# Patient Record
Sex: Female | Born: 1955 | Race: White | Hispanic: No | Smoking: Never smoker
Health system: Southern US, Community
[De-identification: ages and names within clinical notes are randomized; demographics above are authoritative.]

## PROBLEM LIST (undated history)

## (undated) DIAGNOSIS — N289 Disorder of kidney and ureter, unspecified: Secondary | ICD-10-CM

## (undated) DIAGNOSIS — E785 Hyperlipidemia, unspecified: Secondary | ICD-10-CM

## (undated) DIAGNOSIS — I10 Essential (primary) hypertension: Secondary | ICD-10-CM

## (undated) DIAGNOSIS — L0390 Cellulitis, unspecified: Secondary | ICD-10-CM

## (undated) DIAGNOSIS — F329 Major depressive disorder, single episode, unspecified: Secondary | ICD-10-CM

## (undated) DIAGNOSIS — J302 Other seasonal allergic rhinitis: Secondary | ICD-10-CM

## (undated) DIAGNOSIS — F32A Depression, unspecified: Secondary | ICD-10-CM

## (undated) DIAGNOSIS — E119 Type 2 diabetes mellitus without complications: Secondary | ICD-10-CM

## (undated) HISTORY — DX: Hyperlipidemia, unspecified: E78.5

## (undated) HISTORY — PX: BACK SURGERY: SHX140

## (undated) HISTORY — PX: LUMBAR FUSION: SHX111

## (undated) HISTORY — DX: Essential (primary) hypertension: I10

## (undated) HISTORY — DX: Depression, unspecified: F32.A

## (undated) HISTORY — PX: BREAST SURGERY: SHX581

## (undated) HISTORY — DX: Major depressive disorder, single episode, unspecified: F32.9

---

## 1997-08-22 ENCOUNTER — Ambulatory Visit (HOSPITAL_COMMUNITY): Admission: RE | Admit: 1997-08-22 | Discharge: 1997-08-22 | Payer: Self-pay | Admitting: Neurosurgery

## 1998-02-11 ENCOUNTER — Ambulatory Visit (HOSPITAL_COMMUNITY): Admission: RE | Admit: 1998-02-11 | Discharge: 1998-02-11 | Payer: Self-pay

## 1998-05-06 ENCOUNTER — Ambulatory Visit (HOSPITAL_COMMUNITY): Admission: RE | Admit: 1998-05-06 | Discharge: 1998-05-06 | Payer: Self-pay | Admitting: Gastroenterology

## 1998-06-03 ENCOUNTER — Encounter: Payer: Self-pay | Admitting: Family Medicine

## 1998-06-03 ENCOUNTER — Ambulatory Visit (HOSPITAL_COMMUNITY): Admission: RE | Admit: 1998-06-03 | Discharge: 1998-06-03 | Payer: Self-pay | Admitting: Family Medicine

## 1999-01-24 ENCOUNTER — Encounter: Payer: Self-pay | Admitting: Family Medicine

## 1999-01-24 ENCOUNTER — Ambulatory Visit (HOSPITAL_COMMUNITY): Admission: RE | Admit: 1999-01-24 | Discharge: 1999-01-24 | Payer: Self-pay | Admitting: Family Medicine

## 1999-03-11 ENCOUNTER — Encounter: Payer: Self-pay | Admitting: Allergy and Immunology

## 1999-03-11 ENCOUNTER — Encounter: Admission: RE | Admit: 1999-03-11 | Discharge: 1999-03-11 | Payer: Self-pay | Admitting: Allergy and Immunology

## 1999-03-23 ENCOUNTER — Encounter: Admission: RE | Admit: 1999-03-23 | Discharge: 1999-03-23 | Payer: Self-pay | Admitting: Allergy and Immunology

## 1999-03-23 ENCOUNTER — Encounter: Payer: Self-pay | Admitting: Allergy and Immunology

## 2002-06-27 ENCOUNTER — Encounter: Admission: RE | Admit: 2002-06-27 | Discharge: 2002-06-27 | Payer: Self-pay | Admitting: Obstetrics and Gynecology

## 2002-06-27 ENCOUNTER — Other Ambulatory Visit: Admission: RE | Admit: 2002-06-27 | Discharge: 2002-06-27 | Payer: Self-pay | Admitting: Obstetrics and Gynecology

## 2002-06-27 ENCOUNTER — Encounter: Payer: Self-pay | Admitting: Obstetrics and Gynecology

## 2002-09-16 ENCOUNTER — Emergency Department (HOSPITAL_COMMUNITY): Admission: EM | Admit: 2002-09-16 | Discharge: 2002-09-16 | Payer: Self-pay | Admitting: Emergency Medicine

## 2002-09-16 ENCOUNTER — Encounter: Payer: Self-pay | Admitting: Emergency Medicine

## 2003-01-17 ENCOUNTER — Emergency Department (HOSPITAL_COMMUNITY): Admission: EM | Admit: 2003-01-17 | Discharge: 2003-01-17 | Payer: Self-pay | Admitting: Emergency Medicine

## 2004-04-13 ENCOUNTER — Encounter: Admission: RE | Admit: 2004-04-13 | Discharge: 2004-04-13 | Payer: Self-pay | Admitting: *Deleted

## 2006-09-18 ENCOUNTER — Emergency Department (HOSPITAL_COMMUNITY): Admission: EM | Admit: 2006-09-18 | Discharge: 2006-09-18 | Payer: Self-pay | Admitting: Family Medicine

## 2006-10-25 LAB — CONVERTED CEMR LAB: Pap Smear: NORMAL

## 2006-10-31 ENCOUNTER — Encounter (INDEPENDENT_AMBULATORY_CARE_PROVIDER_SITE_OTHER): Payer: Self-pay | Admitting: General Surgery

## 2006-10-31 ENCOUNTER — Ambulatory Visit (HOSPITAL_BASED_OUTPATIENT_CLINIC_OR_DEPARTMENT_OTHER): Admission: RE | Admit: 2006-10-31 | Discharge: 2006-10-31 | Payer: Self-pay | Admitting: Internal Medicine

## 2007-08-16 ENCOUNTER — Emergency Department (HOSPITAL_COMMUNITY): Admission: EM | Admit: 2007-08-16 | Discharge: 2007-08-16 | Payer: Self-pay | Admitting: Family Medicine

## 2007-08-31 ENCOUNTER — Emergency Department (HOSPITAL_COMMUNITY): Admission: EM | Admit: 2007-08-31 | Discharge: 2007-08-31 | Payer: Self-pay | Admitting: Family Medicine

## 2008-02-06 ENCOUNTER — Ambulatory Visit: Payer: Self-pay | Admitting: Family Medicine

## 2008-02-06 DIAGNOSIS — E785 Hyperlipidemia, unspecified: Secondary | ICD-10-CM | POA: Insufficient documentation

## 2008-02-06 DIAGNOSIS — I10 Essential (primary) hypertension: Secondary | ICD-10-CM | POA: Insufficient documentation

## 2008-02-06 DIAGNOSIS — F321 Major depressive disorder, single episode, moderate: Secondary | ICD-10-CM | POA: Insufficient documentation

## 2008-02-08 ENCOUNTER — Encounter (INDEPENDENT_AMBULATORY_CARE_PROVIDER_SITE_OTHER): Payer: Self-pay | Admitting: *Deleted

## 2008-02-08 LAB — CONVERTED CEMR LAB
ALT: 39 units/L — ABNORMAL HIGH (ref 0–35)
AST: 24 units/L (ref 0–37)
Albumin: 4.4 g/dL (ref 3.5–5.2)
Alkaline Phosphatase: 90 units/L (ref 39–117)
BUN: 13 mg/dL (ref 6–23)
Bilirubin, Direct: 0.1 mg/dL (ref 0.0–0.3)
CO2: 25 meq/L (ref 19–32)
Calcium: 11 mg/dL — ABNORMAL HIGH (ref 8.4–10.5)
Chloride: 104 meq/L (ref 96–112)
Cholesterol: 441 mg/dL — ABNORMAL HIGH (ref 0–200)
Creatinine, Ser: 0.81 mg/dL (ref 0.40–1.20)
Glucose, Bld: 93 mg/dL (ref 70–99)
HCT: 41.2 % (ref 36.0–46.0)
HDL: 52 mg/dL (ref >39)
Hemoglobin: 13.8 g/dL (ref 12.0–15.0)
Indirect Bilirubin: 0.4 mg/dL (ref 0.0–0.9)
MCHC: 33.5 g/dL (ref 30.0–36.0)
MCV: 86 fL (ref 78.0–100.0)
Platelets: 234 10*3/uL (ref 150–400)
Potassium: 3.8 meq/L (ref 3.5–5.3)
RBC: 4.79 M/uL (ref 3.87–5.11)
RDW: 13.6 % (ref 11.5–15.5)
Sodium: 140 meq/L (ref 135–145)
TSH: 1.061 microintl units/mL (ref 0.350–4.50)
Total Bilirubin: 0.5 mg/dL (ref 0.3–1.2)
Total CHOL/HDL Ratio: 8.5
Total Protein: 7.2 g/dL (ref 6.0–8.3)
Triglycerides: 404 mg/dL — ABNORMAL HIGH (ref <150)
WBC: 4.7 10*3/uL (ref 4.0–10.5)

## 2008-03-28 ENCOUNTER — Telehealth (INDEPENDENT_AMBULATORY_CARE_PROVIDER_SITE_OTHER): Payer: Self-pay | Admitting: *Deleted

## 2009-02-26 ENCOUNTER — Telehealth: Payer: Self-pay | Admitting: Family Medicine

## 2009-03-17 ENCOUNTER — Ambulatory Visit: Payer: Self-pay | Admitting: Family Medicine

## 2009-04-10 ENCOUNTER — Ambulatory Visit: Payer: Self-pay | Admitting: Family Medicine

## 2010-02-19 ENCOUNTER — Telehealth (INDEPENDENT_AMBULATORY_CARE_PROVIDER_SITE_OTHER): Payer: Self-pay | Admitting: *Deleted

## 2010-02-24 ENCOUNTER — Ambulatory Visit: Payer: Self-pay | Admitting: Family Medicine

## 2010-03-26 ENCOUNTER — Ambulatory Visit: Admit: 2010-03-26 | Payer: Self-pay | Admitting: Family Medicine

## 2010-03-26 ENCOUNTER — Other Ambulatory Visit: Payer: Self-pay | Admitting: Family Medicine

## 2010-03-26 ENCOUNTER — Ambulatory Visit
Admission: RE | Admit: 2010-03-26 | Discharge: 2010-03-26 | Payer: Self-pay | Source: Home / Self Care | Attending: Family Medicine | Admitting: Family Medicine

## 2010-03-26 LAB — BASIC METABOLIC PANEL
BUN: 18 mg/dL (ref 6–23)
CO2: 29 mEq/L (ref 19–32)
Calcium: 11.6 mg/dL — ABNORMAL HIGH (ref 8.4–10.5)
Chloride: 104 mEq/L (ref 96–112)
Creatinine, Ser: 0.8 mg/dL (ref 0.4–1.2)
GFR: 82.8 mL/min (ref >60.00)
Glucose, Bld: 94 mg/dL (ref 70–99)
Potassium: 5 mEq/L (ref 3.5–5.1)
Sodium: 140 mEq/L (ref 135–145)

## 2010-03-26 LAB — HEPATIC FUNCTION PANEL
ALT: 27 U/L (ref 0–35)
AST: 20 U/L (ref 0–37)
Albumin: 4.1 g/dL (ref 3.5–5.2)
Alkaline Phosphatase: 96 U/L (ref 39–117)
Bilirubin, Direct: 0.1 mg/dL (ref 0.0–0.3)
Total Bilirubin: 0.5 mg/dL (ref 0.3–1.2)
Total Protein: 7.3 g/dL (ref 6.0–8.3)

## 2010-03-26 LAB — CBC WITH DIFFERENTIAL/PLATELET
Basophils Absolute: 0 10*3/uL (ref 0.0–0.1)
Basophils Relative: 0.5 % (ref 0.0–3.0)
Eosinophils Absolute: 0.1 10*3/uL (ref 0.0–0.7)
Eosinophils Relative: 2.2 % (ref 0.0–5.0)
HCT: 39.6 % (ref 36.0–46.0)
Hemoglobin: 13.5 g/dL (ref 12.0–15.0)
Lymphocytes Relative: 16.7 % (ref 12.0–46.0)
Lymphs Abs: 1 10*3/uL (ref 0.7–4.0)
MCHC: 34.1 g/dL (ref 30.0–36.0)
MCV: 87 fl (ref 78.0–100.0)
Monocytes Absolute: 0.5 10*3/uL (ref 0.1–1.0)
Monocytes Relative: 7.4 % (ref 3.0–12.0)
Neutro Abs: 4.5 10*3/uL (ref 1.4–7.7)
Neutrophils Relative %: 73.2 % (ref 43.0–77.0)
Platelets: 216 10*3/uL (ref 150.0–400.0)
RBC: 4.55 Mil/uL (ref 3.87–5.11)
RDW: 14.1 % (ref 11.5–14.6)
WBC: 6.2 10*3/uL (ref 4.5–10.5)

## 2010-03-26 LAB — LIPID PANEL
Cholesterol: 505 mg/dL — ABNORMAL HIGH (ref 0–200)
HDL: 74.2 mg/dL (ref >39.00)
Total CHOL/HDL Ratio: 7
Triglycerides: 482 mg/dL — ABNORMAL HIGH (ref 0.0–149.0)
VLDL: 96.4 mg/dL — ABNORMAL HIGH (ref 0.0–40.0)

## 2010-03-26 LAB — LDL CHOLESTEROL, DIRECT: Direct LDL: 176.6 mg/dL

## 2010-03-26 LAB — TSH: TSH: 1.89 u[IU]/mL (ref 0.35–5.50)

## 2010-03-31 ENCOUNTER — Encounter: Payer: Self-pay | Admitting: Family Medicine

## 2010-04-04 ENCOUNTER — Encounter: Payer: Self-pay | Admitting: *Deleted

## 2010-04-07 ENCOUNTER — Telehealth (INDEPENDENT_AMBULATORY_CARE_PROVIDER_SITE_OTHER): Payer: Self-pay | Admitting: *Deleted

## 2010-04-09 ENCOUNTER — Telehealth (INDEPENDENT_AMBULATORY_CARE_PROVIDER_SITE_OTHER): Payer: Self-pay | Admitting: *Deleted

## 2010-04-13 NOTE — Assessment & Plan Note (Signed)
Summary: new pt to est--blood pressure medication-per michelle--tl   Vital Signs:  Patient Profile:   55 Years Old Female Weight:      225.13 pounds Temp:     98.2 degrees F oral Pulse rate:   94 / minute BP sitting:   160 / 120  (left arm)  Pt. in pain?   no  Vitals Entered By: Rolla Flatten CMA (February 06, 2008 12:08 PM)                  PCP:  Etter Sjogren  Chief Complaint:  bp med.  History of Present Illness:  Hypertension Follow-Up      This is a 55 year old woman who presents for Hypertension follow-up.  The patient denies lightheadedness, urinary frequency, headaches, edema, impotence, rash, and fatigue.  Associated symptoms include chest pain.  The patient denies the following associated symptoms: chest pressure, exercise intolerance, dyspnea, palpitations, syncope, leg edema, and pedal edema.  Compliance with medications (by patient report) has been near 100%.  The patient reports that dietary compliance has been good.  The patient reports no exercise.  Adjunctive measures currently used by the patient include salt restriction.  Pt states when she does not take meds she has CP.   Pt also needs fasting labs and refills on all meds.     Current Allergies (reviewed today): No known allergies   Past Medical History:    Reviewed history and no changes required:       Hypertension       Depression       Hyperlipidemia  Past Surgical History:    Reviewed history and no changes required:       Lumbar fusion-- Dr Hal Neer X1       Lumpectomy-- L breast-- [facility]   Family History:    Family History Hypertension        Family History of Colon CA 1st degree relative at 55 yo--F  Social History:    Occupation: Oceanographer    Divorced    Never Smoked    Alcohol use-no    Drug use-no    Regular exercise-no   Risk Factors:  Tobacco use:  never Passive smoke exposure:  no Drug use:  no HIV high-risk behavior:  no Caffeine use:  1 drinks per  day Alcohol use:  no Exercise:  no Seatbelt use:  100 %  Family History Risk Factors:    Family History of MI in females < 37 years old:  no    Family History of MI in males < 67 years old:  no  Mammogram History:     Date of Last Mammogram:  10/25/2006    Results:  Normal ---bertrand   PAP Smear History:     Date of Last PAP Smear:  10/25/2006    Results:  Normal   Colonoscopy History:     Date of Last Colonoscopy:  08/26/1999    Results:  Magod---- polyps    Review of Systems      See HPI  ENT      Denies decreased hearing, difficulty swallowing, ear discharge, earache, hoarseness, nasal congestion, nosebleeds, postnasal drainage, ringing in ears, sinus pressure, and sore throat.  CV      occassional cp when off meds  Resp      Denies chest discomfort, chest pain with inspiration, cough, coughing up blood, excessive snoring, hypersomnolence, morning headaches, pleuritic, shortness of breath, sputum productive, and wheezing.  GI  Denies abdominal pain, bloody stools, change in bowel habits, constipation, dark tarry stools, diarrhea, excessive appetite, gas, hemorrhoids, indigestion, loss of appetite, nausea, vomiting, vomiting blood, and yellowish skin color.  GU      Denies abnormal vaginal bleeding, decreased libido, discharge, dysuria, genital sores, hematuria, incontinence, nocturia, urinary frequency, and urinary hesitancy.  MS      Denies joint pain, joint redness, joint swelling, loss of strength, low back pain, mid back pain, muscle aches, muscle , cramps, muscle weakness, stiffness, and thoracic pain.  Psych      Complains of anxiety.   Physical Exam  General:     Well-developed,well-nourished,in no acute distress; alert,appropriate and cooperative throughout examination Neck:     No deformities, masses, or tenderness noted.no cervical lymphadenopathy.   Lungs:     Normal respiratory effort, chest expands symmetrically. Lungs are clear to  auscultation, no crackles or wheezes. Heart:     Normal rate and regular rhythm. S1 and S2 normal without gallop, murmur, click, rub or other extra sounds. Extremities:     trace left pedal edema and trace right pedal edema.   Neurologic:     alert & oriented X3, cranial nerves II-XII intact, and strength normal in all extremities.   Skin:     Intact without suspicious lesions or rashes Cervical Nodes:     No lymphadenopathy noted Psych:     Oriented X3, memory intact for recent and remote, and normally interactive.      Impression & Recommendations:  Problem # 1:  HYPERTENSION (ICD-401.9)  Her updated medication list for this problem includes:    Atenolol 100 Mg Tabs (Atenolol) .Marland Kitchen... Take 1 tab once daily    Lisinopril-hydrochlorothiazide 10-12.5 Mg Tabs (Lisinopril-hydrochlorothiazide) .Marland Kitchen... 1 by mouth once daily  Orders: Venipuncture [id number]) TLB-Lipid Panel (80061-LIPID) TLB-BMP (Basic Metabolic Panel-BMET) (99991111) TLB-CBC Platelet - w/Differential (85025-CBCD) TLB-Hepatic/Liver Function Pnl (80076-HEPATIC) TLB-TSH (Thyroid Stimulating Hormone) (84443-TSH) EKG w/ Interpretation (93000)  BP today: 160/120   Problem # 2:  DEPRESSION (ICD-311)  Her updated medication list for this problem includes:    Zoloft 100 Mg Tabs (Sertraline hcl) .Marland Kitchen... Take 1 tabs once daily  Orders: Venipuncture [id number]) TLB-Lipid Panel (80061-LIPID) TLB-BMP (Basic Metabolic Panel-BMET) (99991111) TLB-CBC Platelet - w/Differential (85025-CBCD) TLB-Hepatic/Liver Function Pnl (80076-HEPATIC) TLB-TSH (Thyroid Stimulating Hormone) (84443-TSH) EKG w/ Interpretation (93000) Discussed treatment options, including trial of antidpressant medication. Will refer to behavioral health. Follow-up call in in 24-48 hours and recheck in 2 weeks, sooner as needed. Patient agrees to call if any worsening of symptoms or thoughts of doing harm arise. Verified that the patient has no suicidal ideation  at this time.   Problem # 3:  HYPERLIPIDEMIA (P102836.4) d/w pt diet and exercise Orders: Venipuncture [id number]) TLB-Lipid Panel (80061-LIPID) TLB-BMP (Basic Metabolic Panel-BMET) (99991111) TLB-CBC Platelet - w/Differential (85025-CBCD) TLB-Hepatic/Liver Function Pnl (80076-HEPATIC) TLB-TSH (Thyroid Stimulating Hormone) (84443-TSH) EKG w/ Interpretation (93000)   Complete Medication List: 1)  Zoloft 100 Mg Tabs (Sertraline hcl) .... Take 1 tabs once daily 2)  Atenolol 100 Mg Tabs (Atenolol) .... Take 1 tab once daily 3)  Lisinopril-hydrochlorothiazide 10-12.5 Mg Tabs (Lisinopril-hydrochlorothiazide) .Marland Kitchen.. 1 by mouth once daily  PAP Screening:    Last PAP smear:  10/25/2006  Mammogram Screening:    Last Mammogram:  10/25/2006  Osteoporosis Risk Assessment:  Risk Factors for Fracture or Low Bone Density:   Race (White or Asian):     yes   Smoking status:       never   Patient Instructions: 1)  rto 2-3 weeks   Prescriptions: ZOLOFT 100 MG TABS (SERTRALINE HCL) take 1 tabs once daily  #30 x 11   Entered and Authorized by:   Garnet Koyanagi DO   Signed by:   Garnet Koyanagi DO on 02/06/2008   Method used:   Electronically to        [id number] (retail)       [address] [place], [place]  [address]       Ph: [phone]       Fax: [phone]   RxID:   [id number] ATENOLOL 100 MG TABS (ATENOLOL) take 1 tab once daily  #30 x 2   Entered and Authorized by:   Garnet Koyanagi DO   Signed by:   Garnet Koyanagi DO on 02/06/2008   Method used:   Electronically to        [id number] (retail)       [address] [place], [place]  [address]       Ph: [phone]       Fax: [phone]   RxID:   [id number] LISINOPRIL-HYDROCHLOROTHIAZIDE 10-12.5 MG TABS (LISINOPRIL-HYDROCHLOROTHIAZIDE) 1 by mouth once daily  #30 x 2   Entered and Authorized by:   Garnet Koyanagi DO   Signed by:   Garnet Koyanagi DO on 02/06/2008   Method used:   Electronically to        [id number] (retail)       [address] [place], [place]  [address]       Ph: [phone]       Fax: [phone]   RxID:   [id number]  ]    EKG  Procedure date:  02/06/2008  Findings:      normal:     EKG  Procedure date:  02/06/2008  Findings:      normal:

## 2010-04-13 NOTE — Progress Notes (Signed)
Summary: Refill Request  Phone Note Refill Request Message from:  Patient on February 19, 2010 9:35 AM  Refills Requested: Medication #1:  ATENOLOL 100 MG TABS take 1 tab once daily   Dosage confirmed as above?Dosage Confirmed   Supply Requested: 1 month   Last Refilled: 01/13/2010 [facility] [address]  Next Appointment Scheduled: lab work on 12.14.11 Initial call taken by: Elna Breslow,  February 19, 2010 9:36 AM    Prescriptions: ATENOLOL 100 MG TABS (ATENOLOL) take 1 tab once daily  #30 Tablet x 0   Entered by:   Aron Baba CMA ([place])   Authorized by:   Garnet Koyanagi DO   Signed by:   Aron Baba CMA ([place]) on 02/19/2010   Method used:   Faxed to ...       [phone]* (retail)       [address] [place], [place]  [address]       Ph: [phone]       Fax: [phone]   RxID:   [id number]

## 2010-04-13 NOTE — Assessment & Plan Note (Signed)
Summary: BP CHECK AND MED REFILLS////SPH   Vital Signs:  Patient profile:   55 year old female Height:      71 inches Weight:      218 pounds BMI:     30.51 Temp:     99 degrees F oral Pulse rate:   90 / minute Pulse rhythm:   regular BP sitting:   130 / 84  (left arm) Cuff size:   regular  Vitals Entered By: [place] (April 10, 2009 4:35 PM) CC: Refill on meds, check BP    History of Present Illness:  Hypertension follow-up      This is a 55 year old woman who presents for Hypertension follow-up.  Pt stopped her meds because she ran out---1 week ago.   .  The patient denies lightheadedness, urinary frequency, headaches, edema, impotence, rash, and fatigue.  The patient denies the following associated symptoms: chest pain, chest pressure, exercise intolerance, dyspnea, palpitations, syncope, leg edema, and pedal edema.  The patient reports that dietary compliance has been excellent.  The patient reports exercising daily.  Adjunctive measures currently used by the patient include salt restriction.    Hyperlipidemia follow-up      The patient also presents for Hyperlipidemia follow-up.  The patient denies muscle aches, GI upset, abdominal pain, flushing, itching, constipation, diarrhea, and fatigue.  The patient denies the following symptoms: chest pain/pressure, exercise intolerance, dypsnea, palpitations, syncope, and pedal edema.  Compliance with medications (by patient report) has been intermittent.  Dietary compliance has been excellent.  The patient reports exercising daily.    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 Family History Hypertension  Family History of Colon CA 1st degree relative at 55 yo--F  Social History: Reviewed history from 02/06/2008 and no changes required. 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 Lungs:  Normal respiratory effort, chest expands symmetrically. Lungs are clear to auscultation, no crackles or wheezes. Heart:  Normal rate and regular rhythm. S1 and S2 normal without gallop, murmur, click, rub or other extra sounds. Extremities:  No clubbing, cyanosis, edema, or deformity noted with normal full range of motion of all joints.   Skin:  Intact without suspicious lesions or rashes Psych:  Cognition and judgment appear intact. Alert and cooperative with normal attention span and concentration. No apparent delusions, illusions, hallucinations   Impression & Recommendations:  Problem # 1:  HYPERLIPIDEMIA (ICD-272.4)  The following medications were removed from the medication list:    Fenofibrate 160 Mg Tabs (Fenofibrate) .Marland Kitchen... Take one tablet daily by mouth. Her updated medication list for this problem includes:    Trilipix 135 Mg Cpdr (Choline fenofibrate) .Marland Kitchen... Take 1 by mouth once daily  pt needs labs now for additional refills.  Labs Reviewed: SGOT: 24 (02/06/2008)   SGPT: 39 (02/06/2008)    HDL:52 (02/06/2008)  LDL:See Comment mg/dL (02/06/2008)  Chol:441 (02/06/2008)  Trig:404 (02/06/2008)  Problem # 2:     HYPERTENSION (ICD-401.9)  The following medications were removed from the medication list:    Lisinopril-hydrochlorothiazide 10-12.5 Mg Tabs (Lisinopril-hydrochlorothiazide) .Marland Kitchen... 1 by mouth once daily. due for office visit. Her updated medication list for this problem includes:    Atenolol 100 Mg Tabs (Atenolol) .Marland Kitchen... Take 1 tab once daily  BP today: 130/84 Prior BP: 160/120 (02/06/2008)  Labs Reviewed: K+: 3.8 (02/06/2008) Creat: : 0.81 (02/06/2008)   Chol: 441 (02/06/2008)   HDL: 52 (02/06/2008)   LDL: See Comment mg/dL (02/06/2008)   TG: 404 (02/06/2008)  Problem # 3:  DEPRESSION (ICD-311)  Her updated medication list for this problem includes:    Zoloft 100 Mg Tabs (Sertraline hcl) .Marland Kitchen... Take 1 tabs once daily  Complete Medication List: 1)  Zoloft 100 Mg Tabs (Sertraline hcl) .... Take 1 tabs once daily 2)  Atenolol 100 Mg Tabs (Atenolol) .... Take 1 tab once daily 3)  Trilipix 135 Mg Cpdr (Choline fenofibrate) .... Take 1 by mouth once daily pt needs labs now for additional refills.  Patient Instructions: 1)  275.42   272.4  401.9  bmp, hep, lipid, parathyroid-- fasting labs Prescriptions: ATENOLOL 100 MG TABS (ATENOLOL) take 1 tab once daily  #30 x 5   Entered and Authorized by:   Garnet Koyanagi DO   Signed by:   Garnet Koyanagi DO on 04/10/2009   Method used:   Electronically to        [id number] (retail)       [place], [place]  [address]       Ph: [phone]       Fax: [phone]   RxID:   [id number] ZOLOFT 100 MG TABS (SERTRALINE HCL) take 1 tabs once daily  #30 x 5   Entered and Authorized by:   Garnet Koyanagi DO   Signed by:   Garnet Koyanagi DO on 04/10/2009   Method used:   Electronically to        [id number] (retail)       [address] [place], [place]  [address]       Ph: [phone]       Fax: [phone]   RxID:   [id number]

## 2010-04-15 NOTE — Letter (Signed)
Summary: Primary Care Appointment Letter  [facility]   [place], [place] [address]   Phone: [phone]  Fax: [phone]    03/31/2010 MRN: [id number]     [place] [address] [place], [place]  [address]     Dear Ms. Bonanza,   Your Primary Care Physician Garnet Koyanagi DO has indicated that:    _______it is time to schedule an appointment.    _______you missed your appointment on______ and need to call and          reschedule.    _______you need to have lab work done.    __X_____you need to call the office to discuss lab or test results and update your phone numbers.    _______you need to call to reschedule your appointment that is                       scheduled on _________.     Please call our office as soon as possible. Our phone number is [phone]. Please press option 1. Our office is open 8a-5p, Monday through Friday.     Thank you,    Brandon

## 2010-04-15 NOTE — Progress Notes (Signed)
Summary: samples of creator and trilipex???  Phone Note Call from Patient Call back at [facility] Phone [phone]   Caller: Patient Summary of Call: per lab work notes, patient needs to take Crestor and Trilipex---do we have samples??    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

## 2010-04-15 NOTE — Assessment & Plan Note (Signed)
Summary: BP f/u//fd   Vital Signs:  Patient profile:   55 year old female Height:      71 inches Weight:      222.4 pounds BMI:     31.13 Temp:     98.7 degrees F oral Pulse rate:   60 / minute Pulse rhythm:   regular BP sitting:   144 / 92  (left arm) Cuff size:   large  Vitals Entered By: Aron Baba CMA Deborra Medina) (March 26, 2010 11:53 AM) CC: c/o elevated BP--increased stress at home//needs labs patient is fasting   History of Present Illness:  Hypertension follow-up      This is a 55 year old woman who presents for Hypertension follow-up.  Pt here c/o bp running high recently ---  pt has been under a lot of stress.   .  The patient denies lightheadedness, urinary frequency, headaches, edema, impotence, rash, and fatigue.  The patient denies the following associated symptoms: chest pain, chest pressure, exercise intolerance, dyspnea, palpitations, syncope, leg edema, and pedal edema.  Compliance with medications (by patient report) has been near 100%.  The patient reports that dietary compliance has been good.  Adjunctive measures currently used by the patient include salt restriction.    Current Medications (verified): 1)  Zoloft 100 Mg Tabs (Sertraline Hcl) .... Take 1 Tabs Once Daily 2)  Atenolol 100 Mg Tabs (Atenolol) .... Take 1 Tab Once Daily 3)  Lisinopril-Hydrochlorothiazide 10-12.5 Mg Tabs (Lisinopril-Hydrochlorothiazide) .Marland Kitchen.. 1 By Mouth Once Daily  Allergies (verified): No Known Drug Allergies  Past History:  Past medical, surgical, family and social histories (including risk factors) reviewed for relevance to current acute and chronic problems.  Past Medical History: Reviewed history from 02/06/2008 and no changes required. Hypertension Depression Hyperlipidemia  Past Surgical History: Reviewed history from 02/06/2008 and no changes required. Lumbar fusion-- Dr Hal Neer X1 Lumpectomy-- L breast-- [facility]  Family History: Reviewed history from  02/06/2008 and no changes required. Family History Hypertension Family History of Colon CA 1st degree relative at 55 yo--F Family History Depression Family History of Stroke F 1st degree relative 55 yo  Brain tumor---brother  Social History: Reviewed history from 02/06/2008 and no changes required. 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 Lungs are clear to auscultation, no crackles or wheezes. Heart:  normal rate and no murmur.   Extremities:  No clubbing, cyanosis, edema, or deformity noted with normal full range of motion of all joints.   Psych:  Oriented X3 and normally interactive.     Impression & Recommendations:  Problem # 1:  HYPERLIPIDEMIA (B2193296.4)  The following medications were removed from the medication list:    Trilipix 135 Mg Cpdr (Choline fenofibrate) .Marland Kitchen... Take 1 by mouth once daily pt needs labs now for additional refills.  Orders: Venipuncture [id number]) TLB-Lipid Panel (80061-LIPID) TLB-BMP (Basic Metabolic Panel-BMET) (99991111) TLB-CBC Platelet - w/Differential (85025-CBCD) TLB-Hepatic/Liver Function Pnl (80076-HEPATIC) TLB-TSH (Thyroid Stimulating Hormone) (84443-TSH)  Labs Reviewed: SGOT: 24 (02/06/2008)   SGPT: 39 (02/06/2008)   HDL:52 (02/06/2008)  LDL:See Comment mg/dL (02/06/2008)  Chol:441 (02/06/2008)  Trig:404 (02/06/2008)  Problem # 2:  HYPERTENSION (ICD-401.9) Assessment: Deteriorated  Her updated medication list for this problem includes:    Atenolol 100 Mg Tabs (Atenolol) .Marland Kitchen... Take 1 tab once daily  Lisinopril-hydrochlorothiazide 10-12.5 Mg Tabs (Lisinopril-hydrochlorothiazide) .Marland Kitchen... 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 Take 1 tabs once daily  Complete Medication List: 1)  Zoloft 100 Mg Tabs (Sertraline hcl) .... Take 1 tabs once daily 2)  Atenolol 100 Mg Tabs (Atenolol) .... Take 1 tab once daily 3)  Lisinopril-hydrochlorothiazide 10-12.5 Mg Tabs (Lisinopril-hydrochlorothiazide) .Marland Kitchen.. 1 by mouth once daily  Patient Instructions: 1)  Please schedule a follow-up appointment in 2 weeks.  Prescriptions: ZOLOFT 100 MG TABS (SERTRALINE HCL) take 1 tabs once daily  #30 Tablet x 11   Entered and Authorized by:   Garnet Koyanagi DO   Signed by:   Garnet Koyanagi DO on 03/26/2010   Method used:   Electronically to        [id number] (retail)       [address] [place], [place]  [address]       Ph: [phone]       Fax: [phone]   RxID:   [id number] ATENOLOL 100 MG TABS (ATENOLOL) take 1 tab once daily  #30 Tablet x 2   Entered and Authorized by:   Garnet Koyanagi DO   Signed by:   Garnet Koyanagi DO on 03/26/2010   Method used:   Electronically to        [facility] (retail)       [address] [place], [place]  [address]       Ph: [phone]       Fax: [phone]   RxID:   [id number] LISINOPRIL-HYDROCHLOROTHIAZIDE 10-12.5 MG TABS (LISINOPRIL-HYDROCHLOROTHIAZIDE) 1 by mouth once daily  #30 x 2   Entered and Authorized by:   Garnet Koyanagi DO   Signed by:   Garnet Koyanagi DO on 03/26/2010   Method used:   Electronically to        [facility] (retail)       [address] [place], [place]  [address]       Ph: [phone]       Fax: [phone]   RxID:   [id number]    Orders Added: 1)  Venipuncture [id number] 2)  TLB-Lipid Panel [80061-LIPID] 3)  TLB-BMP (Basic Metabolic Panel-BMET) 123456 4)  TLB-CBC Platelet - w/Differential [85025-CBCD] 5)  TLB-Hepatic/Liver Function Pnl [80076-HEPATIC] 6)  TLB-TSH (Thyroid Stimulating Hormone) [84443-TSH] 7)  Est. Patient Level III [id number]

## 2010-04-15 NOTE — Progress Notes (Signed)
Summary: LABS RESULT  Phone Note Call from Patient   Caller: Patient Summary of Call: PT IS CALLING ABOUT HER LAB RESULT--PH--[phone]  Follow-up for Phone Call        Cholesterol  VERY high!!!  Ideally your LDL (bad cholesterol) should be <_100___, your HDL (good cholesterol) should be >__40_ and your triglycerides should be< 150.  Diet and exercise will increase HDL and decrease the LDL and triglycerides. Read Dr. Langston Masker book--Eat Drink and Be Healthy.  We will recheck labs in _3_ months.   Start Crestor 10 m #30  1 by mouth  at bedtime ,  2 refills   ----also restart trilipix 135 mg  #30 1 by mouth once daily ,  2 refills-----272.4  lipid, hep, bmp    Additional Follow-up for Phone Call Additional follow up Details #1::        pt aware of the above, stated she had been having chest pain and wanted to know if it was related to her elevated chol, I advised her cholesterol can cause her to be at a higher risk for heart attack and stroke but I am not sure if the symptoms she is having are related to the cholesterol being high, I advised she will need to come in to be seen or go to the ER asap for eval, she declined both, wanted me to give her individual figures of her labs, I ontinue and advise I can send meds to pharmacy, she wanted them to go to [facility].Deborah KitchenMarland KitchenPt wanted to continue to disuss labs, I advised she can shedule a f/u with Dr.Lowne because she can further go into details on labs and meds, pt declined.....Deborah Figueroa call ended Additional Follow-up by: Aron Baba CMA Deborra Medina),  April 08, 2010 8:44 AM    New/Updated Medications: CRESTOR 10 MG TABS (ROSUVASTATIN CALCIUM) 1 by mouth once daily TRILIPIX 135 MG CPDR (CHOLINE FENOFIBRATE) 1 by mouth once daily Prescriptions: TRILIPIX 135 MG CPDR (CHOLINE FENOFIBRATE) 1 by mouth once daily  #30 x 2   Entered by:   Aron Baba CMA ([place])   Authorized by:   Garnet Koyanagi DO   Signed by:   Aron Baba CMA ([place]) on 04/08/2010   Method used:    Faxed to ...       [facility] [phone]* (retail)       [place], [place]  [address]       Ph: [phone]       Fax: [phone]   RxID:   [id number] CRESTOR 10 MG TABS (ROSUVASTATIN CALCIUM) 1 by mouth once daily  #30 x 2   Entered by:   Aron Baba CMA ([place])   Authorized by:   Garnet Koyanagi DO   Signed by:   Aron Baba CMA ([place]) on 04/08/2010   Method used:   Faxed to ...       [facility] [phone]* (retail)       [address] [place],   [address]       Ph: [phone]       Fax: [phone]   RxID:   [id number]

## 2010-07-02 ENCOUNTER — Telehealth: Payer: Self-pay | Admitting: Family Medicine

## 2010-07-02 NOTE — Telephone Encounter (Signed)
Samples left at  Check in.   VM left making patient aware      KP

## 2010-07-02 NOTE — Telephone Encounter (Signed)
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      She says it is [place] for son Deborah Figueroa to pick these up for her

## 2010-07-22 ENCOUNTER — Other Ambulatory Visit: Payer: Self-pay | Admitting: Family Medicine
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
## 2010-11-22 ENCOUNTER — Telehealth: Payer: Self-pay | Admitting: Family Medicine

## 2010-11-22 NOTE — Telephone Encounter (Signed)
ERROR

## 2010-12-09 LAB — DIFFERENTIAL
Basophils Absolute: 0
Basophils Relative: 0
Eosinophils Absolute: 0.2
Eosinophils Relative: 3
Lymphocytes Relative: 26
Lymphs Abs: 1.4
Monocytes Absolute: 0.4
Monocytes Relative: 8
Neutro Abs: 3.4
Neutrophils Relative %: 63

## 2010-12-09 LAB — POCT CARDIAC MARKERS
CKMB, poc: 1.3
CKMB, poc: <1 — ABNORMAL LOW
Myoglobin, poc: 85
Myoglobin, poc: 98.2
Operator id: 270651
Operator id: 270651
Troponin i, poc: <0.05
Troponin i, poc: <0.05

## 2010-12-09 LAB — POCT I-STAT, CHEM 8
BUN: 15
Calcium, Ion: 1.33 — ABNORMAL HIGH
Chloride: 104
Creatinine, Ser: 1.1
Glucose, Bld: 96
HCT: 40
Hemoglobin: 13.6
Potassium: 3.7
Sodium: 138
TCO2: 29

## 2010-12-09 LAB — CBC
HCT: 39.3
Hemoglobin: 14
MCHC: 35.5
MCV: 86.2
Platelets: 190
RBC: 4.57
RDW: 14.1
WBC: 5.4

## 2010-12-24 LAB — COMPREHENSIVE METABOLIC PANEL
ALT: 32
AST: 29
Albumin: 3.7
Alkaline Phosphatase: 79
BUN: 11
CO2: 31
Calcium: 9.9
Chloride: 103
Creatinine, Ser: 0.75
GFR calc Af Amer: >60
GFR calc non Af Amer: >60
Glucose, Bld: 101 — ABNORMAL HIGH
Potassium: 4.4
Sodium: 139
Total Bilirubin: 1.4 — ABNORMAL HIGH
Total Protein: 6.8

## 2010-12-24 LAB — DIFFERENTIAL
Basophils Absolute: 0
Basophils Relative: 0
Eosinophils Absolute: 0.1
Eosinophils Relative: 2
Lymphocytes Relative: 19
Lymphs Abs: 1
Monocytes Absolute: 0.3
Monocytes Relative: 7
Neutro Abs: 3.9
Neutrophils Relative %: 73

## 2010-12-24 LAB — CBC
HCT: 37.7
Hemoglobin: 13.1
MCHC: 34.8
MCV: 85
Platelets: 238
RBC: 4.43
RDW: 13.9
WBC: 5.3

## 2011-03-01 ENCOUNTER — Telehealth: Payer: Self-pay | Admitting: Family Medicine

## 2011-03-01 NOTE — Telephone Encounter (Signed)
Pt way overdue for labs and ov--272.4, 401.9  Lipid, hep, bmp , ua

## 2011-03-01 NOTE — Telephone Encounter (Signed)
Refill atenonol - lisinopril - [facility] - [phone]

## 2011-03-01 NOTE — Telephone Encounter (Signed)
Last seen and labs done 1/12 .     Please advise     KP

## 2011-03-01 NOTE — Telephone Encounter (Signed)
Patient wants samples of trilipid - crestor

## 2011-03-02 MED ORDER — LISINOPRIL-HYDROCHLOROTHIAZIDE 10-12.5 MG PO TABS: 1.0000 | ORAL_TABLET | Freq: Every day | ORAL | Status: DC

## 2011-03-02 MED ORDER — SERTRALINE HCL 100 MG PO TABS: 100.0000 mg | ORAL_TABLET | Freq: Every day | ORAL | Status: DC

## 2011-03-02 MED ORDER — ATENOLOL 100 MG PO TABS: 100.0000 mg | ORAL_TABLET | Freq: Every day | ORAL | Status: DC

## 2011-03-02 NOTE — Telephone Encounter (Signed)
Discussed with patient and Scheduled apt for 03/17/10 at 9 am and Rx faxed to the patient     KP

## 2011-03-10 ENCOUNTER — Encounter: Payer: Self-pay | Admitting: Family Medicine

## 2011-03-18 ENCOUNTER — Encounter (INDEPENDENT_AMBULATORY_CARE_PROVIDER_SITE_OTHER): Payer: Self-pay | Admitting: Family Medicine

## 2011-03-18 NOTE — Progress Notes (Signed)
This encounter was created in error - please disregard.

## 2011-03-31 ENCOUNTER — Ambulatory Visit: Payer: Self-pay | Admitting: Family Medicine

## 2011-03-31 DIAGNOSIS — Z0289 Encounter for other administrative examinations: Secondary | ICD-10-CM

## 2011-06-08 ENCOUNTER — Telehealth: Payer: Self-pay | Admitting: Family Medicine

## 2011-06-08 MED ORDER — ATENOLOL 100 MG PO TABS: 100.0000 mg | ORAL_TABLET | Freq: Every day | ORAL | Status: DC

## 2011-06-08 NOTE — Telephone Encounter (Signed)
Please call patient regarding medications, she states she is completely out of tenormin & will be out of all her other medications soon. I did schedule her an appointment to come in tomorrow at 10:30am to see dr.lowne, but she wants to come in today to do lab work & call back to see dr.lowne at another time. Patient ph# [phone]

## 2011-06-08 NOTE — Telephone Encounter (Signed)
Can't reach patient at number she gave me or any other number.

## 2011-06-08 NOTE — Telephone Encounter (Signed)
If patient can she needs to keep her apt for tomorrow-- Rx faxed    KP

## 2011-06-09 ENCOUNTER — Ambulatory Visit (INDEPENDENT_AMBULATORY_CARE_PROVIDER_SITE_OTHER): Payer: Self-pay | Admitting: Family Medicine

## 2011-06-09 ENCOUNTER — Encounter: Payer: Self-pay | Admitting: Family Medicine

## 2011-06-09 ENCOUNTER — Telehealth: Payer: Self-pay | Admitting: Family Medicine

## 2011-06-09 VITALS — BP 130/84 | HR 73 | Temp 98.7°F | Ht 69.5 in | Wt 208.6 lb

## 2011-06-09 DIAGNOSIS — F32A Depression, unspecified: Secondary | ICD-10-CM

## 2011-06-09 DIAGNOSIS — I10 Essential (primary) hypertension: Secondary | ICD-10-CM

## 2011-06-09 DIAGNOSIS — F329 Major depressive disorder, single episode, unspecified: Secondary | ICD-10-CM

## 2011-06-09 DIAGNOSIS — R319 Hematuria, unspecified: Secondary | ICD-10-CM

## 2011-06-09 DIAGNOSIS — E785 Hyperlipidemia, unspecified: Secondary | ICD-10-CM

## 2011-06-09 DIAGNOSIS — F3289 Other specified depressive episodes: Secondary | ICD-10-CM

## 2011-06-09 DIAGNOSIS — Z0000 Encounter for general adult medical examination without abnormal findings: Secondary | ICD-10-CM

## 2011-06-09 LAB — BASIC METABOLIC PANEL
BUN: 22 mg/dL (ref 6–23)
CO2: 26 mEq/L (ref 19–32)
Calcium: 11.4 mg/dL — ABNORMAL HIGH (ref 8.4–10.5)
Chloride: 105 mEq/L (ref 96–112)
Creatinine, Ser: 0.8 mg/dL (ref 0.4–1.2)
GFR: 83.69 mL/min (ref >60.00)
Glucose, Bld: 95 mg/dL (ref 70–99)
Potassium: 3.1 mEq/L — ABNORMAL LOW (ref 3.5–5.1)
Sodium: 139 mEq/L (ref 135–145)

## 2011-06-09 LAB — HEPATIC FUNCTION PANEL
ALT: 19 U/L (ref 0–35)
AST: 16 U/L (ref 0–37)
Albumin: 4.1 g/dL (ref 3.5–5.2)
Alkaline Phosphatase: 80 U/L (ref 39–117)
Bilirubin, Direct: 0 mg/dL (ref 0.0–0.3)
Total Bilirubin: 0.2 mg/dL — ABNORMAL LOW (ref 0.3–1.2)
Total Protein: 7.4 g/dL (ref 6.0–8.3)

## 2011-06-09 LAB — LIPID PANEL
Cholesterol: 379 mg/dL — ABNORMAL HIGH (ref 0–200)
HDL: 65.5 mg/dL (ref >39.00)
Total CHOL/HDL Ratio: 6
Triglycerides: 219 mg/dL — ABNORMAL HIGH (ref 0.0–149.0)
VLDL: 43.8 mg/dL — ABNORMAL HIGH (ref 0.0–40.0)

## 2011-06-09 LAB — CBC WITH DIFFERENTIAL/PLATELET
Basophils Absolute: 0 10*3/uL (ref 0.0–0.1)
Basophils Relative: 0.6 % (ref 0.0–3.0)
Eosinophils Absolute: 0 10*3/uL (ref 0.0–0.7)
Eosinophils Relative: 1 % (ref 0.0–5.0)
HCT: 34.7 % — ABNORMAL LOW (ref 36.0–46.0)
Hemoglobin: 11.9 g/dL — ABNORMAL LOW (ref 12.0–15.0)
Lymphocytes Relative: 18.9 % (ref 12.0–46.0)
Lymphs Abs: 0.9 10*3/uL (ref 0.7–4.0)
MCHC: 34.5 g/dL (ref 30.0–36.0)
MCV: 85.5 fl (ref 78.0–100.0)
Monocytes Absolute: 0.2 10*3/uL (ref 0.1–1.0)
Monocytes Relative: 4.6 % (ref 3.0–12.0)
Neutro Abs: 3.6 10*3/uL (ref 1.4–7.7)
Neutrophils Relative %: 74.9 % (ref 43.0–77.0)
Platelets: 195 10*3/uL (ref 150.0–400.0)
RBC: 4.05 Mil/uL (ref 3.87–5.11)
RDW: 14 % (ref 11.5–14.6)
WBC: 4.8 10*3/uL (ref 4.5–10.5)

## 2011-06-09 LAB — POCT URINALYSIS DIPSTICK
Bilirubin, UA: NEGATIVE
Glucose, UA: NEGATIVE
Ketones, UA: NEGATIVE
Nitrite, UA: NEGATIVE
Protein, UA: NEGATIVE
Spec Grav, UA: 1.025
Urobilinogen, UA: 0.2
pH, UA: 5

## 2011-06-09 LAB — LDL CHOLESTEROL, DIRECT: Direct LDL: 156.5 mg/dL

## 2011-06-09 LAB — TSH: TSH: 0.56 u[IU]/mL (ref 0.35–5.50)

## 2011-06-09 MED ORDER — SERTRALINE HCL 100 MG PO TABS: 100.0000 mg | ORAL_TABLET | Freq: Every day | ORAL | Status: DC

## 2011-06-09 MED ORDER — ATENOLOL 100 MG PO TABS: 100.0000 mg | ORAL_TABLET | Freq: Every day | ORAL | Status: DC

## 2011-06-09 MED ORDER — ROSUVASTATIN CALCIUM 10 MG PO TABS: 10.0000 mg | ORAL_TABLET | Freq: Every day | ORAL | Status: DC

## 2011-06-09 MED ORDER — CHOLINE FENOFIBRATE 135 MG PO CPDR: 135.0000 mg | DELAYED_RELEASE_CAPSULE | Freq: Every day | ORAL | Status: DC

## 2011-06-09 MED ORDER — LISINOPRIL-HYDROCHLOROTHIAZIDE 10-12.5 MG PO TABS: 1.0000 | ORAL_TABLET | Freq: Every day | ORAL | Status: DC

## 2011-06-09 NOTE — Assessment & Plan Note (Signed)
Stable , con't meds  

## 2011-06-09 NOTE — Telephone Encounter (Signed)
Ok to give samples----she can also go online to drug company and see if she qualifies to get if for free

## 2011-06-09 NOTE — Progress Notes (Signed)
Addended by: Modena Morrow D on: 06/09/2011 04:05 PM   Modules accepted: Orders

## 2011-06-09 NOTE — Assessment & Plan Note (Signed)
Check labs Restart meds--pt has been out a long time

## 2011-06-09 NOTE — Telephone Encounter (Signed)
Patient has several questions regarding her visit today 1-samples for Medications needed now or wait on labs to come back   2-said she was made recommendations & wants to know how to go about getting those since she has no insurance. Please call at [phone]

## 2011-06-09 NOTE — Telephone Encounter (Signed)
Patient requesting samples of her medications due to no insurance. Please advise    KP
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
## 2011-06-09 NOTE — Patient Instructions (Signed)
Preventive Care for Adults, Female A healthy lifestyle and preventive care can promote health and wellness. Preventive health guidelines for women include the following key practices.  A routine yearly physical is a good way to check with your caregiver about your health and preventive screening. It is a chance to share any concerns and updates on your health, and to receive a thorough exam.   Visit your dentist for a routine exam and preventive care every 6 months. Brush your teeth twice a day and floss once a day. Good oral hygiene prevents tooth decay and gum disease.   The frequency of eye exams is based on your age, health, family medical history, use of contact lenses, and other factors. Follow your caregiver's recommendations for frequency of eye exams.   Eat a healthy diet. Foods like vegetables, fruits, whole grains, low-fat dairy products, and lean protein foods contain the nutrients you need without too many calories. Decrease your intake of foods high in solid fats, added sugars, and salt. Eat the right amount of calories for you.Get information about a proper diet from your caregiver, if necessary.   Regular physical exercise is one of the most important things you can do for your health. Most adults should get at least 150 minutes of moderate-intensity exercise (any activity that increases your heart rate and causes you to sweat) each week. In addition, most adults need muscle-strengthening exercises on 2 or more days a week.   Maintain a healthy weight. The body mass index (BMI) is a screening tool to identify possible weight problems. It provides an estimate of body fat based on height and weight. Your caregiver can help determine your BMI, and can help you achieve or maintain a healthy weight.For adults 20 years and older:   A BMI below 18.5 is considered underweight.   A BMI of 18.5 to 24.9 is normal.   A BMI of 25 to 29.9 is considered overweight.   A BMI of 30 and above is  considered obese.   Maintain normal blood lipids and cholesterol levels by exercising and minimizing your intake of saturated fat. Eat a balanced diet with plenty of fruit and vegetables. Blood tests for lipids and cholesterol should begin at age 20 and be repeated every 5 years. If your lipid or cholesterol levels are high, you are over 50, or you are at high risk for heart disease, you may need your cholesterol levels checked more frequently.Ongoing high lipid and cholesterol levels should be treated with medicines if diet and exercise are not effective.   If you smoke, find out from your caregiver how to quit. If you do not use tobacco, do not start.   If you are pregnant, do not drink alcohol. If you are breastfeeding, be very cautious about drinking alcohol. If you are not pregnant and choose to drink alcohol, do not exceed 1 drink per day. One drink is considered to be 12 ounces (355 mL) of beer, 5 ounces (148 mL) of wine, or 1.5 ounces (44 mL) of liquor.   Avoid use of street drugs. Do not share needles with anyone. Ask for help if you need support or instructions about stopping the use of drugs.   High blood pressure causes heart disease and increases the risk of stroke. Your blood pressure should be checked at least every 1 to 2 years. Ongoing high blood pressure should be treated with medicines if weight loss and exercise are not effective.   If you are 55 to 56   years old, ask your caregiver if you should take aspirin to prevent strokes.   Diabetes screening involves taking a blood sample to check your fasting blood sugar level. This should be done once every 3 years, after age 45, if you are within normal weight and without risk factors for diabetes. Testing should be considered at a younger age or be carried out more frequently if you are overweight and have at least 1 risk factor for diabetes.   Breast cancer screening is essential preventive care for women. You should practice "breast  self-awareness." This means understanding the normal appearance and feel of your breasts and may include breast self-examination. Any changes detected, no matter how small, should be reported to a caregiver. Women in their 20s and 30s should have a clinical breast exam (CBE) by a caregiver as part of a regular health exam every 1 to 3 years. After age 40, women should have a CBE every year. Starting at age 40, women should consider having a mammography (breast X-ray test) every year. Women who have a family history of breast cancer should talk to their caregiver about genetic screening. Women at a high risk of breast cancer should talk to their caregivers about having magnetic resonance imaging (MRI) and a mammography every year.   The Pap test is a screening test for cervical cancer. A Pap test can show cell changes on the cervix that might become cervical cancer if left untreated. A Pap test is a procedure in which cells are obtained and examined from the lower end of the uterus (cervix).   Women should have a Pap test starting at age 21.   Between ages 21 and 29, Pap tests should be repeated every 2 years.   Beginning at age 30, you should have a Pap test every 3 years as long as the past 3 Pap tests have been normal.   Some women have medical problems that increase the chance of getting cervical cancer. Talk to your caregiver about these problems. It is especially important to talk to your caregiver if a new problem develops soon after your last Pap test. In these cases, your caregiver may recommend more frequent screening and Pap tests.   The above recommendations are the same for women who have or have not gotten the vaccine for human papillomavirus (HPV).   If you had a hysterectomy for a problem that was not cancer or a condition that could lead to cancer, then you no longer need Pap tests. Even if you no longer need a Pap test, a regular exam is a good idea to make sure no other problems are  starting.   If you are between ages 65 and 70, and you have had normal Pap tests going back 10 years, you no longer need Pap tests. Even if you no longer need a Pap test, a regular exam is a good idea to make sure no other problems are starting.   If you have had past treatment for cervical cancer or a condition that could lead to cancer, you need Pap tests and screening for cancer for at least 20 years after your treatment.   If Pap tests have been discontinued, risk factors (such as a new sexual partner) need to be reassessed to determine if screening should be resumed.   The HPV test is an additional test that may be used for cervical cancer screening. The HPV test looks for the virus that can cause the cell changes on the cervix.   The cells collected during the Pap test can be tested for HPV. The HPV test could be used to screen women aged 30 years and older, and should be used in women of any age who have unclear Pap test results. After the age of 30, women should have HPV testing at the same frequency as a Pap test.   Colorectal cancer can be detected and often prevented. Most routine colorectal cancer screening begins at the age of 50 and continues through age 75. However, your caregiver may recommend screening at an earlier age if you have risk factors for colon cancer. On a yearly basis, your caregiver may provide home test kits to check for hidden blood in the stool. Use of a small camera at the end of a tube, to directly examine the colon (sigmoidoscopy or colonoscopy), can detect the earliest forms of colorectal cancer. Talk to your caregiver about this at age 50, when routine screening begins. Direct examination of the colon should be repeated every 5 to 10 years through age 75, unless early forms of pre-cancerous polyps or small growths are found.   Hepatitis C blood testing is recommended for all people born from 1945 through 1965 and any individual with known risks for hepatitis C.    Practice safe sex. Use condoms and avoid high-risk sexual practices to reduce the spread of sexually transmitted infections (STIs). STIs include gonorrhea, chlamydia, syphilis, trichomonas, herpes, HPV, and human immunodeficiency virus (HIV). Herpes, HIV, and HPV are viral illnesses that have no cure. They can result in disability, cancer, and death. Sexually active women aged 25 and younger should be checked for chlamydia. Older women with new or multiple partners should also be tested for chlamydia. Testing for other STIs is recommended if you are sexually active and at increased risk.   Osteoporosis is a disease in which the bones lose minerals and strength with aging. This can result in serious bone fractures. The risk of osteoporosis can be identified using a bone density scan. Women ages 65 and over and women at risk for fractures or osteoporosis should discuss screening with their caregivers. Ask your caregiver whether you should take a calcium supplement or vitamin D to reduce the rate of osteoporosis.   Menopause can be associated with physical symptoms and risks. Hormone replacement therapy is available to decrease symptoms and risks. You should talk to your caregiver about whether hormone replacement therapy is right for you.   Use sunscreen with sun protection factor (SPF) of 30 or more. Apply sunscreen liberally and repeatedly throughout the day. You should seek shade when your shadow is shorter than you. Protect yourself by wearing long sleeves, pants, a wide-brimmed hat, and sunglasses year round, whenever you are outdoors.   Once a month, do a whole body skin exam, using a mirror to look at the skin on your back. Notify your caregiver of new moles, moles that have irregular borders, moles that are larger than a pencil eraser, or moles that have changed in shape or color.   Stay current with required immunizations.   Influenza. You need a dose every fall (or winter). The composition of  the flu vaccine changes each year, so being vaccinated once is not enough.   Pneumococcal polysaccharide. You need 1 to 2 doses if you smoke cigarettes or if you have certain chronic medical conditions. You need 1 dose at age 65 (or older) if you have never been vaccinated.   Tetanus, diphtheria, pertussis (Tdap, Td). Get 1 dose of   Tdap vaccine if you are younger than age 65, are over 65 and have contact with an infant, are a healthcare worker, are pregnant, or simply want to be protected from whooping cough. After that, you need a Td booster dose every 10 years. Consult your caregiver if you have not had at least 3 tetanus and diphtheria-containing shots sometime in your life or have a deep or dirty wound.   HPV. You need this vaccine if you are a woman age 26 or younger. The vaccine is given in 3 doses over 6 months.   Measles, mumps, rubella (MMR). You need at least 1 dose of MMR if you were born in 1957 or later. You may also need a second dose.   Meningococcal. If you are age 19 to 21 and a first-year college student living in a residence hall, or have one of several medical conditions, you need to get vaccinated against meningococcal disease. You may also need additional booster doses.   Zoster (shingles). If you are age 60 or older, you should get this vaccine.   Varicella (chickenpox). If you have never had chickenpox or you were vaccinated but received only 1 dose, talk to your caregiver to find out if you need this vaccine.   Hepatitis A. You need this vaccine if you have a specific risk factor for hepatitis A virus infection or you simply wish to be protected from this disease. The vaccine is usually given as 2 doses, 6 to 18 months apart.   Hepatitis B. You need this vaccine if you have a specific risk factor for hepatitis B virus infection or you simply wish to be protected from this disease. The vaccine is given in 3 doses, usually over 6 months.  Preventive Services /  Frequency Ages 19 to 39  Blood pressure check.** / Every 1 to 2 years.   Lipid and cholesterol check.** / Every 5 years beginning at age 20.   Clinical breast exam.** / Every 3 years for women in their 20s and 30s.   Pap test.** / Every 2 years from ages 21 through 29. Every 3 years starting at age 30 through age 65 or 70 with a history of 3 consecutive normal Pap tests.   HPV screening.** / Every 3 years from ages 30 through ages 65 to 70 with a history of 3 consecutive normal Pap tests.   Hepatitis C blood test.** / For any individual with known risks for hepatitis C.   Skin self-exam. / Monthly.   Influenza immunization.** / Every year.   Pneumococcal polysaccharide immunization.** / 1 to 2 doses if you smoke cigarettes or if you have certain chronic medical conditions.   Tetanus, diphtheria, pertussis (Tdap, Td) immunization. / A one-time dose of Tdap vaccine. After that, you need a Td booster dose every 10 years.   HPV immunization. / 3 doses over 6 months, if you are 26 and younger.   Measles, mumps, rubella (MMR) immunization. / You need at least 1 dose of MMR if you were born in 1957 or later. You may also need a second dose.   Meningococcal immunization. / 1 dose if you are age 19 to 21 and a first-year college student living in a residence hall, or have one of several medical conditions, you need to get vaccinated against meningococcal disease. You may also need additional booster doses.   Varicella immunization.** / Consult your caregiver.   Hepatitis A immunization.** / Consult your caregiver. 2 doses, 6 to 18 months   apart.   Hepatitis B immunization.** / Consult your caregiver. 3 doses usually over 6 months.  Ages 40 to 64  Blood pressure check.** / Every 1 to 2 years.   Lipid and cholesterol check.** / Every 5 years beginning at age 20.   Clinical breast exam.** / Every year after age 40.   Mammogram.** / Every year beginning at age 40 and continuing for as  long as you are in good health. Consult with your caregiver.   Pap test.** / Every 3 years starting at age 30 through age 65 or 70 with a history of 3 consecutive normal Pap tests.   HPV screening.** / Every 3 years from ages 30 through ages 65 to 70 with a history of 3 consecutive normal Pap tests.   Fecal occult blood test (FOBT) of stool. / Every year beginning at age 50 and continuing until age 75. You may not need to do this test if you get a colonoscopy every 10 years.   Flexible sigmoidoscopy or colonoscopy.** / Every 5 years for a flexible sigmoidoscopy or every 10 years for a colonoscopy beginning at age 50 and continuing until age 75.   Hepatitis C blood test.** / For all people born from 1945 through 1965 and any individual with known risks for hepatitis C.   Skin self-exam. / Monthly.   Influenza immunization.** / Every year.   Pneumococcal polysaccharide immunization.** / 1 to 2 doses if you smoke cigarettes or if you have certain chronic medical conditions.   Tetanus, diphtheria, pertussis (Tdap, Td) immunization.** / A one-time dose of Tdap vaccine. After that, you need a Td booster dose every 10 years.   Measles, mumps, rubella (MMR) immunization. / You need at least 1 dose of MMR if you were born in 1957 or later. You may also need a second dose.   Varicella immunization.** / Consult your caregiver.   Meningococcal immunization.** / Consult your caregiver.   Hepatitis A immunization.** / Consult your caregiver. 2 doses, 6 to 18 months apart.   Hepatitis B immunization.** / Consult your caregiver. 3 doses, usually over 6 months.  Ages 65 and over  Blood pressure check.** / Every 1 to 2 years.   Lipid and cholesterol check.** / Every 5 years beginning at age 20.   Clinical breast exam.** / Every year after age 40.   Mammogram.** / Every year beginning at age 40 and continuing for as long as you are in good health. Consult with your caregiver.   Pap test.** /  Every 3 years starting at age 30 through age 65 or 70 with a 3 consecutive normal Pap tests. Testing can be stopped between 65 and 70 with 3 consecutive normal Pap tests and no abnormal Pap or HPV tests in the past 10 years.   HPV screening.** / Every 3 years from ages 30 through ages 65 or 70 with a history of 3 consecutive normal Pap tests. Testing can be stopped between 65 and 70 with 3 consecutive normal Pap tests and no abnormal Pap or HPV tests in the past 10 years.   Fecal occult blood test (FOBT) of stool. / Every year beginning at age 50 and continuing until age 75. You may not need to do this test if you get a colonoscopy every 10 years.   Flexible sigmoidoscopy or colonoscopy.** / Every 5 years for a flexible sigmoidoscopy or every 10 years for a colonoscopy beginning at age 50 and continuing until age 75.   Hepatitis   C blood test.** / For all people born from 1945 through 1965 and any individual with known risks for hepatitis C.   Osteoporosis screening.** / A one-time screening for women ages 65 and over and women at risk for fractures or osteoporosis.   Skin self-exam. / Monthly.   Influenza immunization.** / Every year.   Pneumococcal polysaccharide immunization.** / 1 dose at age 65 (or older) if you have never been vaccinated.   Tetanus, diphtheria, pertussis (Tdap, Td) immunization. / A one-time dose of Tdap vaccine if you are over 65 and have contact with an infant, are a healthcare worker, or simply want to be protected from whooping cough. After that, you need a Td booster dose every 10 years.   Varicella immunization.** / Consult your caregiver.   Meningococcal immunization.** / Consult your caregiver.   Hepatitis A immunization.** / Consult your caregiver. 2 doses, 6 to 18 months apart.   Hepatitis B immunization.** / Check with your caregiver. 3 doses, usually over 6 months.  ** Family history and personal history of risk and conditions may change your caregiver's  recommendations. Document Released: 04/26/2001 Document Revised: 02/17/2011 Document Reviewed: 07/26/2010 [employer] Patient Information 2012 [employer]. 

## 2011-06-09 NOTE — Assessment & Plan Note (Signed)
Cont meds   

## 2011-06-10 LAB — HIV ANTIBODY (ROUTINE TESTING W REFLEX): HIV: NONREACTIVE

## 2011-06-13 LAB — URINE CULTURE: Colony Count: >=100000

## 2011-06-13 NOTE — Telephone Encounter (Signed)
msg left to call the office to review labs-    KP

## 2011-06-17 NOTE — Telephone Encounter (Signed)
If she has no insurance ---we can do lipitor --- 10 mg #30 1 po qhs--- its generic----she can also talk to [facility] about sliding scale as far as labs / Ov etc ----so she doesn't stress about charges for ov etc

## 2011-06-17 NOTE — Telephone Encounter (Signed)
+   uti---cipro 500 mg 1 po bid for 5 days  K is low---- kcl 20 meq daily---- recheck 2 weeks  Pt is slightly anemic--- mvi with iron Cholesterol--- LDL goal < 100, HDL >40, TG < 150. Diet and exercise will increase HDL and decrease LDL and TG. Fish, Fish Oil, Flaxseed oil will also help increase the HDL and decrease Triglycerides. Recheck labs in 3 months.---- Is she taking crestor? If yes increase to 20 mg #30 2 refills and con't fenofibrate--------272.4 Lipid, hep 285.9 Cbcd, ibc, ferritin bmp

## 2011-06-17 NOTE — Telephone Encounter (Signed)
msg left to call the office     KP 

## 2011-06-22 MED ORDER — CIPROFLOXACIN HCL 500 MG PO TABS: 500.0000 mg | ORAL_TABLET | Freq: Two times a day (BID) | ORAL | Status: AC

## 2011-06-22 MED ORDER — POTASSIUM CHLORIDE CRYS ER 20 MEQ PO TBCR: 20.0000 meq | EXTENDED_RELEASE_TABLET | Freq: Every day | ORAL | Status: DC

## 2011-06-22 MED ORDER — ATORVASTATIN CALCIUM 10 MG PO TABS: 10.0000 mg | ORAL_TABLET | Freq: Every day | ORAL | Status: DC

## 2011-06-22 NOTE — Telephone Encounter (Signed)
Addended by: Ewing Schlein on: 06/22/2011 10:12 AM   Modules accepted: Orders

## 2011-06-22 NOTE — Telephone Encounter (Signed)
Discussed with patient and she voiced understanding--- Rx faxed and samples left at check in.      KP

## 2011-08-25 ENCOUNTER — Telehealth: Payer: Self-pay | Admitting: Family Medicine

## 2011-08-25 DIAGNOSIS — I10 Essential (primary) hypertension: Secondary | ICD-10-CM

## 2011-08-25 MED ORDER — LISINOPRIL-HYDROCHLOROTHIAZIDE 10-12.5 MG PO TABS: 1.0000 | ORAL_TABLET | Freq: Every day | ORAL | Status: DC

## 2011-08-25 NOTE — Telephone Encounter (Signed)
1-Last labs dated 3.28.13, patient should have come back in two wks, not sure if she needs more labs now to get refill  2-Patient indicated she is out of Lisinopril with no refills and can not call pharmacy   3-would like samples of Trilpix  Please call this # 1st [phone], if not available patient stated she has another # [phone] that she can also use

## 2011-08-25 NOTE — Telephone Encounter (Signed)
Rx sent for lisinopril and the samples that I left for the patient for the Trilipix in April are still at check in and she can pick those up.      KP

## 2011-08-26 NOTE — Telephone Encounter (Signed)
Called patient back advised about rx at pharmacy and samples-she says she will pick up today

## 2011-10-14 DIAGNOSIS — F411 Generalized anxiety disorder: Secondary | ICD-10-CM | POA: Insufficient documentation

## 2011-10-14 DIAGNOSIS — E785 Hyperlipidemia, unspecified: Secondary | ICD-10-CM | POA: Insufficient documentation

## 2011-10-14 DIAGNOSIS — F3289 Other specified depressive episodes: Secondary | ICD-10-CM | POA: Insufficient documentation

## 2011-10-14 DIAGNOSIS — Z79899 Other long term (current) drug therapy: Secondary | ICD-10-CM | POA: Insufficient documentation

## 2011-10-14 DIAGNOSIS — F329 Major depressive disorder, single episode, unspecified: Secondary | ICD-10-CM | POA: Insufficient documentation

## 2011-10-14 DIAGNOSIS — R079 Chest pain, unspecified: Secondary | ICD-10-CM | POA: Insufficient documentation

## 2011-10-14 DIAGNOSIS — I10 Essential (primary) hypertension: Secondary | ICD-10-CM | POA: Insufficient documentation

## 2011-10-15 ENCOUNTER — Emergency Department (HOSPITAL_COMMUNITY)
Admission: EM | Admit: 2011-10-15 | Discharge: 2011-10-15 | Disposition: A | Discharge status: Home / Self Care | Payer: Self-pay | Attending: Emergency Medicine | Admitting: Emergency Medicine

## 2011-10-15 ENCOUNTER — Encounter (HOSPITAL_COMMUNITY): Payer: Self-pay | Admitting: Emergency Medicine

## 2011-10-15 DIAGNOSIS — R079 Chest pain, unspecified: Secondary | ICD-10-CM

## 2011-10-15 DIAGNOSIS — I10 Essential (primary) hypertension: Secondary | ICD-10-CM

## 2011-10-15 DIAGNOSIS — F419 Anxiety disorder, unspecified: Secondary | ICD-10-CM

## 2011-10-15 LAB — TROPONIN I: Troponin I: <0.3 ng/mL (ref <0.30)

## 2011-10-15 MED ORDER — LORAZEPAM 1 MG PO TABS: 1.0000 mg | ORAL_TABLET | Freq: Three times a day (TID) | ORAL | Status: DC | PRN

## 2011-10-15 NOTE — ED Notes (Signed)
Patient reports that she is also having chest "tightness" and pressure with numbness and tingling to her bilateral arms

## 2011-10-15 NOTE — Discharge Instructions (Signed)
I am concerned about the episodes of chest pain you're having. Your EKG and blood tests today do not show any sign of heart damage, however, you need to discuss this with your PCP. He need to discuss whether it would be advisable to have a stress test or some other tests to evaluate for possible heart disease.  Chest Pain (Nonspecific) It is often hard to give a specific diagnosis for the cause of chest pain. There is always a chance that your pain could be related to something serious, such as a heart attack or a blood clot in the lungs. You need to follow up with your caregiver for further evaluation. CAUSES   Heartburn.   Pneumonia or bronchitis.   Anxiety or stress.   Inflammation around your heart (pericarditis) or lung (pleuritis or pleurisy).   A blood clot in the lung.   A collapsed lung (pneumothorax). It can develop suddenly on its own (spontaneous pneumothorax) or from injury (trauma) to the chest.   Shingles infection (herpes zoster virus).  The chest wall is composed of bones, muscles, and cartilage. Any of these can be the source of the pain.  The bones can be bruised by injury.   The muscles or cartilage can be strained by coughing or overwork.   The cartilage can be affected by inflammation and become sore (costochondritis).  DIAGNOSIS  Lab tests or other studies, such as X-rays, electrocardiography, stress testing, or cardiac imaging, may be needed to find the cause of your pain.  TREATMENT   Treatment depends on what may be causing your chest pain. Treatment may include:   Acid blockers for heartburn.   Anti-inflammatory medicine.   Pain medicine for inflammatory conditions.   Antibiotics if an infection is present.   You may be advised to change lifestyle habits. This includes stopping smoking and avoiding alcohol, caffeine, and chocolate.   You may be advised to keep your head raised (elevated) when sleeping. This reduces the chance of acid going backward  from your stomach into your esophagus.   Most of the time, nonspecific chest pain will improve within 2 to 3 days with rest and mild pain medicine.  HOME CARE INSTRUCTIONS   If antibiotics were prescribed, take your antibiotics as directed. Finish them even if you start to feel better.   For the next few days, avoid physical activities that bring on chest pain. Continue physical activities as directed.   Do not smoke.   Avoid drinking alcohol.   Only take over-the-counter or prescription medicine for pain, discomfort, or fever as directed by your caregiver.   Follow your caregiver's suggestions for further testing if your chest pain does not go away.   Keep any follow-up appointments you made. If you do not go to an appointment, you could develop lasting (chronic) problems with pain. If there is any problem keeping an appointment, you must call to reschedule.  SEEK MEDICAL CARE IF:   You think you are having problems from the medicine you are taking. Read your medicine instructions carefully.   Your chest pain does not go away, even after treatment.   You develop a rash with blisters on your chest.  SEEK IMMEDIATE MEDICAL CARE IF:   You have increased chest pain or pain that spreads to your arm, neck, jaw, back, or abdomen.   You develop shortness of breath, an increasing cough, or you are coughing up blood.   You have severe back or abdominal pain, feel nauseous, or vomit.  You develop severe weakness, fainting, or chills.   You have a fever.  THIS IS AN EMERGENCY. Do not wait to see if the pain will go away. Get medical help at once. Call your local emergency services (911 in [place] Do not drive yourself to the hospital. MAKE SURE YOU:   Understand these instructions.   Will watch your condition.   Will get help right away if you are not doing well or get worse.  Document Released: 12/08/2004 Document Revised: 02/17/2011 Document Reviewed: 10/04/2007 [facility] Patient  Information 2012 [place].  Anxiety and Panic Attacks Your caregiver has informed you that you are having an anxiety or panic attack. There may be many forms of this. Most of the time these attacks come suddenly and without warning. They come at any time of day, including periods of sleep, and at any time of life. They may be strong and unexplained. Although panic attacks are very scary, they are physically harmless. Sometimes the cause of your anxiety is not known. Anxiety is a protective mechanism of the body in its fight or flight mechanism. Most of these perceived danger situations are actually nonphysical situations (such as anxiety over losing a job). CAUSES  The causes of an anxiety or panic attack are many. Panic attacks may occur in otherwise healthy people given a certain set of circumstances. There may be a genetic cause for panic attacks. Some medications may also have anxiety as a side effect. SYMPTOMS  Some of the most common feelings are:  Intense terror.   Dizziness, feeling faint.   Hot and cold flashes.   Fear of going crazy.   Feelings that nothing is real.   Sweating.   Shaking.   Chest pain or a fast heartbeat (palpitations).   Smothering, choking sensations.   Feelings of impending doom and that death is near.   Tingling of extremities, this may be from over-breathing.   Altered reality (derealization).   Being detached from yourself (depersonalization).  Several symptoms can be present to make up anxiety or panic attacks. DIAGNOSIS  The evaluation by your caregiver will depend on the type of symptoms you are experiencing. The diagnosis of anxiety or panic attack is made when no physical illness can be determined to be a cause of the symptoms. TREATMENT  Treatment to prevent anxiety and panic attacks may include:  Avoidance of circumstances that cause anxiety.   Reassurance and relaxation.   Regular exercise.   Relaxation therapies, such as  yoga.   Psychotherapy with a psychiatrist or therapist.   Avoidance of caffeine, alcohol and illegal drugs.   Prescribed medication.  SEEK IMMEDIATE MEDICAL CARE IF:   You experience panic attack symptoms that are different than your usual symptoms.   You have any worsening or concerning symptoms.  Document Released: 02/28/2005 Document Revised: 02/17/2011 Document Reviewed: 07/02/2009 [facility] Patient Information 2012 [place].  Lorazepam tablets What is this medicine? LORAZEPAM (lor A ze pam) is a benzodiazepine. It is used to treat anxiety. This medicine may be used for other purposes; ask your health care provider or pharmacist if you have questions. What should I tell my health care provider before I take this medicine? They need to know if you have any of these conditions: -alcohol or drug abuse problem -bipolar disorder, depression, psychosis or other mental health condition -glaucoma -kidney or liver disease -lung disease or breathing difficulties -myasthenia gravis -Parkinson's disease -seizures or a history of seizures -suicidal thoughts -an unusual or allergic reaction to  lorazepam, other benzodiazepines, foods, dyes, or preservatives -pregnant or trying to get pregnant -breast-feeding How should I use this medicine? Take this medicine by mouth with a glass of water. Follow the directions on the prescription label. If it upsets your stomach, take it with food or milk. Take your medicine at regular intervals. Do not take it more often than directed. Do not stop taking except on the advice of your doctor or health care professional. Talk to your pediatrician regarding the use of this medicine in children. Special care may be needed. Overdosage: If you think you have taken too much of this medicine contact a poison control center or emergency room at once. NOTE: This medicine is only for you. Do not share this medicine with others. What if I miss a dose? If you  miss a dose, take it as soon as you can. If it is almost time for your next dose, take only that dose. Do not take double or extra doses. What may interact with this medicine? -barbiturate medicines for inducing sleep or treating seizures, like phenobarbital -clozapine -medicines for depression, mental problems or psychiatric disturbances -medicines for sleep -phenytoin -probenecid -theophylline -valproic acid This list may not describe all possible interactions. Give your health care provider a list of all the medicines, herbs, non-prescription drugs, or dietary supplements you use. Also tell them if you smoke, drink alcohol, or use illegal drugs. Some items may interact with your medicine. What should I watch for while using this medicine? Visit your doctor or health care professional for regular checks on your progress. Your body may become dependent on this medicine, ask your doctor or health care professional if you still need to take it. However, if you have been taking this medicine regularly for some time, do not suddenly stop taking it. You must gradually reduce the dose or you may get severe side effects. Ask your doctor or health care professional for advice before increasing or decreasing the dose. Even after you stop taking this medicine it can still affect your body for several days. You may get drowsy or dizzy. Do not drive, use machinery, or do anything that needs mental alertness until you know how this medicine affects you. To reduce the risk of dizzy and fainting spells, do not stand or sit up quickly, especially if you are an older patient. Alcohol may increase dizziness and drowsiness. Avoid alcoholic drinks. Do not treat yourself for coughs, colds or allergies without asking your doctor or health care professional for advice. Some ingredients can increase possible side effects. What side effects may I notice from receiving this medicine? Side effects that you should report to  your doctor or health care professional as soon as possible: -changes in vision -confusion -depression -mood changes, excitability or aggressive behavior -movement difficulty, staggering or jerky movements -muscle cramps -restlessness -weakness or tiredness Side effects that usually do not require medical attention (report to your doctor or health care professional if they continue or are bothersome): -constipation or diarrhea -difficulty sleeping, nightmares -dizziness, drowsiness -headache -nausea, vomiting This list may not describe all possible side effects. Call your doctor for medical advice about side effects. You may report side effects to FDA at 1-800-FDA-1088. Where should I keep my medicine? Keep out of the reach of children. This medicine can be abused. Keep your medicine in a safe place to protect it from theft. Do not share this medicine with anyone. Selling or giving away this medicine is dangerous and against the law. Store  at room temperature between 20 and 25 degrees C (68 and 77 degrees F). Protect from light. Keep container tightly closed. Throw away any unused medicine after the expiration date. NOTE: This sheet is a summary. It may not cover all possible information. If you have questions about this medicine, talk to your doctor, pharmacist, or health care provider.  2012, [employer]. (08/31/2007 2:58:20 PM)

## 2011-10-15 NOTE — ED Notes (Signed)
Pt requesting zoloft med because "I ran out and need some assistance"  Pt also request "shower"  No complaints at this time.  Pt took BP today and was elevated and decided to come in

## 2011-10-15 NOTE — ED Provider Notes (Signed)
History     CSN: [id number]  Arrival date & time 10/14/11  2330   First MD Initiated Contact with Patient 10/15/11 0119      Chief Complaint  Patient presents with  . Blood Pressure Check  . Anxiety  . Chest Pain    (Consider location/radiation/quality/duration/timing/severity/associated sxs/prior treatment) Patient is a 56 y.o. female presenting with anxiety and chest pain. The history is provided by the patient.  Anxiety Associated symptoms include chest pain.  Chest Pain   Patient gives a very confused and disjointed history and is difficult to get her to focus on questions that are asked. However, as she states that she had a sense of her arms and back being tenths and she thought her blood pressure was up because of that. She states she normally goes to [facility] blood pressure checked when it happens but she came here instead. That sensation has since resolved and her blood pressure was not especially elevated at triage. As I try to find other things about her, she keeps focusing on the fact that she should have gone to [facility] rather than coming to the emergency department. However, as she does describe a heavy feeling in tight feeling across her chest which comes intermittently. This episode was at dinnertime tonight. They're sometimes some associated dyspnea, and nausea, and diaphoresis. Does not seem to be exertional, however it is difficult to get information about it because every time I ask her about it as she moves the discussion to either her blood pressure, her chronic back pain, her in her arms, or various other things.  Past Medical History  Diagnosis Date  . Hypertension   . Hyperlipidemia   . Depression     Past Surgical History  Procedure Date  . Lumbar fusion   . Breast surgery     lumpectomy left breast    Family History  Problem Relation Age of Onset  . Stroke Mother   . Hypertension Mother   . Hyperlipidemia Mother   . Cancer  Father 75    liver and colon  . Depression Father   . Cancer Maternal Aunt     breast  . Cancer Maternal Grandmother     breast  . Diabetes Brother     History  Substance Use Topics  . Smoking status: Never Smoker   . Smokeless tobacco: Not on file  . Alcohol Use: No    OB History    Grav Para Term Preterm Abortions TAB SAB Ect Mult Living                  Review of Systems  Cardiovascular: Positive for chest pain.  All other systems reviewed and are negative.    Allergies  Review of patient's allergies indicates no known allergies.  Home Medications   Current Outpatient Rx  Name Route Sig Dispense Refill  . ATENOLOL 100 MG PO TABS Oral Take 1 tablet (100 mg total) by mouth daily. 30 tablet 5  . ATORVASTATIN CALCIUM 10 MG PO TABS Oral Take 1 tablet (10 mg total) by mouth daily. 30 tablet 2  . CHOLINE FENOFIBRATE 135 MG PO CPDR Oral Take 1 capsule (135 mg total) by mouth daily. 30 capsule 5  . LISINOPRIL-HYDROCHLOROTHIAZIDE 10-12.5 MG PO TABS Oral Take 1 tablet by mouth daily. 30 tablet 5  . POTASSIUM CHLORIDE CRYS ER 20 MEQ PO TBCR Oral Take 1 tablet (20 mEq total) by mouth daily. 30 tablet 0  . SERTRALINE HCL  100 MG PO TABS Oral Take 1 tablet (100 mg total) by mouth daily. 30 tablet 11    BP 143/88  Pulse 68  Temp 98.5 F (36.9 C) (Oral)  Resp 20  SpO2 100%  Physical Exam  Nursing note and vitals reviewed. Anxious appearing 56 year old female who is in no acute distress. Vital signs are significant for borderline hypertension with blood pressure 143/88. Oxygen saturation is 100% which is normal. Head is normocephalic and atraumatic. PERRLA, EOMI. Neck is nontender and supple. Back is nontender. Lungs are clear without rales, wheezes, rhonchi. Heart has regular rate and rhythm without murmur. Abdomen is soft, flat, nontender without masses or hepatosplenomegaly. Extremities have full range of motion, no cyanosis or edema. Skin is warm and dry without rash.  Neurologic: Mental status is normal except for anxiety, cranial nerves are intact, there are no motor or sensory deficits.  ED Course  Procedures (including critical care time)  Results for orders placed during the hospital encounter of 10/15/11  TROPONIN I      Component Value Range   Troponin I <0.30  <0.30 ng/mL    ECG shows normal sinus rhythm with a rate of 64, no ectopy. Normal axis. Normal P wave. Normal QRS. Normal intervals. Normal ST and T waves. Impression: normal ECG. Compared with ECG of 08/16/2007, no significant changes are seen.   1. Anxiety   2. Hypertension   3. Chest pain       MDM  Patient appears very anxious and she does have a history of hypertension but her blood pressure is not unduly elevated at this time. About her history of intermittent chest tightness. Last episode was more than 6 hours ago, so troponin will be checked and if negative she will be allowed to go home. However, I am recommending that she discuss this with her PCP and consider stress testing or cardiac CT scanning. She does have significant risk factors of hypertension and hyperlipidemia. Head is recommended to her that she obtain a blood pressure monitor so she can check her blood pressure at home rather than having to walk to [facility] to have her blood pressure checked.        Delora Fuel, MD [id number]

## 2011-10-15 NOTE — ED Notes (Signed)
Patient states that she was at [facility] and felt that she needed to get her blood pressure checked,. The patient reports that she is having a lot of stress. The patient reports she has generalized pain

## 2011-10-26 ENCOUNTER — Encounter (HOSPITAL_COMMUNITY): Payer: Self-pay | Admitting: Emergency Medicine

## 2011-10-26 ENCOUNTER — Emergency Department (HOSPITAL_COMMUNITY)
Admission: EM | Admit: 2011-10-26 | Discharge: 2011-10-27 | Disposition: A | Discharge status: Home / Self Care | Payer: Self-pay | Attending: Emergency Medicine | Admitting: Emergency Medicine

## 2011-10-26 DIAGNOSIS — F329 Major depressive disorder, single episode, unspecified: Secondary | ICD-10-CM | POA: Insufficient documentation

## 2011-10-26 DIAGNOSIS — Z818 Family history of other mental and behavioral disorders: Secondary | ICD-10-CM | POA: Insufficient documentation

## 2011-10-26 DIAGNOSIS — I10 Essential (primary) hypertension: Secondary | ICD-10-CM | POA: Insufficient documentation

## 2011-10-26 DIAGNOSIS — Z803 Family history of malignant neoplasm of breast: Secondary | ICD-10-CM | POA: Insufficient documentation

## 2011-10-26 DIAGNOSIS — Z8249 Family history of ischemic heart disease and other diseases of the circulatory system: Secondary | ICD-10-CM | POA: Insufficient documentation

## 2011-10-26 DIAGNOSIS — J40 Bronchitis, not specified as acute or chronic: Secondary | ICD-10-CM | POA: Insufficient documentation

## 2011-10-26 DIAGNOSIS — Z833 Family history of diabetes mellitus: Secondary | ICD-10-CM | POA: Insufficient documentation

## 2011-10-26 DIAGNOSIS — Z800 Family history of malignant neoplasm of digestive organs: Secondary | ICD-10-CM | POA: Insufficient documentation

## 2011-10-26 DIAGNOSIS — Z823 Family history of stroke: Secondary | ICD-10-CM | POA: Insufficient documentation

## 2011-10-26 DIAGNOSIS — E785 Hyperlipidemia, unspecified: Secondary | ICD-10-CM | POA: Insufficient documentation

## 2011-10-26 DIAGNOSIS — F3289 Other specified depressive episodes: Secondary | ICD-10-CM | POA: Insufficient documentation

## 2011-10-26 NOTE — ED Notes (Addendum)
Pt c/o productive cough (yellow) and sore throat x 3-4 days, pt is homeless. Pt was living at [address] in [place], others in house had persistent cough

## 2011-10-27 ENCOUNTER — Emergency Department (HOSPITAL_COMMUNITY): Payer: Self-pay

## 2011-10-27 MED ORDER — HYDROCODONE-ACETAMINOPHEN 7.5-500 MG/15ML PO SOLN: 10.0000 mL | Freq: Four times a day (QID) | ORAL | Status: AC | PRN

## 2011-10-27 MED ORDER — HYDROCODONE-ACETAMINOPHEN 7.5-500 MG/15ML PO SOLN
10.0000 mL | Freq: Once | ORAL | Status: AC
  Administered 2011-10-27: 10 mL via ORAL
  Filled 2011-10-27: qty 15

## 2011-10-27 MED ORDER — AZITHROMYCIN 500 MG PO TABS: 500.0000 mg | ORAL_TABLET | Freq: Every day | ORAL | Status: AC

## 2011-10-27 MED ORDER — ALBUTEROL SULFATE HFA 108 (90 BASE) MCG/ACT IN AERS
2.0000 | INHALATION_SPRAY | Freq: Once | RESPIRATORY_TRACT | Status: AC
  Administered 2011-10-27: 2 via RESPIRATORY_TRACT
  Filled 2011-10-27: qty 6.7

## 2011-10-27 MED ORDER — ALBUTEROL SULFATE (5 MG/ML) 0.5% IN NEBU
5.0000 mg | INHALATION_SOLUTION | Freq: Once | RESPIRATORY_TRACT | Status: AC
  Administered 2011-10-27: 5 mg via RESPIRATORY_TRACT
  Filled 2011-10-27: qty 1

## 2011-10-27 NOTE — ED Provider Notes (Signed)
Medical screening examination/treatment/procedure(s) were performed by non-physician practitioner and as supervising physician I was immediately available for consultation/collaboration.  Leota Jacobsen, MD 10/27/11 [phone]

## 2011-10-27 NOTE — ED Provider Notes (Signed)
History     CSN: [id number]  Arrival date & time 10/26/11  2339   None     Chief Complaint  Patient presents with  . Cough    (Consider location/radiation/quality/duration/timing/severity/associated sxs/prior treatment) HPI  56 y.o. undomiciled female complaining of productive cough worsening over the course of 3-4 days. Patient denies fever, chest pain, shortness of breath.    Past Medical History  Diagnosis Date  . Hypertension   . Hyperlipidemia   . Depression     Past Surgical History  Procedure Date  . Lumbar fusion   . Breast surgery     lumpectomy left breast    Family History  Problem Relation Age of Onset  . Stroke Mother   . Hypertension Mother   . Hyperlipidemia Mother   . Cancer Father 57    liver and colon  . Depression Father   . Cancer Maternal Aunt     breast  . Cancer Maternal Grandmother     breast  . Diabetes Brother     History  Substance Use Topics  . Smoking status: Never Smoker   . Smokeless tobacco: Not on file  . Alcohol Use: No    OB History    Grav Para Term Preterm Abortions TAB SAB Ect Mult Living                  Review of Systems  Respiratory: Positive for cough. Negative for shortness of breath.   All other systems reviewed and are negative.    Allergies  Review of patient's allergies indicates no known allergies.  Home Medications   Current Outpatient Rx  Name Route Sig Dispense Refill  . ATENOLOL 100 MG PO TABS Oral Take 1 tablet (100 mg total) by mouth daily. 30 tablet 5  . ATORVASTATIN CALCIUM 10 MG PO TABS Oral Take 1 tablet (10 mg total) by mouth daily. 30 tablet 2  . CHOLINE FENOFIBRATE 135 MG PO CPDR Oral Take 1 capsule (135 mg total) by mouth daily. 30 capsule 5  . LISINOPRIL-HYDROCHLOROTHIAZIDE 10-12.5 MG PO TABS Oral Take 1 tablet by mouth daily. 30 tablet 5  . SERTRALINE HCL 100 MG PO TABS Oral Take 100 mg by mouth daily.    . AZITHROMYCIN 500 MG PO TABS Oral Take 1 tablet (500 mg total) by  mouth daily. 3 tablet 0  . HYDROCODONE-ACETAMINOPHEN 7.5-500 MG/15ML PO SOLN Oral Take 10 mLs by mouth every 6 (six) hours as needed for pain. 120 mL 0    BP 104/64  Pulse 67  Temp 98.2 F (36.8 C) (Oral)  Resp 16  SpO2 96%  Physical Exam  Nursing note and vitals reviewed. Constitutional: She is oriented to person, place, and time. She appears well-developed and well-nourished. No distress.  HENT:  Head: Normocephalic.  Eyes: Conjunctivae and EOM are normal. Pupils are equal, round, and reactive to light.  Cardiovascular: Normal rate, regular rhythm, normal heart sounds and intact distal pulses.   Pulmonary/Chest: Effort normal and breath sounds normal. No respiratory distress. She has no wheezes. She has no rales. She exhibits no tenderness.       Bronchial breath sounds  Abdominal: Soft.  Musculoskeletal: Normal range of motion.  Neurological: She is alert and oriented to person, place, and time.  Psychiatric: She has a normal mood and affect.    ED Course  Procedures (including critical care time)  Labs Reviewed - No data to display Dg Chest 2 View  10/27/2011  *RADIOLOGY REPORT*  Clinical  Data: Cough, asthma.  CHEST - 2 VIEW  Comparison: 08/16/2007  Findings: Coarse interstitial opacities in both lower lobes.  No focal infiltrate or overt edema.  No effusion.  Heart size normal. Regional bones unremarkable.  IMPRESSION:  No acute disease  Original Report Authenticated By: Dillard Cannon III, M.D.     1. Bronchitis       MDM  Patient with productive cough sore throat for 4 days. Lung sounds are clear with bronchial rhonchi that clear with cough. Chest x-ray shows no infiltrate. I will give patient azithromycin 500 mg 2 daily X3 cough suppressant with hydrocodone acetaminophen syrup and an inhaler at the patient's request. Pt verbalized understanding and agrees with care plan. Outpatient follow-up and return precautions given.           Monico Blitz,  PA-C 10/27/11 [phone]

## 2011-10-27 NOTE — ED Notes (Signed)
Patient transported to X-ray 

## 2011-10-27 NOTE — ED Notes (Signed)
PA Nicole at bedside. 

## 2011-10-27 NOTE — ED Notes (Signed)
Pt insists she needs a neb treatment. PA aware. Explained to pt she is being d/c'ed with Albuterol Hand held inhaler.

## 2011-10-27 NOTE — ED Notes (Signed)
MD at bedside. 

## 2011-12-12 ENCOUNTER — Other Ambulatory Visit: Payer: Self-pay | Admitting: Family Medicine

## 2011-12-12 ENCOUNTER — Ambulatory Visit: Payer: Self-pay | Admitting: Family Medicine

## 2011-12-12 DIAGNOSIS — Z0289 Encounter for other administrative examinations: Secondary | ICD-10-CM

## 2011-12-12 MED ORDER — ATENOLOL 100 MG PO TABS: 100.0000 mg | ORAL_TABLET | Freq: Every day | ORAL | Status: DC

## 2011-12-12 NOTE — Telephone Encounter (Signed)
Refill Atenolol (Tab) 100 MG Take 1 tablet (100 mg total) by mouth daily #60 last fill 9.16.13 Last ov 3.28.13 V70 NOTE - pt does have a follow up appt today at 915am

## 2012-02-17 ENCOUNTER — Other Ambulatory Visit: Payer: Self-pay | Admitting: Family Medicine

## 2012-02-17 DIAGNOSIS — I10 Essential (primary) hypertension: Secondary | ICD-10-CM

## 2012-02-17 NOTE — Telephone Encounter (Signed)
msg left to call the office     KP 

## 2012-02-17 NOTE — Telephone Encounter (Signed)
pt called stated she needs her medicine today as she is out needs before she goes to work this evening NOTE This patient has no showed on the last 3-appts she has had Advises patient all future requests need to be called into her pharmacy whether she has refills or not as they will send the request in with the appropriate information needed for drs. Approval  Advised pt dr.lowne was out of the office today and another provider would have to approve the medicines for refills and normal wait time is 24-48hrs from the time we receive it.  Pt stated she has been out for 2-days and is feeling dizziness  Pt stated she uses [facility] [address]  meds below are as last wrt   1-Atenolol (Tab) 100 MG Take 1 tablet (100 mg total) by mouth daily #30 9.30.13  2-Lisinopril-Hydrochlorothiazide (Tab) ,10-12.5 MG Take 1 tablet by mouth daily #30 wt/5-refills 6.13.13  LAST OV wt/labs 3.28.13 V70 NOTE lab notes instructed pt to return on 48-months for recheck

## 2012-02-20 MED ORDER — LISINOPRIL-HYDROCHLOROTHIAZIDE 10-12.5 MG PO TABS: 1.0000 | ORAL_TABLET | Freq: Every day | ORAL | Status: DC

## 2012-02-20 MED ORDER — ATENOLOL 100 MG PO TABS: 100.0000 mg | ORAL_TABLET | Freq: Every day | ORAL | Status: DC

## 2012-02-20 NOTE — Telephone Encounter (Signed)
letter mailed to schedule an OV.     KP

## 2012-03-16 ENCOUNTER — Other Ambulatory Visit: Payer: Self-pay | Admitting: *Deleted

## 2012-03-16 DIAGNOSIS — I10 Essential (primary) hypertension: Secondary | ICD-10-CM

## 2012-03-16 MED ORDER — ATENOLOL 100 MG PO TABS: 100.0000 mg | ORAL_TABLET | Freq: Every day | ORAL | Status: DC

## 2012-03-16 MED ORDER — LISINOPRIL-HYDROCHLOROTHIAZIDE 10-12.5 MG PO TABS: 1.0000 | ORAL_TABLET | Freq: Every day | ORAL | Status: DC

## 2012-03-16 NOTE — Telephone Encounter (Signed)
Rx sent 

## 2012-03-23 ENCOUNTER — Ambulatory Visit: Payer: Self-pay | Admitting: Family Medicine

## 2012-03-23 ENCOUNTER — Encounter: Payer: Self-pay | Admitting: *Deleted

## 2012-03-23 DIAGNOSIS — Z0289 Encounter for other administrative examinations: Secondary | ICD-10-CM

## 2012-04-02 ENCOUNTER — Other Ambulatory Visit: Payer: Self-pay | Admitting: Family Medicine

## 2012-04-03 ENCOUNTER — Telehealth: Payer: Self-pay | Admitting: Family Medicine

## 2012-04-03 MED ORDER — SERTRALINE HCL 100 MG PO TABS: 100.0000 mg | ORAL_TABLET | Freq: Every day | ORAL | Status: DC

## 2012-04-03 NOTE — Telephone Encounter (Signed)
Rx re-faxed and a msg has been left to have the patient call me back.   KP

## 2012-04-03 NOTE — Telephone Encounter (Signed)
PT stated she just called [facility] on [address] and they told her we have not sent in Refill for zoloft--advised pt sent yesterday, pt wants rx sent again if possible--cb# [phone] Advised pt that she needs to schedule OV per last refill and she stated "I'm in my car an can't"

## 2012-04-04 NOTE — Telephone Encounter (Signed)
Letter mailed to schedule an Office visit.      KP

## 2012-04-28 ENCOUNTER — Other Ambulatory Visit: Payer: Self-pay | Admitting: Family Medicine

## 2012-06-01 ENCOUNTER — Encounter: Payer: Self-pay | Admitting: *Deleted

## 2012-06-01 ENCOUNTER — Other Ambulatory Visit: Payer: Self-pay | Admitting: Family Medicine

## 2012-06-01 NOTE — Telephone Encounter (Signed)
Letter Mail

## 2012-07-06 ENCOUNTER — Telehealth: Payer: Self-pay | Admitting: Family Medicine

## 2012-07-06 MED ORDER — ATENOLOL 100 MG PO TABS: ORAL_TABLET | ORAL | Status: DC

## 2012-07-06 MED ORDER — SERTRALINE HCL 100 MG PO TABS: ORAL_TABLET | ORAL | Status: DC

## 2012-07-06 MED ORDER — LISINOPRIL-HYDROCHLOROTHIAZIDE 10-12.5 MG PO TABS: ORAL_TABLET | ORAL | Status: DC

## 2012-07-06 NOTE — Telephone Encounter (Signed)
Pt called to see could she get a refill on her atenolol, lisinopril and sertraline. For it to be called into [facility] on [address]  thanks

## 2012-07-23 ENCOUNTER — Telehealth: Payer: Self-pay | Admitting: Family Medicine

## 2012-07-23 MED ORDER — SERTRALINE HCL 100 MG PO TABS: ORAL_TABLET | ORAL | Status: DC

## 2012-07-23 MED ORDER — ATENOLOL 100 MG PO TABS: ORAL_TABLET | ORAL | Status: DC

## 2012-07-23 MED ORDER — LISINOPRIL-HYDROCHLOROTHIAZIDE 10-12.5 MG PO TABS: ORAL_TABLET | ORAL | Status: DC

## 2012-07-23 NOTE — Telephone Encounter (Signed)
Pt came in and wanted to get a refill on atenolol,lisinopril and zoloft and for it to be sent into [facility] on [address]. Also pt wanted to know can she get a month worth this time and that she need all rx by today if possible. Thanks

## 2012-07-26 ENCOUNTER — Ambulatory Visit: Payer: Self-pay | Admitting: Family Medicine

## 2012-07-31 ENCOUNTER — Telehealth: Payer: Self-pay | Admitting: Family Medicine

## 2012-07-31 ENCOUNTER — Ambulatory Visit (INDEPENDENT_AMBULATORY_CARE_PROVIDER_SITE_OTHER): Payer: Self-pay | Admitting: Family Medicine

## 2012-07-31 ENCOUNTER — Ambulatory Visit: Payer: Self-pay | Admitting: Family Medicine

## 2012-07-31 ENCOUNTER — Encounter: Payer: Self-pay | Admitting: Family Medicine

## 2012-07-31 VITALS — BP 134/90 | HR 69 | Temp 98.8°F | Wt 234.0 lb

## 2012-07-31 DIAGNOSIS — M21619 Bunion of unspecified foot: Secondary | ICD-10-CM

## 2012-07-31 DIAGNOSIS — B351 Tinea unguium: Secondary | ICD-10-CM | POA: Insufficient documentation

## 2012-07-31 DIAGNOSIS — M21611 Bunion of right foot: Secondary | ICD-10-CM

## 2012-07-31 DIAGNOSIS — E785 Hyperlipidemia, unspecified: Secondary | ICD-10-CM

## 2012-07-31 DIAGNOSIS — I10 Essential (primary) hypertension: Secondary | ICD-10-CM

## 2012-07-31 DIAGNOSIS — F329 Major depressive disorder, single episode, unspecified: Secondary | ICD-10-CM

## 2012-07-31 MED ORDER — SERTRALINE HCL 100 MG PO TABS: ORAL_TABLET | ORAL | Status: DC

## 2012-07-31 MED ORDER — ATENOLOL 100 MG PO TABS
ORAL_TABLET | ORAL | Status: DC
Start: 2012-07-31 — End: 2012-12-14

## 2012-07-31 MED ORDER — LISINOPRIL-HYDROCHLOROTHIAZIDE 20-25 MG PO TABS: 1.0000 | ORAL_TABLET | Freq: Every day | ORAL | Status: DC

## 2012-07-31 NOTE — Patient Instructions (Addendum)

## 2012-07-31 NOTE — Assessment & Plan Note (Signed)
Check labs 

## 2012-07-31 NOTE — Assessment & Plan Note (Signed)
Refill zoloft  Pt has been off of it for a week

## 2012-07-31 NOTE — Assessment & Plan Note (Signed)
Refer to podiatry

## 2012-07-31 NOTE — Progress Notes (Signed)
  Subjective:    Patient here for follow-up of elevated blood pressure.  She is not exercising and is adherent to a low-salt diet.  Blood pressure is not well controlled at home. Cardiac symptoms: none. Patient denies: chest pain, chest pressure/discomfort, claudication, dyspnea, exertional chest pressure/discomfort, fatigue, irregular heart beat, lower extremity edema, near-syncope, orthopnea, palpitations, paroxysmal nocturnal dyspnea, syncope and tachypnea. Cardiovascular risk factors: dyslipidemia, hypertension, obesity (BMI >= 30 kg/m2) and sedentary lifestyle. Use of agents associated with hypertension: none. History of target organ damage: none.  Pt also here to have labs done for hyperlipidemia. She also c/o nail fungus on toenails.  It has been a problem for over a year.   The following portions of the patient's history were reviewed and updated as appropriate: allergies, current medications, past family history, past medical history, past social history, past surgical history and problem list.  Review of Systems Pertinent items are noted in HPI.     Objective:    BP 134/90  Pulse 69  Temp(Src) 98.8 F (37.1 C) (Oral)  Wt 234 lb (106.142 kg)  BMI 34.07 kg/m2  SpO2 96% General appearance: alert, cooperative, appears stated age and no distress Neck: no adenopathy, no carotid bruit, supple, symmetrical, trachea midline and thyroid not enlarged, symmetric, no tenderness/mass/nodules Lungs: clear to auscultation bilaterally Heart: S1, S2 normal Extremities: extremities normal, atraumatic, no cyanosis or edema   toenails-- thick, and lifted up Assessment:    Hypertension, stage 1 . Evidence of target organ damage: none.    Plan:    Medication: increase to lisinopril 20/25. Dietary sodium restriction. Regular aerobic exercise. Check blood pressures 2-3 times weekly and record. Follow up: 3 months and as needed.

## 2012-08-01 ENCOUNTER — Other Ambulatory Visit: Payer: Self-pay

## 2012-08-08 ENCOUNTER — Encounter: Payer: Self-pay | Admitting: Family Medicine

## 2012-08-10 ENCOUNTER — Telehealth: Payer: Self-pay | Admitting: General Practice

## 2012-08-10 DIAGNOSIS — I10 Essential (primary) hypertension: Secondary | ICD-10-CM

## 2012-08-10 NOTE — Telephone Encounter (Signed)
Received a fax from [facility] in regards to Pt sertraline and lisinopril. Pharmacy states that pt would like a 90 day supply of both medications. Pt last seen on 07-31-2012. Please advise.

## 2012-08-12 NOTE — Telephone Encounter (Signed)
[facility] for 90 day with 1 refill

## 2012-08-13 MED ORDER — LISINOPRIL-HYDROCHLOROTHIAZIDE 20-25 MG PO TABS: 1.0000 | ORAL_TABLET | Freq: Every day | ORAL | Status: DC

## 2012-08-13 MED ORDER — SERTRALINE HCL 100 MG PO TABS: ORAL_TABLET | ORAL | Status: DC

## 2012-08-14 ENCOUNTER — Other Ambulatory Visit (INDEPENDENT_AMBULATORY_CARE_PROVIDER_SITE_OTHER): Payer: Self-pay

## 2012-08-14 DIAGNOSIS — E785 Hyperlipidemia, unspecified: Secondary | ICD-10-CM

## 2012-08-14 LAB — CBC WITH DIFFERENTIAL/PLATELET
Basophils Absolute: 0 10*3/uL (ref 0.0–0.1)
Basophils Relative: 0.5 % (ref 0.0–3.0)
Eosinophils Absolute: 0.1 10*3/uL (ref 0.0–0.7)
Eosinophils Relative: 3.2 % (ref 0.0–5.0)
HCT: 35.3 % — ABNORMAL LOW (ref 36.0–46.0)
Hemoglobin: 12 g/dL (ref 12.0–15.0)
Lymphocytes Relative: 17 % (ref 12.0–46.0)
Lymphs Abs: 0.7 10*3/uL (ref 0.7–4.0)
MCHC: 34.1 g/dL (ref 30.0–36.0)
MCV: 85.6 fl (ref 78.0–100.0)
Monocytes Absolute: 0.3 10*3/uL (ref 0.1–1.0)
Monocytes Relative: 6.4 % (ref 3.0–12.0)
Neutro Abs: 3.1 10*3/uL (ref 1.4–7.7)
Neutrophils Relative %: 72.9 % (ref 43.0–77.0)
Platelets: 176 10*3/uL (ref 150.0–400.0)
RBC: 4.12 Mil/uL (ref 3.87–5.11)
RDW: 14.3 % (ref 11.5–14.6)
WBC: 4.3 10*3/uL — ABNORMAL LOW (ref 4.5–10.5)

## 2012-08-14 LAB — LIPID PANEL
Cholesterol: 414 mg/dL — ABNORMAL HIGH (ref 0–200)
HDL: 57.6 mg/dL (ref >39.00)
Total CHOL/HDL Ratio: 7
Triglycerides: 247 mg/dL — ABNORMAL HIGH (ref 0.0–149.0)
VLDL: 49.4 mg/dL — ABNORMAL HIGH (ref 0.0–40.0)

## 2012-08-14 LAB — HEPATIC FUNCTION PANEL
ALT: 25 U/L (ref 0–35)
AST: 19 U/L (ref 0–37)
Albumin: 3.6 g/dL (ref 3.5–5.2)
Alkaline Phosphatase: 74 U/L (ref 39–117)
Bilirubin, Direct: 0 mg/dL (ref 0.0–0.3)
Total Bilirubin: 0.3 mg/dL (ref 0.3–1.2)
Total Protein: 6.9 g/dL (ref 6.0–8.3)

## 2012-08-14 LAB — BASIC METABOLIC PANEL
BUN: 15 mg/dL (ref 6–23)
CO2: 26 mEq/L (ref 19–32)
Calcium: 11.4 mg/dL — ABNORMAL HIGH (ref 8.4–10.5)
Chloride: 108 mEq/L (ref 96–112)
Creatinine, Ser: 0.8 mg/dL (ref 0.4–1.2)
GFR: 83.34 mL/min (ref >60.00)
Glucose, Bld: 115 mg/dL — ABNORMAL HIGH (ref 70–99)
Potassium: 3.7 mEq/L (ref 3.5–5.1)
Sodium: 139 mEq/L (ref 135–145)

## 2012-08-14 LAB — LDL CHOLESTEROL, DIRECT: Direct LDL: 148.4 mg/dL

## 2012-08-20 ENCOUNTER — Telehealth: Payer: Self-pay | Admitting: Family Medicine

## 2012-08-20 NOTE — Telephone Encounter (Signed)
In reference to Podiatry referral entered 07/31/12, patient has never scheduled. Patient was contacted multiple times by [facility], and myself.  Also I mailed patient a letter. She will not respond.

## 2012-08-20 NOTE — Telephone Encounter (Signed)
noted 

## 2012-08-24 MED ORDER — ATORVASTATIN CALCIUM 20 MG PO TABS: 20.0000 mg | ORAL_TABLET | Freq: Every day | ORAL | Status: DC

## 2012-09-18 ENCOUNTER — Telehealth: Payer: Self-pay | Admitting: *Deleted

## 2012-09-18 NOTE — Telephone Encounter (Signed)
Pt left VM that she has yet to hear about her lab results. Called Pt back and discuss results with Pt. Copy of labs placed up front for Pt to pick up.  Notes Recorded by Rosalita Chessman, DO on 08/16/2012 at 11:04 PM Cholesterol--- LDL goal < 100, HDL >40, TG < 150. Diet and exercise will increase HDL and decrease LDL and TG. Fish, Fish Oil, Flaxseed oil will also help increase the HDL and decrease Triglycerides. Recheck labs in 3 months-----lipid hep. Increase lipitor 20 mg #30 1 po qhs

## 2012-12-05 ENCOUNTER — Encounter (HOSPITAL_COMMUNITY): Payer: Self-pay | Admitting: Emergency Medicine

## 2012-12-05 ENCOUNTER — Emergency Department (HOSPITAL_COMMUNITY)
Admission: EM | Admit: 2012-12-05 | Discharge: 2012-12-06 | Disposition: A | Discharge status: Home / Self Care | Payer: Worker's Compensation | Attending: Emergency Medicine | Admitting: Emergency Medicine

## 2012-12-05 DIAGNOSIS — I10 Essential (primary) hypertension: Secondary | ICD-10-CM | POA: Insufficient documentation

## 2012-12-05 DIAGNOSIS — Y9289 Other specified places as the place of occurrence of the external cause: Secondary | ICD-10-CM | POA: Insufficient documentation

## 2012-12-05 DIAGNOSIS — F3289 Other specified depressive episodes: Secondary | ICD-10-CM | POA: Insufficient documentation

## 2012-12-05 DIAGNOSIS — M25562 Pain in left knee: Secondary | ICD-10-CM

## 2012-12-05 DIAGNOSIS — S8990XA Unspecified injury of unspecified lower leg, initial encounter: Secondary | ICD-10-CM | POA: Insufficient documentation

## 2012-12-05 DIAGNOSIS — F329 Major depressive disorder, single episode, unspecified: Secondary | ICD-10-CM | POA: Insufficient documentation

## 2012-12-05 DIAGNOSIS — Y9389 Activity, other specified: Secondary | ICD-10-CM | POA: Insufficient documentation

## 2012-12-05 DIAGNOSIS — Z79899 Other long term (current) drug therapy: Secondary | ICD-10-CM | POA: Insufficient documentation

## 2012-12-05 DIAGNOSIS — Y990 Civilian activity done for income or pay: Secondary | ICD-10-CM | POA: Insufficient documentation

## 2012-12-05 DIAGNOSIS — E785 Hyperlipidemia, unspecified: Secondary | ICD-10-CM | POA: Insufficient documentation

## 2012-12-05 DIAGNOSIS — W010XXA Fall on same level from slipping, tripping and stumbling without subsequent striking against object, initial encounter: Secondary | ICD-10-CM | POA: Insufficient documentation

## 2012-12-05 NOTE — ED Notes (Signed)
Pt states while working tonight @ [employer], pt states she slipped on floor falling onto L knee, abrasion noted. Pt states R knee twisted.

## 2012-12-06 ENCOUNTER — Emergency Department (HOSPITAL_COMMUNITY): Payer: Worker's Compensation

## 2012-12-06 MED ORDER — ACETAMINOPHEN 325 MG PO TABS
650.0000 mg | ORAL_TABLET | Freq: Once | ORAL | Status: DC
  Administered 2012-12-06: 650 mg via ORAL
  Filled 2012-12-06: qty 2

## 2012-12-06 MED ORDER — IBUPROFEN 800 MG PO TABS: 800.0000 mg | ORAL_TABLET | Freq: Three times a day (TID) | ORAL | Status: DC

## 2012-12-06 MED ORDER — IBUPROFEN 800 MG PO TABS
800.0000 mg | ORAL_TABLET | Freq: Once | ORAL | Status: AC
  Administered 2012-12-06: 800 mg via ORAL
  Filled 2012-12-06: qty 1

## 2012-12-06 MED ORDER — ACETAMINOPHEN 500 MG PO TABS: 500.0000 mg | ORAL_TABLET | Freq: Four times a day (QID) | ORAL | Status: DC | PRN

## 2012-12-06 MED ORDER — ACETAMINOPHEN 325 MG PO TABS
650.0000 mg | ORAL_TABLET | Freq: Once | ORAL | Status: AC
  Administered 2012-12-06: 650 mg via ORAL
  Filled 2012-12-06: qty 2

## 2012-12-06 MED ORDER — ATENOLOL 100 MG PO TABS
100.0000 mg | ORAL_TABLET | Freq: Once | ORAL | Status: DC
  Filled 2012-12-06: qty 1

## 2012-12-06 MED ORDER — METOPROLOL TARTRATE 25 MG PO TABS
50.0000 mg | ORAL_TABLET | Freq: Once | ORAL | Status: DC
  Filled 2012-12-06: qty 2

## 2012-12-06 MED ORDER — METOPROLOL TARTRATE 25 MG PO TABS
50.0000 mg | ORAL_TABLET | Freq: Once | ORAL | Status: AC
  Administered 2012-12-06: 50 mg via ORAL
  Filled 2012-12-06: qty 2

## 2012-12-06 NOTE — ED Provider Notes (Signed)
CSN: [id number]     Arrival date & time 12/05/12  2310 History   First MD Initiated Contact with Patient 12/05/12 2342     Chief Complaint  Patient presents with  . Knee Injury   (Consider location/radiation/quality/duration/timing/severity/associated sxs/prior Treatment) HPI Comments: Patient is a 57 year old female presented to the emergency department after slipping and falling at work. Patient states she fell onto her left knee and felt immediate pain with immediate swelling. She states this occurred around 9:30 PM this evening. Patient states she is having a sharp pain in the anterior portion of her knee without radiation. She describes her pain 9/10 worse with ambulating. She has not tried anything at home to help with pain. Patient also states her right knee is sore as it caught the ground during the fall as well. Patient denies hitting her head or losing consciousness. Patient endorses she has chronic numbness down left leg from previous back injury and denies any changes from baseline.    Past Medical History  Diagnosis Date  . Hypertension   . Hyperlipidemia   . Depression    Past Surgical History  Procedure Laterality Date  . Lumbar fusion    . Breast surgery      lumpectomy left breast  . Back surgery     Family History  Problem Relation Age of Onset  . Stroke Mother   . Hypertension Mother   . Hyperlipidemia Mother   . Cancer Father 21    liver and colon  . Depression Father   . Cancer Maternal Aunt     breast  . Cancer Maternal Grandmother     breast  . Diabetes Brother    History  Substance Use Topics  . Smoking status: Never Smoker   . Smokeless tobacco: Not on file  . Alcohol Use: No   OB History   Grav Para Term Preterm Abortions TAB SAB Ect Mult Living                 Review of Systems  Constitutional: Negative for fever and chills.  Musculoskeletal: Positive for myalgias, joint swelling and arthralgias.  Skin:       Abrasion  Neurological:  Negative for syncope and headaches.    Allergies  Review of patient's allergies indicates no known allergies.  Home Medications   Current Outpatient Rx  Name  Route  Sig  Dispense  Refill  . atenolol (TENORMIN) 100 MG tablet      TAKE 1 TABLET BY MOUTH DAILY *OFFICE VISIT DUE NOW*   90 tablet   3   . lisinopril-hydrochlorothiazide (PRINZIDE,ZESTORETIC) 20-25 MG per tablet   Oral   Take 1 tablet by mouth daily.   90 tablet   3   . sertraline (ZOLOFT) 100 MG tablet      TAKE 1 TABLET BY MOUTH DAILY   90 tablet   1   . acetaminophen (TYLENOL) 500 MG tablet   Oral   Take 1 tablet (500 mg total) by mouth every 6 (six) hours as needed for pain.   30 tablet   0   . atorvastatin (LIPITOR) 20 MG tablet   Oral   Take 1 tablet (20 mg total) by mouth daily.   30 tablet   2   . ibuprofen (ADVIL,MOTRIN) 800 MG tablet   Oral   Take 1 tablet (800 mg total) by mouth 3 (three) times daily.   21 tablet   0    BP 165/95  Pulse 78  Temp(Src) 99.2 F (37.3 C) (Oral)  Resp 18  Ht 5\' 10"  (1.778 m)  Wt 200 lb (90.719 kg)  BMI 28.7 kg/m2  SpO2 100% Physical Exam  Constitutional: She is oriented to person, place, and time. She appears well-developed and well-nourished. No distress.  HENT:  Head: Normocephalic and atraumatic.  Right Ear: External ear normal.  Left Ear: External ear normal.  Nose: Nose normal.  Eyes: Conjunctivae are normal.  Neck: Neck supple.  Cardiovascular: Normal rate and intact distal pulses.   Pulmonary/Chest: Effort normal.  Musculoskeletal: Normal range of motion.       Right hip: Normal.       Left hip: Normal.       Right knee: Normal.       Left knee: She exhibits swelling. She exhibits normal range of motion, no effusion, no ecchymosis, no erythema, normal alignment, no LCL laxity, normal patellar mobility, normal meniscus and no MCL laxity. Tenderness found.       Right ankle: Normal.       Left ankle: Normal. Achilles tendon normal.        Lumbar back: She exhibits tenderness and bony tenderness. She exhibits no swelling and no deformity.       Right lower leg: Normal.       Left lower leg: Normal.       Right foot: Normal.       Left foot: Normal.  Normal chronic back pain per patient.   Neurological: She is alert and oriented to person, place, and time. She has normal strength. No sensory deficit.  Able to ambulate  Skin: Skin is warm and dry. She is not diaphoretic.  Psychiatric: She has a normal mood and affect.    ED Course  Procedures (including critical care time) Medications  ibuprofen (ADVIL,MOTRIN) tablet 800 mg (800 mg Oral Given 12/06/12 0014)    Labs Review Labs Reviewed - No data to display Imaging Review Dg Knee Complete 4 Views Left  12/06/2012   CLINICAL DATA:  Status post fall. Pain about the patella.  EXAM: LEFT KNEE - COMPLETE 4+ VIEW  COMPARISON:  None.  FINDINGS: No acute bony or joint abnormality is identified. There is no joint effusion. Patellofemoral degenerative change is noted.  IMPRESSION: No acute finding.   Electronically Signed   By: Inge Rise M.D.   On: 12/06/2012 00:35    MDM   1. Left anterior knee pain     Afebrile, NAD, non-toxic appearing, AAOx4. Neurovascularly intact. No sensory deficit from baseline per patient. Imaging shows no fracture. Directed pt to ice injury, take acetaminophen or ibuprofen for pain, and to elevate and rest the injury when possible. Ace wrapped knee for support and comfort. Provided back to work note for tomorrow. Advised PCP f/u in 1-2 days. Advised ortho follow up if not improving in 1 week with symptomatic care. Patient is agreeable to plan. Patient is stable at time of discharge       Harlow Mares, PA-C 12/06/12 0105

## 2012-12-06 NOTE — ED Provider Notes (Signed)
Medical screening examination/treatment/procedure(s) were performed by non-physician practitioner and as supervising physician I was immediately available for consultation/collaboration.  Virgel Manifold, MD 12/06/12 [id number]

## 2012-12-06 NOTE — ED Notes (Signed)
Pt was concerned about her BP and Dr. Wilson Singer was consulted who ordered metoprolol. Pt also asked for tylenol which was given. Pt discharged home in no acute distress. Ambulatory.

## 2012-12-13 ENCOUNTER — Other Ambulatory Visit: Payer: Self-pay | Admitting: Family Medicine

## 2012-12-14 ENCOUNTER — Other Ambulatory Visit: Payer: Self-pay

## 2012-12-14 MED ORDER — ATORVASTATIN CALCIUM 20 MG PO TABS: 20.0000 mg | ORAL_TABLET | Freq: Every day | ORAL | Status: DC

## 2012-12-14 MED ORDER — SERTRALINE HCL 100 MG PO TABS: ORAL_TABLET | ORAL | Status: DC

## 2013-03-30 ENCOUNTER — Other Ambulatory Visit: Payer: Self-pay | Admitting: Family Medicine

## 2013-05-07 ENCOUNTER — Other Ambulatory Visit: Payer: Self-pay | Admitting: Family Medicine

## 2013-06-18 ENCOUNTER — Telehealth: Payer: Self-pay | Admitting: Family Medicine

## 2013-06-18 DIAGNOSIS — I10 Essential (primary) hypertension: Secondary | ICD-10-CM

## 2013-06-18 MED ORDER — ATENOLOL 100 MG PO TABS: ORAL_TABLET | ORAL | Status: DC

## 2013-06-18 MED ORDER — ATORVASTATIN CALCIUM 20 MG PO TABS: ORAL_TABLET | ORAL | Status: DC

## 2013-06-18 MED ORDER — LISINOPRIL-HYDROCHLOROTHIAZIDE 20-25 MG PO TABS: 1.0000 | ORAL_TABLET | Freq: Every day | ORAL | Status: DC

## 2013-06-18 NOTE — Telephone Encounter (Signed)
Patient called to request refills for atenolol (TENORMIN) 100 MG tablet, atorvastatin (LIPITOR) 20 MG tablet, and lisinopril-hydrochlorothiazide (PRINZIDE,ZESTORETIC) 20-25 MG per tablet. Also patient states that she is completely out of her blood pressure medication and hoping to get this refilled today.  Pharmacy [place], [place] [address]

## 2013-06-18 NOTE — Telephone Encounter (Signed)
I advised the patient she will need a CPE in order to continue to receive her medications, she voiced understanding and said she was without a car at this time and could only come in on Friday. I scheduled her form May 29th and advised she needed to keep apt, I made her aware that she will get a call prior to the appointment and I was sending enough med's to get through. Med's sent.    KP

## 2013-07-24 ENCOUNTER — Telehealth: Payer: Self-pay | Admitting: Family Medicine

## 2013-07-24 ENCOUNTER — Telehealth: Payer: Self-pay

## 2013-07-24 DIAGNOSIS — I10 Essential (primary) hypertension: Secondary | ICD-10-CM

## 2013-07-24 MED ORDER — ATENOLOL 100 MG PO TABS: ORAL_TABLET | ORAL | Status: DC

## 2013-07-24 MED ORDER — ATORVASTATIN CALCIUM 20 MG PO TABS: ORAL_TABLET | ORAL | Status: DC

## 2013-07-24 MED ORDER — SERTRALINE HCL 100 MG PO TABS: ORAL_TABLET | ORAL | Status: DC

## 2013-07-24 MED ORDER — LISINOPRIL-HYDROCHLOROTHIAZIDE 20-25 MG PO TABS: 1.0000 | ORAL_TABLET | Freq: Every day | ORAL | Status: DC

## 2013-07-24 NOTE — Telephone Encounter (Signed)
States that she needs refills on maintenance meds. Advised patient to contact the pharmacy going forward. Patient states understanding.

## 2013-08-08 ENCOUNTER — Telehealth: Payer: Self-pay

## 2013-08-08 NOTE — Telephone Encounter (Signed)
Left message for call back Identifiable  Pap [facility]- 08/25/09 [facility]- Td

## 2013-08-09 ENCOUNTER — Encounter: Payer: Self-pay | Admitting: Family Medicine

## 2013-08-12 NOTE — Telephone Encounter (Signed)
Appointment cancelled

## 2013-09-20 ENCOUNTER — Other Ambulatory Visit: Payer: Self-pay | Admitting: Family Medicine

## 2013-09-29 ENCOUNTER — Other Ambulatory Visit: Payer: Self-pay | Admitting: Family Medicine

## 2013-09-30 NOTE — Telephone Encounter (Signed)
Rx's sent to the pharmacy by e-script.//AB/CMA 

## 2013-10-02 ENCOUNTER — Other Ambulatory Visit: Payer: Self-pay | Admitting: Family Medicine

## 2013-10-03 NOTE — Telephone Encounter (Signed)
Refill Request:  sertraline (ZOLOFT) 100 MG tablet TAKE 1 TABLET BY MOUTH DAILY  Last Filled: 07/24/13 Amt Filled:  30 tablets, 0 refills Last OV:  07/31/12 Med F/u scheduled for 10/11/13   Please advise.

## 2013-10-11 ENCOUNTER — Ambulatory Visit: Payer: Self-pay | Admitting: Family Medicine

## 2013-10-11 DIAGNOSIS — Z0289 Encounter for other administrative examinations: Secondary | ICD-10-CM

## 2013-10-21 ENCOUNTER — Ambulatory Visit: Payer: Self-pay | Admitting: Family Medicine

## 2013-10-21 ENCOUNTER — Other Ambulatory Visit: Payer: Self-pay | Admitting: Family Medicine

## 2013-10-22 ENCOUNTER — Telehealth: Payer: Self-pay

## 2013-10-22 MED ORDER — LISINOPRIL-HYDROCHLOROTHIAZIDE 20-25 MG PO TABS: ORAL_TABLET | ORAL | Status: DC

## 2013-10-22 MED ORDER — ATENOLOL 100 MG PO TABS: ORAL_TABLET | ORAL | Status: DC

## 2013-10-22 NOTE — Telephone Encounter (Signed)
Caller name: Cherly Relation to pt: Call back number: [phone] Pharmacy:  Reason for call: [facility] missed her appointment yesterday because she thought it was today. I have rescheduled her for next Friday the 21st. In the meantime she only has 2 pils left and she needs them called in.  ATENOLOL 100 MG PO TABS / LISINOPRIL-HYDROCHLOROTHIAZIDE 20-25 MG PO TABS

## 2013-10-22 NOTE — Telephone Encounter (Signed)
Rx faxed.    KP 

## 2013-10-29 ENCOUNTER — Ambulatory Visit: Payer: Self-pay | Admitting: Family Medicine

## 2013-11-01 ENCOUNTER — Ambulatory Visit (INDEPENDENT_AMBULATORY_CARE_PROVIDER_SITE_OTHER): Payer: Self-pay | Admitting: Family Medicine

## 2013-11-01 ENCOUNTER — Encounter: Payer: Self-pay | Admitting: Family Medicine

## 2013-11-01 VITALS — BP 152/86 | HR 57 | Temp 98.2°F | Wt 234.2 lb

## 2013-11-01 DIAGNOSIS — F32A Depression, unspecified: Secondary | ICD-10-CM

## 2013-11-01 DIAGNOSIS — E785 Hyperlipidemia, unspecified: Secondary | ICD-10-CM

## 2013-11-01 DIAGNOSIS — I10 Essential (primary) hypertension: Secondary | ICD-10-CM

## 2013-11-01 DIAGNOSIS — F329 Major depressive disorder, single episode, unspecified: Secondary | ICD-10-CM

## 2013-11-01 DIAGNOSIS — R350 Frequency of micturition: Secondary | ICD-10-CM

## 2013-11-01 DIAGNOSIS — F3289 Other specified depressive episodes: Secondary | ICD-10-CM

## 2013-11-01 LAB — POCT URINALYSIS DIPSTICK
Bilirubin, UA: NEGATIVE
Glucose, UA: NEGATIVE
Ketones, UA: NEGATIVE
Nitrite, UA: NEGATIVE
Protein, UA: NEGATIVE
Spec Grav, UA: 1.01
Urobilinogen, UA: 0.2
pH, UA: 6.5

## 2013-11-01 MED ORDER — SERTRALINE HCL 100 MG PO TABS: ORAL_TABLET | ORAL | Status: DC

## 2013-11-01 MED ORDER — ATENOLOL 100 MG PO TABS
ORAL_TABLET | ORAL | Status: DC
Start: 2013-11-01 — End: 2014-11-21

## 2013-11-01 MED ORDER — LISINOPRIL-HYDROCHLOROTHIAZIDE 20-25 MG PO TABS: ORAL_TABLET | ORAL | Status: DC

## 2013-11-01 NOTE — Progress Notes (Signed)
Pre visit review using our clinic review tool, if applicable. No additional management support is needed unless otherwise documented below in the visit note. 

## 2013-11-01 NOTE — Progress Notes (Signed)
  Subjective:    Patient here for follow-up of elevated blood pressure.  She is not exercising and is not adherent to a low-salt diet.  Blood pressure is well controlled at home. Cardiac symptoms: none. Patient denies: chest pain, chest pressure/discomfort, claudication, dyspnea, exertional chest pressure/discomfort, fatigue, irregular heart beat, lower extremity edema, near-syncope, orthopnea, palpitations, paroxysmal nocturnal dyspnea, syncope and tachypnea. Cardiovascular risk factors: dyslipidemia, hypertension, obesity (BMI >= 30 kg/m2) and sedentary lifestyle. Use of agents associated with hypertension: none. History of target organ damage: none.  Pt is also her to f/u depression and cholesterol.    The following portions of the patient's history were reviewed and updated as appropriate:  She  has a past medical history of Hypertension; Hyperlipidemia; and Depression. She  does not have any pertinent problems on file. She  has past surgical history that includes Lumbar fusion; Breast surgery; and Back surgery. Her family history includes Cancer in her maternal aunt and maternal grandmother; Cancer (age of onset: 14) in her father; Depression in her father; Diabetes in her brother; Hyperlipidemia in her mother; Hypertension in her mother; Stroke in her mother. She  reports that she has never smoked. She does not have any smokeless tobacco history on file. She reports that she does not drink alcohol or use illicit drugs. She has a current medication list which includes the following prescription(s): atenolol, atorvastatin, ibuprofen, lisinopril-hydrochlorothiazide, and sertraline. Current Outpatient Prescriptions on File Prior to Visit  Medication Sig Dispense Refill  . atorvastatin (LIPITOR) 20 MG tablet 1 tab by mouth daily--labs are due now  30 tablet  0  . ibuprofen (ADVIL,MOTRIN) 800 MG tablet Take 1 tablet (800 mg total) by mouth 3 (three) times daily.  21 tablet  0   No current  facility-administered medications on file prior to visit.   She has No Known Allergies..  Review of Systems Pertinent items are noted in HPI.     Objective:    BP 152/86  Pulse 57  Temp(Src) 98.2 F (36.8 C) (Oral)  Wt 234 lb 3.2 oz (106.232 kg)  SpO2 99% General appearance: alert, cooperative, appears stated age and no distress Lungs: clear to auscultation bilaterally Heart: S1, S2 normal Extremities: extremities normal, atraumatic, no cyanosis or edema    Assessment:    Hypertension, elevated today . Evidence of target organ damage: none.    Plan:    Medication: no change. Regular aerobic exercise. Check blood pressures 2-3 times weekly and record. Follow up: 3 months and as needed.   1. Essential hypertension   - Basic metabolic panel - lisinopril-hydrochlorothiazide (PRINZIDE,ZESTORETIC) 20-25 MG per tablet; TAKE 1 TABLET BY MOUTH DAILY.  Dispense: 90 tablet; Refill: 3 - atenolol (TENORMIN) 100 MG tablet; TAKE 1 TABLET BY MOUTH DAILY.  Dispense: 90 tablet; Refill: 3  2. Other and unspecified hyperlipidemia Check labs. con't meds  - Hepatic function panel - Lipid panel  3. Depression   - sertraline (ZOLOFT) 100 MG tablet; TAKE 1 TABLET BY MOUTH DAILY  Dispense: 90 tablet; Refill: 3  4. Urinary frequency   - POCT urinalysis dipstick

## 2013-11-01 NOTE — Patient Instructions (Signed)

## 2013-11-02 LAB — BASIC METABOLIC PANEL
BUN: 27 mg/dL — ABNORMAL HIGH (ref 6–23)
CO2: 26 mEq/L (ref 19–32)
Calcium: 12.8 mg/dL — ABNORMAL HIGH (ref 8.4–10.5)
Chloride: 101 mEq/L (ref 96–112)
Creat: 1.12 mg/dL — ABNORMAL HIGH (ref 0.50–1.10)
Glucose, Bld: 85 mg/dL (ref 70–99)
Potassium: 3.8 mEq/L (ref 3.5–5.3)
Sodium: 136 mEq/L (ref 135–145)

## 2013-11-02 LAB — HEPATIC FUNCTION PANEL
ALT: 23 U/L (ref 0–35)
AST: 21 U/L (ref 0–37)
Albumin: 4.2 g/dL (ref 3.5–5.2)
Alkaline Phosphatase: 87 U/L (ref 39–117)
Bilirubin, Direct: 0.1 mg/dL (ref 0.0–0.3)
Indirect Bilirubin: 0.3 mg/dL (ref 0.2–1.2)
Total Bilirubin: 0.4 mg/dL (ref 0.2–1.2)
Total Protein: 7.5 g/dL (ref 6.0–8.3)

## 2013-11-02 LAB — LIPID PANEL
Cholesterol: 311 mg/dL — ABNORMAL HIGH (ref 0–200)
HDL: 56 mg/dL (ref >39)
LDL Cholesterol: 201 mg/dL — ABNORMAL HIGH (ref 0–99)
Total CHOL/HDL Ratio: 5.6 Ratio
Triglycerides: 269 mg/dL — ABNORMAL HIGH (ref <150)
VLDL: 54 mg/dL — ABNORMAL HIGH (ref 0–40)

## 2013-11-04 NOTE — Addendum Note (Signed)
Addended by: Modena Morrow D on: 11/04/2013 02:27 PM   Modules accepted: Orders

## 2013-11-06 LAB — URINE CULTURE: Colony Count: >=100000

## 2013-11-11 DIAGNOSIS — R7989 Other specified abnormal findings of blood chemistry: Secondary | ICD-10-CM

## 2013-11-11 DIAGNOSIS — R799 Abnormal finding of blood chemistry, unspecified: Secondary | ICD-10-CM

## 2013-11-11 MED ORDER — ATORVASTATIN CALCIUM 40 MG PO TABS: ORAL_TABLET | ORAL | Status: DC

## 2013-11-20 ENCOUNTER — Telehealth: Payer: Self-pay | Admitting: Family Medicine

## 2013-11-20 NOTE — Telephone Encounter (Signed)
yes

## 2013-11-20 NOTE — Telephone Encounter (Signed)
Pt would like to know if dr. Etter Sjogren will accept her son as a new pt. Pt states she has spoken with her before regarding her son's thyroid issues.

## 2013-11-20 NOTE — Telephone Encounter (Signed)
Please advise      KP 

## 2013-11-21 NOTE — Telephone Encounter (Signed)
Left detailed message informing patient of this. °

## 2013-11-22 ENCOUNTER — Other Ambulatory Visit: Payer: Self-pay

## 2013-11-26 ENCOUNTER — Other Ambulatory Visit: Payer: Self-pay | Admitting: Family Medicine

## 2013-11-26 ENCOUNTER — Telehealth: Payer: Self-pay | Admitting: Family Medicine

## 2013-11-26 DIAGNOSIS — I10 Essential (primary) hypertension: Secondary | ICD-10-CM

## 2013-11-26 MED ORDER — LISINOPRIL-HYDROCHLOROTHIAZIDE 20-12.5 MG PO TABS: 2.0000 | ORAL_TABLET | Freq: Every day | ORAL | Status: DC

## 2013-11-26 NOTE — Telephone Encounter (Signed)
Caller name: [name]  Relation to pt: self  Call back number: [phone] Pharmacy:[facility] [phone]   Reason for call: pt in need of clarification under the impression the dosage of lisinopril-hydrochlorothiazide (PRINZIDE,ZESTORETIC) 20-25 MG was suppose to increse. Pt requesting a refill of  atenolol (TENORMIN) 100 MG tablet

## 2013-11-26 NOTE — Telephone Encounter (Signed)
Reviewed the medications and can not see where there was an increase. Please advise     KP

## 2013-11-26 NOTE — Telephone Encounter (Signed)
Change to 20/ 12.5  #60  1 po qd, 2 refills---bp recheck 2-3 weeks

## 2013-11-26 NOTE — Telephone Encounter (Signed)
Detailed message left advising the new Rx was faxed     KP

## 2013-11-29 ENCOUNTER — Other Ambulatory Visit: Payer: Self-pay

## 2013-12-29 ENCOUNTER — Other Ambulatory Visit: Payer: Self-pay | Admitting: Family Medicine

## 2014-04-14 ENCOUNTER — Other Ambulatory Visit: Payer: Self-pay | Admitting: Family Medicine

## 2014-04-15 ENCOUNTER — Other Ambulatory Visit: Payer: Self-pay

## 2014-05-20 ENCOUNTER — Other Ambulatory Visit: Payer: Self-pay | Admitting: Family Medicine

## 2014-06-02 ENCOUNTER — Other Ambulatory Visit: Payer: Self-pay | Admitting: Family Medicine

## 2014-07-04 ENCOUNTER — Other Ambulatory Visit: Payer: Self-pay | Admitting: Family Medicine

## 2014-07-04 NOTE — Telephone Encounter (Signed)
Please offer this patient an apt. For BP medication management.      KP

## 2014-07-09 NOTE — Telephone Encounter (Signed)
Letter mailed     KP 

## 2014-07-09 NOTE — Telephone Encounter (Signed)
LVM advising pt to call back to schedule appointment

## 2014-07-27 ENCOUNTER — Emergency Department (HOSPITAL_COMMUNITY)
Admission: EM | Admit: 2014-07-27 | Discharge: 2014-07-27 | Disposition: A | Discharge status: Home / Self Care | Payer: Self-pay | Attending: Emergency Medicine | Admitting: Emergency Medicine

## 2014-07-27 ENCOUNTER — Encounter (HOSPITAL_COMMUNITY): Payer: Self-pay | Admitting: Emergency Medicine

## 2014-07-27 DIAGNOSIS — E785 Hyperlipidemia, unspecified: Secondary | ICD-10-CM | POA: Insufficient documentation

## 2014-07-27 DIAGNOSIS — Z79899 Other long term (current) drug therapy: Secondary | ICD-10-CM | POA: Insufficient documentation

## 2014-07-27 DIAGNOSIS — H6121 Impacted cerumen, right ear: Secondary | ICD-10-CM | POA: Insufficient documentation

## 2014-07-27 DIAGNOSIS — I10 Essential (primary) hypertension: Secondary | ICD-10-CM | POA: Insufficient documentation

## 2014-07-27 DIAGNOSIS — F329 Major depressive disorder, single episode, unspecified: Secondary | ICD-10-CM | POA: Insufficient documentation

## 2014-07-27 HISTORY — DX: Other seasonal allergic rhinitis: J30.2

## 2014-07-27 MED ORDER — CARBAMIDE PEROXIDE 6.5 % OT SOLN: 10.0000 [drp] | Freq: Two times a day (BID) | OTIC | Status: DC

## 2014-07-27 NOTE — Discharge Instructions (Signed)
Cerumen Impaction A cerumen impaction is when the wax in your ear forms a plug. This plug usually causes reduced hearing. Sometimes it also causes an earache or dizziness. Removing a cerumen impaction can be difficult and painful. The wax sticks to the ear canal. The canal is sensitive and bleeds easily. If you try to remove a heavy wax buildup with a cotton tipped swab, you may push it in further. Irrigation with water, suction, and small ear curettes may be used to clear out the wax. If the impaction is fixed to the skin in the ear canal, ear drops may be needed for a few days to loosen the wax. People who build up a lot of wax frequently can use ear wax removal products available in your local drugstore. SEEK MEDICAL CARE IF:  You develop an earache, increased hearing loss, or marked dizziness. Document Released: 04/07/2004 Document Revised: 05/23/2011 Document Reviewed: 05/28/2009 [facility] Patient Information 2015 [place], [place]. This information is not intended to replace advice given to you by your health care provider. Make sure you discuss any questions you have with your health care provider.  Ear Drops You have been diagnosed with a condition requiring you to put drops of medicine into your outer ear. HOME CARE INSTRUCTIONS   Put drops in the affected ear as instructed. After putting the drops in, you will need to lie down with the affected ear facing up for ten minutes so the drops will remain in the ear canal and run down and fill the canal. Continue using the ear drops for as long as directed by your health care provider.  Prior to getting up, put a cotton ball gently in your ear canal. Leave enough of the cotton ball out so it can be easily removed. Do not attempt to push this down into the canal with a cotton-tipped swab or other instrument.  Do not irrigate or wash out your ears if you have had a perforated eardrum or mastoid surgery, or unless instructed to do so by your health care  provider.  Keep appointments with your health care provider as instructed.  Finish all medicine, or use for the length of time prescribed by your health care provider. Continue the drops even if your problem seems to be doing well after a couple days, or continue as instructed. SEEK MEDICAL CARE IF:  You become worse or develop increasing pain.  You notice any unusual drainage from your ear (particularly if the drainage has a bad smell).  You develop hearing difficulties.  You experience a serious form of dizziness in which you feel as if the room is spinning, and you feel nauseated (vertigo).  The outside of your ear becomes red or swollen or both. This may be a sign of an allergic reaction. MAKE SURE YOU:   Understand these instructions.  Will watch your condition.  Will get help right away if you are not doing well or get worse. Document Released: 02/22/2001 Document Revised: 03/05/2013 Document Reviewed: 09/25/2012 [facility] Patient Information 2015 [place], [place]. This information is not intended to replace advice given to you by your health care provider. Make sure you discuss any questions you have with your health care provider.  Managing Your High Blood Pressure Blood pressure is a measurement of how forceful your blood is pressing against the walls of the arteries. Arteries are muscular tubes within the circulatory system. Blood pressure does not stay the same. Blood pressure rises when you are active, excited, or nervous; and it  lowers during sleep and relaxation. If the numbers measuring your blood pressure stay above normal most of the time, you are at risk for health problems. High blood pressure (hypertension) is a long-term (chronic) condition in which blood pressure is elevated. A blood pressure reading is recorded as two numbers, such as 120 over 80 (or 120/80). The first, higher number is called the systolic pressure. It is a measure of the pressure in your arteries as  the heart beats. The second, lower number is called the diastolic pressure. It is a measure of the pressure in your arteries as the heart relaxes between beats.  Keeping your blood pressure in a normal range is important to your overall health and prevention of health problems, such as heart disease and stroke. When your blood pressure is uncontrolled, your heart has to work harder than normal. High blood pressure is a very common condition in adults because blood pressure tends to rise with age. Men and women are equally likely to have hypertension but at different times in life. Before age 29, men are more likely to have hypertension. After 59 years of age, women are more likely to have it. Hypertension is especially common in African Americans. This condition often has no signs or symptoms. The cause of the condition is usually not known. Your caregiver can help you come up with a plan to keep your blood pressure in a normal, healthy range. BLOOD PRESSURE STAGES Blood pressure is classified into four stages: normal, prehypertension, stage 1, and stage 2. Your blood pressure reading will be used to determine what type of treatment, if any, is necessary. Appropriate treatment options are tied to these four stages:  Normal  Systolic pressure (mm Hg): below 120.  Diastolic pressure (mm Hg): below 80. Prehypertension  Systolic pressure (mm Hg): 120 to 139.  Diastolic pressure (mm Hg): 80 to 89. Stage1  Systolic pressure (mm Hg): 140 to 159.  Diastolic pressure (mm Hg): 90 to 99. Stage2  Systolic pressure (mm Hg): 160 or above.  Diastolic pressure (mm Hg): 100 or above. RISKS RELATED TO HIGH BLOOD PRESSURE Managing your blood pressure is an important responsibility. Uncontrolled high blood pressure can lead to:  A heart attack.  A stroke.  A weakened blood vessel (aneurysm).  Heart failure.  Kidney damage.  Eye damage.  Metabolic syndrome.  Memory and concentration  problems. HOW TO MANAGE YOUR BLOOD PRESSURE Blood pressure can be managed effectively with lifestyle changes and medicines (if needed). Your caregiver will help you come up with a plan to bring your blood pressure within a normal range. Your plan should include the following: Education  Read all information provided by your caregivers about how to control blood pressure.  Educate yourself on the latest guidelines and treatment recommendations. New research is always being done to further define the risks and treatments for high blood pressure. Lifestylechanges  Control your weight.  Avoid smoking.  Stay physically active.  Reduce the amount of salt in your diet.  Reduce stress.  Control any chronic conditions, such as high cholesterol or diabetes.  Reduce your alcohol intake. Medicines  Several medicines (antihypertensive medicines) are available, if needed, to bring blood pressure within a normal range. Communication  Review all the medicines you take with your caregiver because there may be side effects or interactions.  Talk with your caregiver about your diet, exercise habits, and other lifestyle factors that may be contributing to high blood pressure.  See your caregiver regularly. Your caregiver can  help you create and adjust your plan for managing high blood pressure. RECOMMENDATIONS FOR TREATMENT AND FOLLOW-UP  The following recommendations are based on current guidelines for managing high blood pressure in nonpregnant adults. Use these recommendations to identify the proper follow-up period or treatment option based on your blood pressure reading. You can discuss these options with your caregiver.  Systolic pressure of 123456 to [id number] or diastolic pressure of 80 to 89: Follow up with your caregiver as directed.  Systolic pressure of XX123456 to 0000000 or diastolic pressure of 90 to 100: Follow up with your caregiver within 2 months.  Systolic pressure above 0000000 or diastolic  pressure above 123XX123: Follow up with your caregiver within 1 month.  Systolic pressure above 99991111 or diastolic pressure above [id number]: Consider antihypertensive therapy; follow up with your caregiver within 1 week.  Systolic pressure above A999333 or diastolic pressure above 123456: Begin antihypertensive therapy; follow up with your caregiver within 1 week. Document Released: 11/23/2011 Document Reviewed: 11/23/2011 [facility] Patient Information 2015 Deborah Figueroa. This information is not intended to replace advice given to you by your health care provider. Make sure you discuss any questions you have with your health care provider.

## 2014-07-27 NOTE — ED Notes (Signed)
Patient c/o R ear "blockage", states she is able to hear but her ear feels "full", reports history of recent sinus problems. Patient also c/o HTN, states she takes medication daily, but that it will fluctuate with stress.

## 2014-07-27 NOTE — ED Provider Notes (Signed)
CSN: [id number]     Arrival date & time 07/27/14  0013 History   First MD Initiated Contact with Patient 07/27/14 [phone]     Chief Complaint  Patient presents with  . ear blockage     Right, recent sinus symptoms, able to hear  . Hypertension     (Consider location/radiation/quality/duration/timing/severity/associated sxs/prior Treatment) HPI 59 year old female presents to the emergency part with complaint of blockage in her right ear started around 4 PM today.  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  Patient reports over the last several days she has had nasal congestion, postnasal drip.  She has been taking over-the-counter Zyrtec with improvement of symptoms.  Patient notes to have elevated blood pressure.  She reports that she is compliant with her blood pressure medications.  She reports that when she is under stress.  Her blood pressure goes up.  She denies any fever or chills.  No ear pain. Past Medical History  Diagnosis Date  . Hypertension   . Hyperlipidemia   . Depression   . Seasonal allergies    Past Surgical History  Procedure Laterality Date  . Lumbar fusion    . Breast surgery      lumpectomy left breast  . Back surgery     Family History  Problem Relation Age of Onset  . Stroke Mother   . Hypertension Mother   . Hyperlipidemia Mother   . Cancer Father 18    liver and colon  . Depression Father   . Cancer Maternal Aunt     breast  . Cancer Maternal Grandmother     breast  . Diabetes Brother    History  Substance Use Topics  . Smoking status: Never Smoker   . Smokeless tobacco: Not on file  . Alcohol Use: No   OB History    No data available     Review of Systems  See History of Present Illness; otherwise all other systems are reviewed and negative   Allergies  Review of patient's allergies indicates no known allergies.  Home Medications   Prior to Admission medications   Medication Sig  Start Date End Date Taking? Authorizing Provider  atenolol (TENORMIN) 100 MG tablet TAKE 1 TABLET BY MOUTH DAILY. 11/01/13  Yes Yvonne R Lowne, DO  atorvastatin (LIPITOR) 40 MG tablet Take 1 tablet (40 mg total) by mouth daily. Office visit with labs due now Patient taking differently: Take 40 mg by mouth every evening. Office visit with labs due now 06/03/14  Yes Rosalita Chessman, DO  lisinopril-hydrochlorothiazide (PRINZIDE,ZESTORETIC) 20-12.5 MG per tablet TAKE 2 TABLETS BY MOUTH DAILY. OFFICE VISIT DUE NOW 07/04/14  Yes Alferd Apa Lowne, DO  naproxen sodium (ANAPROX) 220 MG tablet Take 220 mg by mouth 2 (two) times daily as needed (for pain.).   Yes Historical Provider, MD  sertraline (ZOLOFT) 100 MG tablet TAKE 1 TABLET BY MOUTH DAILY 11/01/13  Yes Alferd Apa Lowne, DO  ibuprofen (ADVIL,MOTRIN) 800 MG tablet Take 1 tablet (800 mg total) by mouth 3 (three) times daily. Patient not taking: Reported on 07/27/2014 12/06/12   Anderson Malta Piepenbrink, PA-C   BP 173/107 mmHg  Pulse 72  Temp(Src) 98.3 F (36.8 C) (Oral)  Resp 20  SpO2 99% Physical Exam  Constitutional: She is oriented to person, place, and time. She appears well-developed and well-nourished.  HENT:  Head: Normocephalic and atraumatic.  Nose: Nose normal.  Mouth/Throat: Oropharynx is clear and moist.  Unable to visualize right TM secondary to cerumen.  Left TM is normal  Eyes: Conjunctivae and EOM are normal. Pupils are equal, round, and reactive to light.  Neck: Normal range of motion. Neck supple. No JVD present. No tracheal deviation present. No thyromegaly present.  Cardiovascular: Normal rate, regular rhythm, normal heart sounds and intact distal pulses.  Exam reveals no gallop and no friction rub.   No murmur heard. Pulmonary/Chest: Effort normal and breath sounds normal. No stridor. No respiratory distress. She has no wheezes. She has no rales. She exhibits no tenderness.  Abdominal: Soft. Bowel sounds are normal. She exhibits no  distension and no mass. There is no tenderness. There is no rebound and no guarding.  Musculoskeletal: Normal range of motion. She exhibits no edema or tenderness.  Lymphadenopathy:    She has no cervical adenopathy.  Neurological: She is alert and oriented to person, place, and time. She displays normal reflexes. She exhibits normal muscle tone. Coordination normal.  Skin: Skin is warm and dry. No rash noted. No erythema. No pallor.  Psychiatric: She has a normal mood and affect. Her behavior is normal. Judgment and thought content normal.  Nursing note and vitals reviewed.   ED Course  Procedures (including critical care time) Labs Review Labs Reviewed - No data to display  Imaging Review No results found.   EKG Interpretation None      MDM   Final diagnoses:  Cerumen impaction, right  Essential hypertension    59 year old female with decreased hearing in her right ear.  She has cerumen blocking her right TM.  Plan to have the nursing staff irrigate and remove the cerumen impaction, and will reassess.  4:00 AM Little improvement with irrigation.  Using an ear curette, I was able to remove approximately half of the cerumen.  She still has a fair amount.  The posterior aspect is very hard and adherent to the ear, canal wall.  I am also concerned that it is up against the TM.  Plan to have patient use D Brox, follow-up with primary care doctor and/or ENT this week for recheck.  Linton Flemings, MD 07/27/14 [phone]

## 2014-08-06 ENCOUNTER — Telehealth: Payer: Self-pay

## 2014-08-06 NOTE — Telephone Encounter (Signed)
LM for patient to call HM chart update

## 2014-08-08 NOTE — Telephone Encounter (Signed)
Unable to reach.

## 2014-08-30 ENCOUNTER — Other Ambulatory Visit: Payer: Self-pay | Admitting: Family Medicine

## 2014-09-01 NOTE — Telephone Encounter (Signed)
MSG left for the patient to call the office, she is due for an Office visit/CPE is due.      KP

## 2014-09-18 ENCOUNTER — Telehealth: Payer: Self-pay

## 2014-09-18 MED ORDER — ATORVASTATIN CALCIUM 40 MG PO TABS: 40.0000 mg | ORAL_TABLET | Freq: Every day | ORAL | Status: DC

## 2014-09-18 MED ORDER — LISINOPRIL-HYDROCHLOROTHIAZIDE 20-12.5 MG PO TABS: 2.0000 | ORAL_TABLET | Freq: Every day | ORAL | Status: DC

## 2014-09-18 NOTE — Telephone Encounter (Signed)
Rx sent for a 2 week supply.     KP

## 2014-09-18 NOTE — Telephone Encounter (Signed)
2 week supply and make app

## 2014-09-18 NOTE — Telephone Encounter (Signed)
RF requested for Lisinopril and Lipitor. Patient has not been seen since 11/01/13 No pending apt and a letter was mailed 06/2014 to advise for CPE. Called patient and left a message to cal.   Please advise if refill approriate.     KP

## 2014-11-08 ENCOUNTER — Ambulatory Visit: Payer: Self-pay | Admitting: Family Medicine

## 2014-11-21 ENCOUNTER — Other Ambulatory Visit: Payer: Self-pay | Admitting: Family Medicine

## 2014-11-21 ENCOUNTER — Encounter: Payer: Self-pay | Admitting: Family Medicine

## 2014-11-21 ENCOUNTER — Ambulatory Visit (INDEPENDENT_AMBULATORY_CARE_PROVIDER_SITE_OTHER): Payer: Self-pay | Admitting: Family Medicine

## 2014-11-21 VITALS — BP 132/88 | HR 67 | Temp 99.5°F | Wt 233.6 lb

## 2014-11-21 DIAGNOSIS — R82998 Other abnormal findings in urine: Secondary | ICD-10-CM

## 2014-11-21 DIAGNOSIS — N390 Urinary tract infection, site not specified: Secondary | ICD-10-CM

## 2014-11-21 DIAGNOSIS — I10 Essential (primary) hypertension: Secondary | ICD-10-CM

## 2014-11-21 DIAGNOSIS — E785 Hyperlipidemia, unspecified: Secondary | ICD-10-CM

## 2014-11-21 DIAGNOSIS — R102 Pelvic and perineal pain: Secondary | ICD-10-CM

## 2014-11-21 DIAGNOSIS — R300 Dysuria: Secondary | ICD-10-CM

## 2014-11-21 LAB — CBC WITH DIFFERENTIAL/PLATELET
Basophils Absolute: 0 10*3/uL (ref 0.0–0.1)
Basophils Relative: 0.8 % (ref 0.0–3.0)
Eosinophils Absolute: 0.2 10*3/uL (ref 0.0–0.7)
Eosinophils Relative: 3 % (ref 0.0–5.0)
HCT: 35.3 % — ABNORMAL LOW (ref 36.0–46.0)
Hemoglobin: 11.8 g/dL — ABNORMAL LOW (ref 12.0–15.0)
Lymphocytes Relative: 12.6 % (ref 12.0–46.0)
Lymphs Abs: 0.7 10*3/uL (ref 0.7–4.0)
MCHC: 33.6 g/dL (ref 30.0–36.0)
MCV: 86.3 fl (ref 78.0–100.0)
Monocytes Absolute: 0.4 10*3/uL (ref 0.1–1.0)
Monocytes Relative: 6.9 % (ref 3.0–12.0)
Neutro Abs: 4.2 10*3/uL (ref 1.4–7.7)
Neutrophils Relative %: 76.7 % (ref 43.0–77.0)
Platelets: 212 10*3/uL (ref 150.0–400.0)
RBC: 4.08 Mil/uL (ref 3.87–5.11)
RDW: 14.4 % (ref 11.5–15.5)
WBC: 5.5 10*3/uL (ref 4.0–10.5)

## 2014-11-21 LAB — BASIC METABOLIC PANEL
BUN: 27 mg/dL — ABNORMAL HIGH (ref 6–23)
CO2: 28 mEq/L (ref 19–32)
Calcium: 12.9 mg/dL — ABNORMAL HIGH (ref 8.4–10.5)
Chloride: 104 mEq/L (ref 96–112)
Creatinine, Ser: 1.2 mg/dL (ref 0.40–1.20)
GFR: 48.81 mL/min — ABNORMAL LOW (ref >60.00)
Glucose, Bld: 127 mg/dL — ABNORMAL HIGH (ref 70–99)
Potassium: 4.6 mEq/L (ref 3.5–5.1)
Sodium: 139 mEq/L (ref 135–145)

## 2014-11-21 LAB — POCT URINALYSIS DIPSTICK
Bilirubin, UA: NEGATIVE
Glucose, UA: NEGATIVE
Ketones, UA: NEGATIVE
Nitrite, UA: NEGATIVE
Protein, UA: NEGATIVE
Spec Grav, UA: 1.025
Urobilinogen, UA: 2
pH, UA: 6

## 2014-11-21 LAB — LIPID PANEL
Cholesterol: 313 mg/dL — ABNORMAL HIGH (ref 0–200)
HDL: 50.8 mg/dL (ref >39.00)
NonHDL: 262.59
Total CHOL/HDL Ratio: 6
Triglycerides: 265 mg/dL — ABNORMAL HIGH (ref 0.0–149.0)
VLDL: 53 mg/dL — ABNORMAL HIGH (ref 0.0–40.0)

## 2014-11-21 LAB — HEPATIC FUNCTION PANEL
ALT: 26 U/L (ref 0–35)
AST: 21 U/L (ref 0–37)
Albumin: 4.1 g/dL (ref 3.5–5.2)
Alkaline Phosphatase: 91 U/L (ref 39–117)
Bilirubin, Direct: 0.1 mg/dL (ref 0.0–0.3)
Total Bilirubin: 0.4 mg/dL (ref 0.2–1.2)
Total Protein: 7 g/dL (ref 6.0–8.3)

## 2014-11-21 LAB — LDL CHOLESTEROL, DIRECT: Direct LDL: 82 mg/dL

## 2014-11-21 MED ORDER — LISINOPRIL-HYDROCHLOROTHIAZIDE 20-12.5 MG PO TABS: 2.0000 | ORAL_TABLET | Freq: Every day | ORAL | Status: DC

## 2014-11-21 MED ORDER — CIPROFLOXACIN HCL 500 MG PO TABS: 500.0000 mg | ORAL_TABLET | Freq: Two times a day (BID) | ORAL | Status: DC

## 2014-11-21 MED ORDER — ATENOLOL 100 MG PO TABS: ORAL_TABLET | ORAL | Status: DC

## 2014-11-21 NOTE — Progress Notes (Signed)
  Subjective:    Deborah Figueroa is a 59 y.o. female who complains of frequency, suprapubic pressure and urgency. She has had symptoms for several weeks. Patient also complains of back pain. Patient denies congestion, cough, fever, headache, rhinitis, sorethroat, stomach ache and vaginal discharge. Patient does not have a history of recurrent UTI. Patient does not have a history of pyelonephritis.   The following portions of the patient's history were reviewed and updated as appropriate: allergies, current medications, past family history, past medical history, past social history, past surgical history and problem list.  Review of Systems Pertinent items are noted in HPI.    Objective:    BP 132/88 mmHg  Pulse 67  Temp(Src) 99.5 F (37.5 C) (Oral)  Wt 233 lb 9.6 oz (105.96 kg)  SpO2 98% General appearance: alert, cooperative, appears stated age and no distress Lungs: clear to auscultation bilaterally Heart: S1, S2 normal Abdomen: abnormal findings:  mild tenderness in the lower abdomen  Laboratory:  Urine dipstick: mod for leukocyte esterase.   Micro exam: not done.    Assessment:    UTI     Plan:    Medications: ciprofloxacin. Maintain adequate hydration. Follow up if symptoms not improving, and as needed.

## 2014-11-21 NOTE — Progress Notes (Signed)
Pre visit review using our clinic review tool, if applicable. No additional management support is needed unless otherwise documented below in the visit note. 

## 2014-11-21 NOTE — Telephone Encounter (Signed)
Patient was seen today, her dose in her chart is 20-12.5 but the pharmacy has been giving the patient Lisinopril 20-25. Please advise      KP

## 2014-11-21 NOTE — Patient Instructions (Signed)

## 2014-11-22 LAB — URINE CULTURE: Colony Count: >=100000

## 2014-11-24 ENCOUNTER — Other Ambulatory Visit: Payer: Self-pay | Admitting: Family Medicine

## 2014-12-03 ENCOUNTER — Other Ambulatory Visit: Payer: Self-pay

## 2014-12-03 MED ORDER — ATORVASTATIN CALCIUM 40 MG PO TABS: 40.0000 mg | ORAL_TABLET | Freq: Every day | ORAL | Status: DC

## 2015-01-04 ENCOUNTER — Emergency Department (HOSPITAL_COMMUNITY): Payer: Self-pay

## 2015-01-04 ENCOUNTER — Encounter (HOSPITAL_COMMUNITY): Payer: Self-pay | Admitting: Emergency Medicine

## 2015-01-04 ENCOUNTER — Emergency Department (HOSPITAL_COMMUNITY)
Admission: EM | Admit: 2015-01-04 | Discharge: 2015-01-04 | Disposition: A | Discharge status: Home / Self Care | Payer: Self-pay | Attending: Emergency Medicine | Admitting: Emergency Medicine

## 2015-01-04 DIAGNOSIS — Y998 Other external cause status: Secondary | ICD-10-CM | POA: Insufficient documentation

## 2015-01-04 DIAGNOSIS — W19XXXA Unspecified fall, initial encounter: Secondary | ICD-10-CM

## 2015-01-04 DIAGNOSIS — S80211A Abrasion, right knee, initial encounter: Secondary | ICD-10-CM | POA: Insufficient documentation

## 2015-01-04 DIAGNOSIS — Z79899 Other long term (current) drug therapy: Secondary | ICD-10-CM | POA: Insufficient documentation

## 2015-01-04 DIAGNOSIS — Y92480 Sidewalk as the place of occurrence of the external cause: Secondary | ICD-10-CM | POA: Insufficient documentation

## 2015-01-04 DIAGNOSIS — I10 Essential (primary) hypertension: Secondary | ICD-10-CM | POA: Insufficient documentation

## 2015-01-04 DIAGNOSIS — Z23 Encounter for immunization: Secondary | ICD-10-CM | POA: Insufficient documentation

## 2015-01-04 DIAGNOSIS — Y9389 Activity, other specified: Secondary | ICD-10-CM | POA: Insufficient documentation

## 2015-01-04 DIAGNOSIS — S0990XA Unspecified injury of head, initial encounter: Secondary | ICD-10-CM | POA: Insufficient documentation

## 2015-01-04 DIAGNOSIS — F329 Major depressive disorder, single episode, unspecified: Secondary | ICD-10-CM | POA: Insufficient documentation

## 2015-01-04 DIAGNOSIS — W101XXA Fall (on)(from) sidewalk curb, initial encounter: Secondary | ICD-10-CM | POA: Insufficient documentation

## 2015-01-04 DIAGNOSIS — Z8709 Personal history of other diseases of the respiratory system: Secondary | ICD-10-CM | POA: Insufficient documentation

## 2015-01-04 DIAGNOSIS — E785 Hyperlipidemia, unspecified: Secondary | ICD-10-CM | POA: Insufficient documentation

## 2015-01-04 DIAGNOSIS — S0081XA Abrasion of other part of head, initial encounter: Secondary | ICD-10-CM | POA: Insufficient documentation

## 2015-01-04 DIAGNOSIS — E669 Obesity, unspecified: Secondary | ICD-10-CM | POA: Insufficient documentation

## 2015-01-04 LAB — URINALYSIS, ROUTINE W REFLEX MICROSCOPIC
Bilirubin Urine: NEGATIVE
Glucose, UA: NEGATIVE mg/dL
Ketones, ur: NEGATIVE mg/dL
Nitrite: NEGATIVE
Protein, ur: NEGATIVE mg/dL
Specific Gravity, Urine: 1.007 (ref 1.005–1.030)
Urobilinogen, UA: 0.2 mg/dL (ref 0.0–1.0)
pH: 6.5 (ref 5.0–8.0)

## 2015-01-04 LAB — URINE MICROSCOPIC-ADD ON

## 2015-01-04 MED ORDER — HYDROGEN PEROXIDE 3 % EX SOLN
CUTANEOUS | Status: AC
  Administered 2015-01-04: 1
  Filled 2015-01-04: qty 473

## 2015-01-04 MED ORDER — BACITRACIN ZINC 500 UNIT/GM EX OINT
TOPICAL_OINTMENT | Freq: Two times a day (BID) | CUTANEOUS | Status: DC
  Administered 2015-01-04: 1 via TOPICAL
  Filled 2015-01-04: qty 1.8

## 2015-01-04 MED ORDER — IBUPROFEN 800 MG PO TABS: 800.0000 mg | ORAL_TABLET | Freq: Three times a day (TID) | ORAL | Status: DC

## 2015-01-04 MED ORDER — ONDANSETRON 4 MG PO TBDP
4.0000 mg | ORAL_TABLET | Freq: Once | ORAL | Status: AC
  Administered 2015-01-04: 4 mg via ORAL
  Filled 2015-01-04: qty 1

## 2015-01-04 MED ORDER — TETANUS-DIPHTH-ACELL PERTUSSIS 5-2.5-18.5 LF-MCG/0.5 IM SUSP
0.5000 mL | Freq: Once | INTRAMUSCULAR | Status: AC
  Administered 2015-01-04: 0.5 mL via INTRAMUSCULAR
  Filled 2015-01-04: qty 0.5

## 2015-01-04 MED ORDER — OXYCODONE-ACETAMINOPHEN 5-325 MG PO TABS
1.0000 | ORAL_TABLET | Freq: Once | ORAL | Status: AC
  Administered 2015-01-04: 1 via ORAL
  Filled 2015-01-04: qty 1

## 2015-01-04 NOTE — ED Provider Notes (Signed)
CSN: [id number]     Arrival date & time 01/04/15  1549 History   First MD Initiated Contact with Patient 01/04/15 1552     Chief Complaint  Patient presents with  . Fall     (Consider location/radiation/quality/duration/timing/severity/associated sxs/prior Treatment) HPI Ms. Sarr is a 59 year old female with a history of back surgery, hypertension, hyperlipidemia, and depression who presents by EMS for trip and fall over a curb outside of [employer] trying to save herself with both or her hands but hit her head.  She denies any loss of consciousness. She was able to get up and sit on the sidewalk but felt slightly dizzy at that time. She is unaware of her tetanus status. She is complaining of facial pain and body aches. She denies any blurry vision or vision changes, headache, neck pain, back pain.  Past Medical History  Diagnosis Date  . Hypertension   . Hyperlipidemia   . Depression   . Seasonal allergies    Past Surgical History  Procedure Laterality Date  . Lumbar fusion    . Breast surgery      lumpectomy left breast  . Back surgery     Family History  Problem Relation Age of Onset  . Stroke Mother   . Hypertension Mother   . Hyperlipidemia Mother   . Cancer Father 7    liver and colon  . Depression Father   . Cancer Maternal Aunt     breast  . Cancer Maternal Grandmother     breast  . Diabetes Brother    Social History  Substance Use Topics  . Smoking status: Never Smoker   . Smokeless tobacco: None  . Alcohol Use: No   OB History    No data available     Review of Systems  Eyes: Negative for visual disturbance.  Gastrointestinal: Negative for vomiting and abdominal pain.  Musculoskeletal: Negative for arthralgias and gait problem.  Skin: Positive for wound.  Neurological: Negative for syncope, light-headedness and headaches.  All other systems reviewed and are negative.     Allergies  Review of patient's allergies indicates no known  allergies.  Home Medications   Prior to Admission medications   Medication Sig Start Date End Date Taking? Authorizing Provider  atenolol (TENORMIN) 100 MG tablet TAKE 1 TABLET BY MOUTH DAILY. Patient taking differently: Take 100 mg by mouth daily.  11/21/14  Yes Yvonne R Lowne, DO  atorvastatin (LIPITOR) 40 MG tablet Take 1 tablet (40 mg total) by mouth daily. 12/03/14  Yes Rosalita Chessman, DO  lisinopril-hydrochlorothiazide (PRINZIDE,ZESTORETIC) 20-12.5 MG per tablet Take 2 tablets by mouth daily. 11/21/14  Yes Yvonne R Lowne, DO  naproxen sodium (ANAPROX) 220 MG tablet Take 220 mg by mouth 2 (two) times daily as needed (for pain.).   Yes Historical Provider, MD  sertraline (ZOLOFT) 100 MG tablet TAKE 1 TABLET (100 MG TOTAL) BY MOUTH DAILY. OFFICE VISIT DUE NOW 11/25/14  Yes Alferd Apa Lowne, DO  carbamide peroxide (DEBROX) 6.5 % otic solution Place 10 drops into the right ear 2 (two) times daily. Patient not taking: Reported on 01/04/2015 07/27/14   Linton Flemings, MD  ciprofloxacin (CIPRO) 500 MG tablet Take 1 tablet (500 mg total) by mouth 2 (two) times daily. Patient not taking: Reported on 01/04/2015 11/21/14   Rosalita Chessman, DO  ibuprofen (ADVIL,MOTRIN) 800 MG tablet Take 1 tablet (800 mg total) by mouth 3 (three) times daily. 01/04/15   Kiya Eno Patel-Mills, PA-C   BP 160/89 mmHg  Pulse 67  Temp(Src) 98.2 F (36.8 C) (Oral)  Resp 18  SpO2 97% Physical Exam  Constitutional: She is oriented to person, place, and time. Vital signs are normal. She appears well-developed and well-nourished.  Obese.  HENT:  Head: Normocephalic. Head is with abrasion. Head is without raccoon's eyes, without laceration and without left periorbital erythema. Hair is normal.  Mouth/Throat: Oropharynx is clear and moist and mucous membranes are normal. She does not have dentures. Normal dentition.  Right facial abrasion below the right eye with no active bleeding. No ecchymosis. No raccoon eyes. No deformity noted.  No  fractured teeth. No dental pain. No bleeding of the gums.  Eyes: Conjunctivae are normal.  Neck: Normal range of motion and full passive range of motion without pain. Neck supple.  Able to flex and extend neck without difficulty. No midline cervical, or lumbar tenderness to palpation.   Cardiovascular: Normal rate, regular rhythm and normal heart sounds.   Pulmonary/Chest: Effort normal and breath sounds normal.  Abdominal: Soft. There is no tenderness.  Abdomen is soft and nontender.  Musculoskeletal: Normal range of motion.  Neurological: She is alert and oriented to person, place, and time. She has normal strength. She is not disoriented. No sensory deficit. GCS eye subscore is 4. GCS verbal subscore is 5. GCS motor subscore is 6.  Cranial nerves III through XII intact. No motor or sensory deficits. She was ambulatory with steady gait while I walked her.  Abrasion to the right knee. She was able to flex and extend her bilateral lower and upper extremities without difficulty.  No abrasions to the eye lateral upper extremities.  Skin: Skin is warm and dry.  Bilateral hands and wrists are without abrasions, lacerations, erythema, or bleeding.  Nursing note and vitals reviewed.   ED Course  Procedures (including critical care time) Labs Review Labs Reviewed  URINALYSIS, ROUTINE W REFLEX MICROSCOPIC (NOT AT [facility]) - Abnormal; Notable for the following:    Hgb urine dipstick TRACE (*)    Leukocytes, UA SMALL (*)    All other components within normal limits  URINE MICROSCOPIC-ADD ON    Imaging Review Dg Wrist Complete Right  01/04/2015  CLINICAL DATA:  Tripped on an uneven sidewalk and fell. Pain and swelling at the distal ulna region of the right wrist. Initial encounter. EXAM: RIGHT WRIST - COMPLETE 3+ VIEW COMPARISON:  None. FINDINGS: No acute fracture or dislocation is identified. Degenerative changes are noted at the first [facility] joint including joint space narrowing, subchondral  sclerosis, and subchondral cyst formation. No soft tissue abnormality is seen. IMPRESSION: 1. No acute osseous abnormality identified. 2. 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 Electronically Signed   By: Logan Bores M.D.   On: 01/04/2015 18:33   Ct Maxillofacial Wo Cm  01/04/2015  CLINICAL DATA:  Tripped and fell with right facial abrasions, initial encounter EXAM: CT MAXILLOFACIAL WITHOUT CONTRAST TECHNIQUE: Multidetector CT imaging of the maxillofacial structures was performed. Multiplanar CT image reconstructions were also generated. A small metallic BB was placed on the right temple in order to reliably differentiate right from left. COMPARISON:  None. FINDINGS: No acute bony fracture is identified. Some dental caries are identified as well as lucency surrounding the first right mandibular molar similar changes are noted in the right first maxillary molar. Some mucosal thickening is noted within the right maxillary antrum which may be reactive to the changes in the maxilla. Mild soft tissue swelling is noted in the infraorbital region on the right consistent  with the recent injury. Again no fractures are seen. IMPRESSION: No acute bony fracture noted. Mild soft tissue swelling in the right infra orbital region consistent with the given clinical history. Dental disease in the maxilla and mandible on the right as described. These may represent periapical abscesses. Some likely reactive changes in the right maxillary antrum are noted. Electronically Signed   By: Inez Catalina M.D.   On: 01/04/2015 17:41   I have personally reviewed and evaluated these image results as part of my medical decision-making.   EKG Interpretation None      MDM   Final diagnoses:  Fall, initial encounter  Knee abrasion, right, initial encounter  Patient presents for facial abrasion and head injury after trip and fall. She is well-appearing and in no acute distress. Her vital signs are stable. She was able to  ambulate and has a normal neuro exam. We will obtain a maxillofacial CT to look for fracture.  CT maxillofacial is negative for acute bone fracture but show some mild soft tissue swelling in the right infraorbital region. I discussed this with the patient. She tells me that she is having right wrist pain. Xray of wrist is negative for fracture or dislocation.  I discussed RICE for her right knee and she was given for  Medications  bacitracin ointment (1 application Topical Given 01/04/15 1844)  oxyCODONE-acetaminophen (PERCOCET/ROXICET) 5-325 MG per tablet 1 tablet (1 tablet Oral Given 01/04/15 1838)  ondansetron (ZOFRAN-ODT) disintegrating tablet 4 mg (4 mg Oral Given 01/04/15 1716)  Tdap (BOOSTRIX) injection 0.5 mL (0.5 mLs Intramuscular Given 01/04/15 1838)  hydrogen peroxide 3 % external solution (1 application  Given [id number])  I discussed follow up and return precautions with patient.  Rx: ibuprofen. She verbally agrees with the plan.  After the patient was taken to the lobby she stated that she wanted her urine checked but is not having urinary symptoms. Her urine was negative for UTI. I called the patient on her cell phone to explain the results. She stated she would call her GYN office.     Ottie Glazier, PA-C 01/04/15 2155  Nat Christen, MD 01/04/15 2217

## 2015-01-04 NOTE — ED Notes (Signed)
Attempted to give patient pain medication, patient requested information about plan first. When this RN returned, pt had been transported to x-ray.

## 2015-01-04 NOTE — ED Notes (Signed)
Patient transported to CT 

## 2015-01-04 NOTE — Discharge Instructions (Signed)
Abrasion Follow-up with your primary care physician. An abrasion is a cut or scrape on the surface of your skin. An abrasion does not go through all of the layers of your skin. It is important to take good care of your abrasion to prevent infection. HOME CARE Medicines  Take or apply medicines only as told by your doctor.  If you were prescribed an antibiotic ointment, finish all of it even if you start to feel better. Wound Care  Clean the wound with mild soap and water 2-3 times per day or as told by your doctor. Pat your wound dry with a clean towel. Do not rub it.  There are many ways to close and cover a wound. Follow instructions from your doctor about:  How to take care of your wound.  When and how you should change your bandage (dressing).  When and how you should take off your dressing.  Check your wound every day for signs of infection. Watch for:  Redness, swelling, or pain.  Fluid, blood, or pus. General Instructions  Keep the dressing dry as told by your doctor. Do not take baths, swim, use a hot tub, or do anything that would put your wound underwater until your doctor says it is okay.  If there is swelling, raise (elevate) the injured area above the level of your heart while you are sitting or lying down.  Keep all follow-up visits as told by your doctor. This is important. GET HELP IF:  You were given a tetanus shot and you have any of these where the needle went in:  Swelling.  Very bad pain.  Redness.  Bleeding.  Medicine does not help your pain.  You have any of these at the site of the wound:  More redness.  More swelling.  More pain. GET HELP RIGHT AWAY IF:  You have a red streak going away from your wound.  You have a fever.  You have fluid, blood, or pus coming from your wound.  There is a bad smell coming from your wound.   This information is not intended to replace advice given to you by your health care provider. Make sure you  discuss any questions you have with your health care provider.   Document Released: 08/17/2007 Document Revised: 07/15/2014 Document Reviewed: 02/26/2014 Elsevier Interactive Patient Education [employer].

## 2015-01-04 NOTE — ED Notes (Signed)
Bed: [id number] Expected date: 01/04/15 Expected time:  Means of arrival:  Comments: fall

## 2015-01-04 NOTE — ED Notes (Signed)
Pt's phone number is [phone]

## 2015-01-04 NOTE — ED Notes (Signed)
Per EMS pt tripped on uneven sidewalk and fell, abrasion to right face below eye, was ambulatory on scene but needed transport to hospital.

## 2015-01-04 NOTE — ED Notes (Signed)
Attempted to give patient pain medication, patient requested to use bathroom first.

## 2015-01-04 NOTE — ED Notes (Signed)
Attempted to give patient pain medication, patient wanted information and when this RN returned, pt had been transported to CT.

## 2015-01-09 ENCOUNTER — Telehealth: Payer: Self-pay | Admitting: Family Medicine

## 2015-01-09 NOTE — Telephone Encounter (Signed)
There  Is no inhaler on her list. Please advise     KP

## 2015-01-09 NOTE — Telephone Encounter (Signed)
Caller name: Bezawit Brummer  Relationship to patient: Self  Can be reached: [phone]  Pharmacy: [facility], [address]  Reason for call: Pt called in requesting a refill on her inhaler. Asked pt whats the name of it, she says that she dont know and she dont have access to find out. She says that provider should know. Pt says that she needs it for the weekend.

## 2015-01-10 ENCOUNTER — Other Ambulatory Visit: Payer: Self-pay | Admitting: Family Medicine

## 2015-01-10 DIAGNOSIS — J4520 Mild intermittent asthma, uncomplicated: Secondary | ICD-10-CM

## 2015-01-10 MED ORDER — ALBUTEROL SULFATE HFA 108 (90 BASE) MCG/ACT IN AERS: 2.0000 | INHALATION_SPRAY | Freq: Four times a day (QID) | RESPIRATORY_TRACT | Status: DC | PRN

## 2015-01-10 NOTE — Telephone Encounter (Signed)
There is no record of an inhaler on file--- pt needs to know her meds I saw she had neb tx with albuterol so I sent proair to pharmacy

## 2015-02-26 ENCOUNTER — Telehealth: Payer: Self-pay

## 2015-02-27 ENCOUNTER — Encounter: Payer: Self-pay | Admitting: Family Medicine

## 2015-02-27 ENCOUNTER — Telehealth: Payer: Self-pay | Admitting: Family Medicine

## 2015-02-27 DIAGNOSIS — Z0289 Encounter for other administrative examinations: Secondary | ICD-10-CM

## 2015-03-03 ENCOUNTER — Other Ambulatory Visit: Payer: Self-pay

## 2015-03-03 DIAGNOSIS — I10 Essential (primary) hypertension: Secondary | ICD-10-CM

## 2015-03-03 MED ORDER — LISINOPRIL-HYDROCHLOROTHIAZIDE 20-12.5 MG PO TABS: 2.0000 | ORAL_TABLET | Freq: Every day | ORAL | Status: DC

## 2015-03-03 NOTE — Telephone Encounter (Signed)
charge 

## 2015-03-03 NOTE — Telephone Encounter (Signed)
Pt was no show 02/27/15 3:30pm for cpe appt, pt has not rescheduled, charge or no charge?

## 2015-03-13 NOTE — Telephone Encounter (Signed)
Left message fore call regarding pre-visit information

## 2015-03-25 ENCOUNTER — Telehealth: Payer: Self-pay | Admitting: Behavioral Health

## 2015-03-25 NOTE — Telephone Encounter (Signed)
Assessed the following care gaps: Mammogram Pap Colonoscopy (per health maintenance, completed 08/25/09) Flu  Attempted to reach the patient to schedule CPE with PCP. Unable to leave a message at this time; per recording, the patient's voice mailbox is full. Will follow-up.

## 2015-04-30 ENCOUNTER — Other Ambulatory Visit: Payer: Self-pay

## 2015-04-30 DIAGNOSIS — I10 Essential (primary) hypertension: Secondary | ICD-10-CM

## 2015-04-30 MED ORDER — LISINOPRIL-HYDROCHLOROTHIAZIDE 20-12.5 MG PO TABS
2.0000 | ORAL_TABLET | Freq: Every day | ORAL | Status: DC
Start: 2015-04-30 — End: 2015-11-01

## 2015-05-12 ENCOUNTER — Telehealth: Payer: Self-pay | Admitting: Family Medicine

## 2015-05-12 NOTE — Telephone Encounter (Signed)
Called pt regarding flu shot, pt states she want to get the shot however she has no insurance right now and will get back with [place].

## 2015-06-24 ENCOUNTER — Ambulatory Visit: Payer: Self-pay | Admitting: Allergy and Immunology

## 2015-07-29 ENCOUNTER — Emergency Department (HOSPITAL_COMMUNITY)
Admission: EM | Admit: 2015-07-29 | Discharge: 2015-07-30 | Disposition: A | Discharge status: Home / Self Care | Payer: Self-pay | Attending: Emergency Medicine | Admitting: Emergency Medicine

## 2015-07-29 ENCOUNTER — Encounter (HOSPITAL_COMMUNITY): Payer: Self-pay | Admitting: Emergency Medicine

## 2015-07-29 DIAGNOSIS — L03116 Cellulitis of left lower limb: Secondary | ICD-10-CM

## 2015-07-29 DIAGNOSIS — L03115 Cellulitis of right lower limb: Secondary | ICD-10-CM

## 2015-07-29 DIAGNOSIS — Z853 Personal history of malignant neoplasm of breast: Secondary | ICD-10-CM | POA: Insufficient documentation

## 2015-07-29 DIAGNOSIS — F329 Major depressive disorder, single episode, unspecified: Secondary | ICD-10-CM | POA: Insufficient documentation

## 2015-07-29 DIAGNOSIS — R600 Localized edema: Secondary | ICD-10-CM

## 2015-07-29 DIAGNOSIS — E785 Hyperlipidemia, unspecified: Secondary | ICD-10-CM | POA: Insufficient documentation

## 2015-07-29 DIAGNOSIS — M7989 Other specified soft tissue disorders: Secondary | ICD-10-CM | POA: Insufficient documentation

## 2015-07-29 DIAGNOSIS — I10 Essential (primary) hypertension: Secondary | ICD-10-CM | POA: Insufficient documentation

## 2015-07-29 NOTE — ED Notes (Addendum)
Patient presents for bilateral ankle swelling, itching with rash x2 days. Allergy to grass and noticed swelling after walking through grass. Took benadryl approximately 0000 today. Ambulatory with steady gait.

## 2015-07-30 MED ORDER — FUROSEMIDE 20 MG PO TABS
20.0000 mg | ORAL_TABLET | Freq: Every day | ORAL | Status: DC
Start: 2015-07-30 — End: 2015-08-06

## 2015-07-30 MED ORDER — DOXYCYCLINE HYCLATE 100 MG PO CAPS: 100.0000 mg | ORAL_CAPSULE | Freq: Two times a day (BID) | ORAL | Status: DC

## 2015-07-30 MED ORDER — CEPHALEXIN 500 MG PO CAPS
500.0000 mg | ORAL_CAPSULE | Freq: Once | ORAL | Status: AC
Start: 2015-07-30 — End: 2015-07-30
  Administered 2015-07-30: 500 mg via ORAL
  Filled 2015-07-30: qty 1

## 2015-07-30 MED ORDER — CEPHALEXIN 500 MG PO CAPS: 500.0000 mg | ORAL_CAPSULE | Freq: Two times a day (BID) | ORAL | Status: DC

## 2015-07-30 MED ORDER — DOXYCYCLINE HYCLATE 100 MG PO TABS
100.0000 mg | ORAL_TABLET | Freq: Once | ORAL | Status: AC
  Administered 2015-07-30: 100 mg via ORAL
  Filled 2015-07-30: qty 1

## 2015-07-30 NOTE — Discharge Instructions (Signed)
Cellulitis Deborah Figueroa, take antibiotics for 5 days for the infection in your leg.  Also take lasix for 3 days to help with the swelling, this medication will make you urinate a lot so do not take it before going to sleep. See your primary care physician within 3 days for close follow up. If symptoms worsen, come back to the ED immediately. Thank you.   Cellulitis is an infection of the skin and the tissue under the skin. The infected area is usually red and tender. This happens most often in the arms and lower legs. HOME CARE   Take your antibiotic medicine as told. Finish the medicine even if you start to feel better.  Keep the infected arm or leg raised (elevated).  Put a warm cloth on the area up to 4 times per day.  Only take medicines as told by your doctor.  Keep all doctor visits as told. GET HELP IF:  You see red streaks on the skin coming from the infected area.  Your red area gets bigger or turns a dark color.  Your bone or joint under the infected area is painful after the skin heals.  Your infection comes back in the same area or different area.  You have a puffy (swollen) bump in the infected area.  You have new symptoms.  You have a fever. GET HELP RIGHT AWAY IF:   You feel very sleepy.  You throw up (vomit) or have watery poop (diarrhea).  You feel sick and have muscle aches and pains.   This information is not intended to replace advice given to you by your health care provider. Make sure you discuss any questions you have with your health care provider.   Document Released: 08/17/2007 Document Revised: 11/19/2014 Document Reviewed: 05/16/2011 Elsevier Interactive Patient Education 2016 Elsevier Inc. Edema Edema is an abnormal buildup of fluids. It is more common in your legs and thighs. Painless swelling of the feet and ankles is more likely as a person ages. It also is common in looser skin, like around your eyes. HOME CARE   Keep the affected body  part above the level of the heart while lying down.  Do not sit still or stand for a long time.  Do not put anything right under your knees when you lie down.  Do not wear tight clothes on your upper legs.  Exercise your legs to help the puffiness (swelling) go down.  Wear elastic bandages or support stockings as told by your doctor.  A low-salt diet may help lessen the puffiness.  Only take medicine as told by your doctor. GET HELP IF:  Treatment is not working.  You have heart, liver, or kidney disease and notice that your skin looks puffy or shiny.  You have puffiness in your legs that does not get better when you raise your legs.  You have sudden weight gain for no reason. GET HELP RIGHT AWAY IF:   You have shortness of breath or chest pain.  You cannot breathe when you lie down.  You have pain, redness, or warmth in the areas that are puffy.  You have heart, liver, or kidney disease and get edema all of a sudden.  You have a fever and your symptoms get worse all of a sudden. MAKE SURE YOU:   Understand these instructions.  Will watch your condition.  Will get help right away if you are not doing well or get worse.   This information is not intended  to replace advice given to you by your health care provider. Make sure you discuss any questions you have with your health care provider.   Document Released: 08/17/2007 Document Revised: 03/05/2013 Document Reviewed: 12/21/2012 Elsevier Interactive Patient Education [employer].

## 2015-07-30 NOTE — ED Notes (Addendum)
Pt sts she has been taking benadryl several times and it has been ineffective. Pt wanting to know if she needs steroids to bring the inflamation down.  Told pt we would let the doctor eval first.  Pt sts skin is very tight in lower extremities.

## 2015-07-30 NOTE — ED Notes (Signed)
Bed: WA01 Expected date:  Expected time:  Means of arrival:  Comments: 

## 2015-07-30 NOTE — ED Provider Notes (Signed)
CSN: [id number]     Arrival date & time 07/29/15  2251 History  By signing my name below, I, Evelene Croon, attest that this documentation has been prepared under the direction and in the presence of Everlene Balls, MD . Electronically Signed: Evelene Croon, Scribe. 07/30/2015. 1:34 AM.   Chief Complaint  Patient presents with  . Foot Swelling   The history is provided by the patient. No language interpreter was used.   HPI Comments:  Deborah Figueroa is a 60 y.o. female who presents to the Emergency Department complaining of intermittent swelling to her bilateral feet for a few days. Pt notes she is on her feet for long periods of time. She reports associated itching to her BLE; states she was walking in a field a few days ago. She has been taking benadryl without relief of her itching. No alleviating factors noted.  Past Medical History  Diagnosis Date  . Hypertension   . Hyperlipidemia   . Depression   . Seasonal allergies    Past Surgical History  Procedure Laterality Date  . Lumbar fusion    . Breast surgery      lumpectomy left breast  . Back surgery     Family History  Problem Relation Age of Onset  . Stroke Mother   . Hypertension Mother   . Hyperlipidemia Mother   . Cancer Father 47    liver and colon  . Depression Father   . Cancer Maternal Aunt     breast  . Cancer Maternal Grandmother     breast  . Diabetes Brother    Social History  Substance Use Topics  . Smoking status: Never Smoker   . Smokeless tobacco: None  . Alcohol Use: No   OB History    No data available     Review of Systems  10 systems reviewed and all are negative for acute change except as noted in the HPI.   Allergies  Review of patient's allergies indicates no known allergies.  Home Medications   Prior to Admission medications   Medication Sig Start Date End Date Taking? Authorizing Provider  albuterol (PROAIR HFA) 108 (90 BASE) MCG/ACT inhaler Inhale 2 puffs into the lungs  every 6 (six) hours as needed for wheezing or shortness of breath. 01/10/15   Alferd Apa Lowne Chase, DO  atenolol (TENORMIN) 100 MG tablet TAKE 1 TABLET BY MOUTH DAILY. Patient taking differently: Take 100 mg by mouth daily.  11/21/14   Rosalita Chessman Chase, DO  atorvastatin (LIPITOR) 40 MG tablet Take 1 tablet (40 mg total) by mouth daily. 12/03/14   Rosalita Chessman Chase, DO  carbamide peroxide (DEBROX) 6.5 % otic solution Place 10 drops into the right ear 2 (two) times daily. Patient not taking: Reported on 01/04/2015 07/27/14   Linton Flemings, MD  ciprofloxacin (CIPRO) 500 MG tablet Take 1 tablet (500 mg total) by mouth 2 (two) times daily. Patient not taking: Reported on 01/04/2015 11/21/14   Rosalita Chessman Chase, DO  ibuprofen (ADVIL,MOTRIN) 800 MG tablet Take 1 tablet (800 mg total) by mouth 3 (three) times daily. 01/04/15   Hanna Patel-Mills, PA-C  lisinopril-hydrochlorothiazide (PRINZIDE,ZESTORETIC) 20-12.5 MG tablet Take 2 tablets by mouth daily. Office visit due now 04/30/15   Rosalita Chessman Chase, DO  naproxen sodium (ANAPROX) 220 MG tablet Take 220 mg by mouth 2 (two) times daily as needed (for pain.).    Historical Provider, MD  sertraline (ZOLOFT) 100 MG tablet TAKE 1 TABLET (  100 MG TOTAL) BY MOUTH DAILY. OFFICE VISIT DUE NOW 11/25/14   Alferd Apa Lowne Chase, DO   BP 134/76 mmHg  Pulse 92  Temp(Src) 98.3 F (36.8 C) (Oral)  Resp 20  SpO2 97% Physical Exam  Constitutional: She is oriented to person, place, and time. She appears well-developed and well-nourished. No distress.  HENT:  Head: Normocephalic and atraumatic.  Nose: Nose normal.  Mouth/Throat: Oropharynx is clear and moist. No oropharyngeal exudate.  Eyes: Conjunctivae and EOM are normal. Pupils are equal, round, and reactive to light. No scleral icterus.  Neck: Normal range of motion. Neck supple. No JVD present. No tracheal deviation present. No thyromegaly present.  Cardiovascular: Normal rate, regular rhythm and normal heart  sounds.  Exam reveals no gallop and no friction rub.   No murmur heard. Pulmonary/Chest: Effort normal and breath sounds normal. No respiratory distress. She has no wheezes. She exhibits no tenderness.  Abdominal: Soft. Bowel sounds are normal. She exhibits no distension and no mass. There is no tenderness. There is no rebound and no guarding.  Musculoskeletal: Normal range of motion. She exhibits edema. She exhibits no tenderness.  BLE edema, erythema, and warmth; mid tibia and distally with increased warmth on palpation    Lymphadenopathy:    She has no cervical adenopathy.  Neurological: She is alert and oriented to person, place, and time. No cranial nerve deficit. She exhibits normal muscle tone.  Skin: Skin is warm and dry. There is erythema. No pallor.  Nursing note and vitals reviewed.   ED Course  Procedures   DIAGNOSTIC STUDIES:  Oxygen Saturation is 97% on RA, normal by my interpretation.    COORDINATION OF CARE:  1:29 AM Discussed treatment plan with pt at bedside and pt agreed to plan.   MDM   Final diagnoses:  None    Patient presents to the ED for BLE swelling and redness.  She has edema and possibly a mild cellulitis.  Will give a short 3 days course of lasix and 5 day course of abx to treat both.  PCP fu advised within 3 days.  She appears well and inNAD.  VS remain within her normal limits and she is safe for DC.  I personally performed the services described in this documentation, which was scribed in my presence. The recorded information has been reviewed and is accurate.      Everlene Balls, MD 07/30/15 [phone]

## 2015-08-06 ENCOUNTER — Ambulatory Visit (INDEPENDENT_AMBULATORY_CARE_PROVIDER_SITE_OTHER): Payer: Self-pay | Admitting: Family Medicine

## 2015-08-06 ENCOUNTER — Encounter: Payer: Self-pay | Admitting: Family Medicine

## 2015-08-06 ENCOUNTER — Telehealth: Payer: Self-pay | Admitting: Family Medicine

## 2015-08-06 ENCOUNTER — Encounter: Payer: Self-pay | Admitting: *Deleted

## 2015-08-06 VITALS — BP 142/80 | HR 98 | Temp 98.6°F | Resp 18 | Wt 237.0 lb

## 2015-08-06 DIAGNOSIS — L03115 Cellulitis of right lower limb: Secondary | ICD-10-CM

## 2015-08-06 DIAGNOSIS — L03116 Cellulitis of left lower limb: Secondary | ICD-10-CM

## 2015-08-06 LAB — BASIC METABOLIC PANEL
BUN: 29 mg/dL — ABNORMAL HIGH (ref 7–25)
CO2: 24 mmol/L (ref 20–31)
Calcium: 12 mg/dL — ABNORMAL HIGH (ref 8.6–10.4)
Chloride: 103 mmol/L (ref 98–110)
Creat: 1.16 mg/dL — ABNORMAL HIGH (ref 0.50–0.99)
Glucose, Bld: 183 mg/dL — ABNORMAL HIGH (ref 65–99)
Potassium: 3.5 mmol/L (ref 3.5–5.3)
Sodium: 136 mmol/L (ref 135–146)

## 2015-08-06 MED ORDER — CLINDAMYCIN HCL 300 MG PO CAPS: 300.0000 mg | ORAL_CAPSULE | Freq: Three times a day (TID) | ORAL | Status: DC

## 2015-08-06 MED ORDER — FUROSEMIDE 20 MG PO TABS: 20.0000 mg | ORAL_TABLET | Freq: Every day | ORAL | Status: DC

## 2015-08-06 NOTE — Telephone Encounter (Signed)
Noted  

## 2015-08-06 NOTE — Telephone Encounter (Signed)
Patient Name: ZIPORA VANEPS  DOB: 1955/09/03    Initial Comment Caller states she has Cellulitis, rash and blisters around lower legs at the ankle.Marland KitchenMarland KitchenMarland KitchenCaller has no call back #.   Nurse Assessment  Nurse: Raphael Gibney, RN, Vanita Ingles Date/Time (Eastern Time): 08/06/2015 2:39:13 PM  Confirm and document reason for call. If symptomatic, describe symptoms. You must click the next button to save text entered. ---Caller states she has cellulitis around both ankles. She went to the ER and completed her antibiotics today. Still has rash, blisters half way up her calves. her feet and ankles are swollen. Ankles are stiff from the edema.  Has the patient traveled out of the country within the last 30 days? ---Not Applicable  Does the patient have any new or worsening symptoms? ---Yes  Will a triage be completed? ---Yes  Related visit to physician within the last 2 weeks? ---Yes  Does the PT have any chronic conditions? (i.e. diabetes, asthma, etc.) ---Yes  List chronic conditions. ---HTN  Is this a behavioral health or substance abuse call? ---No     Guidelines    Guideline Title Affirmed Question Affirmed Notes  Cellulitis on Antibiotic Follow-up Call [1] Taking antibiotic > 24 hours AND [2] red streak (or line) runs from area of infection    Final Disposition User   See Physician within 4 Hours (or PCP triage) Raphael Gibney, RN, Vera    Comments  Pt states she is closer to the [place] office and would rather go there. appt scheduled for 4 pm 08/06/15 with Almira Coaster   Referrals  REFERRED TO PCP OFFICE   Disagree/Comply: Leta Baptist

## 2015-08-06 NOTE — Progress Notes (Signed)
Pre visit review using our clinic review tool, if applicable. No additional management support is needed unless otherwise documented below in the visit note. 

## 2015-08-06 NOTE — Patient Instructions (Signed)
Please go to lab for blood work. You will be notified of lab results and adjustments for medications will be made if necessary. Please follow up within 3 days or sooner if symptoms progress or do not improve.   Cellulitis Cellulitis is an infection of the skin and the tissue beneath it. The infected area is usually red and tender. Cellulitis occurs most often in the arms and lower legs.  CAUSES  Cellulitis is caused by bacteria that enter the skin through cracks or cuts in the skin. The most common types of bacteria that cause cellulitis are staphylococci and streptococci. SIGNS AND SYMPTOMS   Redness and warmth.  Swelling.  Tenderness or pain.  Fever. DIAGNOSIS  Your health care provider can usually determine what is wrong based on a physical exam. Blood tests may also be done. TREATMENT  Treatment usually involves taking an antibiotic medicine. HOME CARE INSTRUCTIONS   Take your antibiotic medicine as directed by your health care provider. Finish the antibiotic even if you start to feel better.  Keep the infected arm or leg elevated to reduce swelling.  Apply a warm cloth to the affected area up to 4 times per day to relieve pain.  Take medicines only as directed by your health care provider.  Keep all follow-up visits as directed by your health care provider. SEEK MEDICAL CARE IF:   You notice red streaks coming from the infected area.  Your red area gets larger or turns dark in color.  Your bone or joint underneath the infected area becomes painful after the skin has healed.  Your infection returns in the same area or another area.  You notice a swollen bump in the infected area.  You develop new symptoms.  You have a fever. SEEK IMMEDIATE MEDICAL CARE IF:   You feel very sleepy.  You develop vomiting or diarrhea.  You have a general ill feeling (malaise) with muscle aches and pains.   This information is not intended to replace advice given to you by your  health care provider. Make sure you discuss any questions you have with your health care provider.   Document Released: 12/08/2004 Document Revised: 11/19/2014 Document Reviewed: 05/16/2011 Elsevier Interactive Patient Education [employer].

## 2015-08-06 NOTE — Progress Notes (Addendum)
Subjective:    Patient ID: Deborah Figueroa, female    DOB: [birth year], 60 y.o.   MRN: [id number]  HPI  Deborah Figueroa is a 60 year old female who presents today with swelling in her lower extremities bilaterally.  I am seeing her in place of her PCP who is unavailable at this time. She reports an ED visit on 07/29/2015 where she was treated for cellulitis with a 3 day course of lasix and a 5 day course of cephalexin.  Associated symptom of aching is present and noted as 7-8 at times with "tightness and swelling".  She notes being on her feet for long periods of time and reports walking in tall grass prior to presentation of symptoms. Also, she walked a great distance to clinic after getting off at the wrong bus stop for this clinic.  Additional treatment of benadryl has been used for itching which has provided moderate benefit.  Symptoms are not worse per patient report after ED visit, however they are not resolving with the current antibiotic therapy of cephalexin for 5 days. She denies chest pain, palpitations, SOB, abscess or any lesion or drainage noted. She did not follow up with her PCP 3 days after ED visit for which she was advised.  Review of Systems  Constitutional: Negative for fever and chills.  Eyes: Negative for visual disturbance.  Respiratory: Negative for cough, shortness of breath and wheezing.   Cardiovascular: Positive for leg swelling. Negative for chest pain and palpitations.  Gastrointestinal: Negative for nausea, vomiting, abdominal pain and diarrhea.  Musculoskeletal: Negative for myalgias, arthralgias and gait problem.  Skin: Positive for color change.       Slight Redness BLE   Neurological: Negative for dizziness, light-headedness, numbness and headaches.   Past Medical History  Diagnosis Date  . Hypertension   . Hyperlipidemia   . Depression   . Seasonal allergies      Social History   Social History  . Marital Status: Divorced    Spouse Name: N/A  .  Number of Children: N/A  . Years of Education: N/A   Occupational History  . Not on file.   Social History Main Topics  . Smoking status: Never Smoker   . Smokeless tobacco: Not on file  . Alcohol Use: No  . Drug Use: No  . Sexual Activity: Not on file   Other Topics Concern  . Not on file   Social History Narrative    Past Surgical History  Procedure Laterality Date  . Lumbar fusion    . Breast surgery      lumpectomy left breast  . Back surgery      Family History  Problem Relation Age of Onset  . Stroke Mother   . Hypertension Mother   . Hyperlipidemia Mother   . Cancer Father 69    liver and colon  . Depression Father   . Cancer Maternal Aunt     breast  . Cancer Maternal Grandmother     breast  . Diabetes Brother     No Known Allergies  Current Outpatient Prescriptions on File Prior to Visit  Medication Sig Dispense Refill  . atenolol (TENORMIN) 100 MG tablet TAKE 1 TABLET BY MOUTH DAILY. (Patient taking differently: Take 100 mg by mouth daily. ) 90 tablet 3  . lisinopril-hydrochlorothiazide (PRINZIDE,ZESTORETIC) 20-12.5 MG tablet Take 2 tablets by mouth daily. Office visit due now 60 tablet 0  . naproxen sodium (ANAPROX) 220 MG tablet Take 220 mg by  mouth 2 (two) times daily as needed (for pain.).    Marland Kitchen sertraline (ZOLOFT) 100 MG tablet TAKE 1 TABLET (100 MG TOTAL) BY MOUTH DAILY. OFFICE VISIT DUE NOW 90 tablet 1  . albuterol (PROAIR HFA) 108 (90 BASE) MCG/ACT inhaler Inhale 2 puffs into the lungs every 6 (six) hours as needed for wheezing or shortness of breath. (Patient not taking: Reported on 08/06/2015) 1 Inhaler 3  . atorvastatin (LIPITOR) 40 MG tablet Take 1 tablet (40 mg total) by mouth daily. (Patient not taking: Reported on 08/06/2015) 30 tablet 2  . cephALEXin (KEFLEX) 500 MG capsule Take 1 capsule (500 mg total) by mouth 2 (two) times daily. (Patient not taking: Reported on 08/06/2015) 9 capsule 0  . doxycycline (VIBRAMYCIN) 100 MG capsule Take 1  capsule (100 mg total) by mouth 2 (two) times daily. One po bid x 7 days (Patient not taking: Reported on 08/06/2015) 9 capsule 0  . ibuprofen (ADVIL,MOTRIN) 800 MG tablet Take 1 tablet (800 mg total) by mouth 3 (three) times daily. (Patient not taking: Reported on 08/06/2015) 21 tablet 0   No current facility-administered medications on file prior to visit.    BP 142/80 mmHg  Pulse 113  Temp(Src) 98.6 F (37 C) (Oral)  Resp 18  Wt 237 lb (107.502 kg)  SpO2 98%       Objective:   Physical Exam  Constitutional: She is oriented to person, place, and time. She appears well-developed and well-nourished.  Eyes: Pupils are equal, round, and reactive to light. No scleral icterus.  Cardiovascular: Normal rate and regular rhythm.   Pulmonary/Chest: Effort normal and breath sounds normal. She has no wheezes. She has no rales.  Abdominal: Soft. Bowel sounds are normal. She exhibits no distension. There is no tenderness.  Musculoskeletal: She exhibits edema.  BLE edema, erythema, and warmth; mid tibia and distally with increased warmth on palpation  Lymphadenopathy:    She has no cervical adenopathy.  Neurological: She is alert and oriented to person, place, and time.  Skin: Skin is warm and dry. There is erythema.  Psychiatric: She has a normal mood and affect. Her behavior is normal. Judgment and thought content normal.        Assessment & Plan:  1. Cellulitis of left lower extremity Exam and history support treatment for nonpurulent cellulitis. Cephalexin therapy complete without resolution of symptoms. Initiate clindamycin and furosemide for 3 days with stat BMP to evaluate potassium level. Advised patient that she will be contacted regarding her BMP and if potassium level is WNL, additional furosemide may be provided if needed. Further advised patient to apply warm cloth to legs for pain relieve and seek medical attention immediately if she notices red streaks extending from infected  area or if symptoms worsen such as turning darker or covering a larger area.  Advised patient to follow up with her primary care provider within next 3 business days for evaluation of current regimen or sooner for medical attention if she notices any symptoms discussed previously. Patient voiced understanding and agreed with plan.  - Basic metabolic panel - clindamycin (CLEOCIN) 300 MG capsule; Take 1 capsule (300 mg total) by mouth 3 (three) times daily.  Dispense: 15 capsule; Refill: 0 - furosemide (LASIX) 20 MG tablet; Take 1 tablet (20 mg total) by mouth daily.  Dispense: 3 tablet; Refill: 0  2. Cellulitis of right lower extremity  - Basic metabolic panel - clindamycin (CLEOCIN) 300 MG capsule; Take 1 capsule (300 mg total) by mouth 3 (  three) times daily.  Dispense: 15 capsule; Refill: 0 - furosemide (LASIX) 20 MG tablet; Take 1 tablet (20 mg total) by mouth daily.  Dispense: 3 tablet; Refill: 0

## 2015-08-07 ENCOUNTER — Telehealth: Payer: Self-pay | Admitting: Family Medicine

## 2015-08-07 NOTE — Telephone Encounter (Signed)
Message left for patient to follow up with her PCP on Tuesday.  This was discussed yesterday at her visit.  Also, advised patient to contact clinic if she has further questions or concerns.

## 2015-08-07 NOTE — Telephone Encounter (Signed)
Pt would like to know if you can call her with potassium results. Pt was instructed to follow up today but Almyra Free is out.  Pt states Dr Raliegh Ip came in as well to see her. Can he look at the results?  Pt is at Phone: [phone] Room [place]

## 2015-08-07 NOTE — Telephone Encounter (Signed)
Spoke to pt, told her Potassium level  was normal and glucose was elevated and Gregary Signs left message for her to follow up with PCP on Tuesday. Pt verbalized understanding.

## 2015-08-09 ENCOUNTER — Emergency Department (HOSPITAL_COMMUNITY)
Admission: EM | Admit: 2015-08-09 | Discharge: 2015-08-09 | Disposition: A | Discharge status: Home / Self Care | Payer: Self-pay | Attending: Emergency Medicine | Admitting: Emergency Medicine

## 2015-08-09 ENCOUNTER — Encounter (HOSPITAL_COMMUNITY): Payer: Self-pay | Admitting: Emergency Medicine

## 2015-08-09 DIAGNOSIS — I872 Venous insufficiency (chronic) (peripheral): Secondary | ICD-10-CM

## 2015-08-09 DIAGNOSIS — Z79899 Other long term (current) drug therapy: Secondary | ICD-10-CM | POA: Insufficient documentation

## 2015-08-09 DIAGNOSIS — L03115 Cellulitis of right lower limb: Secondary | ICD-10-CM | POA: Insufficient documentation

## 2015-08-09 DIAGNOSIS — Z792 Long term (current) use of antibiotics: Secondary | ICD-10-CM | POA: Insufficient documentation

## 2015-08-09 DIAGNOSIS — Z791 Long term (current) use of non-steroidal anti-inflammatories (NSAID): Secondary | ICD-10-CM | POA: Insufficient documentation

## 2015-08-09 DIAGNOSIS — I10 Essential (primary) hypertension: Secondary | ICD-10-CM | POA: Insufficient documentation

## 2015-08-09 HISTORY — DX: Cellulitis, unspecified: L03.90

## 2015-08-09 NOTE — Discharge Instructions (Signed)
Please read and follow all provided instructions.  Your diagnoses today include:  1. Venous stasis dermatitis of both lower extremities    Tests performed today include:  Vital signs. See below for your results today.   Medications prescribed:   None  Take any prescribed medications only as directed.   Home care instructions:  Follow any educational materials contained in this packet. Keep affected area above the level of your heart when possible. Wash area gently twice a day with warm soapy water. Do not apply alcohol or hydrogen peroxide. Cover the area if it draining or weeping.   Follow-up instructions: Return to the Emergency Department in 48 hours for a recheck if your symptoms are not significantly improved.   Please follow-up with your primary care provider in the next 3 days for further evaluation of your symptoms.   Return instructions:  Return to the Emergency Department if you have:  Fever  Worsening symptoms  Worsening pain  Worsening swelling  Redness of the skin that moves away from the affected area, especially if it streaks away from the affected area   Any other emergent concerns  Your vital signs today were: BP 153/91 mmHg   Pulse 76   Temp(Src) 98.8 F (37.1 C) (Oral)   Resp 17   Ht 5\' 11"  (1.803 m)   Wt 105.235 kg   BMI 32.37 kg/m2   SpO2 98% If your blood pressure (BP) was elevated above 135/85 this visit, please have this repeated by your doctor within one month. --------------

## 2015-08-09 NOTE — ED Provider Notes (Signed)
CSN: [id number]     Arrival date & time 08/09/15  2102 History   First MD Initiated Contact with Patient 08/09/15 2140     Chief Complaint  Patient presents with  . Cellulitis     (Consider location/radiation/quality/duration/timing/severity/associated sxs/prior Treatment) HPI Comments: Patient presents with one-week history of bilateral lower extremity cellulitis and swelling. Patient was seen in emergency department on 5/17 for the same and was started on Keflex. She followed up with her primary care physician several days later who switched her to clindamycin. Patient is on day 5 of 7 of clindamycin. She was also been placed on Lasix for swelling. She states that she is on her feet a lot at work and she walks long distances. No fevers, vomiting, diarrhea. No history of diabetes. She's taken Benadryl earlier this week without relief. No history of blood clots. No chest pain or shortness of breath. Overall, redness is improving from earlier in the week. She continues to have swelling and occasional burning sensation in her bilateral ankles. Onset is constant. Nothing makes symptoms worse.  The history is provided by medical records and the patient.    Past Medical History  Diagnosis Date  . Hypertension   . Hyperlipidemia   . Depression   . Seasonal allergies   . Cellulitis    Past Surgical History  Procedure Laterality Date  . Lumbar fusion    . Breast surgery      lumpectomy left breast  . Back surgery     Family History  Problem Relation Age of Onset  . Stroke Mother   . Hypertension Mother   . Hyperlipidemia Mother   . Cancer Father 17    liver and colon  . Depression Father   . Cancer Maternal Aunt     breast  . Cancer Maternal Grandmother     breast  . Diabetes Brother    Social History  Substance Use Topics  . Smoking status: Never Smoker   . Smokeless tobacco: None  . Alcohol Use: No   OB History    No data available     Review of Systems   Constitutional: Negative for fever.  HENT: Negative for rhinorrhea and sore throat.   Eyes: Negative for redness.  Respiratory: Negative for cough.   Cardiovascular: Positive for leg swelling. Negative for chest pain.  Gastrointestinal: Negative for nausea, vomiting, abdominal pain and diarrhea.  Genitourinary: Negative for dysuria.  Musculoskeletal: Negative for myalgias.  Skin: Positive for color change. Negative for rash.  Neurological: Negative for headaches.      Allergies  Review of patient's allergies indicates no known allergies.  Home Medications   Prior to Admission medications   Medication Sig Start Date End Date Taking? Authorizing Provider  albuterol (PROAIR HFA) 108 (90 BASE) MCG/ACT inhaler Inhale 2 puffs into the lungs every 6 (six) hours as needed for wheezing or shortness of breath. Patient not taking: Reported on 08/06/2015 01/10/15   Alferd Apa Lowne Chase, DO  atenolol (TENORMIN) 100 MG tablet TAKE 1 TABLET BY MOUTH DAILY. Patient taking differently: Take 100 mg by mouth daily.  11/21/14   Rosalita Chessman Chase, DO  atorvastatin (LIPITOR) 40 MG tablet Take 1 tablet (40 mg total) by mouth daily. Patient not taking: Reported on 08/06/2015 12/03/14   Rosalita Chessman Chase, DO  cephALEXin (KEFLEX) 500 MG capsule Take 1 capsule (500 mg total) by mouth 2 (two) times daily. Patient not taking: Reported on 08/06/2015 07/30/15   Everlene Balls, MD  clindamycin (CLEOCIN) 300 MG capsule Take 1 capsule (300 mg total) by mouth 3 (three) times daily. 08/06/15   Delano Metz, FNP  doxycycline (VIBRAMYCIN) 100 MG capsule Take 1 capsule (100 mg total) by mouth 2 (two) times daily. One po bid x 7 days Patient not taking: Reported on 08/06/2015 07/30/15   Everlene Balls, MD  furosemide (LASIX) 20 MG tablet Take 1 tablet (20 mg total) by mouth daily. 08/06/15   Delano Metz, FNP  ibuprofen (ADVIL,MOTRIN) 800 MG tablet Take 1 tablet (800 mg total) by mouth 3 (three) times daily. Patient not  taking: Reported on 08/06/2015 01/04/15   Ottie Glazier, PA-C  lisinopril-hydrochlorothiazide (PRINZIDE,ZESTORETIC) 20-12.5 MG tablet Take 2 tablets by mouth daily. Office visit due now 04/30/15   Rosalita Chessman Chase, DO  naproxen sodium (ANAPROX) 220 MG tablet Take 220 mg by mouth 2 (two) times daily as needed (for pain.).    Historical Provider, MD  sertraline (ZOLOFT) 100 MG tablet TAKE 1 TABLET (100 MG TOTAL) BY MOUTH DAILY. OFFICE VISIT DUE NOW 11/25/14   Alferd Apa Lowne Chase, DO   BP 153/91 mmHg  Pulse 76  Temp(Src) 98.8 F (37.1 C) (Oral)  Resp 17  Ht 5\' 11"  (1.803 m)  Wt 105.235 kg  BMI 32.37 kg/m2  SpO2 98%   Physical Exam  Constitutional: She appears well-developed and well-nourished.  HENT:  Head: Normocephalic and atraumatic.  Eyes: Conjunctivae are normal.  Neck: Normal range of motion. Neck supple.  Pulmonary/Chest: No respiratory distress.  Musculoskeletal: She exhibits edema and tenderness.  Light erythema from the feet up to the mid ankles. 1+ edema bilaterally. No lymphangitic streaking. Mild warmth.  Neurological: She is alert.  Skin: Skin is warm and dry.  There is a 1 cm bullae noted to the lateral aspect of the left heel with clear fluid  Psychiatric: She has a normal mood and affect.  Nursing note and vitals reviewed.   ED Course  Procedures (including critical care time)   11:14 PM Patient seen and examined.  Patient has remaining clindamycin.  Vital signs reviewed and are as follows: BP 153/91 mmHg  Pulse 76  Temp(Src) 98.8 F (37.1 C) (Oral)  Resp 17  Ht 5\' 11"  (1.803 m)  Wt 105.235 kg  BMI 32.37 kg/m2  SpO2 98%  I used an 18-gauge needle to aspirate the blister which was bothersome to the patient. This was then bandaged.  Pt urged to return with worsening pain, worsening swelling, expanding area of redness or streaking up extremity, fever, or any other concerns. Urged to take complete course of antibiotics as prescribed. Encourage patient  to follow-up with her doctor this week for recheck. Pt verbalizes understanding and agrees with plan.  Also encourage patient to buy a pair of compression stockings to help with venous stasis.   MDM   Final diagnoses:  Venous stasis dermatitis of both lower extremities   Patient with venous stasis changes of her lower extremities. Likely venous stasis dermatitis associated. Will have patient finish her course of clindamycin. Overall, her symptoms seem to be improving. No indications of systemic infection or need for admission at this time.  No dangerous or life-threatening conditions suspected or identified by history, physical exam, and by work-up. No indications for hospitalization identified.      Carlisle Cater, PA-C 08/09/15 [place], MD 08/21/15 [phone]

## 2015-08-09 NOTE — ED Notes (Signed)
Pt states being seen her about a week ago for cellulitis in both lower extremities.  Patient stated she went to her primary care doctor and was placed on additional antibiotics since that visit.  Pt was at [employer] today and said her legs began burning.  Medium size blister noted to right posterior ankle.

## 2015-08-10 ENCOUNTER — Emergency Department (HOSPITAL_COMMUNITY): Admission: EM | Admit: 2015-08-10 | Discharge: 2015-08-10 | Discharge status: Absent without leave | Payer: Self-pay

## 2015-08-27 ENCOUNTER — Emergency Department (HOSPITAL_COMMUNITY): Payer: Self-pay

## 2015-08-27 ENCOUNTER — Encounter (HOSPITAL_COMMUNITY): Payer: Self-pay

## 2015-08-27 ENCOUNTER — Emergency Department (HOSPITAL_COMMUNITY)
Admission: EM | Admit: 2015-08-27 | Discharge: 2015-08-27 | Disposition: A | Discharge status: Home / Self Care | Payer: Self-pay | Attending: Emergency Medicine | Admitting: Emergency Medicine

## 2015-08-27 DIAGNOSIS — E785 Hyperlipidemia, unspecified: Secondary | ICD-10-CM | POA: Insufficient documentation

## 2015-08-27 DIAGNOSIS — R600 Localized edema: Secondary | ICD-10-CM | POA: Insufficient documentation

## 2015-08-27 DIAGNOSIS — F329 Major depressive disorder, single episode, unspecified: Secondary | ICD-10-CM | POA: Insufficient documentation

## 2015-08-27 DIAGNOSIS — R609 Edema, unspecified: Secondary | ICD-10-CM

## 2015-08-27 DIAGNOSIS — Z79899 Other long term (current) drug therapy: Secondary | ICD-10-CM | POA: Insufficient documentation

## 2015-08-27 DIAGNOSIS — I10 Essential (primary) hypertension: Secondary | ICD-10-CM | POA: Insufficient documentation

## 2015-08-27 DIAGNOSIS — N289 Disorder of kidney and ureter, unspecified: Secondary | ICD-10-CM | POA: Insufficient documentation

## 2015-08-27 LAB — URINALYSIS, ROUTINE W REFLEX MICROSCOPIC
Bilirubin Urine: NEGATIVE
Glucose, UA: NEGATIVE mg/dL
Hgb urine dipstick: NEGATIVE
Ketones, ur: NEGATIVE mg/dL
Nitrite: NEGATIVE
Protein, ur: NEGATIVE mg/dL
Specific Gravity, Urine: 1.006 (ref 1.005–1.030)
pH: 6 (ref 5.0–8.0)

## 2015-08-27 LAB — COMPREHENSIVE METABOLIC PANEL
ALT: 30 U/L (ref 14–54)
AST: 23 U/L (ref 15–41)
Albumin: 4.1 g/dL (ref 3.5–5.0)
Alkaline Phosphatase: 114 U/L (ref 38–126)
Anion gap: 6 (ref 5–15)
BUN: 26 mg/dL — ABNORMAL HIGH (ref 6–20)
CO2: 24 mmol/L (ref 22–32)
Calcium: 12.7 mg/dL — ABNORMAL HIGH (ref 8.9–10.3)
Chloride: 104 mmol/L (ref 101–111)
Creatinine, Ser: 1.26 mg/dL — ABNORMAL HIGH (ref 0.44–1.00)
GFR calc Af Amer: 53 mL/min — ABNORMAL LOW (ref >60)
GFR calc non Af Amer: 45 mL/min — ABNORMAL LOW (ref >60)
Glucose, Bld: 140 mg/dL — ABNORMAL HIGH (ref 65–99)
Potassium: 3.8 mmol/L (ref 3.5–5.1)
Sodium: 134 mmol/L — ABNORMAL LOW (ref 135–145)
Total Bilirubin: 0.8 mg/dL (ref 0.3–1.2)
Total Protein: 7.4 g/dL (ref 6.5–8.1)

## 2015-08-27 LAB — CBC WITH DIFFERENTIAL/PLATELET
Basophils Absolute: 0 10*3/uL (ref 0.0–0.1)
Basophils Relative: 0 %
Eosinophils Absolute: 0.2 10*3/uL (ref 0.0–0.7)
Eosinophils Relative: 4 %
HCT: 35 % — ABNORMAL LOW (ref 36.0–46.0)
Hemoglobin: 11.7 g/dL — ABNORMAL LOW (ref 12.0–15.0)
Lymphocytes Relative: 24 %
Lymphs Abs: 1.2 10*3/uL (ref 0.7–4.0)
MCH: 28.8 pg (ref 26.0–34.0)
MCHC: 33.4 g/dL (ref 30.0–36.0)
MCV: 86.2 fL (ref 78.0–100.0)
Monocytes Absolute: 0.4 10*3/uL (ref 0.1–1.0)
Monocytes Relative: 7 %
Neutro Abs: 3.2 10*3/uL (ref 1.7–7.7)
Neutrophils Relative %: 65 %
Platelets: 191 10*3/uL (ref 150–400)
RBC: 4.06 MIL/uL (ref 3.87–5.11)
RDW: 13.9 % (ref 11.5–15.5)
WBC: 5 10*3/uL (ref 4.0–10.5)

## 2015-08-27 LAB — URINE MICROSCOPIC-ADD ON

## 2015-08-27 LAB — SEDIMENTATION RATE: Sed Rate: 40 mm/hr — ABNORMAL HIGH (ref 0–22)

## 2015-08-27 MED ORDER — FUROSEMIDE 40 MG PO TABS: 40.0000 mg | ORAL_TABLET | Freq: Every day | ORAL | Status: DC

## 2015-08-27 NOTE — ED Notes (Signed)
MD at bedside. 

## 2015-08-27 NOTE — ED Provider Notes (Signed)
CSN: [id number]     Arrival date & time 08/27/15  0026 History   First MD Initiated Contact with Patient 08/27/15 [phone]     Chief Complaint  Patient presents with  . Leg Swelling     (Consider location/radiation/quality/duration/timing/severity/associated sxs/prior Treatment) The history is provided by the patient.  60 year old female comes in because of concern for recurrent cellulitis. She was treated for cellulitis of her legs. This is associated with swelling of her legs. She completed her antibiotics about 10 days ago. She is inconsistent as to whether symptoms had cleared up completely or just improved. However, since discontinuing the antibiotics, she has had increased swelling in her legs and is now developed some erythema. Her legs feel tight and have some pruritus. She denies fever, chills, sweats. She denies dyspnea.  Past Medical History  Diagnosis Date  . Hypertension   . Hyperlipidemia   . Depression   . Seasonal allergies   . Cellulitis    Past Surgical History  Procedure Laterality Date  . Lumbar fusion    . Breast surgery      lumpectomy left breast  . Back surgery     Family History  Problem Relation Age of Onset  . Stroke Mother   . Hypertension Mother   . Hyperlipidemia Mother   . Cancer Father 78    liver and colon  . Depression Father   . Cancer Maternal Aunt     breast  . Cancer Maternal Grandmother     breast  . Diabetes Brother    Social History  Substance Use Topics  . Smoking status: Never Smoker   . Smokeless tobacco: None  . Alcohol Use: No   OB History    No data available     Review of Systems  All other systems reviewed and are negative.     Allergies  Review of patient's allergies indicates no known allergies.  Home Medications   Prior to Admission medications   Medication Sig Start Date End Date Taking? Authorizing Provider  atenolol (TENORMIN) 100 MG tablet TAKE 1 TABLET BY MOUTH DAILY. Patient taking differently:  Take 100 mg by mouth daily.  11/21/14  Yes Yvonne R Lowne Chase, DO  atorvastatin (LIPITOR) 40 MG tablet Take 1 tablet (40 mg total) by mouth daily. 12/03/14  Yes Yvonne R Lowne Chase, DO  lisinopril-hydrochlorothiazide (PRINZIDE,ZESTORETIC) 20-12.5 MG tablet Take 2 tablets by mouth daily. Office visit due now 04/30/15  Yes Rosalita Chessman Chase, DO  naproxen sodium (ANAPROX) 220 MG tablet Take 220 mg by mouth 2 (two) times daily as needed (for pain.).   Yes Historical Provider, MD  sertraline (ZOLOFT) 100 MG tablet TAKE 1 TABLET (100 MG TOTAL) BY MOUTH DAILY. OFFICE VISIT DUE NOW 11/25/14  Yes Alferd Apa Lowne Chase, DO  albuterol (PROAIR HFA) 108 (90 BASE) MCG/ACT inhaler Inhale 2 puffs into the lungs every 6 (six) hours as needed for wheezing or shortness of breath. Patient not taking: Reported on 08/06/2015 01/10/15   Rosalita Chessman Chase, DO  cephALEXin (KEFLEX) 500 MG capsule Take 1 capsule (500 mg total) by mouth 2 (two) times daily. Patient not taking: Reported on 08/06/2015 07/30/15   Everlene Balls, MD  clindamycin (CLEOCIN) 300 MG capsule Take 1 capsule (300 mg total) by mouth 3 (three) times daily. Patient not taking: Reported on 08/27/2015 08/06/15   Delano Metz, FNP  doxycycline (VIBRAMYCIN) 100 MG capsule Take 1 capsule (100 mg total) by mouth 2 (two) times daily. One po  bid x 7 days Patient not taking: Reported on 08/06/2015 07/30/15   Everlene Balls, MD  furosemide (LASIX) 20 MG tablet Take 1 tablet (20 mg total) by mouth daily. Patient not taking: Reported on 08/27/2015 08/06/15   Delano Metz, FNP  ibuprofen (ADVIL,MOTRIN) 800 MG tablet Take 1 tablet (800 mg total) by mouth 3 (three) times daily. Patient not taking: Reported on 08/06/2015 01/04/15   Hanna Patel-Mills, PA-C   BP 135/84 mmHg  Pulse 70  Temp(Src) 97.8 F (36.6 C) (Oral)  Resp 20  SpO2 99% Physical Exam  Nursing note and vitals reviewed.  60 year old female, resting comfortably and in no acute distress. Vital signs are  normal. Oxygen saturation is 99%, which is normal. Head is normocephalic and atraumatic. PERRLA, EOMI. Oropharynx is clear. Neck is nontender and supple without adenopathy or JVD. Back is nontender and there is no CVA tenderness. Lungs are clear without rales, wheezes, or rhonchi. Chest is nontender. Heart has regular rate and rhythm without murmur. Abdomen is soft, flat, nontender without masses or hepatosplenomegaly and peristalsis is normoactive. Extremities have 2+ pitting edema. There is mild erythema overlying the edematous area but no warmth. Picture is more consistent with venous stasis than cellulitis. There is also 1+ presacral edema. Skin is warm and dry without rash. Neurologic: Mental status is normal, cranial nerves are intact, there are no motor or sensory deficits.  ED Course  Procedures (including critical care time) Labs Review Results for orders placed or performed during the hospital encounter of 08/27/15  CBC with Differential  Result Value Ref Range   WBC 5.0 4.0 - 10.5 K/uL   RBC 4.06 3.87 - 5.11 MIL/uL   Hemoglobin 11.7 (L) 12.0 - 15.0 g/dL   HCT 35.0 (L) 36.0 - 46.0 %   MCV 86.2 78.0 - 100.0 fL   MCH 28.8 26.0 - 34.0 pg   MCHC 33.4 30.0 - 36.0 g/dL   RDW 13.9 11.5 - 15.5 %   Platelets 191 150 - 400 K/uL   Neutrophils Relative % 65 %   Neutro Abs 3.2 1.7 - 7.7 K/uL   Lymphocytes Relative 24 %   Lymphs Abs 1.2 0.7 - 4.0 K/uL   Monocytes Relative 7 %   Monocytes Absolute 0.4 0.1 - 1.0 K/uL   Eosinophils Relative 4 %   Eosinophils Absolute 0.2 0.0 - 0.7 K/uL   Basophils Relative 0 %   Basophils Absolute 0.0 0.0 - 0.1 K/uL  Comprehensive metabolic panel  Result Value Ref Range   Sodium 134 (L) 135 - 145 mmol/L   Potassium 3.8 3.5 - 5.1 mmol/L   Chloride 104 101 - 111 mmol/L   CO2 24 22 - 32 mmol/L   Glucose, Bld 140 (H) 65 - 99 mg/dL   BUN 26 (H) 6 - 20 mg/dL   Creatinine, Ser 1.26 (H) 0.44 - 1.00 mg/dL   Calcium 12.7 (H) 8.9 - 10.3 mg/dL   Total  Protein 7.4 6.5 - 8.1 g/dL   Albumin 4.1 3.5 - 5.0 g/dL   AST 23 15 - 41 U/L   ALT 30 14 - 54 U/L   Alkaline Phosphatase 114 38 - 126 U/L   Total Bilirubin 0.8 0.3 - 1.2 mg/dL   GFR calc non Af Amer 45 (L) >60 mL/min   GFR calc Af Amer 53 (L) >60 mL/min   Anion gap 6 5 - 15  Urinalysis, Routine w reflex microscopic  Result Value Ref Range   Color, Urine YELLOW YELLOW  APPearance CLEAR CLEAR   Specific Gravity, Urine 1.006 1.005 - 1.030   pH 6.0 5.0 - 8.0   Glucose, UA NEGATIVE NEGATIVE mg/dL   Hgb urine dipstick NEGATIVE NEGATIVE   Bilirubin Urine NEGATIVE NEGATIVE   Ketones, ur NEGATIVE NEGATIVE mg/dL   Protein, ur NEGATIVE NEGATIVE mg/dL   Nitrite NEGATIVE NEGATIVE   Leukocytes, UA MODERATE (A) NEGATIVE  Sedimentation rate  Result Value Ref Range   Sed Rate 40 (H) 0 - 22 mm/hr  Urine microscopic-add on  Result Value Ref Range   Squamous Epithelial / LPF 0-5 (A) NONE SEEN   WBC, UA 6-30 0 - 5 WBC/hpf   RBC / HPF 0-5 0 - 5 RBC/hpf   Bacteria, UA RARE (A) NONE SEEN    Imaging Review Dg Chest 2 View  08/27/2015  CLINICAL DATA:  Bilateral foot and ankle swelling. Recent cellulitis. Some cough. Nonsmoker. EXAM: CHEST  2 VIEW COMPARISON:  10/27/2011 FINDINGS: Minimal linear atelectasis in the lung bases. Normal heart size and pulmonary vascularity. No focal airspace disease or consolidation in the lungs. No blunting of costophrenic angles. No pneumothorax. Mediastinal contours appear intact. Mild degenerative changes in the spine. IMPRESSION: Slight linear atelectasis in the lung bases. No evidence of active pulmonary disease. Electronically Signed   By: Lucienne Capers M.D.   On: 08/27/2015 05:14   I have personally reviewed and evaluated these images and lab results as part of my medical decision-making.   MDM   Final diagnoses:  Peripheral edema  Renal insufficiency  Hypercalcemia    Peripheral edema with venous stasis. Doubt recurrent cellulitis. Old records are  reviewed and she was treated with a course of doxycycline and cephalexin of follow by a course of clindamycin. On 2 occasions, she was given a three-day course of furosemide. Will check screening labs and chest x-ray. I suspect that she will need to be on furosemide on an ongoing basis.  Laboratory workup shows blood sugar in the range of prediabetes, mild renal insufficiency, and mild to moderate hypercalcemia. Hypercalcemia have been present on prior metabolic panels as well. This will need to be worked up as an outpatient area furosemide should help to lower the calcium level. She is discharged with prescription for furosemide 40 mg once a day and is to follow-up with her PCP in 5 days.  Delora Fuel, MD [id number]

## 2015-08-27 NOTE — ED Notes (Signed)
Patient c/o bilateral ankle swelling, itching, and burning and skin tightness.  Patient states that has a history of cellulitis.  Patient states that was last treated for cellulitis last month and patient states that she think it never went away.  Patient states pain 8/10.

## 2015-08-27 NOTE — Discharge Instructions (Signed)
Your blood work showed a calcium level which was somewhat high-12.7. Normal is up to 10.5. Dr. Etter Sjogren will need to run some tests to see what is causing that. The furosemide that she were prescribed should help to lower the calcium.  Edema Edema is an abnormal buildup of fluids in your bodytissues. Edema is somewhatdependent on gravity to pull the fluid to the lowest place in your body. That makes the condition more common in the legs and thighs (lower extremities). Painless swelling of the feet and ankles is common and becomes more likely as you get older. It is also common in looser tissues, like around your eyes.  When the affected area is squeezed, the fluid may move out of that spot and leave a dent for a few moments. This dent is called pitting.  CAUSES  There are many possible causes of edema. Eating too much salt and being on your feet or sitting for a long time can cause edema in your legs and ankles. Hot weather may make edema worse. Common medical causes of edema include:  Heart failure.  Liver disease.  Kidney disease.  Weak blood vessels in your legs.  Cancer.  An injury.  Pregnancy.  Some medications.  Obesity. SYMPTOMS  Edema is usually painless.Your skin may look swollen or shiny.  DIAGNOSIS  Your health care provider may be able to diagnose edema by asking about your medical history and doing a physical exam. You may need to have tests such as X-rays, an electrocardiogram, or blood tests to check for medical conditions that may cause edema.  TREATMENT  Edema treatment depends on the cause. If you have heart, liver, or kidney disease, you need the treatment appropriate for these conditions. General treatment may include:  Elevation of the affected body part above the level of your heart.  Compression of the affected body part. Pressure from elastic bandages or support stockings squeezes the tissues and forces fluid back into the blood vessels. This keeps fluid  from entering the tissues.  Restriction of fluid and salt intake.  Use of a water pill (diuretic). These medications are appropriate only for some types of edema. They pull fluid out of your body and make you urinate more often. This gets rid of fluid and reduces swelling, but diuretics can have side effects. Only use diuretics as directed by your health care provider. HOME CARE INSTRUCTIONS   Keep the affected body part above the level of your heart when you are lying down.   Do not sit still or stand for prolonged periods.   Do not put anything directly under your knees when lying down.  Do not wear constricting clothing or garters on your upper legs.   Exercise your legs to work the fluid back into your blood vessels. This may help the swelling go down.   Wear elastic bandages or support stockings to reduce ankle swelling as directed by your health care provider.   Eat a low-salt diet to reduce fluid if your health care provider recommends it.   Only take medicines as directed by your health care provider. SEEK MEDICAL CARE IF:   Your edema is not responding to treatment.  You have heart, liver, or kidney disease and notice symptoms of edema.  You have edema in your legs that does not improve after elevating them.   You have sudden and unexplained weight gain. SEEK IMMEDIATE MEDICAL CARE IF:   You develop shortness of breath or chest pain.   You cannot  breathe when you lie down.  You develop pain, redness, or warmth in the swollen areas.   You have heart, liver, or kidney disease and suddenly get edema.  You have a fever and your symptoms suddenly get worse. MAKE SURE YOU:   Understand these instructions.  Will watch your condition.  Will get help right away if you are not doing well or get worse.   This information is not intended to replace advice given to you by your health care provider. Make sure you discuss any questions you have with your health  care provider.   Document Released: 02/28/2005 Document Revised: 03/21/2014 Document Reviewed: 12/21/2012 Elsevier Interactive Patient Education 2016 [employer]  Hypercalcemia Hypercalcemia is having too much calcium in the blood. The body needs calcium to make bones and keep them strong. Calcium also helps the muscles, nerves, brain, and heart work the way they should. Most of the calcium in the body is in the bones. There is also some calcium in the blood. Hypercalcemia can happen when calcium comes out of the bones, or when the kidneys are not able to remove calcium from the blood. Hypercalcemia can be mild or severe. CAUSES There are many possible causes of hypercalcemia. Common causes include:  Hyperparathyroidism. This is a condition in which the body produces too much parathyroid hormone. There are four parathyroid glands in your neck. These glands produce a chemical messenger (hormone) that helps the body absorb calcium from foods and helps your bones release calcium.  Certain kinds of cancer, such as lung cancer, breast cancer, or myeloma. Less common causes of hypercalcemia include:   Getting too much calcium or vitamin D from your diet.  Kidney failure.  Hyperthyroidism.  Being on bed rest for a long time.  Certain medicines.  Infections.  Sarcoidosis. RISK FACTORS This condition is more likely to develop in:  Women.  People who are 60 years or older.  People who have a family history of hypercalcemia. SYMPTOMS  Mild hypercalcemia that starts slowly may not cause symptoms. Severe, sudden hypercalcemia is more likely to cause symptoms, such as:  Loss of appetite.  Increased thirst and frequent urination.  Fatigue.  Nausea and vomiting.  Headache.  Abdominal pain.  Muscle pain, twitching, or weakness.  Constipation.  Blood in the urine.  Pain in the side of the back (flank pain).  Anxiety, confusion, or depression.  Irregular heartbeat  (arrhythmia).  Loss of consciousness. DIAGNOSIS  This condition may be diagnosed based on:   Your symptoms.  Blood tests.  Urine tests.  X-rays.  Ultrasound.  MRI.  CT scan. TREATMENT  Treatment for hypercalcemia depends on the cause. Treatment may include:  Receiving fluids through an IV tube.  Medicines that keep calcium levels steady after receiving fluids (loop diuretics).  Medicines that keep calcium in your bones (bisphosphonates).  Medicines that lower the calcium level in your blood.  Surgery to remove overactive parathyroid glands. HOME CARE INSTRUCTIONS  Take over-the-counter and prescription medicines only as told by your health care provider.  Follow instructions from your health care provider about eating or drinking restrictions.  Drink enough fluid to keep your urine clear or pale yellow.  Stay active. Weight-bearing exercise helps to keep calcium in your bones. Follow instructions from your health care provider about what type and level of exercise is safe for you.  Keep all follow-up visits as told by your health care provider. This is important. SEEK MEDICAL CARE IF:  You have a fever.  You have flank or abdominal pain that is getting worse. SEEK IMMEDIATE MEDICAL CARE IF:   You have severe abdominal or flank pain.  You have chest pain.  You have trouble breathing.  You become very confused and sleepy.  You lose consciousness.   This information is not intended to replace advice given to you by your health care provider. Make sure you discuss any questions you have with your health care provider.   Document Released: 05/14/2004 Document Revised: 11/19/2014 Document Reviewed: 07/16/2014 Elsevier Interactive Patient Education 2016 Elsevier Inc.  Furosemide tablets What is this medicine? FUROSEMIDE (fyoor [place] se mide) is a diuretic. It helps you make more urine and to lose salt and excess water from your body. This medicine is used to  treat high blood pressure, and edema or swelling from heart, kidney, or liver disease. This medicine may be used for other purposes; ask your health care provider or pharmacist if you have questions. What should I tell my health care provider before I take this medicine? They need to know if you have any of these conditions: -abnormal blood electrolytes -diarrhea or vomiting -gout -heart disease -kidney disease, small amounts of urine, or difficulty passing urine -liver disease -thyroid disease -an unusual or allergic reaction to furosemide, sulfa drugs, other medicines, foods, dyes, or preservatives -pregnant or trying to get pregnant -breast-feeding How should I use this medicine? Take this medicine by mouth with a glass of water. Follow the directions on the prescription label. You may take this medicine with or without food. If it upsets your stomach, take it with food or milk. Do not take your medicine more often than directed. Remember that you will need to pass more urine after taking this medicine. Do not take your medicine at a time of day that will cause you problems. Do not take at bedtime. Talk to your pediatrician regarding the use of this medicine in children. While this drug may be prescribed for selected conditions, precautions do apply. Overdosage: If you think you have taken too much of this medicine contact a poison control center or emergency room at once. NOTE: This medicine is only for you. Do not share this medicine with others. What if I miss a dose? If you miss a dose, take it as soon as you can. If it is almost time for your next dose, take only that dose. Do not take double or extra doses. What may interact with this medicine? -aspirin and aspirin-like medicines -certain antibiotics -chloral hydrate -cisplatin -cyclosporine -digoxin -diuretics -laxatives -lithium -medicines for blood pressure -medicines that relax muscles for surgery -methotrexate -NSAIDs,  medicines for pain and inflammation like ibuprofen, naproxen, or indomethacin -phenytoin -steroid medicines like prednisone or cortisone -sucralfate -thyroid hormones This list may not describe all possible interactions. Give your health care provider a list of all the medicines, herbs, non-prescription drugs, or dietary supplements you use. Also tell them if you smoke, drink alcohol, or use illegal drugs. Some items may interact with your medicine. What should I watch for while using this medicine? Visit your doctor or health care professional for regular checks on your progress. Check your blood pressure regularly. Ask your doctor or health care professional what your blood pressure should be, and when you should contact him or her. If you are a diabetic, check your blood sugar as directed. You may need to be on a special diet while taking this medicine. Check with your doctor. Also, ask how many glasses of fluid you need  to drink a day. You must not get dehydrated. You may get drowsy or dizzy. Do not drive, use machinery, or do anything that needs mental alertness until you know how this drug affects you. Do not stand or sit up quickly, especially if you are an older patient. This reduces the risk of dizzy or fainting spells. Alcohol can make you more drowsy and dizzy. Avoid alcoholic drinks. This medicine can make you more sensitive to the sun. Keep out of the sun. If you cannot avoid being in the sun, wear protective clothing and use sunscreen. Do not use sun lamps or tanning beds/booths. What side effects may I notice from receiving this medicine? Side effects that you should report to your doctor or health care professional as soon as possible: -blood in urine or stools -dry mouth -fever or chills -hearing loss or ringing in the ears -irregular heartbeat -muscle pain or weakness, cramps -skin rash -stomach upset, pain, or nausea -tingling or numbness in the hands or feet -unusually weak  or tired -vomiting or diarrhea -yellowing of the eyes or skin Side effects that usually do not require medical attention (report to your doctor or health care professional if they continue or are bothersome): -headache -loss of appetite -unusual bleeding or bruising This list may not describe all possible side effects. Call your doctor for medical advice about side effects. You may report side effects to FDA at 1-800-FDA-1088. Where should I keep my medicine? Keep out of the reach of children. Store at room temperature between 15 and 30 degrees C (59 and 86 degrees F). Protect from light. Throw away any unused medicine after the expiration date. NOTE: This sheet is a summary. It may not cover all possible information. If you have questions about this medicine, talk to your doctor, pharmacist, or health care provider.    2016, Elsevier/Gold Standard. (2014-05-21 13:49:50)

## 2015-09-07 ENCOUNTER — Other Ambulatory Visit: Payer: Self-pay | Admitting: Internal Medicine

## 2015-11-01 ENCOUNTER — Other Ambulatory Visit: Payer: Self-pay | Admitting: Family Medicine

## 2015-11-01 DIAGNOSIS — I10 Essential (primary) hypertension: Secondary | ICD-10-CM

## 2015-11-23 ENCOUNTER — Other Ambulatory Visit: Payer: Self-pay | Admitting: Family Medicine

## 2015-11-23 DIAGNOSIS — I10 Essential (primary) hypertension: Secondary | ICD-10-CM

## 2015-11-24 NOTE — Telephone Encounter (Signed)
Please schedule pt for annual fasting exam. Please advise pt Rx will not be filled if there is no visit with labwork. Pt last seen 11/18/14. Thank you----PC

## 2015-12-01 NOTE — Telephone Encounter (Signed)
Left message on voicemail for patient to call and schedule fasting appointment

## 2015-12-11 ENCOUNTER — Other Ambulatory Visit: Payer: Self-pay | Admitting: Family Medicine

## 2015-12-11 DIAGNOSIS — I10 Essential (primary) hypertension: Secondary | ICD-10-CM

## 2015-12-14 NOTE — Telephone Encounter (Signed)
lvm advising patient to schedule appointment °

## 2015-12-14 NOTE — Telephone Encounter (Signed)
Please offer this patient an appointment.    Deborah Figueroa

## 2015-12-31 ENCOUNTER — Telehealth: Payer: Self-pay | Admitting: Family Medicine

## 2015-12-31 ENCOUNTER — Other Ambulatory Visit: Payer: Self-pay | Admitting: Family Medicine

## 2015-12-31 DIAGNOSIS — I10 Essential (primary) hypertension: Secondary | ICD-10-CM

## 2015-12-31 MED ORDER — ATENOLOL 100 MG PO TABS: 100.0000 mg | ORAL_TABLET | Freq: Every day | ORAL | 0 refills | Status: DC

## 2015-12-31 MED ORDER — LISINOPRIL-HYDROCHLOROTHIAZIDE 20-12.5 MG PO TABS: 2.0000 | ORAL_TABLET | Freq: Every day | ORAL | 0 refills | Status: DC

## 2015-12-31 NOTE — Addendum Note (Signed)
Addended by: Lamar Blinks C on: 12/31/2015 07:07 PM   Modules accepted: Orders

## 2015-12-31 NOTE — Telephone Encounter (Signed)
Caller name: Relationship to patient: Self Can be reached: [phone]  Pharmacy: [facility] [address], [place] [address] [phone] (Phone) [phone] (Fax     Reason for call: Refill lisinopril-hydrochlorothiazide (PRINZIDE,ZESTORETIC) 20-12.5 MG tablet [[id number]]

## 2015-12-31 NOTE — Telephone Encounter (Signed)
Pharmacy called and states that patient also needs atenolol (TENORMIN) 100 MG tablet [[id number]

## 2015-12-31 NOTE — Telephone Encounter (Signed)
[facility] Day - Client TELEPHONE ADVICE RECORD [facility] Patient Name: Deborah Figueroa DOB: [birth year] Initial Comment Caller states her legs get really tight without her lysenopril blood pressure medication. She needs a refill for it and her doctor is out of town. Her ankles are swollen. She will see the doctor when she gets back in town but needs the refill now - she has been out for two days already. Nurse Assessment Nurse: Germain Osgood, RN, Opal Sidles Date/Time Deborah Figueroa Time): 12/31/2015 4:27:05 PM Confirm and document reason for call. If symptomatic, describe symptoms. You must click the next button to save text entered. ---Caller states her legs get really tight without her Lisinopril and Atenolol blood pressure medications. She needs a refill for it and her doctor is out of town. Her ankles are swollen. She will see the doctor when she gets back in town but needs the refill now - she has been out for two days already. States she spoke with office earlier today and was advised med would be called in. Pharmacy does not have med order. Does not know med doses. Has swelling in ankle and calves. HX cellulitis. Has the patient traveled out of the country within the last 30 days? ---No Does the patient have any new or worsening symptoms? ---Yes Will a triage be completed? ---Yes Related visit to physician within the last 2 weeks? ---No Does the PT have any chronic conditions? (i.e. diabetes, asthma, etc.) ---Yes List chronic conditions. ---August 2017 Cellulitis Is this a behavioral health or substance abuse call? ---No Guidelines Guideline Title Affirmed Question Affirmed Notes Leg Swelling and Edema [1] MODERATE leg swelling (e.g., swelling extends up to knees) AND [2] new onset or worsening Final Disposition User See Physician within 24 Hours Germain Osgood, RN, Opal Sidles Comments Called back line to inform of patient's med needs and refusal to make  appointment, States patient has not been seen in over a year. recommend caller check with pharmacy for emergency supply and call and make appointment with her PCP when returns. Caller informed of office instructions and states she will call them back. Referrals GO TO FACILITY REFUSED Disagree/Comply: Comply

## 2015-12-31 NOTE — Telephone Encounter (Signed)
Patient called back after speaking with [facility] and was upset because she had been transferred to someone who knew nothing about her. States she was not going to Urgent care because she has no insurance at the present time. States she was not going to call the pharmacy as suggested by [facility] because she did not want to be sitting waiting. Offered patient an appointment tomorrow to see a provider about the tightness in her legs and patient declined. Stated all she needs is for someone to send in her Rx because she know why her legs are tight, she has not taken her BP medication. Informed patient that I would speak with the dr on call and call her back.

## 2015-12-31 NOTE — Telephone Encounter (Signed)
Relation to FR:TMYT Call back [phone]   Reason for call:  Patient called requesting cellulites and blood pressure medication, advised patient PCP is out of the office and will return Monday offered appointment. Patient states she needed her medication now and will follow up with PCP next week. Patient states she's experiencing leg pain "tightness" in the leg transferred to [facility].

## 2016-01-01 NOTE — Telephone Encounter (Signed)
Patient has an apt scheduled for 01/05/16 but canceled after a 30 day supply of her medication was sent by Dr.Copland.     KP

## 2016-01-03 ENCOUNTER — Other Ambulatory Visit: Payer: Self-pay | Admitting: Family Medicine

## 2016-01-03 DIAGNOSIS — I10 Essential (primary) hypertension: Secondary | ICD-10-CM

## 2016-01-04 NOTE — Telephone Encounter (Signed)
No more rx until appointment is made

## 2016-01-05 ENCOUNTER — Ambulatory Visit: Payer: Self-pay | Admitting: Family Medicine

## 2016-01-08 ENCOUNTER — Telehealth: Payer: Self-pay | Admitting: Family Medicine

## 2016-01-08 DIAGNOSIS — I10 Essential (primary) hypertension: Secondary | ICD-10-CM

## 2016-01-08 MED ORDER — LISINOPRIL-HYDROCHLOROTHIAZIDE 20-12.5 MG PO TABS: 2.0000 | ORAL_TABLET | Freq: Every day | ORAL | 1 refills | Status: DC

## 2016-01-08 NOTE — Telephone Encounter (Signed)
Please advise      KP 

## 2016-01-08 NOTE — Telephone Encounter (Signed)
Fill until appointment

## 2016-01-08 NOTE — Addendum Note (Signed)
Addended by: Magdalene Molly A on: 01/08/2016 03:11 PM   Modules accepted: Orders

## 2016-01-08 NOTE — Telephone Encounter (Signed)
Caller name: Relationship to patient: Self Can be reached: [phone]  Pharmacy:  [facility] [address] [place] [address] [phone] (Phone) [phone] (Fax)     Reason for call: Request refill lisinopril-hydrochlorothiazide (PRINZIDE,ZESTORETIC) 20-12.5 MG tablet [[id number]] Patient last seen in office 11/21/14. Given a partial Rx while provider was out of office and informed she needed to be seen. Scheduled appt today for 02/22/16. Plse adv

## 2016-01-24 ENCOUNTER — Encounter (HOSPITAL_COMMUNITY): Payer: Self-pay

## 2016-01-24 ENCOUNTER — Emergency Department (HOSPITAL_COMMUNITY)
Admission: EM | Admit: 2016-01-24 | Discharge: 2016-01-25 | Disposition: A | Discharge status: Home / Self Care | Payer: Self-pay | Attending: Emergency Medicine | Admitting: Emergency Medicine

## 2016-01-24 DIAGNOSIS — Z79899 Other long term (current) drug therapy: Secondary | ICD-10-CM | POA: Insufficient documentation

## 2016-01-24 DIAGNOSIS — R609 Edema, unspecified: Secondary | ICD-10-CM

## 2016-01-24 DIAGNOSIS — R600 Localized edema: Secondary | ICD-10-CM | POA: Insufficient documentation

## 2016-01-24 DIAGNOSIS — I10 Essential (primary) hypertension: Secondary | ICD-10-CM | POA: Insufficient documentation

## 2016-01-24 LAB — BASIC METABOLIC PANEL
Anion gap: 5 (ref 5–15)
BUN: 29 mg/dL — ABNORMAL HIGH (ref 6–20)
CO2: 25 mmol/L (ref 22–32)
Calcium: 12.6 mg/dL — ABNORMAL HIGH (ref 8.9–10.3)
Chloride: 108 mmol/L (ref 101–111)
Creatinine, Ser: 1.32 mg/dL — ABNORMAL HIGH (ref 0.44–1.00)
GFR calc Af Amer: 50 mL/min — ABNORMAL LOW (ref >60)
GFR calc non Af Amer: 43 mL/min — ABNORMAL LOW (ref >60)
Glucose, Bld: 174 mg/dL — ABNORMAL HIGH (ref 65–99)
Potassium: 4 mmol/L (ref 3.5–5.1)
Sodium: 138 mmol/L (ref 135–145)

## 2016-01-24 NOTE — Discharge Instructions (Signed)
Follow-up with your doctor to have your diuretic medications adjusted. Your creatinine today was 1.32 your doctor needs to follow-up on this as well

## 2016-01-24 NOTE — ED Triage Notes (Signed)
Swelling in both legs for over a week states has a hx of cellulitis no fever complains of chills.

## 2016-01-24 NOTE — ED Provider Notes (Signed)
[facility] DEPT Provider Note   CSN: [id number] Arrival date & time: 01/24/16  2026     History   Chief Complaint Chief Complaint  Patient presents with  . Leg Swelling    HPI Deborah Figueroa is a 60 y.o. female.  60 year old female presents with worsening bilateral lower extremity edema. History of similar symptoms 6 months but the past week has noted bilateral swelling from above the knee down to her feet. Denies any shortness of breath orthopnea. No pleuritic chest pain. Does stand on her feet but a bit. No recent history of trauma. No significant paresthesias to her feet. Symptoms persistent and nothing makes them better. No treatment use prior to arrival      Past Medical History:  Diagnosis Date  . Cellulitis   . Depression   . Hyperlipidemia   . Hypertension   . Seasonal allergies     Patient Active Problem List   Diagnosis Date Noted  . Toenail fungus 07/31/2012  . HYPERCALCEMIA 04/10/2009  . HYPERLIPIDEMIA 02/06/2008  . DEPRESSION 02/06/2008  . HYPERTENSION 02/06/2008    Past Surgical History:  Procedure Laterality Date  . BACK SURGERY    . BREAST SURGERY     lumpectomy left breast  . LUMBAR FUSION      OB History    No data available       Home Medications    Prior to Admission medications   Medication Sig Start Date End Date Taking? Authorizing Provider  atenolol (TENORMIN) 100 MG tablet Take 1 tablet (100 mg total) by mouth daily. Last refill 12/31/15  Yes Gay Filler Copland, MD  atorvastatin (LIPITOR) 40 MG tablet Take 1 tablet (40 mg total) by mouth daily. 12/03/14  Yes Yvonne R Lowne Chase, DO  lisinopril-hydrochlorothiazide (PRINZIDE,ZESTORETIC) 20-12.5 MG tablet Take 2 tablets by mouth daily. Office visit due now.  Last refill 01/08/16  Yes Alferd Apa Lowne Chase, DO  naproxen sodium (ANAPROX) 220 MG tablet Take 220 mg by mouth 2 (two) times daily as needed (for pain.).   Yes Historical Provider, MD  sertraline (ZOLOFT) 100 MG  tablet TAKE 1 TABLET (100 MG TOTAL) BY MOUTH DAILY. OFFICE VISIT DUE NOW 11/25/14  Yes Alferd Apa Lowne Chase, DO  furosemide (LASIX) 40 MG tablet Take 1 tablet (40 mg total) by mouth daily. Patient not taking: Reported on 01/24/2016 0/92/33   Delora Fuel, MD    Family History Family History  Problem Relation Age of Onset  . Stroke Mother   . Hypertension Mother   . Hyperlipidemia Mother   . Cancer Father 21    liver and colon  . Depression Father   . Cancer Maternal Aunt     breast  . Diabetes Brother   . Cancer Maternal Grandmother     breast    Social History Social History  Substance Use Topics  . Smoking status: Never Smoker  . Smokeless tobacco: Never Used  . Alcohol use No     Allergies   Patient has no known allergies.   Review of Systems Review of Systems  All other systems reviewed and are negative.    Physical Exam Updated Vital Signs BP 143/83 (BP Location: Left Arm)   Pulse 82   Temp 98.1 F (36.7 C) (Oral)   Resp 16   Ht 5\' 10"  (1.778 m)   Wt 103.9 kg   SpO2 98%   BMI 32.86 kg/m   Physical Exam  Constitutional: She is oriented to person, place, and time.  She appears well-developed and well-nourished.  Non-toxic appearance. No distress.  HENT:  Head: Normocephalic and atraumatic.  Eyes: Conjunctivae, EOM and lids are normal. Pupils are equal, round, and reactive to light.  Neck: Normal range of motion. Neck supple. No tracheal deviation present. No thyroid mass present.  Cardiovascular: Normal rate, regular rhythm and normal heart sounds.  Exam reveals no gallop.   No murmur heard. Pulmonary/Chest: Effort normal and breath sounds normal. No stridor. No respiratory distress. She has no decreased breath sounds. She has no wheezes. She has no rhonchi. She has no rales.  Abdominal: Soft. Normal appearance and bowel sounds are normal. She exhibits no distension. There is no tenderness. There is no rebound and no CVA tenderness.  Musculoskeletal:  Normal range of motion. She exhibits no edema or tenderness.  3+ bilateral lower extremity edema without evidence of vascular compromise  Neurological: She is alert and oriented to person, place, and time. She has normal strength. No cranial nerve deficit or sensory deficit. GCS eye subscore is 4. GCS verbal subscore is 5. GCS motor subscore is 6.  Skin: Skin is warm and dry. No abrasion and no rash noted.  Psychiatric: She has a normal mood and affect. Her speech is normal and behavior is normal.  Nursing note and vitals reviewed.    ED Treatments / Results  Labs (all labs ordered are listed, but only abnormal results are displayed) Labs Reviewed  BASIC METABOLIC PANEL    EKG  EKG Interpretation None       Radiology No results found.  Procedures Procedures (including critical care time)  Medications Ordered in ED Medications - No data to display   Initial Impression / Assessment and Plan / ED Course  I have reviewed the triage vital signs and the nursing notes.  Pertinent labs & imaging results that were available during my care of the patient were reviewed by me and considered in my medical decision making (see chart for details).  Clinical Course     Patient's creatinine noted and only slightly elevated from one 6 months ago. Instructed to follow-up with her doctor tomorrow to have her medications adjusted  Final Clinical Impressions(s) / ED Diagnoses   Final diagnoses:  None    New Prescriptions New Prescriptions   No medications on file     Lacretia Leigh, MD 01/24/16 2254

## 2016-01-25 ENCOUNTER — Ambulatory Visit (INDEPENDENT_AMBULATORY_CARE_PROVIDER_SITE_OTHER): Payer: Self-pay | Admitting: Family Medicine

## 2016-01-25 ENCOUNTER — Encounter: Payer: Self-pay | Admitting: Family Medicine

## 2016-01-25 ENCOUNTER — Other Ambulatory Visit: Payer: Self-pay

## 2016-01-25 ENCOUNTER — Ambulatory Visit: Payer: Self-pay | Admitting: Family Medicine

## 2016-01-25 ENCOUNTER — Telehealth: Payer: Self-pay

## 2016-01-25 VITALS — BP 180/98 | HR 69 | Temp 98.4°F | Ht 71.0 in | Wt 247.0 lb

## 2016-01-25 DIAGNOSIS — I10 Essential (primary) hypertension: Secondary | ICD-10-CM

## 2016-01-25 DIAGNOSIS — R609 Edema, unspecified: Secondary | ICD-10-CM

## 2016-01-25 DIAGNOSIS — R0683 Snoring: Secondary | ICD-10-CM

## 2016-01-25 MED ORDER — METOPROLOL SUCCINATE ER 50 MG PO TB24: 50.0000 mg | ORAL_TABLET | Freq: Every day | ORAL | 1 refills | Status: DC

## 2016-01-25 NOTE — Progress Notes (Signed)
Chief Complaint  Patient presents with  . Leg Swelling    Pt reports tired weak and dizzy/Pt reports leg swelling     Subjective: Patient is a 60 y.o. female here for leg swelling.  Lower extremity edema ER f/u for b/l swelling, started 6 mo ago after an infection. Got worse recently.  Swelling is equal on both sides. She denies any calf tenderness. She does not have any history of nephrotic syndrome, liver failure, or heart failure. She denies shortness of breath or cough.  Snoring Does have hx of snoring and poor restfulness. She has never been told she stops breathing at night. No hx of OSA. Never has had a sleep study. Does have poorly controlled HTN.  Hypertension Patient presents for hypertension follow up. She is compliant with medications- Atenolol 100 mg daily, Prinzide 20-12.5 mg daily. Patient has these side effects of medication: none She specifically denies headache, visual changes, chest pain, palpitations, dyspnea, orthopnea, PND or peripheral edema. She is sometimes adhering to a low sodium and low fat diet. She exercises rarely.  ROS: Heart: Denies chest pain or palpitations, +LE edema Lungs: Denies SOB or cough  Family History  Problem Relation Age of Onset  . Stroke Mother   . Hypertension Mother   . Hyperlipidemia Mother   . Cancer Father 11    liver and colon  . Depression Father   . Cancer Maternal Aunt     breast  . Diabetes Brother   . Cancer Maternal Grandmother     breast   Past Medical History:  Diagnosis Date  . Cellulitis   . Depression   . Hyperlipidemia   . Hypertension   . Seasonal allergies    No Known Allergies  Current Outpatient Prescriptions:  .  lisinopril-hydrochlorothiazide (PRINZIDE,ZESTORETIC) 20-12.5 MG tablet, Take 2 tablets by mouth daily. Office visit due now.  Last refill, Disp: 60 tablet, Rfl: 1 .  sertraline (ZOLOFT) 100 MG tablet, TAKE 1 TABLET (100 MG TOTAL) BY MOUTH DAILY. OFFICE VISIT DUE NOW, Disp: 90 tablet,  Rfl: 1 .  atorvastatin (LIPITOR) 40 MG tablet, Take 1 tablet (40 mg total) by mouth daily., Disp: 30 tablet, Rfl: 2 .  metoprolol succinate (TOPROL-XL) 50 MG 24 hr tablet, Take 1 tablet (50 mg total) by mouth daily. Take with or immediately following a meal., Disp: 30 tablet, Rfl: 1  Objective: BP (!) 180/98 (BP Location: Left Arm, Patient Position: Sitting, Cuff Size: Large)   Pulse 69   Temp 98.4 F (36.9 C) (Oral)   Ht 5\' 11"  (1.803 m)   Wt 247 lb (112 kg)   SpO2 98%   BMI 34.45 kg/m  General: Awake, appears stated age HEENT: MMM, EOMi Heart: RRR, no murmurs, no bruits, no JVD, +3 pitting edema bilaterally up to the proximal third of tibia Lungs: CTAB, no rales, wheezes or rhonchi. No accessory muscle use Abd: BS+, soft, NT, ND, no masses or organomegaly MSK: No calf TTP, neg Homan's b/l Psych: Age appropriate judgment and insight, normal affect and mood  Assessment and Plan: Dependent edema  Snoring - Plan: Ambulatory referral to Pulmonology  Essential hypertension  Orders as above. I will replace her atenolol with metoprolol for better blood pressure coverage. Stop naproxen, do not want her on both lisinopril and naproxen in the setting of worsening renal function. I would like her to come back in 4 weeks to recheck her blood pressure. I certainly believe that sleep apnea could be playing a role. As amlodipine and  hydralazine can have lower extremity edema as a side effect, will hold off on prescribing these. With her questionable renal function, I will not increase her Prinzide at this time. I will hold off on Lasix as this is likely dependent edema. Compression stockings and routine elevation will be helpful as well as physical exertion. Prescription given for compression stockings. The patient voiced understanding and agreement to the plan.  Hunt, DO 01/25/16  4:51 PM

## 2016-01-25 NOTE — Patient Instructions (Addendum)
Try to wear the compression stockings as much as possible while awake.  Keep your legs elevated when able. Walk as much as possible and as able.  Stop taking your atenolol for now. Start taking metoprolol.

## 2016-01-25 NOTE — Telephone Encounter (Signed)
Patient walked in to office. States she hs swelling in her feet and legs. Denies shortness of breath and chest pain. Upon accessment of legs noted both legs edematous. No weeping noted. Upon touch no pitting edema but the legs were very tight to touch. No excessive redness noted. Patient states she has also had numbness in left hand from sciatic nerve. States she had a fall recently and she has swelling in her leftr hand and bruising on her left breast (Noted). Patient states he was seen in ED recently.

## 2016-01-25 NOTE — Progress Notes (Signed)
Pre visit review using our clinic review tool, if applicable. No additional management support is needed unless otherwise documented below in the visit note. 

## 2016-02-19 ENCOUNTER — Other Ambulatory Visit: Payer: Self-pay | Admitting: Family Medicine

## 2016-02-22 ENCOUNTER — Ambulatory Visit: Payer: Self-pay | Admitting: Family Medicine

## 2016-03-08 ENCOUNTER — Other Ambulatory Visit: Payer: Self-pay | Admitting: Family Medicine

## 2016-03-08 DIAGNOSIS — I10 Essential (primary) hypertension: Secondary | ICD-10-CM

## 2016-04-19 ENCOUNTER — Other Ambulatory Visit: Payer: Self-pay | Admitting: Family Medicine

## 2016-04-22 NOTE — Telephone Encounter (Signed)
Relation to pt: self  Call back number:[phone] Pharmacy: [facility] [address] [place] [address] [phone])     Reason for call:  Patient checking on the status of metoprolol succinate (TOPROL-XL) 50 MG 24 hr tablet, pharmacy sent in request 04/19/16 and patient states completely out. Patient  would like a email when Rx is sent, please advise

## 2016-04-28 ENCOUNTER — Other Ambulatory Visit: Payer: Self-pay | Admitting: *Deleted

## 2016-04-28 MED ORDER — METOPROLOL SUCCINATE ER 50 MG PO TB24: ORAL_TABLET | ORAL | 0 refills | Status: DC

## 2016-05-27 ENCOUNTER — Ambulatory Visit: Payer: Self-pay | Admitting: Family Medicine

## 2016-05-30 ENCOUNTER — Encounter: Payer: Self-pay | Admitting: Family Medicine

## 2016-06-23 ENCOUNTER — Telehealth: Payer: Self-pay | Admitting: Family Medicine

## 2016-06-23 DIAGNOSIS — I10 Essential (primary) hypertension: Secondary | ICD-10-CM

## 2016-06-23 MED ORDER — SERTRALINE HCL 100 MG PO TABS: 100.0000 mg | ORAL_TABLET | Freq: Every day | ORAL | 0 refills | Status: DC

## 2016-06-23 MED ORDER — LISINOPRIL-HYDROCHLOROTHIAZIDE 20-12.5 MG PO TABS: 2.0000 | ORAL_TABLET | Freq: Every day | ORAL | 0 refills | Status: DC

## 2016-06-23 MED ORDER — ATORVASTATIN CALCIUM 40 MG PO TABS: 40.0000 mg | ORAL_TABLET | Freq: Every day | ORAL | 0 refills | Status: DC

## 2016-06-23 MED ORDER — METOPROLOL SUCCINATE ER 50 MG PO TB24: ORAL_TABLET | ORAL | 0 refills | Status: DC

## 2016-06-23 NOTE — Telephone Encounter (Signed)
Patient called in to request refills on all meds/but had been denied due to needing appt. But did schedule her with dr. Etter Figueroa on 07/04/16 and did send in a #30 day of each of her meds.  Did inform to please keep this appointment.she verbalized understanding.

## 2016-06-29 ENCOUNTER — Telehealth: Payer: Self-pay | Admitting: Family Medicine

## 2016-06-29 NOTE — Telephone Encounter (Signed)
Caller name: Relationship to patient: Self Can be reached: [phone]  Pharmacy:  Reason for call: Patient called to request a referral to a Dermatologist for a mole.

## 2016-06-30 LAB — HM MAMMOGRAPHY

## 2016-06-30 NOTE — Telephone Encounter (Signed)
Called the patient no answer/voice mail not set up

## 2016-06-30 NOTE — Telephone Encounter (Signed)
If she is wanting it removed and sent to the lab, we can do that here. Otherwise OK to place. TY.

## 2016-07-04 ENCOUNTER — Ambulatory Visit: Payer: Self-pay | Admitting: Family Medicine

## 2016-07-15 ENCOUNTER — Encounter: Payer: Self-pay | Admitting: Family Medicine

## 2016-07-29 ENCOUNTER — Telehealth: Payer: Self-pay | Admitting: Family Medicine

## 2016-07-29 DIAGNOSIS — I10 Essential (primary) hypertension: Secondary | ICD-10-CM

## 2016-07-29 MED ORDER — LISINOPRIL-HYDROCHLOROTHIAZIDE 20-12.5 MG PO TABS: 2.0000 | ORAL_TABLET | Freq: Every day | ORAL | 0 refills | Status: DC

## 2016-07-29 MED ORDER — METOPROLOL SUCCINATE ER 50 MG PO TB24: ORAL_TABLET | ORAL | 0 refills | Status: DC

## 2016-07-29 MED ORDER — ATORVASTATIN CALCIUM 40 MG PO TABS: 40.0000 mg | ORAL_TABLET | Freq: Every day | ORAL | 0 refills | Status: DC

## 2016-07-29 MED ORDER — SERTRALINE HCL 100 MG PO TABS: 100.0000 mg | ORAL_TABLET | Freq: Every day | ORAL | 0 refills | Status: DC

## 2016-07-29 NOTE — Telephone Encounter (Signed)
Refill done and patient notified. 

## 2016-07-29 NOTE — Telephone Encounter (Signed)
Relation to JM:EQAS Call back number:[phone] Pharmacy:  [facility] [address], [place] [address] [phone] (Phone) [phone] (Fax)     Reason for call:  Patient requesting all medication refill, patient scheduled follow up for 08/04/16 2:15pm. Please advise

## 2016-08-04 ENCOUNTER — Ambulatory Visit: Payer: Self-pay | Admitting: Family Medicine

## 2016-08-11 ENCOUNTER — Ambulatory Visit (INDEPENDENT_AMBULATORY_CARE_PROVIDER_SITE_OTHER): Payer: Self-pay | Admitting: Family Medicine

## 2016-08-11 ENCOUNTER — Encounter: Payer: Self-pay | Admitting: Family Medicine

## 2016-08-11 VITALS — BP 136/86 | HR 93 | Temp 97.5°F | Resp 16 | Ht 71.0 in | Wt 218.6 lb

## 2016-08-11 DIAGNOSIS — I10 Essential (primary) hypertension: Secondary | ICD-10-CM

## 2016-08-11 DIAGNOSIS — Z1159 Encounter for screening for other viral diseases: Secondary | ICD-10-CM

## 2016-08-11 DIAGNOSIS — F32A Depression, unspecified: Secondary | ICD-10-CM

## 2016-08-11 DIAGNOSIS — N760 Acute vaginitis: Secondary | ICD-10-CM

## 2016-08-11 DIAGNOSIS — F329 Major depressive disorder, single episode, unspecified: Secondary | ICD-10-CM

## 2016-08-11 DIAGNOSIS — E785 Hyperlipidemia, unspecified: Secondary | ICD-10-CM

## 2016-08-11 LAB — LIPID PANEL
Cholesterol: 241 mg/dL — ABNORMAL HIGH (ref 0–200)
HDL: 62.2 mg/dL (ref >39.00)
LDL Cholesterol: 140 mg/dL — ABNORMAL HIGH (ref 0–99)
NonHDL: 178.83
Total CHOL/HDL Ratio: 4
Triglycerides: 194 mg/dL — ABNORMAL HIGH (ref 0.0–149.0)
VLDL: 38.8 mg/dL (ref 0.0–40.0)

## 2016-08-11 LAB — COMPREHENSIVE METABOLIC PANEL
ALT: 16 U/L (ref 0–35)
AST: 13 U/L (ref 0–37)
Albumin: 4.1 g/dL (ref 3.5–5.2)
Alkaline Phosphatase: 151 U/L — ABNORMAL HIGH (ref 39–117)
BUN: 34 mg/dL — ABNORMAL HIGH (ref 6–23)
CO2: 25 mEq/L (ref 19–32)
Calcium: 12.8 mg/dL — ABNORMAL HIGH (ref 8.4–10.5)
Chloride: 104 mEq/L (ref 96–112)
Creatinine, Ser: 1.23 mg/dL — ABNORMAL HIGH (ref 0.40–1.20)
GFR: 47.16 mL/min — ABNORMAL LOW (ref >60.00)
Glucose, Bld: 162 mg/dL — ABNORMAL HIGH (ref 70–99)
Potassium: 3.8 mEq/L (ref 3.5–5.1)
Sodium: 134 mEq/L — ABNORMAL LOW (ref 135–145)
Total Bilirubin: 0.4 mg/dL (ref 0.2–1.2)
Total Protein: 7.2 g/dL (ref 6.0–8.3)

## 2016-08-11 LAB — HEPATITIS C ANTIBODY: HCV Ab: NEGATIVE

## 2016-08-11 MED ORDER — TERCONAZOLE 0.4 % VA CREA: 1.0000 | TOPICAL_CREAM | Freq: Every day | VAGINAL | 0 refills | Status: DC

## 2016-08-11 MED ORDER — METOPROLOL SUCCINATE ER 100 MG PO TB24: 100.0000 mg | ORAL_TABLET | Freq: Every day | ORAL | 2 refills | Status: DC

## 2016-08-11 NOTE — Patient Instructions (Signed)

## 2016-08-11 NOTE — Progress Notes (Signed)
Patient ID: Deborah Figueroa, female   DOB: 06/22/1955, 61 y.o.   MRN: [id number]    Subjective:  I acted as a Education administrator for Dr. Carollee Herter.  Guerry Bruin, [place]   Patient ID: Deborah Figueroa, female    DOB: 10/23/1955, 61 y.o.   MRN: [id number]  Chief Complaint  Patient presents with  . Hypertension  . Hyperlipidemia  . Depression    HPI  Patient is in today for follow up blood pressure, cholesterol, depression.  Doing well on current treatment. Still has a high stressed life.    Patient Care Team: Carollee Herter, Alferd Apa, DO as PCP - General   Past Medical History:  Diagnosis Date  . Cellulitis   . Depression   . Hyperlipidemia   . Hypertension   . Seasonal allergies     Past Surgical History:  Procedure Laterality Date  . BACK SURGERY    . BREAST SURGERY     lumpectomy left breast  . LUMBAR FUSION      Family History  Problem Relation Age of Onset  . Stroke Mother   . Hypertension Mother   . Hyperlipidemia Mother   . Cancer Father 68       liver and colon  . Depression Father   . Cancer Maternal Aunt        breast  . Diabetes Brother   . Cancer Maternal Grandmother        breast    Social History   Social History  . Marital status: Divorced    Spouse name: N/A  . Number of children: N/A  . Years of education: N/A   Occupational History  . Not on file.   Social History Main Topics  . Smoking status: Never Smoker  . Smokeless tobacco: Never Used  . Alcohol use No  . Drug use: No  . Sexual activity: Not on file   Other Topics Concern  . Not on file   Social History Narrative  . No narrative on file    Outpatient Medications Prior to Visit  Medication Sig Dispense Refill  . atorvastatin (LIPITOR) 40 MG tablet Take 1 tablet (40 mg total) by mouth daily. 30 tablet 0  . lisinopril-hydrochlorothiazide (PRINZIDE,ZESTORETIC) 20-12.5 MG tablet Take 2 tablets by mouth daily. 60 tablet 0  . sertraline (ZOLOFT) 100 MG tablet Take 1 tablet (100 mg total) by  mouth daily. 30 tablet 0  . metoprolol succinate (TOPROL-XL) 50 MG 24 hr tablet TAKE ONE TABLET BY MOUTH DAILY WITH OR IMMEDIATELY FOLLOWING A MEAL 30 tablet 0   No facility-administered medications prior to visit.     No Known Allergies  Review of Systems  Constitutional: Negative for fever and malaise/fatigue.  HENT: Negative for congestion.   Eyes: Negative for blurred vision.  Respiratory: Negative for cough and shortness of breath.   Cardiovascular: Negative for chest pain, palpitations and leg swelling.  Gastrointestinal: Negative for vomiting.  Musculoskeletal: Negative for back pain.  Skin: Negative for rash.  Neurological: Negative for loss of consciousness and headaches.  Psychiatric/Behavioral: Negative for depression. The patient is not nervous/anxious.        Objective:    Physical Exam  Constitutional: She is oriented to person, place, and time. She appears well-developed and well-nourished. No distress.  HENT:  Head: Normocephalic and atraumatic.  Eyes: Conjunctivae are normal.  Neck: Normal range of motion. No thyromegaly present.  Cardiovascular: Normal rate and regular rhythm.   Pulmonary/Chest: Effort normal and breath sounds normal. She has  no wheezes.  Abdominal: Soft. Bowel sounds are normal. There is no tenderness.  Musculoskeletal: Normal range of motion. She exhibits no edema or deformity.  Neurological: She is alert and oriented to person, place, and time.  Skin: Skin is warm and dry. She is not diaphoretic.  Psychiatric: She has a normal mood and affect. Her behavior is normal. Judgment and thought content normal.  Nursing note and vitals reviewed.   BP 136/86 (BP Location: Left Arm, Cuff Size: Normal)   Pulse 93   Temp 97.5 F (36.4 C) (Oral)   Resp 16   Ht 5\' 11"  (1.803 m)   Wt 218 lb 9.6 oz (99.2 kg)   SpO2 97%   BMI 30.49 kg/m  Wt Readings from Last 3 Encounters:  08/11/16 218 lb 9.6 oz (99.2 kg)  01/25/16 247 lb (112 kg)  01/24/16  229 lb (103.9 kg)   BP Readings from Last 3 Encounters:  08/11/16 136/86  01/25/16 (!) 180/98  01/24/16 146/85     Immunization History  Administered Date(s) Administered  . Tdap 01/04/2015    Health Maintenance  Topic Date Due  . Hepatitis C Screening  [birth year]  . PAP SMEAR  10/24/2009  . INFLUENZA VACCINE  10/12/2016  . MAMMOGRAM  07/01/2018  . COLONOSCOPY  08/26/2019  . TETANUS/TDAP  01/03/2025  . HIV Screening  Completed    Lab Results  Component Value Date   WBC 5.0 08/27/2015   HGB 11.7 (L) 08/27/2015   HCT 35.0 (L) 08/27/2015   PLT 191 08/27/2015   GLUCOSE 174 (H) 01/24/2016   CHOL 313 (H) 11/21/2014   TRIG 265.0 (H) 11/21/2014   HDL 50.80 11/21/2014   LDLDIRECT 82.0 11/21/2014   LDLCALC 201 (H) 11/01/2013   ALT 30 08/27/2015   AST 23 08/27/2015   NA 138 01/24/2016   K 4.0 01/24/2016   CL 108 01/24/2016   CREATININE 1.32 (H) 01/24/2016   BUN 29 (H) 01/24/2016   CO2 25 01/24/2016   TSH 0.56 06/09/2011    Lab Results  Component Value Date   TSH 0.56 06/09/2011   Lab Results  Component Value Date   WBC 5.0 08/27/2015   HGB 11.7 (L) 08/27/2015   HCT 35.0 (L) 08/27/2015   MCV 86.2 08/27/2015   PLT 191 08/27/2015   Lab Results  Component Value Date   NA 138 01/24/2016   K 4.0 01/24/2016   CO2 25 01/24/2016   GLUCOSE 174 (H) 01/24/2016   BUN 29 (H) 01/24/2016   CREATININE 1.32 (H) 01/24/2016   BILITOT 0.8 08/27/2015   ALKPHOS 114 08/27/2015   AST 23 08/27/2015   ALT 30 08/27/2015   PROT 7.4 08/27/2015   ALBUMIN 4.1 08/27/2015   CALCIUM 12.6 (H) 01/24/2016   ANIONGAP 5 01/24/2016   GFR 48.81 (L) 11/21/2014   Lab Results  Component Value Date   CHOL 313 (H) 11/21/2014   Lab Results  Component Value Date   HDL 50.80 11/21/2014   Lab Results  Component Value Date   LDLCALC 201 (H) 11/01/2013   Lab Results  Component Value Date   TRIG 265.0 (H) 11/21/2014   Lab Results  Component Value Date   CHOLHDL 6 11/21/2014   No  results found for: HGBA1C       Assessment & Plan:   Problem List Items Addressed This Visit      Unprioritized   Essential hypertension    Poorly controlled will alter medications, encouraged DASH diet, minimize caffeine and obtain  adequate sleep. Report concerning symptoms and follow up as directed and as needed      Relevant Medications   metoprolol succinate (TOPROL XL) 100 MG 24 hr tablet   Other Relevant Orders   Lipid panel   Comprehensive metabolic panel   Lipid panel   Comprehensive metabolic panel   Hyperlipidemia - Primary    Tolerating statin, encouraged heart healthy diet, avoid trans fats, minimize simple carbs and saturated fats. Increase exercise as tolerated      Relevant Medications   metoprolol succinate (TOPROL XL) 100 MG 24 hr tablet   terconazole (TERAZOL 7) 0.4 % vaginal cream   Other Relevant Orders   Lipid panel   Comprehensive metabolic panel   Lipid panel   Comprehensive metabolic panel    Other Visit Diagnoses    Depression, unspecified depression type       Need for hepatitis C screening test       Relevant Orders   Hepatitis C antibody      I have discontinued Ms. Rountree's metoprolol succinate. I am also having her start on metoprolol succinate and terconazole. Additionally, I am having her maintain her atorvastatin, lisinopril-hydrochlorothiazide, sertraline, and SANTYL.  Meds ordered this encounter  Medications  . SANTYL ointment  . metoprolol succinate (TOPROL XL) 100 MG 24 hr tablet    Sig: Take 1 tablet (100 mg total) by mouth daily. Take with or immediately following a meal.    Dispense:  30 tablet    Refill:  2  . terconazole (TERAZOL 7) 0.4 % vaginal cream    Sig: Place 1 applicator vaginally at bedtime.    Dispense:  45 g    Refill:  0    CMA served as scribe during this visit. History, Physical and Plan performed by medical provider. Documentation and orders reviewed and attested to.  Ann Held, DO

## 2016-08-11 NOTE — Assessment & Plan Note (Signed)
Tolerating statin, encouraged heart healthy diet, avoid trans fats, minimize simple carbs and saturated fats. Increase exercise as tolerated 

## 2016-08-11 NOTE — Assessment & Plan Note (Signed)
Poorly controlled will alter medications, encouraged DASH diet, minimize caffeine and obtain adequate sleep. Report concerning symptoms and follow up as directed and as needed 

## 2016-08-12 ENCOUNTER — Encounter: Payer: Self-pay | Admitting: Family Medicine

## 2016-08-12 DIAGNOSIS — E1165 Type 2 diabetes mellitus with hyperglycemia: Secondary | ICD-10-CM | POA: Insufficient documentation

## 2016-08-12 DIAGNOSIS — IMO0002 Reserved for concepts with insufficient information to code with codable children: Secondary | ICD-10-CM | POA: Insufficient documentation

## 2016-08-15 ENCOUNTER — Other Ambulatory Visit: Payer: Self-pay | Admitting: Family Medicine

## 2016-08-15 DIAGNOSIS — R739 Hyperglycemia, unspecified: Secondary | ICD-10-CM

## 2016-08-15 MED ORDER — FENOFIBRATE 160 MG PO TABS: 160.0000 mg | ORAL_TABLET | Freq: Every day | ORAL | 3 refills | Status: DC

## 2016-08-16 ENCOUNTER — Other Ambulatory Visit: Payer: Self-pay

## 2016-08-17 ENCOUNTER — Other Ambulatory Visit: Payer: Self-pay

## 2016-08-17 ENCOUNTER — Ambulatory Visit: Payer: Self-pay

## 2016-08-19 ENCOUNTER — Telehealth: Payer: Self-pay | Admitting: Family Medicine

## 2016-08-19 ENCOUNTER — Other Ambulatory Visit: Payer: Self-pay | Admitting: Family Medicine

## 2016-08-19 DIAGNOSIS — I10 Essential (primary) hypertension: Secondary | ICD-10-CM

## 2016-08-19 MED ORDER — SERTRALINE HCL 100 MG PO TABS: 100.0000 mg | ORAL_TABLET | Freq: Every day | ORAL | 5 refills | Status: DC

## 2016-08-19 MED ORDER — LISINOPRIL-HYDROCHLOROTHIAZIDE 20-12.5 MG PO TABS: 2.0000 | ORAL_TABLET | Freq: Every day | ORAL | 5 refills | Status: DC

## 2016-08-19 NOTE — Telephone Encounter (Signed)
Caller name: Relationship to patient: Self Can be reached: [phone]  Pharmacy:  [facility] [address] [place] [address] [phone] (Phone) [phone] (Fax)     Reason for call: Refill lisinopril-hydrochlorothiazide (PRINZIDE,ZESTORETIC) 20-12.5 MG tablet   sertraline (ZOLOFT) 100 MG tablet

## 2016-08-19 NOTE — Telephone Encounter (Signed)
Patient notified that rx was sent in.  Patient states ins will not pay until the 11th.  Patient does not work at current job any more and just wanted refills before ins is cut off.

## 2016-08-23 ENCOUNTER — Other Ambulatory Visit (INDEPENDENT_AMBULATORY_CARE_PROVIDER_SITE_OTHER): Payer: Self-pay

## 2016-08-23 ENCOUNTER — Ambulatory Visit (INDEPENDENT_AMBULATORY_CARE_PROVIDER_SITE_OTHER): Payer: Self-pay

## 2016-08-23 DIAGNOSIS — E119 Type 2 diabetes mellitus without complications: Secondary | ICD-10-CM

## 2016-08-23 DIAGNOSIS — R739 Hyperglycemia, unspecified: Secondary | ICD-10-CM

## 2016-08-23 LAB — HEMOGLOBIN A1C: Hgb A1c MFr Bld: 8.3 % — ABNORMAL HIGH (ref 4.6–6.5)

## 2016-08-23 NOTE — Progress Notes (Addendum)
Pre visit review using our clinic tool,if applicable. No additional management support is needed unless otherwise documented below in the visit note.   Patient in today per order from Dr. Carollee Herter for HgA1c and diabetic patient teaching dated 08/11/16.  Patient had lab draw then came for diabetic teaching. Reviewed diabetic information with patient as well as instructions on how to use One Touch Verio Flex glucometer. Patient given return demonstration on how to use machine. Demonstration successful with much help.  Advised patient that she could also read instructions that came with device.   Patient also given Diabetic log book and diabetic diet information as well as general Diabetic material.     Reviewed--Yvonne R Carollee Herter, DO

## 2016-08-23 NOTE — Progress Notes (Signed)
  Patient in for diabetic education. Completed with return demonstration

## 2016-08-24 ENCOUNTER — Other Ambulatory Visit: Payer: Self-pay | Admitting: *Deleted

## 2016-08-24 LAB — PTH, INTACT AND CALCIUM
Calcium: 12.6 mg/dL — ABNORMAL HIGH (ref 8.6–10.4)
PTH: 547 pg/mL — ABNORMAL HIGH (ref 14–64)

## 2016-08-25 ENCOUNTER — Ambulatory Visit (INDEPENDENT_AMBULATORY_CARE_PROVIDER_SITE_OTHER): Payer: BLUE CROSS/BLUE SHIELD | Admitting: Family Medicine

## 2016-08-25 ENCOUNTER — Encounter: Payer: Self-pay | Admitting: Family Medicine

## 2016-08-25 ENCOUNTER — Other Ambulatory Visit: Payer: Self-pay | Admitting: Family Medicine

## 2016-08-25 VITALS — Wt 213.8 lb

## 2016-08-25 DIAGNOSIS — I10 Essential (primary) hypertension: Secondary | ICD-10-CM | POA: Diagnosis not present

## 2016-08-25 DIAGNOSIS — E1159 Type 2 diabetes mellitus with other circulatory complications: Secondary | ICD-10-CM

## 2016-08-25 DIAGNOSIS — E119 Type 2 diabetes mellitus without complications: Secondary | ICD-10-CM

## 2016-08-25 DIAGNOSIS — E349 Endocrine disorder, unspecified: Secondary | ICD-10-CM

## 2016-08-25 MED ORDER — GLUCOSE BLOOD VI STRP: ORAL_STRIP | 12 refills | Status: DC

## 2016-08-25 MED ORDER — METOPROLOL SUCCINATE ER 100 MG PO TB24: 100.0000 mg | ORAL_TABLET | Freq: Every day | ORAL | 0 refills | Status: DC

## 2016-08-25 MED ORDER — METFORMIN HCL 500 MG PO TABS: 500.0000 mg | ORAL_TABLET | Freq: Two times a day (BID) | ORAL | 0 refills | Status: DC

## 2016-08-25 MED ORDER — LISINOPRIL-HYDROCHLOROTHIAZIDE 20-12.5 MG PO TABS: 2.0000 | ORAL_TABLET | Freq: Every day | ORAL | 0 refills | Status: DC

## 2016-08-25 MED ORDER — SERTRALINE HCL 100 MG PO TABS
100.0000 mg | ORAL_TABLET | Freq: Every day | ORAL | 0 refills | Status: DC
Start: 2016-08-25 — End: 2016-10-04

## 2016-08-25 MED ORDER — BLOOD PRESSURE KIT: PACK | 0 refills | Status: DC

## 2016-08-25 MED ORDER — FENOFIBRATE 160 MG PO TABS: 160.0000 mg | ORAL_TABLET | Freq: Every day | ORAL | 0 refills | Status: DC

## 2016-08-25 MED ORDER — ATORVASTATIN CALCIUM 40 MG PO TABS: 40.0000 mg | ORAL_TABLET | Freq: Every day | ORAL | 0 refills | Status: DC

## 2016-08-25 MED ORDER — METFORMIN HCL 500 MG PO TABS: 500.0000 mg | ORAL_TABLET | Freq: Two times a day (BID) | ORAL | 2 refills | Status: DC

## 2016-08-25 NOTE — Patient Instructions (Signed)
Per Dr. Carollee Herter patient to stop Lisinopril 20-12.5mg , continue all other medications as ordered. Return for BP check in 1 week.

## 2016-08-25 NOTE — Progress Notes (Signed)
Pre visit review using our clinic tool,if applicable. No additional management support is needed unless otherwise documented below in the visit note.   Patient in for BP check per order from Dr. Lawson Radar dated 08/11/16.  Patient currently taking Metoprolol 100 mg and Lisinopril 20-12.5 mg   Patient states she has had some periods of dizziness. BP today = 99/60 P = 69  Per Dr. Carollee Herter patient to stop Lisinopril 20-12.5mg , continue all other medications as ordered. Return for BP check in 1 week.   Patient requests BP machine be ordered from pharmacy. Order placed.  Patient requested another demonstration on how to use her glucometer. Instructions given and patient gave return demonstration with out difficulty.

## 2016-08-25 NOTE — Progress Notes (Signed)
Brix Brearley R Lowne Chase, DO 

## 2016-08-26 ENCOUNTER — Other Ambulatory Visit: Payer: Self-pay | Admitting: Family Medicine

## 2016-08-26 MED ORDER — GLUCOSE BLOOD VI STRP: ORAL_STRIP | 11 refills | Status: DC

## 2016-08-31 ENCOUNTER — Encounter: Payer: Self-pay | Admitting: Family Medicine

## 2016-08-31 NOTE — Telephone Encounter (Signed)
error:[id number] ° °

## 2016-09-01 ENCOUNTER — Other Ambulatory Visit: Payer: Self-pay

## 2016-09-01 ENCOUNTER — Ambulatory Visit (INDEPENDENT_AMBULATORY_CARE_PROVIDER_SITE_OTHER): Payer: BLUE CROSS/BLUE SHIELD | Admitting: Family Medicine

## 2016-09-01 VITALS — BP 118/80 | HR 67 | Wt 213.6 lb

## 2016-09-01 DIAGNOSIS — I152 Hypertension secondary to endocrine disorders: Secondary | ICD-10-CM

## 2016-09-01 DIAGNOSIS — E1159 Type 2 diabetes mellitus with other circulatory complications: Secondary | ICD-10-CM

## 2016-09-01 DIAGNOSIS — I10 Essential (primary) hypertension: Principal | ICD-10-CM

## 2016-09-01 MED ORDER — BLOOD PRESSURE KIT: PACK | 0 refills | Status: DC

## 2016-09-01 NOTE — Progress Notes (Signed)
Noted -- agree Yvonne R Lowne Chase, DO  

## 2016-09-01 NOTE — Progress Notes (Signed)
Pre visit review using our clinic tool,if applicable. No additional management support is needed unless otherwise documented below in the visit note.   Patient in for BP check. States she has taken all her medications today/  BP today = 118/80   P=60   Per Dr. Lawson Radar patient  to continue  Medications as ordered and return in 2 months for  CPE which has been scheduled. Patient agreed.  Order for BP cuff sent to [place] per patient request.

## 2016-09-22 ENCOUNTER — Encounter: Payer: Self-pay | Admitting: Gastroenterology

## 2016-10-04 ENCOUNTER — Other Ambulatory Visit: Payer: Self-pay | Admitting: Family Medicine

## 2016-10-04 DIAGNOSIS — I10 Essential (primary) hypertension: Secondary | ICD-10-CM

## 2016-10-04 NOTE — Telephone Encounter (Signed)
rx sent   pc

## 2016-10-04 NOTE — Telephone Encounter (Signed)
Requesting:Zoloft Contract:No UDSNo Last OV:09/01/16 Last Refill: 08/25/16 #[id number]  Please Advise

## 2016-10-10 ENCOUNTER — Ambulatory Visit: Payer: Self-pay | Admitting: Gastroenterology

## 2016-10-13 ENCOUNTER — Encounter: Payer: Self-pay | Admitting: Family Medicine

## 2016-10-16 ENCOUNTER — Other Ambulatory Visit: Payer: Self-pay | Admitting: Family Medicine

## 2016-10-16 DIAGNOSIS — I10 Essential (primary) hypertension: Secondary | ICD-10-CM

## 2016-11-10 ENCOUNTER — Ambulatory Visit: Payer: Self-pay | Admitting: Gastroenterology

## 2017-02-18 ENCOUNTER — Other Ambulatory Visit: Payer: Self-pay | Admitting: Family Medicine

## 2017-02-24 ENCOUNTER — Telehealth: Payer: Self-pay | Admitting: Family Medicine

## 2017-02-24 ENCOUNTER — Other Ambulatory Visit: Payer: Self-pay | Admitting: Family Medicine

## 2017-02-24 NOTE — Telephone Encounter (Signed)
Copied from [place] [id number]. Topic: Quick Communication - Rx Refill/Question >> Feb 24, 2017  5:10 PM Marin Olp L wrote: Has the patient contacted their pharmacy? Yes.   (Agent: If no, request that the patient contact the pharmacy for the refill.) Preferred Pharmacy (with phone number or street name): [facility] [address], [place] [address] Agent: Please be advised that RX refills may take up to 3 business days. We ask that you follow-up with your pharmacy. Refill fenofibrate 160 MG tablet and atorvastatin (LIPITOR) 40 MG tablet

## 2017-02-27 ENCOUNTER — Other Ambulatory Visit: Payer: Self-pay

## 2017-02-27 MED ORDER — ATORVASTATIN CALCIUM 40 MG PO TABS: 40.0000 mg | ORAL_TABLET | Freq: Every day | ORAL | 1 refills | Status: DC

## 2017-03-15 ENCOUNTER — Other Ambulatory Visit: Payer: Self-pay | Admitting: Family Medicine

## 2017-03-15 DIAGNOSIS — I10 Essential (primary) hypertension: Secondary | ICD-10-CM

## 2017-03-25 ENCOUNTER — Other Ambulatory Visit: Payer: Self-pay | Admitting: Family Medicine

## 2017-03-25 DIAGNOSIS — I10 Essential (primary) hypertension: Secondary | ICD-10-CM

## 2017-04-15 ENCOUNTER — Other Ambulatory Visit: Payer: Self-pay | Admitting: Family Medicine

## 2017-06-13 ENCOUNTER — Other Ambulatory Visit: Payer: Self-pay | Admitting: Family Medicine

## 2017-07-05 ENCOUNTER — Other Ambulatory Visit: Payer: Self-pay | Admitting: Family Medicine

## 2017-07-22 ENCOUNTER — Other Ambulatory Visit: Payer: Self-pay | Admitting: Family Medicine

## 2017-08-08 ENCOUNTER — Other Ambulatory Visit: Payer: Self-pay | Admitting: Family Medicine

## 2017-08-14 ENCOUNTER — Other Ambulatory Visit: Payer: Self-pay | Admitting: Family Medicine

## 2017-09-07 ENCOUNTER — Other Ambulatory Visit: Payer: Self-pay | Admitting: Family Medicine

## 2017-09-08 ENCOUNTER — Other Ambulatory Visit: Payer: Self-pay | Admitting: Family Medicine

## 2017-09-18 ENCOUNTER — Other Ambulatory Visit: Payer: Self-pay | Admitting: Family Medicine

## 2017-09-18 NOTE — Telephone Encounter (Signed)
Pt states she is out of her Metformin and starting to feel side effects.

## 2017-09-18 NOTE — Telephone Encounter (Signed)
Author phoned pt re: metformin request in light of LOV: 09/01/16, with two cancellations and one no show. No answer, author left VM to call back to make an appointment with Dr. Etter Sjogren, [phone].

## 2017-09-18 NOTE — Telephone Encounter (Signed)
Pt wants Dr Etter Sjogren to know she is out of this med,  metFORMIN (GLUCOPHAGE) 500 MG tablet  soonest appt is Friday, and pt is concerned about not having this med for 5 days.  If you do send this Rx in, pt will need a 30 day.  Pt states she can come any other time if you want to work her in sooner.  [facility] [address] [place] [phone] (Phone) [phone] (Fax)

## 2017-09-22 ENCOUNTER — Ambulatory Visit: Payer: Self-pay | Admitting: Family Medicine

## 2017-10-02 ENCOUNTER — Encounter: Payer: Self-pay | Admitting: Family Medicine

## 2017-10-02 ENCOUNTER — Ambulatory Visit: Payer: Self-pay | Admitting: Family Medicine

## 2017-10-02 ENCOUNTER — Other Ambulatory Visit: Payer: Self-pay | Admitting: Family Medicine

## 2017-10-02 VITALS — BP 163/104 | HR 60 | Temp 98.5°F | Ht 71.0 in | Wt 190.4 lb

## 2017-10-02 DIAGNOSIS — F321 Major depressive disorder, single episode, moderate: Secondary | ICD-10-CM

## 2017-10-02 DIAGNOSIS — E785 Hyperlipidemia, unspecified: Secondary | ICD-10-CM

## 2017-10-02 DIAGNOSIS — I10 Essential (primary) hypertension: Secondary | ICD-10-CM

## 2017-10-02 DIAGNOSIS — E1165 Type 2 diabetes mellitus with hyperglycemia: Secondary | ICD-10-CM

## 2017-10-02 DIAGNOSIS — E119 Type 2 diabetes mellitus without complications: Secondary | ICD-10-CM

## 2017-10-02 DIAGNOSIS — R400 Somnolence: Secondary | ICD-10-CM | POA: Insufficient documentation

## 2017-10-02 DIAGNOSIS — E1169 Type 2 diabetes mellitus with other specified complication: Secondary | ICD-10-CM

## 2017-10-02 DIAGNOSIS — M21611 Bunion of right foot: Secondary | ICD-10-CM

## 2017-10-02 DIAGNOSIS — L84 Corns and callosities: Secondary | ICD-10-CM

## 2017-10-02 DIAGNOSIS — F320 Major depressive disorder, single episode, mild: Secondary | ICD-10-CM

## 2017-10-02 LAB — COMPREHENSIVE METABOLIC PANEL
ALT: 20 U/L (ref 0–35)
AST: 20 U/L (ref 0–37)
Albumin: 4.1 g/dL (ref 3.5–5.2)
Alkaline Phosphatase: 56 U/L (ref 39–117)
BUN: 27 mg/dL — ABNORMAL HIGH (ref 6–23)
CO2: 27 mEq/L (ref 19–32)
Calcium: 12.7 mg/dL — ABNORMAL HIGH (ref 8.4–10.5)
Chloride: 108 mEq/L (ref 96–112)
Creatinine, Ser: 1.45 mg/dL — ABNORMAL HIGH (ref 0.40–1.20)
GFR: 38.86 mL/min — ABNORMAL LOW (ref >60.00)
Glucose, Bld: 82 mg/dL (ref 70–99)
Potassium: 4.3 mEq/L (ref 3.5–5.1)
Sodium: 139 mEq/L (ref 135–145)
Total Bilirubin: 0.4 mg/dL (ref 0.2–1.2)
Total Protein: 6.9 g/dL (ref 6.0–8.3)

## 2017-10-02 LAB — LIPID PANEL
Cholesterol: 153 mg/dL (ref 0–200)
HDL: 68.7 mg/dL (ref >39.00)
LDL Cholesterol: 71 mg/dL (ref 0–99)
NonHDL: 84.09
Total CHOL/HDL Ratio: 2
Triglycerides: 64 mg/dL (ref 0.0–149.0)
VLDL: 12.8 mg/dL (ref 0.0–40.0)

## 2017-10-02 LAB — HEMOGLOBIN A1C: Hgb A1c MFr Bld: 6.1 % (ref 4.6–6.5)

## 2017-10-02 MED ORDER — ONETOUCH ULTRASOFT LANCETS MISC: 12 refills | Status: AC

## 2017-10-02 MED ORDER — LISINOPRIL-HYDROCHLOROTHIAZIDE 20-12.5 MG PO TABS: 2.0000 | ORAL_TABLET | Freq: Every day | ORAL | 1 refills | Status: DC

## 2017-10-02 MED ORDER — SERTRALINE HCL 100 MG PO TABS: 100.0000 mg | ORAL_TABLET | Freq: Every day | ORAL | 1 refills | Status: DC

## 2017-10-02 NOTE — Progress Notes (Signed)
Check labs.  Adjust meds prnCheck labs.  Adjust meds prn Patient ID: Deborah Figueroa, female    DOB: 18-Jul-1955  Age: 62 y.o. MRN: [id number]    Subjective:  Subjective  HPI Deborah Figueroa presents for f/u dm, htn, cholesterol.     She c/o sleepiness and falling asleep during the day.  She does snore and she has 2 other family members with sleep apnea.    She recently lost a job because of it.   She also needs some refills---  And a referral to podiatry due to bunion and severe corn/ callous.    HYPERTENSION   Blood pressure range-not checking   Chest pain- no      Dyspnea- no Lightheadedness- no   Edema- no  Other side effects - no   Medication compliance: poor-- pt stopped lisinopril -- she does not know why Low salt diet- yes    DIABETES  Blood Sugar ranges  --  Not checking  Polyuria- no New Visual problems- no  Hypoglycemic symptoms- no  Other side effects-no Medication compliance - good Last eye exam- due Foot exam- today   HYPERLIPIDEMIA  Medication compliance- good RUQ pain- no  Muscle aches- no Other side effects-no    Review of Systems  Constitutional: Negative for chills and fever.  HENT: Negative for congestion and hearing loss.   Eyes: Negative for discharge.  Respiratory: Negative for cough and shortness of breath.   Cardiovascular: Negative for chest pain, palpitations and leg swelling.  Gastrointestinal: Negative for abdominal pain, blood in stool, constipation, diarrhea, nausea and vomiting.  Genitourinary: Negative for dysuria, frequency, hematuria and urgency.  Musculoskeletal: Negative for back pain and myalgias.  Skin: Negative for rash.  Allergic/Immunologic: Negative for environmental allergies.  Neurological: Positive for light-headedness. Negative for dizziness, weakness and headaches.  Hematological: Does not bruise/bleed easily.  Psychiatric/Behavioral: Negative for suicidal ideas. The patient is not nervous/anxious.     History Past  Medical History:  Diagnosis Date  . Cellulitis   . Depression   . Hyperlipidemia   . Hypertension   . Seasonal allergies     She has a past surgical history that includes Lumbar fusion; Breast surgery; and Back surgery.   Her family history includes Cancer in her maternal aunt and maternal grandmother; Cancer (age of onset: 99) in her father; Depression in her father; Diabetes in her brother; Hyperlipidemia in her mother; Hypertension in her mother; Stroke in her mother.She reports that she has never smoked. She has never used smokeless tobacco. She reports that she does not drink alcohol or use drugs.  Current Outpatient Medications on File Prior to Visit  Medication Sig Dispense Refill  . atorvastatin (LIPITOR) 40 MG tablet Take 1 tablet (40 mg total) by mouth daily. 90 tablet 1  . atorvastatin (LIPITOR) 40 MG tablet TAKE ONE TABLET BY MOUTH DAILY 90 tablet 0  . Blood Pressure KIT Per Dr. Etter Sjogren- Chase Patient to have Blood Pressure Kit for home monitoring 1 each 0  . fenofibrate 160 MG tablet TAKE ONE TABLET BY MOUTH DAILY 30 tablet 0  . glucose blood (ONETOUCH VERIO) test strip Use once daily to check blood sugar.  DX E11.9 100 each 11  . metFORMIN (GLUCOPHAGE) 500 MG tablet TAKE ONE TABLET BY MOUTH TWICE A DAY WITH A MEAL 30 tablet 0  . metoprolol succinate (TOPROL-XL) 100 MG 24 hr tablet TAKE 1 TABLET BY MOUTH  DAILY WITH OR IMMEDIATLEY  FOLLOWING A MEAL 90 tablet 0  .  metoprolol succinate (TOPROL-XL) 100 MG 24 hr tablet TAKE ONE TABLET BY MOUTH DAILY WITH OR IMMEDIATELY FOLLOWING A MEAL 30 tablet 1  . metoprolol succinate (TOPROL-XL) 100 MG 24 hr tablet TAKE ONE TABLET BY MOUTH DAILY WITH OR IMMEDIATELY FOLLOWING A MEAL 30 tablet 1  . metoprolol succinate (TOPROL-XL) 100 MG 24 hr tablet TAKE ONE TABLET BY MOUTH DAILY WITH OR IMMEDIATELY FOLLOWING A MEAL 30 tablet 0  . SANTYL ointment     . terconazole (TERAZOL 7) 0.4 % vaginal cream Place 1 applicator vaginally at bedtime. 45 g 0  .  [DISCONTINUED] albuterol (PROAIR HFA) 108 (90 BASE) MCG/ACT inhaler Inhale 2 puffs into the lungs every 6 (six) hours as needed for wheezing or shortness of breath. (Patient not taking: Reported on 08/06/2015) 1 Inhaler 3   No current facility-administered medications on file prior to visit.      Objective:  Objective  Physical Exam  Constitutional: She is oriented to person, place, and time. She appears well-developed and well-nourished.  HENT:  Head: Normocephalic and atraumatic.  Eyes: Conjunctivae and EOM are normal.  Neck: Normal range of motion. Neck supple. No JVD present. Carotid bruit is not present. No thyromegaly present.  Cardiovascular: Normal rate, regular rhythm and normal heart sounds.  No murmur heard. Pulmonary/Chest: Effort normal and breath sounds normal. No respiratory distress. She has no wheezes. She has no rales. She exhibits no tenderness.  Musculoskeletal: She exhibits no edema.       Left foot: There is deformity.  Neurological: She is alert and oriented to person, place, and time.  Psychiatric: She has a normal mood and affect.  Nursing note and vitals reviewed.  Diabetic Foot Exam - Simple   Simple Foot Form Diabetic Foot exam was performed with the following findings:  Yes 10/02/2017 12:15 PM  Visual Inspection No deformities, no ulcerations, no other skin breakdown bilaterally:  Yes Sensation Testing Intact to touch and monofilament testing bilaterally:  Yes Pulse Check Posterior Tibialis and Dorsalis pulse intact bilaterally:  Yes Comments      BP (!) 163/104 (BP Location: Left Arm, Patient Position: Sitting, Cuff Size: Large)   Pulse 60   Temp 98.5 F (36.9 C) (Oral)   Ht _0  (1.803 m)   Wt 190 lb 6.4 oz (86.4 kg)   SpO2 99%   BMI 26.56 kg/m  Wt Readings from Last 3 Encounters:  10/02/17 190 lb 6.4 oz (86.4 kg)  09/01/16 213 lb 9.6 oz (96.9 kg)  08/25/16 213 lb 12.8 oz (97 kg)     Lab Results  Component Value Date   WBC 5.0  08/27/2015   HGB 11.7 (L) 08/27/2015   HCT 35.0 (L) 08/27/2015   PLT 191 08/27/2015   GLUCOSE 162 (H) 08/11/2016   CHOL 241 (H) 08/11/2016   TRIG 194.0 (H) 08/11/2016   HDL 62.20 08/11/2016   LDLDIRECT 82.0 11/21/2014   LDLCALC 140 (H) 08/11/2016   ALT 16 08/11/2016   AST 13 08/11/2016   NA 134 (L) 08/11/2016   K 3.8 08/11/2016   CL 104 08/11/2016   CREATININE 1.23 (H) 08/11/2016   BUN 34 (H) 08/11/2016   CO2 25 08/11/2016   TSH 0.56 06/09/2011   HGBA1C 8.3 (H) 08/23/2016    No results found.   Assessment & Plan:  Plan  I have changed Sina B. Ryback's sertraline and lisinopril-hydrochlorothiazide. I am also having her start on onetouch ultrasoft. Additionally, I am having her maintain her SANTYL, terconazole, glucose blood, Blood  Pressure, metoprolol succinate, atorvastatin, metoprolol succinate, metoprolol succinate, fenofibrate, atorvastatin, metoprolol succinate, and metFORMIN.  Meds ordered this encounter  Medications  . sertraline (ZOLOFT) 100 MG tablet    Sig: Take 1 tablet (100 mg total) by mouth daily.    Dispense:  30 tablet    Refill:  1  . Lancets (ONETOUCH ULTRASOFT) lancets    Sig: Use as instructed    Dispense:  100 each    Refill:  12  . lisinopril-hydrochlorothiazide (PRINZIDE,ZESTORETIC) 20-12.5 MG tablet    Sig: Take 2 tablets by mouth daily.    Dispense:  180 tablet    Refill:  1    Problem List Items Addressed This Visit      Unprioritized   Daytime somnolence    ? Sleep apnea-- refer to neuro       Relevant Orders   Ambulatory referral to Neurology   Depression, major, single episode, moderate (HCC)    Stable con't zoloft      Relevant Medications   sertraline (ZOLOFT) 100 MG tablet   Diabetes mellitus type II, uncontrolled ([place])    Check labs  hgba1c to be checked , minimize simple carbs. Increase exercise as tolerated. Continue current meds       Relevant Medications   lisinopril-hydrochlorothiazide (PRINZIDE,ZESTORETIC)  20-12.5 MG tablet   Essential hypertension    Poorly controlled will alter medications, encouraged DASH diet, minimize caffeine and obtain adequate sleep. Report concerning symptoms and follow up as directed and as needed      Relevant Medications   lisinopril-hydrochlorothiazide (PRINZIDE,ZESTORETIC) 20-12.5 MG tablet   Other Relevant Orders   Lipid panel   Hemoglobin A1c   Comprehensive metabolic panel   Hyperlipidemia    Encouraged heart healthy diet, increase exercise, avoid trans fats, consider a krill oil cap daily      Relevant Medications   lisinopril-hydrochlorothiazide (PRINZIDE,ZESTORETIC) 20-12.5 MG tablet    Other Visit Diagnoses    Depression, major, single episode, mild ([place])    -  Primary   Relevant Medications   sertraline (ZOLOFT) 100 MG tablet   Type 2 diabetes mellitus without complication, unspecified whether long term insulin use (HCC)       Relevant Medications   Lancets (ONETOUCH ULTRASOFT) lancets   lisinopril-hydrochlorothiazide (PRINZIDE,ZESTORETIC) 20-12.5 MG tablet   Other Relevant Orders   Lipid panel   Hemoglobin A1c   Comprehensive metabolic panel   Corn of toe       Relevant Orders   Ambulatory referral to Podiatry   Bunion of great toe of right foot       Relevant Orders   Ambulatory referral to Podiatry   Hyperlipidemia associated with type 2 diabetes mellitus ([place])       Relevant Medications   lisinopril-hydrochlorothiazide (PRINZIDE,ZESTORETIC) 20-12.5 MG tablet   Other Relevant Orders   Lipid panel   Comprehensive metabolic panel      Follow-up: Return in about 2 weeks (around 10/16/2017) for hypertension, hyperlipidemia, diabetes II.  Ann Held, DO

## 2017-10-02 NOTE — Patient Instructions (Signed)

## 2017-10-02 NOTE — Assessment & Plan Note (Signed)
Stable con't zoloft 

## 2017-10-02 NOTE — Assessment & Plan Note (Signed)
Encouraged heart healthy diet, increase exercise, avoid trans fats, consider a krill oil cap daily 

## 2017-10-02 NOTE — Assessment & Plan Note (Signed)
Check labs  hgba1c to be checked, minimize simple carbs. Increase exercise as tolerated. Continue current meds  

## 2017-10-02 NOTE — Assessment & Plan Note (Signed)
?   Sleep apnea-- refer to neuro

## 2017-10-02 NOTE — Assessment & Plan Note (Signed)
Poorly controlled will alter medications, encouraged DASH diet, minimize caffeine and obtain adequate sleep. Report concerning symptoms and follow up as directed and as needed 

## 2017-10-03 ENCOUNTER — Other Ambulatory Visit: Payer: Self-pay | Admitting: Family Medicine

## 2017-10-06 ENCOUNTER — Telehealth: Payer: Self-pay | Admitting: Family Medicine

## 2017-10-06 NOTE — Telephone Encounter (Signed)
Copied from [place] [id number]. Topic: General - Other >> Oct 06, 2017 11:39 AM Keene Breath wrote: Reason for CRM: Patient is returning call to [facility] regarding lab results.  CB#[phone]

## 2017-10-09 ENCOUNTER — Telehealth: Payer: Self-pay

## 2017-10-09 ENCOUNTER — Other Ambulatory Visit: Payer: Self-pay | Admitting: Family Medicine

## 2017-10-09 NOTE — Telephone Encounter (Signed)
Copied from [place] [phone]. Topic: General - Other >> Oct 06, 2017 11:39 AM Keene Breath wrote: Reason for CRM: Patient is returning call to [facility] regarding lab results.  RK#[phone]  >> Oct 09, 2017 10:58 AM Keene Breath wrote: Patient is calling to get lab results.  Please call back at [phone].

## 2017-10-09 NOTE — Telephone Encounter (Signed)
Called patient with results of A1c.

## 2017-10-09 NOTE — Telephone Encounter (Signed)
Called patient with lab results. States she will increase her water intake and return in 2 weeks for repeat labs.

## 2017-10-09 NOTE — Telephone Encounter (Signed)
Patient called, left VM to return call for lab results. Unable to document in result notes due to result notes not routed to [facility].

## 2017-10-09 NOTE — Telephone Encounter (Signed)
Pt would like to know specific results for her a1c test.  Please call [phone]

## 2017-10-14 ENCOUNTER — Other Ambulatory Visit: Payer: Self-pay | Admitting: Family Medicine

## 2017-10-16 ENCOUNTER — Other Ambulatory Visit: Payer: Self-pay

## 2017-10-16 ENCOUNTER — Telehealth: Payer: Self-pay | Admitting: Family Medicine

## 2017-10-16 MED ORDER — METFORMIN HCL 500 MG PO TABS: ORAL_TABLET | ORAL | 2 refills | Status: DC

## 2017-10-16 NOTE — Telephone Encounter (Signed)
Refill sent to pt's pharmacy. 

## 2017-10-16 NOTE — Telephone Encounter (Signed)
Copied from [place] [id number]. Topic: Quick Communication - Rx Refill/Question >> Oct 16, 2017  1:31 PM Tye [place] wrote: metFORMIN (GLUCOPHAGE) 500 MG tablet [[id number]]   Pt states she needs to have 60 tablets due to the fact that she takes the medication 2x a day; contact pt to advise or pharmacy   Ph[facility]acy: [facility]

## 2017-10-17 ENCOUNTER — Ambulatory Visit: Payer: Self-pay

## 2017-10-20 ENCOUNTER — Ambulatory Visit: Payer: Self-pay

## 2017-10-27 ENCOUNTER — Ambulatory Visit: Payer: Self-pay

## 2017-11-15 ENCOUNTER — Other Ambulatory Visit: Payer: Self-pay | Admitting: Family Medicine

## 2017-11-27 ENCOUNTER — Other Ambulatory Visit: Payer: Self-pay | Admitting: Family Medicine

## 2017-11-27 NOTE — Telephone Encounter (Signed)
Received call from the [facility] that pt was on the line stating she is without insurance and currently out of metoprolol and needs refill now. Pt is currently at the pharmacy. Pt last seen 10/02/17 and was advised to f/u in 2 weeks for BP recheck. Pt cancelled appt and has now r/s for 01/02/18. Pt refuses to come in earlier stating, "I don't have insurance and the clinic knows my situation". Attempted to place call on hold for approval to send Rx now and call disconnected. Was given verbal to give refill and keep appt on 01/02/18 and make sure pt knows to keep that appt. Refill sent. Attempted to contact pt; received ring tone then disconnected before being answered x 3.

## 2017-12-08 ENCOUNTER — Other Ambulatory Visit: Payer: Self-pay | Admitting: Family Medicine

## 2018-01-02 ENCOUNTER — Ambulatory Visit: Payer: Self-pay | Admitting: Family Medicine

## 2018-01-12 ENCOUNTER — Ambulatory Visit (INDEPENDENT_AMBULATORY_CARE_PROVIDER_SITE_OTHER): Payer: Self-pay | Admitting: Family Medicine

## 2018-01-12 ENCOUNTER — Encounter: Payer: Self-pay | Admitting: Family Medicine

## 2018-01-12 VITALS — BP 122/84 | HR 68 | Temp 98.3°F | Resp 16 | Ht 71.0 in | Wt 187.6 lb

## 2018-01-12 DIAGNOSIS — E785 Hyperlipidemia, unspecified: Secondary | ICD-10-CM

## 2018-01-12 DIAGNOSIS — I10 Essential (primary) hypertension: Secondary | ICD-10-CM

## 2018-01-12 DIAGNOSIS — F320 Major depressive disorder, single episode, mild: Secondary | ICD-10-CM

## 2018-01-12 DIAGNOSIS — IMO0002 Reserved for concepts with insufficient information to code with codable children: Secondary | ICD-10-CM

## 2018-01-12 DIAGNOSIS — E1165 Type 2 diabetes mellitus with hyperglycemia: Secondary | ICD-10-CM

## 2018-01-12 DIAGNOSIS — F321 Major depressive disorder, single episode, moderate: Secondary | ICD-10-CM

## 2018-01-12 DIAGNOSIS — E1151 Type 2 diabetes mellitus with diabetic peripheral angiopathy without gangrene: Secondary | ICD-10-CM

## 2018-01-12 MED ORDER — ATORVASTATIN CALCIUM 40 MG PO TABS: 40.0000 mg | ORAL_TABLET | Freq: Every day | ORAL | 0 refills | Status: DC

## 2018-01-12 MED ORDER — SERTRALINE HCL 100 MG PO TABS: 100.0000 mg | ORAL_TABLET | Freq: Every day | ORAL | 1 refills | Status: DC

## 2018-01-12 NOTE — Assessment & Plan Note (Signed)
Well controlled, no changes to meds. Encouraged heart healthy diet such as the DASH diet and exercise as tolerated.  °

## 2018-01-12 NOTE — Assessment & Plan Note (Signed)
Stable con't zoloft 

## 2018-01-12 NOTE — Progress Notes (Signed)
Patient ID: Deborah Figueroa, female    DOB: Aug 25, 1955  Age: 62 y.o. MRN: [id number]    Subjective:  Subjective  HPI Deborah Figueroa presents for f/u dm, chol and bp.  She has not been to the podiatrist yet.    Review of Systems  Constitutional: Negative for appetite change, diaphoresis, fatigue and unexpected weight change.  Eyes: Negative for pain, redness and visual disturbance.  Respiratory: Negative for cough, chest tightness, shortness of breath and wheezing.   Cardiovascular: Negative for chest pain, palpitations and leg swelling.  Endocrine: Negative for cold intolerance, heat intolerance, polydipsia, polyphagia and polyuria.  Genitourinary: Negative for difficulty urinating, dysuria and frequency.  Neurological: Negative for dizziness, light-headedness, numbness and headaches.    History Past Medical History:  Diagnosis Date  . Cellulitis   . Depression   . Hyperlipidemia   . Hypertension   . Seasonal allergies     She has a past surgical history that includes Lumbar fusion; Breast surgery; and Back surgery.   Her family history includes Cancer in her maternal aunt and maternal grandmother; Cancer (age of onset: 18) in her father; Depression in her father; Diabetes in her brother; Hyperlipidemia in her mother; Hypertension in her mother; Stroke in her mother.She reports that she has never smoked. She has never used smokeless tobacco. She reports that she does not drink alcohol or use drugs.  Current Outpatient Medications on File Prior to Visit  Medication Sig Dispense Refill  . fenofibrate 160 MG tablet Take 1 tablet (160 mg total) by mouth daily. 90 tablet 0  . Lancets (ONETOUCH ULTRASOFT) lancets Use as instructed 100 each 12  . lisinopril-hydrochlorothiazide (PRINZIDE,ZESTORETIC) 20-12.5 MG tablet Take 2 tablets by mouth daily. 180 tablet 1  . metFORMIN (GLUCOPHAGE) 500 MG tablet TAKE ONE TABLET BY MOUTH TWICE A DAY WITH A MEAL 60 tablet 2  . metoprolol succinate  (TOPROL-XL) 100 MG 24 hr tablet TAKE ONE TABLET BY MOUTH DAILY (WITH OR IMMEDIATLEY FOLLOWING A MEAL) 30 tablet 1  . ONETOUCH VERIO test strip USE AS DIRECTED TO CHECK BLOOD SUGAR ONCE DAILY 100 each 10  . [DISCONTINUED] albuterol (PROAIR HFA) 108 (90 BASE) MCG/ACT inhaler Inhale 2 puffs into the lungs every 6 (six) hours as needed for wheezing or shortness of breath. (Patient not taking: Reported on 08/06/2015) 1 Inhaler 3   No current facility-administered medications on file prior to visit.      Objective:  Objective  Physical Exam  Constitutional: She is oriented to person, place, and time. She appears well-developed and well-nourished.  HENT:  Head: Normocephalic and atraumatic.  Eyes: Conjunctivae and EOM are normal.  Neck: Normal range of motion. Neck supple. No JVD present. Carotid bruit is not present. No thyromegaly present.  Cardiovascular: Normal rate, regular rhythm and normal heart sounds.  No murmur heard. Pulmonary/Chest: Effort normal and breath sounds normal. No respiratory distress. She has no wheezes. She has no rales. She exhibits no tenderness.  Musculoskeletal: She exhibits no edema.  Neurological: She is alert and oriented to person, place, and time.  Psychiatric: She has a normal mood and affect.  Nursing note and vitals reviewed.  Diabetic Foot Exam - Simple   Simple Foot Form Diabetic Foot exam was performed with the following findings:  Yes 01/12/2018  2:31 PM  Visual Inspection No deformities, no ulcerations, no other skin breakdown bilaterally:  Yes Sensation Testing Intact to touch and monofilament testing bilaterally:  Yes Pulse Check Posterior Tibialis and Dorsalis pulse intact bilaterally:  Yes Comments     BP 122/84 (BP Location: Right Arm, Patient Position: Sitting, Cuff Size: Normal)   Pulse 68   Temp 98.3 F (36.8 C) (Oral)   Resp 16   Ht 5\' 11"  (1.803 m)   Wt 187 lb 9.6 oz (85.1 kg)   SpO2 98%   BMI 26.16 kg/m  Wt Readings from Last 3  Encounters:  01/12/18 187 lb 9.6 oz (85.1 kg)  10/02/17 190 lb 6.4 oz (86.4 kg)  09/01/16 213 lb 9.6 oz (96.9 kg)     Lab Results  Component Value Date   WBC 5.0 08/27/2015   HGB 11.7 (L) 08/27/2015   HCT 35.0 (L) 08/27/2015   PLT 191 08/27/2015   GLUCOSE 157 (H) 01/12/2018   CHOL 167 01/12/2018   TRIG 102 01/12/2018   HDL 70 01/12/2018   LDLDIRECT 82.0 11/21/2014   LDLCALC 78 01/12/2018   ALT 9 01/12/2018   AST 12 01/12/2018   NA 139 01/12/2018   K 3.5 01/12/2018   CL 104 01/12/2018   CREATININE 2.09 (H) 01/12/2018   BUN 43 (H) 01/12/2018   CO2 26 01/12/2018   TSH 0.56 06/09/2011   HGBA1C 6.0 (H) 01/12/2018    No results found.   Assessment & Plan:  Plan  I have discontinued Sherley B. Toback's SANTYL, terconazole, and Blood Pressure. I am also having her maintain her onetouch ultrasoft, lisinopril-hydrochlorothiazide, ONETOUCH VERIO, fenofibrate, metFORMIN, metoprolol succinate, atorvastatin, and sertraline.  Meds ordered this encounter  Medications  . atorvastatin (LIPITOR) 40 MG tablet    Sig: Take 1 tablet (40 mg total) by mouth daily.    Dispense:  90 tablet    Refill:  0  . sertraline (ZOLOFT) 100 MG tablet    Sig: Take 1 tablet (100 mg total) by mouth daily.    Dispense:  90 tablet    Refill:  1    Problem List Items Addressed This Visit      Unprioritized   Depression, major, single episode, moderate (HCC)    Stable con't zoloft      Relevant Medications   sertraline (ZOLOFT) 100 MG tablet   Diabetes mellitus type II, uncontrolled ([place])    hgba1c to be done, minimize simple carbs. Increase exercise as tolerated. Continue current meds       Relevant Medications   atorvastatin (LIPITOR) 40 MG tablet   DM (diabetes mellitus) type II uncontrolled, periph vascular disorder (HCC)   Relevant Medications   atorvastatin (LIPITOR) 40 MG tablet   Other Relevant Orders   Hemoglobin A1c (Completed)   Essential hypertension    Well controlled, no  changes to meds. Encouraged heart healthy diet such as the DASH diet and exercise as tolerated.       Relevant Medications   atorvastatin (LIPITOR) 40 MG tablet   Other Relevant Orders   Comprehensive metabolic panel (Completed)   Hyperlipidemia - Primary    Tolerating statin, encouraged heart healthy diet, avoid trans fats, minimize simple carbs and saturated fats. Increase exercise as tolerated      Relevant Medications   atorvastatin (LIPITOR) 40 MG tablet   Other Relevant Orders   Lipid panel (Completed)    Other Visit Diagnoses    Depression, major, single episode, mild ([place])       Relevant Medications   sertraline (ZOLOFT) 100 MG tablet      Follow-up: Return in about 6 months (around 07/13/2018), or if symptoms worsen or fail to improve, for annual exam, fasting.  Ann Held, DO

## 2018-01-12 NOTE — Patient Instructions (Signed)

## 2018-01-12 NOTE — Assessment & Plan Note (Signed)
hgba1c to be done, minimize simple carbs. Increase exercise as tolerated. Continue current meds  

## 2018-01-13 ENCOUNTER — Other Ambulatory Visit: Payer: Self-pay | Admitting: Family Medicine

## 2018-01-13 DIAGNOSIS — F320 Major depressive disorder, single episode, mild: Secondary | ICD-10-CM

## 2018-01-13 LAB — COMPREHENSIVE METABOLIC PANEL
AG Ratio: 1.6 (calc) (ref 1.0–2.5)
ALT: 9 U/L (ref 6–29)
AST: 12 U/L (ref 10–35)
Albumin: 4.1 g/dL (ref 3.6–5.1)
Alkaline phosphatase (APISO): 43 U/L (ref 33–130)
BUN/Creatinine Ratio: 21 (calc) (ref 6–22)
BUN: 43 mg/dL — ABNORMAL HIGH (ref 7–25)
CO2: 26 mmol/L (ref 20–32)
Calcium: 12.2 mg/dL — ABNORMAL HIGH (ref 8.6–10.4)
Chloride: 104 mmol/L (ref 98–110)
Creat: 2.09 mg/dL — ABNORMAL HIGH (ref 0.50–0.99)
Globulin: 2.5 g/dL (calc) (ref 1.9–3.7)
Glucose, Bld: 157 mg/dL — ABNORMAL HIGH (ref 65–99)
Potassium: 3.5 mmol/L (ref 3.5–5.3)
Sodium: 139 mmol/L (ref 135–146)
Total Bilirubin: 0.4 mg/dL (ref 0.2–1.2)
Total Protein: 6.6 g/dL (ref 6.1–8.1)

## 2018-01-13 LAB — LIPID PANEL
Cholesterol: 167 mg/dL (ref <200)
HDL: 70 mg/dL (ref >50)
LDL Cholesterol (Calc): 78 mg/dL (calc)
Non-HDL Cholesterol (Calc): 97 mg/dL (calc) (ref <130)
Total CHOL/HDL Ratio: 2.4 (calc) (ref <5.0)
Triglycerides: 102 mg/dL (ref <150)

## 2018-01-13 LAB — HEMOGLOBIN A1C
Hgb A1c MFr Bld: 6 % of total Hgb — ABNORMAL HIGH (ref <5.7)
Mean Plasma Glucose: 126 (calc)
eAG (mmol/L): 7 (calc)

## 2018-01-14 NOTE — Assessment & Plan Note (Signed)
Tolerating statin, encouraged heart healthy diet, avoid trans fats, minimize simple carbs and saturated fats. Increase exercise as tolerated 

## 2018-02-01 ENCOUNTER — Other Ambulatory Visit: Payer: Self-pay | Admitting: Family Medicine

## 2018-02-13 ENCOUNTER — Other Ambulatory Visit: Payer: Self-pay | Admitting: *Deleted

## 2018-02-13 MED ORDER — FENOFIBRATE 160 MG PO TABS: 160.0000 mg | ORAL_TABLET | Freq: Every day | ORAL | 1 refills | Status: DC

## 2018-03-12 ENCOUNTER — Other Ambulatory Visit: Payer: Self-pay | Admitting: Family Medicine

## 2018-03-13 ENCOUNTER — Telehealth: Payer: Self-pay | Admitting: Family Medicine

## 2018-03-13 DIAGNOSIS — E1151 Type 2 diabetes mellitus with diabetic peripheral angiopathy without gangrene: Secondary | ICD-10-CM

## 2018-03-13 DIAGNOSIS — N289 Disorder of kidney and ureter, unspecified: Secondary | ICD-10-CM

## 2018-03-13 DIAGNOSIS — I10 Essential (primary) hypertension: Secondary | ICD-10-CM

## 2018-03-13 DIAGNOSIS — E785 Hyperlipidemia, unspecified: Secondary | ICD-10-CM

## 2018-03-13 DIAGNOSIS — E1165 Type 2 diabetes mellitus with hyperglycemia: Secondary | ICD-10-CM

## 2018-03-13 DIAGNOSIS — IMO0002 Reserved for concepts with insufficient information to code with codable children: Secondary | ICD-10-CM

## 2018-03-13 NOTE — Telephone Encounter (Addendum)
Pt returned call for Nov 1 st labs.  Lab message given to her :     Pt needs to see endo for hyperparathyroidism and hypercalcemia Her kidney function has increased even more -- she needs a nephrology referral as well --- inc water intake and recheck 2 weeks-- bmp--- may be from metformin -- repeat first before changing  Dm controlled but hgba1c creeping up Repeat rest of labs in 3 months--- lipid, cmp, hgba1c.  Pt voiced understanding of results and recommendation. Pt stated she will have to call back regarding repeating of her labs and referrals. She will create a MyChart so she can communicate with her pcp.

## 2018-03-15 NOTE — Addendum Note (Signed)
Addended by: Kem Boroughs D on: 03/15/2018 11:19 AM   Modules accepted: Orders

## 2018-03-15 NOTE — Telephone Encounter (Signed)
Future orders place.  Just awaiting on call back from patient to place referrals.

## 2018-03-26 ENCOUNTER — Encounter (HOSPITAL_COMMUNITY): Payer: Self-pay | Admitting: Emergency Medicine

## 2018-03-26 ENCOUNTER — Ambulatory Visit (INDEPENDENT_AMBULATORY_CARE_PROVIDER_SITE_OTHER): Payer: Worker's Compensation

## 2018-03-26 ENCOUNTER — Ambulatory Visit (HOSPITAL_COMMUNITY)
Admission: EM | Admit: 2018-03-26 | Discharge: 2018-03-26 | Disposition: A | Discharge status: Home / Self Care | Payer: Worker's Compensation | Attending: Urgent Care | Admitting: Urgent Care

## 2018-03-26 DIAGNOSIS — E119 Type 2 diabetes mellitus without complications: Secondary | ICD-10-CM | POA: Insufficient documentation

## 2018-03-26 DIAGNOSIS — W010XXA Fall on same level from slipping, tripping and stumbling without subsequent striking against object, initial encounter: Secondary | ICD-10-CM

## 2018-03-26 DIAGNOSIS — S80212A Abrasion, left knee, initial encounter: Secondary | ICD-10-CM | POA: Diagnosis not present

## 2018-03-26 DIAGNOSIS — M1712 Unilateral primary osteoarthritis, left knee: Secondary | ICD-10-CM | POA: Diagnosis not present

## 2018-03-26 DIAGNOSIS — S8002XA Contusion of left knee, initial encounter: Secondary | ICD-10-CM | POA: Diagnosis not present

## 2018-03-26 DIAGNOSIS — M25562 Pain in left knee: Secondary | ICD-10-CM

## 2018-03-26 MED ORDER — PREDNISONE 10 MG PO TABS: ORAL_TABLET | ORAL | 0 refills | Status: DC

## 2018-03-26 NOTE — Discharge Instructions (Addendum)
You may take 500mg Tylenol every 6 hours for pain and inflammation. ° °

## 2018-03-26 NOTE — ED Notes (Signed)
Applied ace wrap to left knee ?

## 2018-03-26 NOTE — ED Provider Notes (Addendum)
MRN: [id number] DOB: [birth year]  Subjective:   Deborah Figueroa is a 63 y.o. female presenting for acute left knee injury today while at work.  Patient states that she slipped and fell landing directly onto her knee and has had significant worsening, moderate to severe sharp and aching left knee pain extending into her upper shin.  Has had significant swelling as well.  Has tried to keep icing on it.  Patient is a diabetic, last A1c was 2 months ago and was at a 6.0%.  Patient has CKD, last GFR levels have been consistent with stage III dating back to 2017.  No current facility-administered medications for this encounter.   Current Outpatient Medications:  .  atorvastatin (LIPITOR) 40 MG tablet, Take 1 tablet (40 mg total) by mouth daily., Disp: 90 tablet, Rfl: 0 .  fenofibrate 160 MG tablet, Take 1 tablet (160 mg total) by mouth daily., Disp: 90 tablet, Rfl: 1 .  Lancets (ONETOUCH ULTRASOFT) lancets, Use as instructed, Disp: 100 each, Rfl: 12 .  lisinopril-hydrochlorothiazide (PRINZIDE,ZESTORETIC) 20-12.5 MG tablet, Take 2 tablets by mouth daily., Disp: 180 tablet, Rfl: 1 .  metFORMIN (GLUCOPHAGE) 500 MG tablet, TAKE ONE TABLET BY MOUTH TWICE A DAY WITH A MEAL, Disp: 60 tablet, Rfl: 2 .  metoprolol succinate (TOPROL-XL) 100 MG 24 hr tablet, TAKE ONE TABLET BY MOUTH DAILY WITH OR IMMEDIATELY FOLLOWING A MEAL, Disp: 90 tablet, Rfl: 1 .  ONETOUCH VERIO test strip, USE AS DIRECTED TO CHECK BLOOD SUGAR ONCE DAILY, Disp: 100 each, Rfl: 10 .  sertraline (ZOLOFT) 100 MG tablet, Take 1 tablet (100 mg total) by mouth daily., Disp: 90 tablet, Rfl: 1 .  sertraline (ZOLOFT) 100 MG tablet, TAKE ONE TABLET BY MOUTH DAILY, Disp: 30 tablet, Rfl: 5   No Known Allergies  Past Medical History:  Diagnosis Date  . Cellulitis   . Depression   . Hyperlipidemia   . Hypertension   . Seasonal allergies      Past Surgical History:  Procedure Laterality Date  . BACK SURGERY    . BREAST SURGERY     lumpectomy  left breast  . LUMBAR FUSION      Objective:   Vitals: BP 127/74 (BP Location: Right Arm)   Pulse 66   Temp 97.8 F (36.6 C) (Oral)   Resp 18   SpO2 100%   Physical Exam Constitutional:      General: She is not in acute distress.    Appearance: Normal appearance. She is well-developed. She is not ill-appearing.  HENT:     Head: Normocephalic and atraumatic.     Nose: Nose normal.     Mouth/Throat:     Mouth: Mucous membranes are moist.     Pharynx: Oropharynx is clear.  Eyes:     General: No scleral icterus.    Extraocular Movements: Extraocular movements intact.     Pupils: Pupils are equal, round, and reactive to light.  Cardiovascular:     Rate and Rhythm: Normal rate.  Pulmonary:     Effort: Pulmonary effort is normal.  Musculoskeletal:     Left knee: She exhibits decreased range of motion, swelling and bony tenderness. She exhibits no ecchymosis, no deformity, no laceration and no erythema. Tenderness (Over area depicted with a 2 cm superficial abrasion) found.       Legs:  Skin:    General: Skin is warm and dry.  Neurological:     General: No focal deficit present.     Mental Status:  She is alert and oriented to person, place, and time.  Psychiatric:        Mood and Affect: Mood normal.        Behavior: Behavior normal.    Dg Tibia/fibula Left  Result Date: 03/26/2018 CLINICAL DATA:  Left leg pain post fall. EXAM: LEFT TIBIA AND FIBULA - 2 VIEW COMPARISON:  None. FINDINGS: There is no evidence of fracture or other focal bone lesions. Mild anterior soft tissue swelling. IMPRESSION: No acute fracture or dislocation identified about the left lower extremity. Electronically Signed   By: Fidela Salisbury M.D.   On: 03/26/2018 20:07   Dg Knee Complete 4 Views Left  Result Date: 03/26/2018 CLINICAL DATA:  Pain after fall EXAM: LEFT KNEE - COMPLETE 4+ VIEW COMPARISON:  December 06, 2012 FINDINGS: Chondrocalcinosis. No acute fracture or joint effusion noted. Mild  anterior soft tissue swelling. Mild tricompartmental degenerative changes. IMPRESSION: Chondrocalcinosis consistent with CPPD. Mild anterior soft tissue swelling. Mild tricompartmental degenerative changes. No fracture noted. Electronically Signed   By: Dorise Bullion III M.D   On: 03/26/2018 19:55   Assessment and Plan :   Acute pain of left knee  Contusion of left knee, initial encounter  Abrasion of left knee, initial encounter  Osteoarthritis of left knee, unspecified osteoarthritis type  Controlled type 2 diabetes mellitus without complication, without long-term current use of insulin ([place])  Counseled patient extensively on her lab results despite that she has a PCP.  I recommended that we use prednisone because of her previous GFR levels.  Will use Ace wrap today to help with compression.  I counseled patient that she does not have to memorize the dosing for prednisone as it will be written on her prescription.  Patient is to follow-up with her PCP.  Work note written for patient for restrictions for the next week.  She can follow-up with occupational health.  Return-to-clinic precautions discussed, patient verbalized understanding.    Jaynee Eagles, PA-C 03/26/18 2021    Jaynee Eagles, PA-C 03/26/18 2023

## 2018-03-26 NOTE — ED Triage Notes (Signed)
Pt sts fell on left knee today with pain

## 2018-04-02 ENCOUNTER — Other Ambulatory Visit: Payer: Self-pay | Admitting: Family Medicine

## 2018-04-02 DIAGNOSIS — I10 Essential (primary) hypertension: Secondary | ICD-10-CM

## 2018-05-02 ENCOUNTER — Other Ambulatory Visit: Payer: Self-pay | Admitting: Family Medicine

## 2018-05-04 ENCOUNTER — Other Ambulatory Visit: Payer: Self-pay | Admitting: *Deleted

## 2018-06-07 ENCOUNTER — Telehealth: Payer: Self-pay | Admitting: Family Medicine

## 2018-06-08 NOTE — Telephone Encounter (Signed)
See Refill request for Metformin and lab result note from 01/15/18. Message was left for pt to return office's call re: abnormal labs and PCP concern that metformin could have been contributing to her increased kidney function. Left message for pt to return our call. We can send 2 week supply and have pt return for labs per 01/15/18 lab result note. Do not send refill until pt return's our call. Will also need to place referrals per previous lab result note.

## 2018-06-18 ENCOUNTER — Other Ambulatory Visit: Payer: Self-pay | Admitting: Family Medicine

## 2018-06-18 NOTE — Telephone Encounter (Signed)
Needs doxy/ web ex scheduled

## 2018-06-18 NOTE — Telephone Encounter (Signed)
Called patient to schedule visit with provider per response to patient phone call.. Left message for return call.

## 2018-06-18 NOTE — Telephone Encounter (Signed)
Patient calling to find out when she will get metformin? Open to e-visit. [email or web address]

## 2018-06-18 NOTE — Telephone Encounter (Signed)
We can to web / doxy visit

## 2018-06-19 NOTE — Telephone Encounter (Signed)
Left message machine for patient to call back to schedule appointment

## 2018-06-21 NOTE — Telephone Encounter (Signed)
Pt returned your call and left a vm that she is willing to schedule an appt. She would prefer Fridays in the late afternoon

## 2018-06-25 ENCOUNTER — Telehealth: Payer: Self-pay

## 2018-06-25 NOTE — Telephone Encounter (Signed)
Copied from [place] [id number]. Topic: General - Other >> Jun 22, 2018  9:20 AM Carolyn Stare wrote:   Pt left VM on [facility] appt line saying she need to schedule an appt please call

## 2018-06-25 NOTE — Telephone Encounter (Signed)
Appointment scheduled for patient

## 2018-06-25 NOTE — Telephone Encounter (Signed)
Left message on machine for patient to call back between 8-4 to schedule appt for Friday.

## 2018-07-13 ENCOUNTER — Ambulatory Visit: Payer: Self-pay | Admitting: Family Medicine

## 2018-07-13 ENCOUNTER — Other Ambulatory Visit: Payer: Self-pay

## 2018-08-03 ENCOUNTER — Ambulatory Visit: Payer: Self-pay | Admitting: Family Medicine

## 2018-08-14 ENCOUNTER — Other Ambulatory Visit: Payer: Self-pay | Admitting: Family Medicine

## 2018-08-15 ENCOUNTER — Other Ambulatory Visit: Payer: Self-pay | Admitting: *Deleted

## 2018-08-15 DIAGNOSIS — E785 Hyperlipidemia, unspecified: Secondary | ICD-10-CM

## 2018-08-15 DIAGNOSIS — F320 Major depressive disorder, single episode, mild: Secondary | ICD-10-CM

## 2018-08-15 MED ORDER — ATORVASTATIN CALCIUM 40 MG PO TABS: 40.0000 mg | ORAL_TABLET | Freq: Every day | ORAL | 0 refills | Status: DC

## 2018-08-15 MED ORDER — FENOFIBRATE 160 MG PO TABS: 160.0000 mg | ORAL_TABLET | Freq: Every day | ORAL | 0 refills | Status: DC

## 2018-08-15 MED ORDER — SERTRALINE HCL 100 MG PO TABS: 100.0000 mg | ORAL_TABLET | Freq: Every day | ORAL | 0 refills | Status: DC

## 2018-08-19 ENCOUNTER — Other Ambulatory Visit: Payer: Self-pay | Admitting: Family Medicine

## 2018-08-19 DIAGNOSIS — E785 Hyperlipidemia, unspecified: Secondary | ICD-10-CM

## 2018-09-21 ENCOUNTER — Other Ambulatory Visit: Payer: Self-pay | Admitting: Family Medicine

## 2018-09-21 ENCOUNTER — Other Ambulatory Visit: Payer: Self-pay

## 2018-09-21 DIAGNOSIS — I10 Essential (primary) hypertension: Secondary | ICD-10-CM

## 2018-09-21 MED ORDER — LISINOPRIL-HYDROCHLOROTHIAZIDE 20-12.5 MG PO TABS: 2.0000 | ORAL_TABLET | Freq: Every day | ORAL | 1 refills | Status: DC

## 2018-09-21 NOTE — Telephone Encounter (Signed)
Rx sent 

## 2018-09-21 NOTE — Telephone Encounter (Signed)
lisinopril-hydrochlorothiazide (PRINZIDE,ZESTORETIC) 20-12.5 MG tablet [[id number]]    [facility] [address] [place] [phone] (Phone) [phone] (Fax

## 2018-10-11 ENCOUNTER — Other Ambulatory Visit: Payer: Self-pay

## 2018-10-11 ENCOUNTER — Encounter: Payer: Self-pay | Admitting: Family Medicine

## 2018-10-11 ENCOUNTER — Ambulatory Visit (INDEPENDENT_AMBULATORY_CARE_PROVIDER_SITE_OTHER): Payer: Self-pay | Admitting: Family Medicine

## 2018-10-11 DIAGNOSIS — K580 Irritable bowel syndrome with diarrhea: Secondary | ICD-10-CM

## 2018-10-11 DIAGNOSIS — F320 Major depressive disorder, single episode, mild: Secondary | ICD-10-CM

## 2018-10-11 DIAGNOSIS — E785 Hyperlipidemia, unspecified: Secondary | ICD-10-CM

## 2018-10-11 DIAGNOSIS — E1165 Type 2 diabetes mellitus with hyperglycemia: Secondary | ICD-10-CM

## 2018-10-11 DIAGNOSIS — I10 Essential (primary) hypertension: Secondary | ICD-10-CM

## 2018-10-11 MED ORDER — LISINOPRIL-HYDROCHLOROTHIAZIDE 20-12.5 MG PO TABS: 2.0000 | ORAL_TABLET | Freq: Every day | ORAL | 1 refills | Status: DC

## 2018-10-11 MED ORDER — METOPROLOL SUCCINATE ER 100 MG PO TB24: ORAL_TABLET | ORAL | 1 refills | Status: DC

## 2018-10-11 MED ORDER — FENOFIBRATE 160 MG PO TABS: 160.0000 mg | ORAL_TABLET | Freq: Every day | ORAL | 1 refills | Status: DC

## 2018-10-11 MED ORDER — SERTRALINE HCL 100 MG PO TABS: 100.0000 mg | ORAL_TABLET | Freq: Every day | ORAL | 1 refills | Status: DC

## 2018-10-11 MED ORDER — ATORVASTATIN CALCIUM 40 MG PO TABS: 40.0000 mg | ORAL_TABLET | Freq: Every day | ORAL | 1 refills | Status: DC

## 2018-10-11 MED ORDER — METFORMIN HCL 500 MG PO TABS: ORAL_TABLET | ORAL | 1 refills | Status: DC

## 2018-10-11 NOTE — Progress Notes (Signed)
Virtual Visit via Video Note  I connected with Deborah Figueroa on 93/81/82 at  3:15 PM EDT by a video enabled telemedicine application and verified that I am speaking with the correct person using two identifiers.  Location: Patient: home  Provider: office   I discussed the limitations of evaluation and management by telemedicine and the availability of in person appointments. The patient expressed understanding and agreed to proceed.  History of Present Illness:  pt is home and needs refills and labs.  She is having trouble with her IBS and needs to see GI again.  Its been a long time since she has seen hime    Observations/Objective: No vitals obtained today Pt is in NAD  Assessment and Plan: 1. Depression, major, single episode, mild (HCC) Stable con't meds  - sertraline (ZOLOFT) 100 MG tablet; Take 1 tablet (100 mg total) by mouth daily.  Dispense: 90 tablet; Refill: 1  2. Essential hypertension Well controlled, no changes to meds. Encouraged heart healthy diet such as the DASH diet and exercise as tolerated.   - metoprolol succinate (TOPROL-XL) 100 MG 24 hr tablet; TAKE ONE TABLET BY MOUTH DAILY WITH OR IMMEDIATELY FOLLOWING A MEAL  Dispense: 90 tablet; Refill: 1 - lisinopril-hydrochlorothiazide (ZESTORETIC) 20-12.5 MG tablet; Take 2 tablets by mouth daily.  Dispense: 180 tablet; Refill: 1 - Comprehensive metabolic panel; Future  3. Hyperlipidemia, unspecified hyperlipidemia type Tolerating statin, encouraged heart healthy diet, avoid trans fats, minimize simple carbs and saturated fats. Increase exercise as tolerated - fenofibrate 160 MG tablet; Take 1 tablet (160 mg total) by mouth daily.  Dispense: 90 tablet; Refill: 1 - atorvastatin (LIPITOR) 40 MG tablet; Take 1 tablet (40 mg total) by mouth daily.  Dispense: 90 tablet; Refill: 1 - Lipid panel; Future - Comprehensive metabolic panel; Future  4. Uncontrolled type 2 diabetes mellitus with hyperglycemia ([place]) hgba1c to  be checked, minimize simple carbs. Increase exercise as tolerated. Continue current meds  - metFORMIN (GLUCOPHAGE) 500 MG tablet; TAKE ONE TABLET BY MOUTH TWICE A DAY WITH A MEAL  Dispense: 180 tablet; Refill: 1 - Comprehensive metabolic panel; Future - Hemoglobin A1c; Future  5. Irritable bowel syndrome with diarrhea Worsening====  Refer to gi - Ambulatory referral to Gastroenterology   Follow Up Instructions:    I discussed the assessment and treatment plan with the patient. The patient was provided an opportunity to ask questions and all were answered. The patient agreed with the plan and demonstrated an understanding of the instructions.   The patient was advised to call back or seek an in-person evaluation if the symptoms worsen or if the condition fails to improve as anticipated.  I provided 25 minutes of non-face-to-face time during this encounter.   Ann Held, DO

## 2018-10-12 ENCOUNTER — Telehealth: Payer: Self-pay | Admitting: Family Medicine

## 2018-10-12 NOTE — Telephone Encounter (Signed)
LVM for pt to call the office and schedule 1-2 wk lab appt and 6 month cpe appt with provider.

## 2018-10-25 ENCOUNTER — Encounter: Payer: Self-pay | Admitting: Gastroenterology

## 2018-12-04 ENCOUNTER — Ambulatory Visit: Payer: Self-pay | Admitting: Gastroenterology

## 2019-02-09 ENCOUNTER — Emergency Department (HOSPITAL_COMMUNITY): Payer: Self-pay

## 2019-02-09 ENCOUNTER — Other Ambulatory Visit: Payer: Self-pay

## 2019-02-09 ENCOUNTER — Emergency Department (HOSPITAL_COMMUNITY)
Admission: EM | Admit: 2019-02-09 | Discharge: 2019-02-09 | Disposition: A | Discharge status: Home / Self Care | Payer: Self-pay | Attending: Emergency Medicine | Admitting: Emergency Medicine

## 2019-02-09 ENCOUNTER — Encounter (HOSPITAL_COMMUNITY): Payer: Self-pay

## 2019-02-09 DIAGNOSIS — I10 Essential (primary) hypertension: Secondary | ICD-10-CM | POA: Insufficient documentation

## 2019-02-09 DIAGNOSIS — Z7984 Long term (current) use of oral hypoglycemic drugs: Secondary | ICD-10-CM | POA: Insufficient documentation

## 2019-02-09 DIAGNOSIS — Y9384 Activity, sleeping: Secondary | ICD-10-CM | POA: Insufficient documentation

## 2019-02-09 DIAGNOSIS — Z79899 Other long term (current) drug therapy: Secondary | ICD-10-CM | POA: Insufficient documentation

## 2019-02-09 DIAGNOSIS — E119 Type 2 diabetes mellitus without complications: Secondary | ICD-10-CM | POA: Insufficient documentation

## 2019-02-09 DIAGNOSIS — S0990XA Unspecified injury of head, initial encounter: Secondary | ICD-10-CM

## 2019-02-09 DIAGNOSIS — S0003XA Contusion of scalp, initial encounter: Secondary | ICD-10-CM | POA: Insufficient documentation

## 2019-02-09 DIAGNOSIS — W07XXXA Fall from chair, initial encounter: Secondary | ICD-10-CM | POA: Insufficient documentation

## 2019-02-09 DIAGNOSIS — Y999 Unspecified external cause status: Secondary | ICD-10-CM | POA: Insufficient documentation

## 2019-02-09 DIAGNOSIS — Y92811 Bus as the place of occurrence of the external cause: Secondary | ICD-10-CM | POA: Insufficient documentation

## 2019-02-09 LAB — CBG MONITORING, ED: Glucose-Capillary: 94 mg/dL (ref 70–99)

## 2019-02-09 MED ORDER — IBUPROFEN 200 MG PO TABS
400.0000 mg | ORAL_TABLET | Freq: Once | ORAL | Status: AC
  Administered 2019-02-09: 400 mg via ORAL
  Filled 2019-02-09: qty 2

## 2019-02-09 MED ORDER — ACETAMINOPHEN 500 MG PO TABS
1000.0000 mg | ORAL_TABLET | Freq: Once | ORAL | Status: AC
  Administered 2019-02-09: 18:00:00 1000 mg via ORAL
  Filled 2019-02-09: qty 2
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
## 2019-02-09 NOTE — ED Provider Notes (Signed)
[facility] DEPT Provider Note   CSN: [id number] Arrival date & time: 02/09/19  1417     History   Chief Complaint Chief Complaint  Patient presents with  . Fall  . Head Injury    HPI Deborah Figueroa is a 63 y.o. female.     HPI Patient states that while she was sitting on the bus she fell asleep and slipped off of the seat.  She struck her head on the floor.  Sustained hematoma to the right occipital scalp.  States she was concerned over the hematoma and decided to get evaluated.  States she initially did have some changes in her vision initially but this is improved.  Complaining of generalized headache.  Patient is not on a blood thinner.  Denies any neck pain.  Denies focal weakness or numbness. Past Medical History:  Diagnosis Date  . Cellulitis   . Depression   . Hyperlipidemia   . Hypertension   . Seasonal allergies     Patient Active Problem List   Diagnosis Date Noted  . DM (diabetes mellitus) type II uncontrolled, periph vascular disorder ([place]) 01/12/2018  . Daytime somnolence 10/02/2017  . Diabetes mellitus type II, uncontrolled ([place]) 08/12/2016  . Toenail fungus 07/31/2012  . HYPERCALCEMIA 04/10/2009  . Hyperlipidemia 02/06/2008  . Depression, major, single episode, moderate ([place]) 02/06/2008  . Essential hypertension 02/06/2008    Past Surgical History:  Procedure Laterality Date  . BACK SURGERY    . BREAST SURGERY     lumpectomy left breast  . LUMBAR FUSION       OB History   No obstetric history on file.      Home Medications    Prior to Admission medications   Medication Sig Start Date End Date Taking? Authorizing Provider  atorvastatin (LIPITOR) 40 MG tablet Take 1 tablet (40 mg total) by mouth daily. 10/11/18   Roma Schanz R, DO  fenofibrate 160 MG tablet Take 1 tablet (160 mg total) by mouth daily. 10/11/18   Ann Held, DO  Lancets [facility] ULTRASOFT) lancets Use as instructed 10/02/17    Carollee Herter, Alferd Apa, DO  lisinopril-hydrochlorothiazide (ZESTORETIC) 20-12.5 MG tablet Take 2 tablets by mouth daily. 10/11/18   Roma Schanz R, DO  metFORMIN (GLUCOPHAGE) 500 MG tablet TAKE ONE TABLET BY MOUTH TWICE A DAY WITH A MEAL 10/11/18   Carollee Herter, Kendrick Fries R, DO  metoprolol succinate (TOPROL-XL) 100 MG 24 hr tablet TAKE ONE TABLET BY MOUTH DAILY WITH OR IMMEDIATELY FOLLOWING A MEAL 10/11/18   Lowne Chase, Alferd Apa, DO  ONETOUCH VERIO test strip USE AS DIRECTED TO CHECK BLOOD SUGAR ONCE DAILY 10/02/17   Carollee Herter, Alferd Apa, DO  sertraline (ZOLOFT) 100 MG tablet Take 1 tablet (100 mg total) by mouth daily. 10/11/18   Ann Held, DO    Family History Family History  Problem Relation Age of Onset  . Stroke Mother   . Hypertension Mother   . Hyperlipidemia Mother   . Cancer Father 35       liver and colon  . Depression Father   . Cancer Maternal Aunt        breast  . Diabetes Brother   . Cancer Maternal Grandmother        breast    Social History Social History   Tobacco Use  . Smoking status: Never Smoker  . Smokeless tobacco: Never Used  Substance Use Topics  . Alcohol use: No  .  Drug use: No     Allergies   Patient has no known allergies.   Review of Systems Review of Systems  Constitutional: Negative for chills, fatigue and fever.  HENT: Negative for facial swelling.   Eyes: Positive for visual disturbance. Negative for pain.  Gastrointestinal: Negative for nausea and vomiting.  Genitourinary: Negative for dysuria, flank pain and frequency.  Musculoskeletal: Negative for back pain, myalgias and neck pain.  Skin: Positive for wound. Negative for rash.  Neurological: Positive for light-headedness and headaches. Negative for syncope, speech difficulty, weakness and numbness.  All other systems reviewed and are negative.    Physical Exam Updated Vital Signs BP (!) 146/87   Pulse 80   Temp 98.5 F (36.9 C) (Oral)   Resp 16   Ht 5\' 11"   (1.803 m)   Wt 83.9 kg   SpO2 96%   BMI 25.80 kg/m   Physical Exam Vitals signs and nursing note reviewed.  Constitutional:      Appearance: Normal appearance. She is well-developed.  HENT:     Head: Normocephalic.     Comments: Large right occipital hematoma.  Midface is stable.    Nose: Nose normal.     Mouth/Throat:     Mouth: Mucous membranes are moist.  Eyes:     Extraocular Movements: Extraocular movements intact.     Pupils: Pupils are equal, round, and reactive to light.  Neck:     Musculoskeletal: Normal range of motion and neck supple. No neck rigidity or muscular tenderness.     Comments: No posterior midline cervical tenderness to palpation. Cardiovascular:     Rate and Rhythm: Normal rate and regular rhythm.     Heart sounds: No murmur. No friction rub. No gallop.   Pulmonary:     Effort: Pulmonary effort is normal. No respiratory distress.     Breath sounds: Normal breath sounds. No stridor. No wheezing, rhonchi or rales.  Chest:     Chest wall: No tenderness.  Abdominal:     General: Abdomen is flat. Bowel sounds are normal.     Palpations: Abdomen is soft.     Tenderness: There is no abdominal tenderness. There is no guarding or rebound.  Musculoskeletal: Normal range of motion.        General: No swelling, tenderness, deformity or signs of injury.     Right lower leg: No edema.     Left lower leg: No edema.     Comments: No posterior midline thoracic or lumbar tenderness.  Pelvis is stable.  Lymphadenopathy:     Cervical: No cervical adenopathy.  Skin:    General: Skin is warm and dry.     Capillary Refill: Capillary refill takes less than 2 seconds.     Findings: No erythema or rash.  Neurological:     General: No focal deficit present.     Mental Status: She is alert and oriented to person, place, and time.     Comments: Patient is alert and oriented x3 with clear, goal oriented speech. Patient has 5/5 motor in all extremities. Sensation is intact to  light touch. Patient has a normal gait and walks without assistance.  Psychiatric:        Behavior: Behavior normal.     Comments: Anxious appearing      ED Treatments / Results  Labs (all labs ordered are listed, but only abnormal results are displayed) Labs Reviewed  CBG MONITORING, ED    EKG None  Radiology Ct Head Wo  Contrast  Result Date: 02/09/2019 CLINICAL DATA:  Pain following fall EXAM: CT HEAD WITHOUT CONTRAST TECHNIQUE: Contiguous axial images were obtained from the base of the skull through the vertex without intravenous contrast. COMPARISON:  None. FINDINGS: Brain: Ventricles are normal in size and configuration. There is no intracranial mass, hemorrhage, extra-axial fluid collection, or midline shift. Brain parenchyma appears unremarkable. No evident acute infarct. Vascular: No hyperdense vessels. There is calcification in the carotid siphon regions. Skull: The bony calvarium appears intact. There is a right parietal scalp hematoma. Sinuses/Orbits: There is mucosal thickening in several ethmoid air cells. Other visualized paranasal sinuses are clear. Orbits appear symmetric bilaterally. Other: Mastoid air cells are clear. There is debris in the right external and to a lesser extent left external auditory canal. IMPRESSION: Brain parenchyma appears unremarkable. No mass or hemorrhage. No extra-axial fluid. There are foci of arterial vascular calcification. Right parietal scalp hematoma.  No fracture evident. Mucosal thickening in several ethmoid air cells. Probable cerumen in each external auditory canal, more on the right than on the left. Electronically Signed   By: Lowella Grip III M.D.   On: 02/09/2019 17:18    Procedures Procedures (including critical care time)  Medications Ordered in ED Medications  acetaminophen (TYLENOL) tablet 1,000 mg (1,000 mg Oral Given 02/09/19 1743)  ibuprofen (ADVIL) tablet 400 mg (400 mg Oral Given 02/09/19 1743)     Initial  Impression / Assessment and Plan / ED Course  I have reviewed the triage vital signs and the nursing notes.  Pertinent labs & imaging results that were available during my care of the patient were reviewed by me and considered in my medical decision making (see chart for details).       CT head without intracranial findings. Vital signs are stable. Normal neurologic exam. Advised to place ice on hematoma. Head injury precautions given.   Final Clinical Impressions(s) / ED Diagnoses   Final diagnoses:  Injury of head, initial encounter  Scalp hematoma, initial encounter    ED Discharge Orders    None       Julianne Rice, MD 02/09/19 1805

## 2019-02-14 ENCOUNTER — Emergency Department (HOSPITAL_COMMUNITY): Payer: Self-pay

## 2019-02-14 ENCOUNTER — Emergency Department (HOSPITAL_COMMUNITY)
Admission: EM | Admit: 2019-02-14 | Discharge: 2019-02-15 | Disposition: A | Discharge status: Home / Self Care | Payer: Self-pay | Attending: Emergency Medicine | Admitting: Emergency Medicine

## 2019-02-14 ENCOUNTER — Encounter (HOSPITAL_COMMUNITY): Payer: Self-pay

## 2019-02-14 ENCOUNTER — Other Ambulatory Visit: Payer: Self-pay

## 2019-02-14 DIAGNOSIS — I10 Essential (primary) hypertension: Secondary | ICD-10-CM | POA: Insufficient documentation

## 2019-02-14 DIAGNOSIS — Z7984 Long term (current) use of oral hypoglycemic drugs: Secondary | ICD-10-CM | POA: Insufficient documentation

## 2019-02-14 DIAGNOSIS — N289 Disorder of kidney and ureter, unspecified: Secondary | ICD-10-CM | POA: Insufficient documentation

## 2019-02-14 DIAGNOSIS — D649 Anemia, unspecified: Secondary | ICD-10-CM | POA: Insufficient documentation

## 2019-02-14 DIAGNOSIS — E119 Type 2 diabetes mellitus without complications: Secondary | ICD-10-CM | POA: Insufficient documentation

## 2019-02-14 DIAGNOSIS — R609 Edema, unspecified: Secondary | ICD-10-CM | POA: Insufficient documentation

## 2019-02-14 LAB — BASIC METABOLIC PANEL
Anion gap: 7 (ref 5–15)
BUN: 47 mg/dL — ABNORMAL HIGH (ref 8–23)
CO2: 23 mmol/L (ref 22–32)
Calcium: 12.3 mg/dL — ABNORMAL HIGH (ref 8.9–10.3)
Chloride: 107 mmol/L (ref 98–111)
Creatinine, Ser: 2.33 mg/dL — ABNORMAL HIGH (ref 0.44–1.00)
GFR calc Af Amer: 25 mL/min — ABNORMAL LOW (ref >60)
GFR calc non Af Amer: 22 mL/min — ABNORMAL LOW (ref >60)
Glucose, Bld: 101 mg/dL — ABNORMAL HIGH (ref 70–99)
Potassium: 3.7 mmol/L (ref 3.5–5.1)
Sodium: 137 mmol/L (ref 135–145)

## 2019-02-14 LAB — CBC WITH DIFFERENTIAL/PLATELET
Abs Immature Granulocytes: 0.03 10*3/uL (ref 0.00–0.07)
Basophils Absolute: 0 10*3/uL (ref 0.0–0.1)
Basophils Relative: 1 %
Eosinophils Absolute: 0.3 10*3/uL (ref 0.0–0.5)
Eosinophils Relative: 5 %
HCT: 32.1 % — ABNORMAL LOW (ref 36.0–46.0)
Hemoglobin: 9.5 g/dL — ABNORMAL LOW (ref 12.0–15.0)
Immature Granulocytes: 1 %
Lymphocytes Relative: 16 %
Lymphs Abs: 0.9 10*3/uL (ref 0.7–4.0)
MCH: 29.5 pg (ref 26.0–34.0)
MCHC: 29.6 g/dL — ABNORMAL LOW (ref 30.0–36.0)
MCV: 99.7 fL (ref 80.0–100.0)
Monocytes Absolute: 0.5 10*3/uL (ref 0.1–1.0)
Monocytes Relative: 8 %
Neutro Abs: 4.1 10*3/uL (ref 1.7–7.7)
Neutrophils Relative %: 69 %
Platelets: 243 10*3/uL (ref 150–400)
RBC: 3.22 MIL/uL — ABNORMAL LOW (ref 3.87–5.11)
RDW: 14.5 % (ref 11.5–15.5)
WBC: 5.8 10*3/uL (ref 4.0–10.5)
nRBC: 0 % (ref 0.0–0.2)

## 2019-02-14 LAB — BRAIN NATRIURETIC PEPTIDE: B Natriuretic Peptide: 47.8 pg/mL (ref 0.0–100.0)

## 2019-02-14 NOTE — ED Provider Notes (Signed)
[place] DEPT Provider Note   CSN: [id number] Arrival date & time: 02/14/19  2122    History   Chief Complaint Chief Complaint  Patient presents with  . Leg Swelling    HPI Deborah Figueroa is a 63 y.o. female.   The history is provided by the patient.  She has history of hypertension, diabetes, hyperlipidemia and comes in because of bilateral leg swelling.  She thinks her legs may have been swelling for about a week, but she noticed significant swelling over the last 1-2 days.  She denies any chest pain or dyspnea.  She has has not had any problem with leg swelling in the past.  She denies fever or chills and denies any pain.  Past Medical History:  Diagnosis Date  . Cellulitis   . Depression   . Hyperlipidemia   . Hypertension   . Seasonal allergies     Patient Active Problem List   Diagnosis Date Noted  . DM (diabetes mellitus) type II uncontrolled, periph vascular disorder ([place]) 01/12/2018  . Daytime somnolence 10/02/2017  . Diabetes mellitus type II, uncontrolled ([place]) 08/12/2016  . Toenail fungus 07/31/2012  . HYPERCALCEMIA 04/10/2009  . Hyperlipidemia 02/06/2008  . Depression, major, single episode, moderate ([place]) 02/06/2008  . Essential hypertension 02/06/2008    Past Surgical History:  Procedure Laterality Date  . BACK SURGERY    . BREAST SURGERY     lumpectomy left breast  . LUMBAR FUSION       OB History   No obstetric history on file.      Home Medications    Prior to Admission medications   Medication Sig Start Date End Date Taking? Authorizing Provider  atorvastatin (LIPITOR) 40 MG tablet Take 1 tablet (40 mg total) by mouth daily. 10/11/18   Roma Schanz R, DO  fenofibrate 160 MG tablet Take 1 tablet (160 mg total) by mouth daily. 10/11/18   Ann Held, DO  Lancets [facility] ULTRASOFT) lancets Use as instructed 10/02/17   Carollee Herter, Alferd Apa, DO  lisinopril-hydrochlorothiazide  (ZESTORETIC) 20-12.5 MG tablet Take 2 tablets by mouth daily. 10/11/18   Roma Schanz R, DO  metFORMIN (GLUCOPHAGE) 500 MG tablet TAKE ONE TABLET BY MOUTH TWICE A DAY WITH A MEAL 10/11/18   Carollee Herter, Kendrick Fries R, DO  metoprolol succinate (TOPROL-XL) 100 MG 24 hr tablet TAKE ONE TABLET BY MOUTH DAILY WITH OR IMMEDIATELY FOLLOWING A MEAL 10/11/18   Lowne Chase, Alferd Apa, DO  ONETOUCH VERIO test strip USE AS DIRECTED TO CHECK BLOOD SUGAR ONCE DAILY 10/02/17   Carollee Herter, Alferd Apa, DO  sertraline (ZOLOFT) 100 MG tablet Take 1 tablet (100 mg total) by mouth daily. 10/11/18   Ann Held, DO    Family History Family History  Problem Relation Age of Onset  . Stroke Mother   . Hypertension Mother   . Hyperlipidemia Mother   . Cancer Father 80       liver and colon  . Depression Father   . Cancer Maternal Aunt        breast  . Diabetes Brother   . Cancer Maternal Grandmother        breast    Social History Social History   Tobacco Use  . Smoking status: Never Smoker  . Smokeless tobacco: Never Used  Substance Use Topics  . Alcohol use: No  . Drug use: No     Allergies   Patient has no known  allergies.   Review of Systems Review of Systems  All other systems reviewed and are negative.    Physical Exam Updated Vital Signs BP (!) 141/79 (BP Location: Right Arm)   Pulse 74   Temp 98.2 F (36.8 C) (Oral)   Resp 16   Ht 5\' 11"  (1.803 m)   SpO2 99%   BMI 25.80 kg/m   Physical Exam Vitals signs and nursing note reviewed.    63 year old female, resting comfortably and in no acute distress. Vital signs are significant for borderline elevated blood pressure. Oxygen saturation is 99%, which is normal. Head is normocephalic and atraumatic. PERRLA, EOMI. Oropharynx is clear. Neck is nontender and supple without adenopathy or JVD. Back is nontender and there is no CVA tenderness. Lungs are clear without rales, wheezes, or rhonchi. Chest is nontender. Heart has  regular rate and rhythm without murmur. Abdomen is soft, flat, nontender without masses or hepatosplenomegaly and peristalsis is normoactive. Extremities have 2-3+ edema, full range of motion is present.  Skin is warm and dry without rash. Neurologic: Mental status is normal, cranial nerves are intact, there are no motor or sensory deficits.  ED Treatments / Results  Labs (all labs ordered are listed, but only abnormal results are displayed) Labs Reviewed  BASIC METABOLIC PANEL - Abnormal; Notable for the following components:      Result Value   Glucose, Bld 101 (*)    BUN 47 (*)    Creatinine, Ser 2.33 (*)    Calcium 12.3 (*)    GFR calc non Af Amer 22 (*)    GFR calc Af Amer 25 (*)    All other components within normal limits  CBC WITH DIFFERENTIAL/PLATELET - Abnormal; Notable for the following components:   RBC 3.22 (*)    Hemoglobin 9.5 (*)    HCT 32.1 (*)    MCHC 29.6 (*)    All other components within normal limits  URINALYSIS, ROUTINE W REFLEX MICROSCOPIC - Abnormal; Notable for the following components:   Hgb urine dipstick SMALL (*)    Leukocytes,Ua LARGE (*)    Bacteria, UA RARE (*)    All other components within normal limits  BRAIN NATRIURETIC PEPTIDE  HEPATIC FUNCTION PANEL   Radiology Dg Chest 2 View  Result Date: 02/15/2019 CLINICAL DATA:  Peripheral edema EXAM: CHEST - 2 VIEW COMPARISON:  08/27/2015 FINDINGS: Heart and mediastinal contours are within normal limits. No focal opacities or effusions. No acute bony abnormality. IMPRESSION: No active cardiopulmonary disease. Electronically Signed   By: Rolm Baptise M.D.   On: 02/15/2019 00:19    Procedures Procedures  Medications Ordered in ED Medications - No data to display   Initial Impression / Assessment and Plan / ED Course  I have reviewed the triage vital signs and the nursing notes.  Pertinent labs & imaging results that were available during my care of the patient were reviewed by me and  considered in my medical decision making (see chart for details).  Peripheral edema.  Initial labs are significant for hemoglobin 9.5, which is a 2 g drop compared with June 2017.  RBC indices are normal except for borderline low MCHC.  Also, creatinine is moderately elevated at 2.33.  On review of past records, she has had a gradual increase in her creatinine from 1.23 in May 2018, to 1.45 in July 2019, to 2.09 in November 2019.  Will check urinalysis and hepatic function studies to look for evidence of proteinuria and hypoalbuminemia, will  check chest x-ray.  BNP is normal, but frequently is normal and right-sided heart failure.  She will need to follow-up with PCP for evaluation of her anemia.  Albumin is normal, urinalysis shows no evidence of proteinuria, chest x-ray shows no cardiomegaly or other signs of heart failure.  She is discharged with prescription for furosemide, advised to stay on a low-salt diet and to weigh herself daily.  Follow-up with PCP for ongoing management of edema as well as work-up of her anemia and attempt to optimize her renal function.  Final Clinical Impressions(s) / ED Diagnoses   Final diagnoses:  Peripheral edema  Normocytic anemia  Renal insufficiency    ED Discharge Orders         Ordered    furosemide (LASIX) 40 MG tablet  Daily     02/15/19 3748           Delora Fuel, MD 27/07/86 0122

## 2019-02-14 NOTE — ED Triage Notes (Addendum)
Pt c/o bilateral leg swelling. Pt states she has been walking a lot, pt is ambulatory from triage. Pt legs are swollen bilaterally with pitting edema.  Pt denies COVID contact, CP, SHOB, headache or fever.

## 2019-02-15 ENCOUNTER — Emergency Department (HOSPITAL_COMMUNITY): Payer: Self-pay

## 2019-02-15 LAB — HEPATIC FUNCTION PANEL
ALT: 24 U/L (ref 0–44)
AST: 20 U/L (ref 15–41)
Albumin: 3.9 g/dL (ref 3.5–5.0)
Alkaline Phosphatase: 53 U/L (ref 38–126)
Bilirubin, Direct: 0.2 mg/dL (ref 0.0–0.2)
Indirect Bilirubin: 0.4 mg/dL (ref 0.3–0.9)
Total Bilirubin: 0.6 mg/dL (ref 0.3–1.2)
Total Protein: 7 g/dL (ref 6.5–8.1)

## 2019-02-15 LAB — URINALYSIS, ROUTINE W REFLEX MICROSCOPIC
Bilirubin Urine: NEGATIVE
Glucose, UA: NEGATIVE mg/dL
Ketones, ur: NEGATIVE mg/dL
Nitrite: NEGATIVE
Protein, ur: NEGATIVE mg/dL
Specific Gravity, Urine: 1.014 (ref 1.005–1.030)
pH: 5 (ref 5.0–8.0)

## 2019-02-15 MED ORDER — FUROSEMIDE 40 MG PO TABS: 40.0000 mg | ORAL_TABLET | Freq: Every day | ORAL | 0 refills | Status: DC

## 2019-02-15 NOTE — ED Notes (Signed)
Patient transported to X-ray 

## 2019-02-15 NOTE — Discharge Instructions (Addendum)
Please weigh yourself daily. You will have to work with your primary care provider to determine what your dry weight is. You will also have to work with her to make sure your kidneys do not get worse, and to find out the cause of your anemia.

## 2019-02-15 NOTE — ED Notes (Signed)
Discussed discharge instructions, medication administration, and a low sodium heart healthy diet in great length. Pt expressed concern that she was "starting a new job tomorrow and wanted a pill that would rid her legs of the water overnight." I explained this was not something that would happen overnight. She would need to take her medication and utilize lifestyle changes in order to decrease the edema and lower extremity pain.  Pt stated she was homeless and did not want to get a hotel, she would rather just stay here until morning. I explained that was not possible. Pt stated she would get dressed and let me know when she was ready to be walked out.

## 2019-02-15 NOTE — ED Notes (Signed)
Writer of this reviewed discharge instructions multiple times, reviewing prescriptions and education provided in discharge instruction. Pt continued with questions about visit from several days ago, Probation officer of this note reviewed medications provided from previous visit. Writer of this note reiterated new prescription, education on diagnosis and medication and eating a healthy diet along with low sodium intake, weighing self daily, and following up with PCP for other concerns. Pt was provided with 2 water bottles and 2 warm blankets. Pt was taken to waiting room by writer of this note in a wheelchair. Pt then requested to be taken to the bathroom. Pt is ambulatory in waiting room, walking to the bathroom.

## 2019-02-15 NOTE — ED Notes (Signed)
Pt ambulatory in waiting room carrying 2 large bags.

## 2019-04-05 ENCOUNTER — Other Ambulatory Visit: Payer: Self-pay | Admitting: Family Medicine

## 2019-04-05 DIAGNOSIS — F320 Major depressive disorder, single episode, mild: Secondary | ICD-10-CM

## 2019-05-03 ENCOUNTER — Other Ambulatory Visit: Payer: Self-pay | Admitting: Family Medicine

## 2019-05-03 DIAGNOSIS — E1165 Type 2 diabetes mellitus with hyperglycemia: Secondary | ICD-10-CM

## 2019-05-18 ENCOUNTER — Other Ambulatory Visit: Payer: Self-pay | Admitting: Family Medicine

## 2019-05-18 DIAGNOSIS — I10 Essential (primary) hypertension: Secondary | ICD-10-CM

## 2019-06-13 ENCOUNTER — Other Ambulatory Visit: Payer: Self-pay | Admitting: Family Medicine

## 2019-06-13 DIAGNOSIS — F320 Major depressive disorder, single episode, mild: Secondary | ICD-10-CM

## 2019-06-15 ENCOUNTER — Other Ambulatory Visit: Payer: Self-pay | Admitting: Family Medicine

## 2019-06-15 DIAGNOSIS — I10 Essential (primary) hypertension: Secondary | ICD-10-CM

## 2019-07-27 ENCOUNTER — Other Ambulatory Visit: Payer: Self-pay | Admitting: Family Medicine

## 2019-07-27 DIAGNOSIS — F320 Major depressive disorder, single episode, mild: Secondary | ICD-10-CM

## 2019-07-31 ENCOUNTER — Telehealth: Payer: Self-pay

## 2019-07-31 ENCOUNTER — Other Ambulatory Visit: Payer: Self-pay

## 2019-07-31 MED ORDER — ONETOUCH VERIO VI STRP: ORAL_STRIP | 10 refills | Status: AC

## 2019-07-31 NOTE — Telephone Encounter (Signed)
Refill sent.

## 2019-07-31 NOTE — Telephone Encounter (Signed)
Patient called in to get a prescription refill for [facility] VERIO test strip [[id number]]    Please send it to [facility] [address], [place]  [address] [place], [place] [address]  Phone:  [phone] Fax:  [phone]  DEA #:  --

## 2019-08-26 ENCOUNTER — Other Ambulatory Visit: Payer: Self-pay | Admitting: Family Medicine

## 2019-08-26 DIAGNOSIS — F320 Major depressive disorder, single episode, mild: Secondary | ICD-10-CM

## 2019-09-05 ENCOUNTER — Other Ambulatory Visit: Payer: Self-pay | Admitting: Family Medicine

## 2019-09-05 DIAGNOSIS — F320 Major depressive disorder, single episode, mild: Secondary | ICD-10-CM

## 2019-09-06 ENCOUNTER — Telehealth: Payer: Self-pay | Admitting: Family Medicine

## 2019-09-06 NOTE — Telephone Encounter (Signed)
Pt overdue for visit. Will need appt prior to refills.

## 2019-09-06 NOTE — Telephone Encounter (Signed)
Medication: sertraline (ZOLOFT) 100 MG tablet      Has the patient contacted their pharmacy? yes (If no, request that the patient contact the pharmacy for the refill.) (If yes, when and what did the pharmacy advise?)     Preferred Pharmacy (with phone number or street name): [facility] [address], [place] - [address]  [place], [place] [address]  Phone:  [phone] Fax:  [phone]      Agent: Please be advised that RX refills may take up to 3 business days. We ask that you follow-up with your pharmacy.

## 2019-09-09 NOTE — Telephone Encounter (Signed)
Left voice message to schedule appointment.

## 2019-09-13 ENCOUNTER — Encounter: Payer: Self-pay | Admitting: Family Medicine

## 2019-09-13 ENCOUNTER — Other Ambulatory Visit: Payer: Self-pay

## 2019-09-13 ENCOUNTER — Ambulatory Visit (INDEPENDENT_AMBULATORY_CARE_PROVIDER_SITE_OTHER): Payer: Self-pay | Admitting: Family Medicine

## 2019-09-13 VITALS — BP 120/86 | HR 63 | Temp 97.1°F | Resp 18 | Ht 71.0 in | Wt 221.4 lb

## 2019-09-13 DIAGNOSIS — E669 Obesity, unspecified: Secondary | ICD-10-CM

## 2019-09-13 DIAGNOSIS — E1165 Type 2 diabetes mellitus with hyperglycemia: Secondary | ICD-10-CM

## 2019-09-13 DIAGNOSIS — I10 Essential (primary) hypertension: Secondary | ICD-10-CM

## 2019-09-13 DIAGNOSIS — F320 Major depressive disorder, single episode, mild: Secondary | ICD-10-CM

## 2019-09-13 DIAGNOSIS — E785 Hyperlipidemia, unspecified: Secondary | ICD-10-CM

## 2019-09-13 MED ORDER — FENOFIBRATE 160 MG PO TABS: 160.0000 mg | ORAL_TABLET | Freq: Every day | ORAL | 1 refills | Status: DC

## 2019-09-13 MED ORDER — ATORVASTATIN CALCIUM 40 MG PO TABS: 40.0000 mg | ORAL_TABLET | Freq: Every day | ORAL | 1 refills | Status: DC

## 2019-09-13 MED ORDER — LISINOPRIL-HYDROCHLOROTHIAZIDE 20-12.5 MG PO TABS: 2.0000 | ORAL_TABLET | Freq: Every day | ORAL | 1 refills | Status: DC

## 2019-09-13 MED ORDER — SERTRALINE HCL 100 MG PO TABS: ORAL_TABLET | ORAL | 1 refills | Status: DC

## 2019-09-13 MED ORDER — METFORMIN HCL 500 MG PO TABS: ORAL_TABLET | ORAL | 1 refills | Status: DC

## 2019-09-13 NOTE — Assessment & Plan Note (Signed)
Encouraged heart healthy diet, increase exercise, avoid trans fats, consider a krill oil cap daily 

## 2019-09-13 NOTE — Assessment & Plan Note (Signed)
Well controlled, no changes to meds. Encouraged heart healthy diet such as the DASH diet and exercise as tolerated.  °

## 2019-09-13 NOTE — Patient Instructions (Signed)
Carbohydrate Counting for Diabetes Mellitus, Adult  Carbohydrate counting is a method of keeping track of how many carbohydrates you eat. Eating carbohydrates naturally increases the amount of sugar (glucose) in the blood. Counting how many carbohydrates you eat helps keep your blood glucose within normal limits, which helps you manage your diabetes (diabetes mellitus). It is important to know how many carbohydrates you can safely have in each meal. This is different for every person. A diet and nutrition specialist (registered dietitian) can help you make a meal plan and calculate how many carbohydrates you should have at each meal and snack. Carbohydrates are found in the following foods:  Grains, such as breads and cereals.  Dried beans and soy products.  Starchy vegetables, such as potatoes, peas, and corn.  Fruit and fruit juices.  Milk and yogurt.  Sweets and snack foods, such as cake, cookies, candy, chips, and soft drinks. How do I count carbohydrates? There are two ways to count carbohydrates in food. You can use either of the methods or a combination of both. Reading "Nutrition Facts" on packaged food The "Nutrition Facts" list is included on the labels of almost all packaged foods and beverages in the [place] It includes:  The serving size.  Information about nutrients in each serving, including the grams (g) of carbohydrate per serving. To use the "Nutrition Facts":  Decide how many servings you will have.  Multiply the number of servings by the number of carbohydrates per serving.  The resulting number is the total amount of carbohydrates that you will be having. Learning standard serving sizes of other foods When you eat carbohydrate foods that are not packaged or do not include "Nutrition Facts" on the label, you need to measure the servings in order to count the amount of carbohydrates:  Measure the foods that you will eat with a food scale or measuring cup, if  needed.  Decide how many standard-size servings you will eat.  Multiply the number of servings by 15. Most carbohydrate-rich foods have about 15 g of carbohydrates per serving. ? For example, if you eat 8 oz (170 g) of strawberries, you will have eaten 2 servings and 30 g of carbohydrates (2 servings x 15 g = 30 g).  For foods that have more than one food mixed, such as soups and casseroles, you must count the carbohydrates in each food that is included. The following list contains standard serving sizes of common carbohydrate-rich foods. Each of these servings has about 15 g of carbohydrates:   hamburger bun or  English muffin.   oz (15 mL) syrup.   oz (14 g) jelly.  1 slice of bread.  1 six-inch tortilla.  3 oz (85 g) cooked rice or pasta.  4 oz (113 g) cooked dried beans.  4 oz (113 g) starchy vegetable, such as peas, corn, or potatoes.  4 oz (113 g) hot cereal.  4 oz (113 g) mashed potatoes or  of a large baked potato.  4 oz (113 g) canned or frozen fruit.  4 oz (120 mL) fruit juice.  4-6 crackers.  6 chicken nuggets.  6 oz (170 g) unsweetened dry cereal.  6 oz (170 g) plain fat-free yogurt or yogurt sweetened with artificial sweeteners.  8 oz (240 mL) milk.  8 oz (170 g) fresh fruit or one small piece of fruit.  24 oz (680 g) popped popcorn. Example of carbohydrate counting Sample meal  3 oz (85 g) chicken breast.  6 oz (170 g)   brown rice.  4 oz (113 g) corn.  8 oz (240 mL) milk.  8 oz (170 g) strawberries with sugar-free whipped topping. Carbohydrate calculation 1. Identify the foods that contain carbohydrates: ? Rice. ? Corn. ? Milk. ? Strawberries. 2. Calculate how many servings you have of each food: ? 2 servings rice. ? 1 serving corn. ? 1 serving milk. ? 1 serving strawberries. 3. Multiply each number of servings by 15 g: ? 2 servings rice x 15 g = 30 g. ? 1 serving corn x 15 g = 15 g. ? 1 serving milk x 15 g = 15 g. ? 1  serving strawberries x 15 g = 15 g. 4. Add together all of the amounts to find the total grams of carbohydrates eaten: ? 30 g + 15 g + 15 g + 15 g = 75 g of carbohydrates total. Summary  Carbohydrate counting is a method of keeping track of how many carbohydrates you eat.  Eating carbohydrates naturally increases the amount of sugar (glucose) in the blood.  Counting how many carbohydrates you eat helps keep your blood glucose within normal limits, which helps you manage your diabetes.  A diet and nutrition specialist (registered dietitian) can help you make a meal plan and calculate how many carbohydrates you should have at each meal and snack. This information is not intended to replace advice given to you by your health care provider. Make sure you discuss any questions you have with your health care provider. Document Revised: 09/22/2016 Document Reviewed: 08/12/2015 Elsevier Patient Education  2020 [employer]  

## 2019-09-13 NOTE — Assessment & Plan Note (Signed)
hgba1c to be checked, minimize simple carbs. Increase exercise as tolerated. Continue current meds  

## 2019-09-13 NOTE — Progress Notes (Signed)
Patient ID: Deborah Figueroa, female    DOB: 1956-02-22  Age: 64 y.o. MRN: [id number]    Subjective:  Subjective  HPI Deborah Figueroa presents for f/u dm, cho and bp.    HYPERTENSION   Blood pressure range-not checking   Chest pain- no      Dyspnea- no Lightheadedness- no   Edema- no  Other side effects - no   Medication compliance: good Low salt diet- no     DIABETES    Blood Sugar ranges-80-135  Polyuria- no New Visual problems- no  Hypoglycemic symptoms- no  Other side effects-no Medication compliance - good Last eye exam- due Foot exam- today   HYPERLIPIDEMIA  Medication compliance- good RUQ pain- no  Muscle aches- no Other side effects-no       Review of Systems  Constitutional: Negative for appetite change, diaphoresis, fatigue and unexpected weight change.  Eyes: Negative for pain, redness and visual disturbance.  Respiratory: Negative for cough, chest tightness, shortness of breath and wheezing.   Cardiovascular: Negative for chest pain, palpitations and leg swelling.  Endocrine: Negative for cold intolerance, heat intolerance, polydipsia, polyphagia and polyuria.  Genitourinary: Negative for difficulty urinating, dysuria and frequency.  Neurological: Negative for dizziness, light-headedness, numbness and headaches.    History Past Medical History:  Diagnosis Date  . Cellulitis   . Depression   . Hyperlipidemia   . Hypertension   . Seasonal allergies     She has a past surgical history that includes Lumbar fusion; Breast surgery; and Back surgery.   Her family history includes Cancer in her maternal aunt and maternal grandmother; Cancer (age of onset: 26) in her father; Depression in her father; Diabetes in her brother; Hyperlipidemia in her mother; Hypertension in her mother; Stroke in her mother.She reports that she has never smoked. She has never used smokeless tobacco. She reports that she does not drink alcohol and does not use  drugs.  Current Outpatient Medications on File Prior to Visit  Medication Sig Dispense Refill  . furosemide (LASIX) 40 MG tablet Take 1 tablet (40 mg total) by mouth daily. 15 tablet 0  . glucose blood (ONETOUCH VERIO) test strip USE AS DIRECTED TO CHECK BLOOD SUGAR ONCE DAILY 100 each 10  . Lancets (ONETOUCH ULTRASOFT) lancets Use as instructed 100 each 12  . metoprolol succinate (TOPROL-XL) 100 MG 24 hr tablet TAKE ONE TABLET BY MOUTH DAILY WITH OR IMMEDIATELY FOLLOWING A MEAL 90 tablet 1   No current facility-administered medications on file prior to visit.     Objective:  Objective  Physical Exam Vitals and nursing note reviewed.  Constitutional:      Appearance: She is well-developed.  HENT:     Head: Normocephalic and atraumatic.  Eyes:     Conjunctiva/sclera: Conjunctivae normal.  Neck:     Thyroid: No thyromegaly.     Vascular: No carotid bruit or JVD.  Cardiovascular:     Rate and Rhythm: Normal rate and regular rhythm.     Heart sounds: Normal heart sounds. No murmur heard.   Pulmonary:     Effort: Pulmonary effort is normal. No respiratory distress.     Breath sounds: Normal breath sounds. No wheezing or rales.  Chest:     Chest wall: No tenderness.  Musculoskeletal:     Cervical back: Normal range of motion and neck supple.  Neurological:     Mental Status: She is alert and oriented to person, place, and time.    Diabetic Foot Exam -  Simple   Simple Foot Form Diabetic Foot exam was performed with the following findings: Yes 09/13/2019  3:57 PM  Visual Inspection No deformities, no ulcerations, no other skin breakdown bilaterally: Yes Sensation Testing Intact to touch and monofilament testing bilaterally: Yes Pulse Check Posterior Tibialis and Dorsalis pulse intact bilaterally: Yes Comments     BP 120/86 (BP Location: Right Arm, Patient Position: Sitting, Cuff Size: Large)   Pulse 63   Temp (!) 97.1 F (36.2 C) (Temporal)   Resp 18   Ht 5\' 11"  (1.803  m)   Wt 221 lb 6.4 oz (100.4 kg)   SpO2 100%   BMI 30.88 kg/m  Wt Readings from Last 3 Encounters:  09/13/19 221 lb 6.4 oz (100.4 kg)  02/09/19 185 lb (83.9 kg)  01/12/18 187 lb 9.6 oz (85.1 kg)     Lab Results  Component Value Date   WBC 5.8 02/14/2019   HGB 9.5 (L) 02/14/2019   HCT 32.1 (L) 02/14/2019   PLT 243 02/14/2019   GLUCOSE 101 (H) 02/14/2019   CHOL 167 01/12/2018   TRIG 102 01/12/2018   HDL 70 01/12/2018   LDLDIRECT 82.0 11/21/2014   LDLCALC 78 01/12/2018   ALT 24 02/14/2019   AST 20 02/14/2019   NA 137 02/14/2019   K 3.7 02/14/2019   CL 107 02/14/2019   CREATININE 2.33 (H) 02/14/2019   BUN 47 (H) 02/14/2019   CO2 23 02/14/2019   TSH 0.56 06/09/2011   HGBA1C 6.0 (H) 01/12/2018    DG Chest 2 View  Result Date: 02/15/2019 CLINICAL DATA:  Peripheral edema EXAM: CHEST - 2 VIEW COMPARISON:  08/27/2015 FINDINGS: Heart and mediastinal contours are within normal limits. No focal opacities or effusions. No acute bony abnormality. IMPRESSION: No active cardiopulmonary disease. Electronically Signed   By: Rolm Baptise M.D.   On: 02/15/2019 00:19     Assessment & Plan:  Plan  I have changed Deborah Figueroa's metFORMIN and lisinopril-hydrochlorothiazide. I am also having her maintain her onetouch ultrasoft, furosemide, metoprolol succinate, OneTouch Verio, fenofibrate, sertraline, and atorvastatin.  Meds ordered this encounter  Medications  . fenofibrate 160 MG tablet    Sig: Take 1 tablet (160 mg total) by mouth daily.    Dispense:  90 tablet    Refill:  1  . sertraline (ZOLOFT) 100 MG tablet    Sig: TAKE ONE TABLET BY MOUTH DAILY (NEED OFFICE VISIT FOR FOLLOW UP)    Dispense:  90 tablet    Refill:  1  . metFORMIN (GLUCOPHAGE) 500 MG tablet    Sig: 1 po bid    Dispense:  180 tablet    Refill:  1  . lisinopril-hydrochlorothiazide (ZESTORETIC) 20-12.5 MG tablet    Sig: Take 2 tablets by mouth daily.    Dispense:  180 tablet    Refill:  1  . atorvastatin  (LIPITOR) 40 MG tablet    Sig: Take 1 tablet (40 mg total) by mouth daily.    Dispense:  90 tablet    Refill:  1    Problem List Items Addressed This Visit      Unprioritized   Diabetes mellitus type II, uncontrolled ([place])    hgba1c to be checked , minimize simple carbs. Increase exercise as tolerated. Continue current meds      Relevant Medications   metFORMIN (GLUCOPHAGE) 500 MG tablet   lisinopril-hydrochlorothiazide (ZESTORETIC) 20-12.5 MG tablet   atorvastatin (LIPITOR) 40 MG tablet   Other Relevant Orders   Hemoglobin A1c  Comprehensive metabolic panel   Essential hypertension    Well controlled, no changes to meds. Encouraged heart healthy diet such as the DASH diet and exercise as tolerated.       Relevant Medications   fenofibrate 160 MG tablet   lisinopril-hydrochlorothiazide (ZESTORETIC) 20-12.5 MG tablet   atorvastatin (LIPITOR) 40 MG tablet   Other Relevant Orders   Lipid panel   Comprehensive metabolic panel   HYPERCALCEMIA   Relevant Orders   PTH, intact (no Ca)   Hyperlipidemia    Encouraged heart healthy diet, increase exercise, avoid trans fats, consider a krill oil cap daily      Relevant Medications   fenofibrate 160 MG tablet   lisinopril-hydrochlorothiazide (ZESTORETIC) 20-12.5 MG tablet   atorvastatin (LIPITOR) 40 MG tablet   Other Relevant Orders   Lipid panel   Comprehensive metabolic panel    Other Visit Diagnoses    Obesity (BMI 30-39.9)    -  Primary   Relevant Medications   metFORMIN (GLUCOPHAGE) 500 MG tablet   Other Relevant Orders   Amb Ref to Medical Weight Management   Depression, major, single episode, mild ([facility])       Relevant Medications   sertraline (ZOLOFT) 100 MG tablet      Follow-up: Return in about 3 months (around 12/14/2019), or if symptoms worsen or fail to improve, for hypertension, hyperlipidemia, diabetes II.  Ann Held, DO

## 2019-09-14 ENCOUNTER — Telehealth: Payer: Self-pay | Admitting: Internal Medicine

## 2019-09-14 LAB — LIPID PANEL
Cholesterol: 281 mg/dL — ABNORMAL HIGH (ref <200)
HDL: 64 mg/dL (ref >=50)
LDL Cholesterol (Calc): 172 mg/dL (calc) — ABNORMAL HIGH
Non-HDL Cholesterol (Calc): 217 mg/dL (calc) — ABNORMAL HIGH (ref <130)
Total CHOL/HDL Ratio: 4.4 (calc) (ref <5.0)
Triglycerides: 274 mg/dL — ABNORMAL HIGH (ref <150)

## 2019-09-14 LAB — COMPREHENSIVE METABOLIC PANEL
AG Ratio: 1.5 (calc) (ref 1.0–2.5)
ALT: 17 U/L (ref 6–29)
AST: 14 U/L (ref 10–35)
Albumin: 4.3 g/dL (ref 3.6–5.1)
Alkaline phosphatase (APISO): 101 U/L (ref 37–153)
BUN/Creatinine Ratio: 23 (calc) — ABNORMAL HIGH (ref 6–22)
BUN: 43 mg/dL — ABNORMAL HIGH (ref 7–25)
CO2: 24 mmol/L (ref 20–32)
Calcium: 13.3 mg/dL (ref 8.6–10.4)
Chloride: 104 mmol/L (ref 98–110)
Creat: 1.88 mg/dL — ABNORMAL HIGH (ref 0.50–0.99)
Globulin: 2.8 g/dL (calc) (ref 1.9–3.7)
Glucose, Bld: 119 mg/dL — ABNORMAL HIGH (ref 65–99)
Potassium: 4.5 mmol/L (ref 3.5–5.3)
Sodium: 134 mmol/L — ABNORMAL LOW (ref 135–146)
Total Bilirubin: 0.5 mg/dL (ref 0.2–1.2)
Total Protein: 7.1 g/dL (ref 6.1–8.1)

## 2019-09-14 LAB — HEMOGLOBIN A1C
Hgb A1c MFr Bld: 6.6 % of total Hgb — ABNORMAL HIGH (ref <5.7)
Mean Plasma Glucose: 143 (calc)
eAG (mmol/L): 7.9 (calc)

## 2019-09-14 NOTE — Telephone Encounter (Signed)
PC about critical lab Calcium 13.3 Records reviewed---last was 12.3 and elevated PTH in 2018. Was seen yesterday and no apparent worrisome symptoms---thus not an emergency (and probably primary hyperparathyroidism). Awaiting repeat PTH Can wait for Dr Carollee Herter

## 2019-09-17 LAB — PARATHYROID HORMONE, INTACT (NO CA): PTH: 309 pg/mL — ABNORMAL HIGH (ref 14–64)

## 2019-09-17 NOTE — Telephone Encounter (Signed)
Pt with moderate hypercalcemia in setting of being asymptomatic. No need for immediate therapy, though she should follow up early next week w Dr. Etter Sjogren or later this week if no openings with Dr. Etter Sjogren. Plz sched. Ty.

## 2019-09-17 NOTE — Telephone Encounter (Signed)
Pth was very elevated so endo referral placed

## 2019-09-18 ENCOUNTER — Other Ambulatory Visit: Payer: Self-pay

## 2019-09-18 DIAGNOSIS — E210 Primary hyperparathyroidism: Secondary | ICD-10-CM

## 2019-09-18 NOTE — Telephone Encounter (Signed)
Referral placed today

## 2019-09-19 ENCOUNTER — Telehealth: Payer: Self-pay | Admitting: Family Medicine

## 2019-09-19 NOTE — Telephone Encounter (Signed)
Caller: Ellanor Call back phone number: [phone]  Patient would like lab results

## 2019-09-19 NOTE — Telephone Encounter (Signed)
See new phone note

## 2019-09-19 NOTE — Telephone Encounter (Signed)
Spoke with patient. Attempted to go over labs and explain referral due to [facility] not reviewing labs. Also advised patient to schedule follow up with [place]. Pt states she will call back to schedule.

## 2019-11-12 ENCOUNTER — Ambulatory Visit (INDEPENDENT_AMBULATORY_CARE_PROVIDER_SITE_OTHER): Payer: Self-pay | Admitting: Internal Medicine

## 2019-11-12 ENCOUNTER — Other Ambulatory Visit: Payer: Self-pay

## 2019-11-12 ENCOUNTER — Encounter: Payer: Self-pay | Admitting: Internal Medicine

## 2019-11-12 VITALS — BP 136/86 | HR 77 | Ht 71.0 in | Wt 220.8 lb

## 2019-11-12 DIAGNOSIS — E210 Primary hyperparathyroidism: Secondary | ICD-10-CM

## 2019-11-12 NOTE — Patient Instructions (Addendum)
-   Encourage hydration  - Avoid over the counter calcium  - Maintain 2-3 servings of low fat dairy       24-Hour Urine Collection   You will be collecting your urine for a 24-hour period of time.  Your timer starts with your first urine of the morning (For example - If you first pee at [place], your timer will start at [place])  [place] away your first urine of the morning  Collect your urine every time you pee for the next 24 hours  STOP your urine collection 24 hours after you started the collection (For example - You would stop at 8AM the day after you started)   [place] [facility]  at [phone] to schedule a bone density   Located at [address] [place] , [place]

## 2019-11-12 NOTE — Progress Notes (Signed)
Name: Deborah Figueroa  MRN/ DOB: [id number], 10-02-1955    Age/ Sex: 64 y.o., female    PCP: Carollee Herter, Alferd Apa, DO   Reason for Endocrinology Evaluation: Hypercalcemia      Date of Initial Endocrinology Evaluation: 09/14/5007     HPI: Ms. Deborah Figueroa is a 64 y.o. female with a past medical history of CKD, T2DM and HTN. The patient presented for initial endocrinology clinic visit on 11/12/2019 for consultative assistance with her Hypercalcemia   Ms. Deborah Figueroa indicates that she was first diagnosed with hypercalcemia in 2009. Since that time, she has experienced symptoms of constipation, polyuria, polydipsia, generalized weakness, diffuse muscle pains. She is not on  over the counter calcium (including supplements, Tums, Rolaids, or other calcium containing antacids),or  lithium and no vitamin D The pt is on  HCTZ  She denies  history of kidney stones, liver disease, granulomatous disease but has CKD . She denies  osteoporosis , has a hx of right ankle fracture falling down the stairs around 19 yrs of age . Daily dietary calcium intake: 2 servings . She has a daughter with thyroid disease,  family history of osteoporosis ( mother), parathyroid disease    HISTORY:  Past Medical History:  Past Medical History:  Diagnosis Date   Cellulitis    Depression    Hyperlipidemia    Hypertension    Seasonal allergies    Past Surgical History:  Past Surgical History:  Procedure Laterality Date   BACK SURGERY     BREAST SURGERY     lumpectomy left breast   LUMBAR FUSION        Social History:  reports that she has never smoked. She has never used smokeless tobacco. She reports that she does not drink alcohol and does not use drugs.  Family History: family history includes Cancer in her maternal aunt and maternal grandmother; Cancer (age of onset: 22) in her father; Depression in her father; Diabetes in her brother; Hyperlipidemia in her mother; Hypertension in her  mother; Stroke in her mother.   HOME MEDICATIONS: Allergies as of 11/12/2019   No Known Allergies     Medication List       Accurate as of November 12, 2019  1:30 PM. If you have any questions, ask your nurse or doctor.        STOP taking these medications   furosemide 40 MG tablet Commonly known as: LASIX Stopped by: Dorita Sciara, MD     TAKE these medications   atorvastatin 40 MG tablet Commonly known as: LIPITOR Take 1 tablet (40 mg total) by mouth daily.   fenofibrate 160 MG tablet Take 1 tablet (160 mg total) by mouth daily.   lisinopril-hydrochlorothiazide 20-12.5 MG tablet Commonly known as: ZESTORETIC Take 2 tablets by mouth daily.   metFORMIN 500 MG tablet Commonly known as: GLUCOPHAGE 1 po bid   metoprolol succinate 100 MG 24 hr tablet Commonly known as: TOPROL-XL TAKE ONE TABLET BY MOUTH DAILY WITH OR IMMEDIATELY FOLLOWING A MEAL   onetouch ultrasoft lancets Use as instructed   OneTouch Verio test strip Generic drug: glucose blood USE AS DIRECTED TO CHECK BLOOD SUGAR ONCE DAILY   sertraline 100 MG tablet Commonly known as: ZOLOFT TAKE ONE TABLET BY MOUTH DAILY (NEED OFFICE VISIT FOR FOLLOW UP)         REVIEW OF SYSTEMS: A comprehensive ROS was conducted with the patient and is negative except as per HPI  OBJECTIVE:  VS: BP 136/86 (BP Location: Right Arm, Patient Position: Sitting, Cuff Size: Large)    Pulse 77    Ht '5\' 11"'  (1.803 m)    Wt 220 lb 12.8 oz (100.2 kg)    SpO2 98%    BMI 30.80 kg/m    Wt Readings from Last 3 Encounters:  11/12/19 220 lb 12.8 oz (100.2 kg)  09/13/19 221 lb 6.4 oz (100.4 kg)  02/09/19 185 lb (83.9 kg)     EXAM: General: Pt appears well and is in NAD  Neck: General: Supple without adenopathy. Thyroid: Thyroid size normal.  No goiter or nodules appreciated. No thyroid bruit.  Lungs: Clear with good BS bilat with no rales, rhonchi, or wheezes  Heart: Auscultation: RRR.  Abdomen: Normoactive bowel  sounds, soft, nontender, without masses or organomegaly palpable  Extremities:  BL LE: No pretibial edema normal ROM and strength.  Skin: Hair: Texture and amount normal with gender appropriate distribution Skin Inspection: No rashes Skin Palpation: Skin temperature, texture, and thickness normal to palpation  Neuro: Cranial nerves: II - XII grossly intact  Motor: Normal strength throughout DTRs: 2+ and symmetric in UE without delay in relaxation phase  Mental Status: Judgment, insight: Intact Orientation: Oriented to time, place, and person Mood and affect: Anxious      DATA REVIEWED:   Results for MEKLIT, COTTA (MRN [id number]) as of 11/13/2019 09:28  Ref. Range 11/12/2019 14:19  Sodium Latest Ref Range: 135 - 146 mmol/L 137  Potassium Latest Ref Range: 3.5 - 5.3 mmol/L 4.2  Chloride Latest Ref Range: 98 - 110 mmol/L 107  CO2 Latest Ref Range: 20 - 32 mmol/L 20  Glucose Latest Ref Range: 65 - 99 mg/dL 91  BUN Latest Ref Range: 7 - 25 mg/dL 40 (H)  Creatinine Latest Ref Range: 0.50 - 0.99 mg/dL 2.06 (H)  Calcium Latest Ref Range: 8.6 - 10.4 mg/dL 12.6 (H)  BUN/Creatinine Ratio Latest Ref Range: 6 - 22 (calc) 19  Vitamin D, 25-Hydroxy Latest Ref Range: 30 - 100 ng/mL 14 (L)    ASSESSMENT/PLAN/RECOMMENDATIONS:   1. Primary Hyperparathyroidism :  - Pt is symptomatic  - Corrected serum calcium 12.52 mg/dL  - Pt meets criteria  For surgical intervention based on Serum calcium concentration of 1.0 mg/dL or more above the upper limit of normal, and an Estimated glomerular filtration rate (eGFR) <60 mL/min - We will proceed with DXA scan for osteoporosis screen  - Will also proceed with 24-hr urine calcium collection    In the mean time she was encouraged of the following   - Encouraged hydration  - AVOID CALCIUM SUPPLEMENTS, AVOID LOW CALCIUM DIET - Maintain normal dietary calcium intake (2-3 servings of dairy a day)   2. Vitamin D Deficiency:   I am going to advise her  to start OTC Vitamin D3 2000 iu daily. I am going to avoid the high dose ergocalciferol , not to exacerbate her hypercalcemia.       F/U in 3 months    Addendum: Left a voice mail on her voice mail on 11/13/2019 . A letter was sent on 11/14/2019 as the pt did not return the call yet.   Signed electronically by: Mack Guise, MD  [facility] Endocrinology  [facility] [address] [place], [place] [address] Phone: [phone] FAX: [phone]   CC: Claudette Laws [address] [place] [address] Phone: [phone] Fax: [phone]  Return to Endocrinology clinic as below: No future appointments.

## 2019-11-13 ENCOUNTER — Telehealth: Payer: Self-pay | Admitting: Internal Medicine

## 2019-11-13 LAB — BASIC METABOLIC PANEL
BUN/Creatinine Ratio: 19 (calc) (ref 6–22)
BUN: 40 mg/dL — ABNORMAL HIGH (ref 7–25)
CO2: 20 mmol/L (ref 20–32)
Calcium: 12.6 mg/dL — ABNORMAL HIGH (ref 8.6–10.4)
Chloride: 107 mmol/L (ref 98–110)
Creat: 2.06 mg/dL — ABNORMAL HIGH (ref 0.50–0.99)
Glucose, Bld: 91 mg/dL (ref 65–99)
Potassium: 4.2 mmol/L (ref 3.5–5.3)
Sodium: 137 mmol/L (ref 135–146)

## 2019-11-13 LAB — VITAMIN D 25 HYDROXY (VIT D DEFICIENCY, FRACTURES): Vit D, 25-Hydroxy: 14 ng/mL — ABNORMAL LOW (ref 30–100)

## 2019-11-13 LAB — PARATHYROID HORMONE, INTACT (NO CA): PTH: 536 pg/mL — ABNORMAL HIGH (ref 14–64)

## 2019-11-13 LAB — ALBUMIN: Albumin: 4.1 g/dL (ref 3.6–5.1)

## 2019-11-13 NOTE — Telephone Encounter (Signed)
Left a message on the pt's voice mail to call  back to discuss results on 11/13/2019 at 9:40 AM     [place], MD  [facility] Endocrinology  [facility] [address] [place], [place] [address] Phone: [phone] FAX: [phone]

## 2019-11-14 ENCOUNTER — Encounter: Payer: Self-pay | Admitting: Internal Medicine

## 2019-11-15 ENCOUNTER — Other Ambulatory Visit: Payer: Self-pay

## 2019-11-15 ENCOUNTER — Other Ambulatory Visit (INDEPENDENT_AMBULATORY_CARE_PROVIDER_SITE_OTHER): Payer: Self-pay

## 2019-11-15 DIAGNOSIS — E210 Primary hyperparathyroidism: Secondary | ICD-10-CM

## 2019-11-16 LAB — CREATININE, URINE, 24 HOUR: Creatinine, 24H Ur: 1.13 g/(24.h) (ref 0.50–2.15)

## 2019-11-16 LAB — CALCIUM, URINE, 24 HOUR: Calcium, 24H Urine: 76 mg/24 h

## 2019-11-20 ENCOUNTER — Telehealth: Payer: Self-pay | Admitting: Internal Medicine

## 2019-11-20 MED ORDER — GLIPIZIDE 5 MG PO TABS: 2.5000 mg | ORAL_TABLET | Freq: Every day | ORAL | 3 refills | Status: DC

## 2019-11-20 NOTE — Telephone Encounter (Signed)
Discussed 24-hr urine calcium test with the pt. Normal Calcium excretion this is due to HCTZ on board.    I would recommend proceeding with parathyroidectomy based on low GFR and serum calcium of > 1 digit above the normal.    Pt also advised to STOP Metformin due to low GFR of < 35  Will start glipizide instead.      Recommendations  STOP METFORMIN  Glipizide 5 mg, half a tablet before breakfast     Pt expressed understanding    Abby Nena Jordan, MD  [facility] Endocrinology  [facility] [address] [place],  [address] Phone: [phone] FAX: [phone]

## 2019-11-22 ENCOUNTER — Telehealth: Payer: Self-pay | Admitting: Internal Medicine

## 2019-11-22 NOTE — Telephone Encounter (Signed)
Medication: glipiZIDE (GLUCOTROL) 5 MG tablet [[id number]   Has the patient contacted their pharmacy? No. (If no, request that the patient contact the pharmacy for the refill.) (If yes, when and what did the pharmacy advise?)  Preferred Pharmacy (with phone number or street name): [facility] [address] [place] - [address]  [place], [place] [address]  Phone:  [phone] Fax:  [phone]  DEA #:  --  Agent: Please be advised that RX refills may take up to 3 business days. We ask that you follow-up with your pharmacy.   Pharmacy. Did not receive medication

## 2019-11-25 ENCOUNTER — Other Ambulatory Visit: Payer: Self-pay

## 2019-11-25 MED ORDER — GLIPIZIDE 5 MG PO TABS: 2.5000 mg | ORAL_TABLET | Freq: Every day | ORAL | 3 refills | Status: AC

## 2019-11-25 NOTE — Telephone Encounter (Signed)
Rx sent 

## 2019-11-28 LAB — HM DIABETES EYE EXAM

## 2020-01-14 ENCOUNTER — Ambulatory Visit (INDEPENDENT_AMBULATORY_CARE_PROVIDER_SITE_OTHER)
Admission: RE | Admit: 2020-01-14 | Discharge: 2020-01-14 | Disposition: A | Discharge status: Home / Self Care | Payer: Self-pay | Source: Ambulatory Visit | Attending: Internal Medicine | Admitting: Internal Medicine

## 2020-01-14 ENCOUNTER — Inpatient Hospital Stay: Admission: RE | Admit: 2020-01-14 | Payer: Self-pay | Source: Ambulatory Visit

## 2020-01-14 ENCOUNTER — Other Ambulatory Visit: Payer: Self-pay

## 2020-01-14 DIAGNOSIS — E210 Primary hyperparathyroidism: Secondary | ICD-10-CM

## 2020-01-20 ENCOUNTER — Encounter: Payer: Self-pay | Admitting: Internal Medicine

## 2020-02-26 ENCOUNTER — Telehealth: Payer: Self-pay | Admitting: Family Medicine

## 2020-02-26 NOTE — Telephone Encounter (Signed)
Patient states she pulled a back muscle and would like some flexeril to be called. Patient decline appt.    [facility] [address], [place] - [address]  [address] [place], [place] [address]  Phone:  [phone] Fax:  [phone]

## 2020-02-26 NOTE — Telephone Encounter (Signed)
Pt has not been here since July and medication is not on her med list. She will need a visit

## 2020-02-27 ENCOUNTER — Ambulatory Visit: Payer: Self-pay | Admitting: Family Medicine

## 2020-02-27 NOTE — Telephone Encounter (Signed)
Called patient to schedule appt. LVM

## 2020-02-29 ENCOUNTER — Other Ambulatory Visit: Payer: Self-pay

## 2020-02-29 ENCOUNTER — Encounter (HOSPITAL_COMMUNITY): Payer: Self-pay | Admitting: *Deleted

## 2020-02-29 ENCOUNTER — Ambulatory Visit (HOSPITAL_COMMUNITY)
Admission: EM | Admit: 2020-02-29 | Discharge: 2020-02-29 | Disposition: A | Discharge status: Home / Self Care | Payer: Worker's Compensation | Attending: Family Medicine | Admitting: Family Medicine

## 2020-02-29 DIAGNOSIS — R42 Dizziness and giddiness: Secondary | ICD-10-CM | POA: Diagnosis not present

## 2020-02-29 DIAGNOSIS — S0083XA Contusion of other part of head, initial encounter: Secondary | ICD-10-CM

## 2020-02-29 MED ORDER — CYCLOBENZAPRINE HCL 5 MG PO TABS: 2.5000 mg | ORAL_TABLET | Freq: Three times a day (TID) | ORAL | 0 refills | Status: DC | PRN

## 2020-02-29 MED ORDER — CYCLOBENZAPRINE HCL 5 MG PO TABS: 2.5000 mg | ORAL_TABLET | Freq: Three times a day (TID) | ORAL | 0 refills | Status: AC | PRN

## 2020-02-29 NOTE — Discharge Instructions (Signed)
Go to the ER immediately if your symptoms worsen.

## 2020-02-29 NOTE — ED Triage Notes (Signed)
Pt reports she fell after slipping on water that was on the floor . Pt Fell and hit the back of the head . Pt has a raised area on the back head. Initially did not reports feeling dizzy. During triage Pt reported she felt dizzy. Pt A/O x 4.

## 2020-03-01 ENCOUNTER — Encounter (HOSPITAL_COMMUNITY): Payer: Self-pay | Admitting: Emergency Medicine

## 2020-03-01 ENCOUNTER — Emergency Department (HOSPITAL_COMMUNITY)
Admission: EM | Admit: 2020-03-01 | Discharge: 2020-03-01 | Disposition: A | Discharge status: Home / Self Care | Payer: No Typology Code available for payment source | Attending: Emergency Medicine | Admitting: Emergency Medicine

## 2020-03-01 DIAGNOSIS — M25512 Pain in left shoulder: Secondary | ICD-10-CM | POA: Insufficient documentation

## 2020-03-01 DIAGNOSIS — R519 Headache, unspecified: Secondary | ICD-10-CM | POA: Diagnosis present

## 2020-03-01 DIAGNOSIS — Y990 Civilian activity done for income or pay: Secondary | ICD-10-CM | POA: Insufficient documentation

## 2020-03-01 DIAGNOSIS — I10 Essential (primary) hypertension: Secondary | ICD-10-CM | POA: Diagnosis not present

## 2020-03-01 DIAGNOSIS — W01198A Fall on same level from slipping, tripping and stumbling with subsequent striking against other object, initial encounter: Secondary | ICD-10-CM | POA: Insufficient documentation

## 2020-03-01 DIAGNOSIS — Z7984 Long term (current) use of oral hypoglycemic drugs: Secondary | ICD-10-CM | POA: Diagnosis not present

## 2020-03-01 DIAGNOSIS — E1159 Type 2 diabetes mellitus with other circulatory complications: Secondary | ICD-10-CM | POA: Diagnosis not present

## 2020-03-01 DIAGNOSIS — Z79899 Other long term (current) drug therapy: Secondary | ICD-10-CM | POA: Insufficient documentation

## 2020-03-01 DIAGNOSIS — T148XXA Other injury of unspecified body region, initial encounter: Secondary | ICD-10-CM

## 2020-03-01 DIAGNOSIS — S0990XA Unspecified injury of head, initial encounter: Secondary | ICD-10-CM

## 2020-03-01 MED ORDER — METHOCARBAMOL 750 MG PO TABS: 750.0000 mg | ORAL_TABLET | Freq: Four times a day (QID) | ORAL | 0 refills | Status: DC

## 2020-03-01 NOTE — ED Provider Notes (Signed)
[facility]    CSN: [id number] Arrival date & time: 02/29/20  1618      History   Chief Complaint Chief Complaint  Patient presents with  . Fall    HPI Deborah Figueroa is a 64 y.o. female.   Patient presenting today for evaluation of posterior head pain, dizziness after a fall this afternoon when she slipped on water and hit the back of her head onto the hard ground. Has a large goose egg on the back of her head and since arrival to clinic has begun to have dizziness and worsened head pain. Denies visual changes, LOC, memory changes, N/V. Has placed ice on the area of impact which has helped, otherwise has not tried anything for sxs. Also having b/l neck and upper back soreness from the fall, otherwise no apparent injury from incident. States she has had concussions in the past and states this feels similar to her.      Past Medical History:  Diagnosis Date  . Cellulitis   . Depression   . Hyperlipidemia   . Hypertension   . Seasonal allergies     Patient Active Problem List   Diagnosis Date Noted  . DM (diabetes mellitus) type II uncontrolled, periph vascular disorder ([place]) 01/12/2018  . Daytime somnolence 10/02/2017  . Diabetes mellitus type II, uncontrolled ([place]) 08/12/2016  . Toenail fungus 07/31/2012  . HYPERCALCEMIA 04/10/2009  . Hyperlipidemia 02/06/2008  . Depression, major, single episode, moderate ([place]) 02/06/2008  . Essential hypertension 02/06/2008    Past Surgical History:  Procedure Laterality Date  . BACK SURGERY    . BREAST SURGERY     lumpectomy left breast  . LUMBAR FUSION      OB History   No obstetric history on file.      Home Medications    Prior to Admission medications   Medication Sig Start Date End Date Taking? Authorizing Provider  atorvastatin (LIPITOR) 40 MG tablet Take 1 tablet (40 mg total) by mouth daily. 09/13/19  Yes Roma Schanz R, DO  fenofibrate 160 MG tablet Take 1 tablet (160 mg total) by mouth  daily. 09/13/19  Yes Roma Schanz R, DO  glipiZIDE (GLUCOTROL) 5 MG tablet Take 0.5 tablets (2.5 mg total) by mouth daily before breakfast. 11/25/19  Yes Shamleffer, Melanie Crazier, MD  glucose blood (ONETOUCH VERIO) test strip USE AS DIRECTED TO CHECK BLOOD SUGAR ONCE DAILY 07/31/19  Yes Carollee Herter, Kendrick Fries R, DO  Lancets [facility] ULTRASOFT) lancets Use as instructed 10/02/17  Yes Lowne Lyndal Pulley R, DO  lisinopril-hydrochlorothiazide (ZESTORETIC) 20-12.5 MG tablet Take 2 tablets by mouth daily. 09/13/19  Yes Roma Schanz R, DO  metoprolol succinate (TOPROL-XL) 100 MG 24 hr tablet TAKE ONE TABLET BY MOUTH DAILY WITH OR IMMEDIATELY FOLLOWING A MEAL 05/20/19  Yes Roma Schanz R, DO  sertraline (ZOLOFT) 100 MG tablet TAKE ONE TABLET BY MOUTH DAILY (NEED OFFICE VISIT FOR FOLLOW UP) 09/13/19  Yes Ann Held, DO  cyclobenzaprine (FLEXERIL) 5 MG tablet Take 0.5-1 tablets (2.5-5 mg total) by mouth 3 (three) times daily as needed for muscle spasms. DO NOT DRINK ALCOHOL OR DRIVE WHILE TAKING THIS MEDICATION 02/29/20   Volney American, PA-C  methocarbamol (ROBAXIN-750) 750 MG tablet Take 1 tablet (750 mg total) by mouth 4 (four) times daily. 03/01/20   Lacretia Leigh, MD    Family History Family History  Problem Relation Age of Onset  . Stroke Mother   . Hypertension Mother   .  Hyperlipidemia Mother   . Cancer Father 56       liver and colon  . Depression Father   . Cancer Maternal Aunt        breast  . Diabetes Brother   . Cancer Maternal Grandmother        breast    Social History Social History   Tobacco Use  . Smoking status: Never Smoker  . Smokeless tobacco: Never Used  Vaping Use  . Vaping Use: Never used  Substance Use Topics  . Alcohol use: No  . Drug use: No     Allergies   Patient has no known allergies.   Review of Systems Review of Systems PER HPI    Physical Exam Triage Vital Signs ED Triage Vitals  Enc Vitals Group     BP  02/29/20 1651 140/84     Pulse Rate 02/29/20 1651 73     Resp 02/29/20 1651 18     Temp 02/29/20 1651 97.9 F (36.6 C)     Temp Source 02/29/20 1651 Oral     SpO2 02/29/20 1651 100 %     Weight 02/29/20 1657 198 lb (89.8 kg)     Height 02/29/20 1657 5\' 11"  (1.803 m)     Head Circumference --      Peak Flow --      Pain Score 02/29/20 1656 9     Pain Loc --      Pain Edu? --      Excl. in [place]? --    No data found.  Updated Vital Signs BP 140/84 (BP Location: Right Arm)   Pulse 73   Temp 97.9 F (36.6 C) (Oral)   Resp 18   Ht 5\' 11"  (1.803 m)   Wt 198 lb (89.8 kg)   SpO2 100%   BMI 27.62 kg/m   Visual Acuity Right Eye Distance:   Left Eye Distance:   Bilateral Distance:    Right Eye Near:   Left Eye Near:    Bilateral Near:     Physical Exam Vitals and nursing note reviewed.  Constitutional:      Appearance: Normal appearance. She is not ill-appearing.  HENT:     Head: Atraumatic.  Eyes:     Extraocular Movements: Extraocular movements intact.     Conjunctiva/sclera: Conjunctivae normal.  Cardiovascular:     Rate and Rhythm: Normal rate and regular rhythm.     Heart sounds: Normal heart sounds.  Pulmonary:     Effort: Pulmonary effort is normal.     Breath sounds: Normal breath sounds.  Musculoskeletal:        General: Tenderness (b/l cervical paraspinal muscles ttp and contracted) present. Normal range of motion.     Cervical back: Normal range of motion and neck supple.     Comments: No midline c spine ttp, able to touch chin to chest and extend head backward.   Skin:    General: Skin is warm and dry.     Comments: Hematoma forming posterior head, ttp  Neurological:     General: No focal deficit present.     Mental Status: She is alert and oriented to person, place, and time.     Motor: No weakness or pronator drift.     Coordination: Romberg sign negative. Coordination normal.     Gait: Gait normal.  Psychiatric:        Mood and Affect: Mood normal.         Thought Content: Thought  content normal.        Judgment: Judgment normal.      UC Treatments / Results  Labs (all labs ordered are listed, but only abnormal results are displayed) Labs Reviewed - No data to display  EKG   Radiology No results found.  Procedures Procedures (including critical care time)  Medications Ordered in UC Medications - No data to display  Initial Impression / Assessment and Plan / UC Course  I have reviewed the triage vital signs and the nursing notes.  Pertinent labs & imaging results that were available during my care of the patient were reviewed by me and considered in my medical decision making (see chart for details).     Neurologic exam intact, no obvious bony injuries, overall well appearing today but given her developing dizziness do strongly recommend she go to the ED for further evaluation. She initially was agreeable to this but ultimately has decided to go home to rest and take herself to ED if worsening at any time. Will give very low dose flexeril for her muscle stiffness but discussed sparing use so she can closely monitor her mental status for changes. Strict ED precautions reviewed, work note given.   Final Clinical Impressions(s) / UC Diagnoses   Final diagnoses:  Contusion of other part of head, initial encounter  Lightheadedness     Discharge Instructions     Go to the ER immediately if your symptoms worsen.     ED Prescriptions    Medication Sig Dispense Auth. Provider   cyclobenzaprine (FLEXERIL) 5 MG tablet  (Status: Discontinued) Take 0.5-1 tablets (2.5-5 mg total) by mouth 3 (three) times daily as needed for muscle spasms. DO NOT DRINK ALCOHOL OR DRIVE WHILE TAKING THIS MEDICATION 14 tablet Volney American, PA-C   cyclobenzaprine (FLEXERIL) 5 MG tablet Take 0.5-1 tablets (2.5-5 mg total) by mouth 3 (three) times daily as needed for muscle spasms. DO NOT DRINK ALCOHOL OR DRIVE WHILE TAKING THIS  MEDICATION 14 tablet Volney American, [place]     PDMP not reviewed this encounter.   Merrie Roof [place], [place] 03/02/20 [phone]

## 2020-03-01 NOTE — ED Provider Notes (Signed)
[facility] DEPT Provider Note   CSN: [id number] Arrival date & time: 03/01/20  1218     History Chief Complaint  Patient presents with  . Fall    Deborah Figueroa is a 64 y.o. female.  64 year old female presents complaining of occipital head pain after a mechanical fall yesterday.  No LOC.  No complaints of neck pain.  Does have some left posterior scapular pain.  Denies any weakness in arms or legs.  Went to urgent care for evaluation and they recommended further evaluation in ER.  This occurred yesterday and since that time she has had no headache, emesis, visual changes.  She does not take any blood thinners.  Denies any gait ataxia.        Past Medical History:  Diagnosis Date  . Cellulitis   . Depression   . Hyperlipidemia   . Hypertension   . Seasonal allergies     Patient Active Problem List   Diagnosis Date Noted  . DM (diabetes mellitus) type II uncontrolled, periph vascular disorder ([place]) 01/12/2018  . Daytime somnolence 10/02/2017  . Diabetes mellitus type II, uncontrolled ([place]) 08/12/2016  . Toenail fungus 07/31/2012  . HYPERCALCEMIA 04/10/2009  . Hyperlipidemia 02/06/2008  . Depression, major, single episode, moderate ([place]) 02/06/2008  . Essential hypertension 02/06/2008    Past Surgical History:  Procedure Laterality Date  . BACK SURGERY    . BREAST SURGERY     lumpectomy left breast  . LUMBAR FUSION       OB History   No obstetric history on file.     Family History  Problem Relation Age of Onset  . Stroke Mother   . Hypertension Mother   . Hyperlipidemia Mother   . Cancer Father 57       liver and colon  . Depression Father   . Cancer Maternal Aunt        breast  . Diabetes Brother   . Cancer Maternal Grandmother        breast    Social History   Tobacco Use  . Smoking status: Never Smoker  . Smokeless tobacco: Never Used  Vaping Use  . Vaping Use: Never used  Substance Use Topics  .  Alcohol use: No  . Drug use: No    Home Medications Prior to Admission medications   Medication Sig Start Date End Date Taking? Authorizing Provider  atorvastatin (LIPITOR) 40 MG tablet Take 1 tablet (40 mg total) by mouth daily. 09/13/19   Ann Held, DO  cyclobenzaprine (FLEXERIL) 5 MG tablet Take 0.5-1 tablets (2.5-5 mg total) by mouth 3 (three) times daily as needed for muscle spasms. DO NOT DRINK ALCOHOL OR DRIVE WHILE TAKING THIS MEDICATION 02/29/20   Volney American, PA-C  fenofibrate 160 MG tablet Take 1 tablet (160 mg total) by mouth daily. 09/13/19   Ann Held, DO  glipiZIDE (GLUCOTROL) 5 MG tablet Take 0.5 tablets (2.5 mg total) by mouth daily before breakfast. 11/25/19   Shamleffer, Melanie Crazier, MD  glucose blood (ONETOUCH VERIO) test strip USE AS DIRECTED TO CHECK BLOOD SUGAR ONCE DAILY 07/31/19   Carollee Herter, Alferd Apa, DO  Lancets [facility] ULTRASOFT) lancets Use as instructed 10/02/17   Carollee Herter, Alferd Apa, DO  lisinopril-hydrochlorothiazide (ZESTORETIC) 20-12.5 MG tablet Take 2 tablets by mouth daily. 09/13/19   Roma Schanz R, DO  metoprolol succinate (TOPROL-XL) 100 MG 24 hr tablet TAKE ONE TABLET BY MOUTH DAILY WITH OR IMMEDIATELY  FOLLOWING A MEAL 05/20/19   Carollee Herter, Kendrick Fries R, DO  sertraline (ZOLOFT) 100 MG tablet TAKE ONE TABLET BY MOUTH DAILY (NEED OFFICE VISIT FOR FOLLOW UP) 09/13/19   Ann Held, DO    Allergies    Patient has no known allergies.  Review of Systems   Review of Systems  All other systems reviewed and are negative.   Physical Exam Updated Vital Signs BP 134/88   Pulse 83   Temp 98.5 F (36.9 C) (Oral)   Resp 18   SpO2 100%   Physical Exam Vitals and nursing note reviewed.  Constitutional:      General: She is not in acute distress.    Appearance: Normal appearance. She is well-developed and well-nourished. She is not toxic-appearing.  HENT:     Head:      Comments: Hematoma noted Eyes:      General: Lids are normal.     Extraocular Movements: EOM normal.     Conjunctiva/sclera: Conjunctivae normal.     Pupils: Pupils are equal, round, and reactive to light.  Neck:     Thyroid: No thyroid mass.     Trachea: No tracheal deviation.  Cardiovascular:     Rate and Rhythm: Normal rate and regular rhythm.     Heart sounds: Normal heart sounds. No murmur heard. No gallop.   Pulmonary:     Effort: Pulmonary effort is normal. No respiratory distress.     Breath sounds: Normal breath sounds. No stridor. No decreased breath sounds, wheezing, rhonchi or rales.  Abdominal:     General: Bowel sounds are normal. There is no distension.     Palpations: Abdomen is soft.     Tenderness: There is no abdominal tenderness. There is no CVA tenderness or rebound.  Musculoskeletal:        General: No tenderness or edema. Normal range of motion.     Cervical back: Normal range of motion and neck supple.       Back:  Skin:    General: Skin is warm and dry.     Findings: No abrasion or rash.  Neurological:     General: No focal deficit present.     Mental Status: She is alert and oriented to person, place, and time.     GCS: GCS eye subscore is 4. GCS verbal subscore is 5. GCS motor subscore is 6.     Cranial Nerves: No cranial nerve deficit.     Sensory: No sensory deficit.     Gait: Gait is intact.     Deep Tendon Reflexes: Strength normal.  Psychiatric:        Mood and Affect: Mood and affect normal.        Speech: Speech normal.        Behavior: Behavior normal.     ED Results / Procedures / Treatments   Labs (all labs ordered are listed, but only abnormal results are displayed) Labs Reviewed - No data to display  EKG None  Radiology No results found.  Procedures Procedures (including critical care time)  Medications Ordered in ED Medications - No data to display  ED Course  I have reviewed the triage vital signs and the nursing notes.  Pertinent labs & imaging  results that were available during my care of the patient were reviewed by me and considered in my medical decision making (see chart for details).    MDM Rules/Calculators/A&P  Patient without indication for intracranial imaging at this time.  Will prescribe Robaxin return precautions given.  Patient with likely concussion and muscle strain Final Clinical Impression(s) / ED Diagnoses Final diagnoses:  None    Rx / DC Orders ED Discharge Orders    None       Lacretia Leigh, MD 03/01/20 1331

## 2020-03-01 NOTE — ED Triage Notes (Signed)
Patient reports fall at work yesterday hitting posterior head on cement floor. Denies LOC and taking blood thinners. Ambulatory.

## 2020-03-13 ENCOUNTER — Other Ambulatory Visit: Payer: Self-pay | Admitting: Family Medicine

## 2020-03-13 DIAGNOSIS — E785 Hyperlipidemia, unspecified: Secondary | ICD-10-CM

## 2020-03-13 DIAGNOSIS — F320 Major depressive disorder, single episode, mild: Secondary | ICD-10-CM

## 2020-03-13 DIAGNOSIS — I10 Essential (primary) hypertension: Secondary | ICD-10-CM

## 2020-03-17 ENCOUNTER — Telehealth: Payer: Self-pay

## 2020-03-17 NOTE — Telephone Encounter (Signed)
Caller states she wants to make an appointment with Dr. Etter Sjogren. She had a real bad fall two weeks ago, hurt her back and elbow. Needs a check up as well and to get her medicines.  Telephone: [phone]

## 2020-03-18 ENCOUNTER — Ambulatory Visit: Payer: Self-pay | Admitting: Medical

## 2020-03-18 DIAGNOSIS — Z0289 Encounter for other administrative examinations: Secondary | ICD-10-CM

## 2020-03-19 ENCOUNTER — Ambulatory Visit (HOSPITAL_BASED_OUTPATIENT_CLINIC_OR_DEPARTMENT_OTHER)
Admission: RE | Admit: 2020-03-19 | Discharge: 2020-03-19 | Disposition: A | Discharge status: Home / Self Care | Payer: Self-pay | Source: Ambulatory Visit | Attending: Family Medicine | Admitting: Family Medicine

## 2020-03-19 ENCOUNTER — Other Ambulatory Visit: Payer: Self-pay

## 2020-03-19 ENCOUNTER — Ambulatory Visit (INDEPENDENT_AMBULATORY_CARE_PROVIDER_SITE_OTHER): Payer: 59 | Admitting: Family Medicine

## 2020-03-19 ENCOUNTER — Telehealth: Payer: Self-pay | Admitting: Family Medicine

## 2020-03-19 ENCOUNTER — Telehealth: Payer: Self-pay

## 2020-03-19 ENCOUNTER — Other Ambulatory Visit: Payer: Self-pay | Admitting: Family Medicine

## 2020-03-19 VITALS — BP 136/82 | HR 125 | Temp 97.9°F | Ht 71.0 in | Wt 207.0 lb

## 2020-03-19 DIAGNOSIS — E1165 Type 2 diabetes mellitus with hyperglycemia: Secondary | ICD-10-CM

## 2020-03-19 DIAGNOSIS — M25511 Pain in right shoulder: Secondary | ICD-10-CM

## 2020-03-19 DIAGNOSIS — M25522 Pain in left elbow: Secondary | ICD-10-CM | POA: Insufficient documentation

## 2020-03-19 DIAGNOSIS — E785 Hyperlipidemia, unspecified: Secondary | ICD-10-CM

## 2020-03-19 DIAGNOSIS — M545 Low back pain, unspecified: Secondary | ICD-10-CM | POA: Insufficient documentation

## 2020-03-19 DIAGNOSIS — S0990XA Unspecified injury of head, initial encounter: Secondary | ICD-10-CM | POA: Insufficient documentation

## 2020-03-19 DIAGNOSIS — M7022 Olecranon bursitis, left elbow: Secondary | ICD-10-CM

## 2020-03-19 DIAGNOSIS — I10 Essential (primary) hypertension: Secondary | ICD-10-CM

## 2020-03-19 DIAGNOSIS — F321 Major depressive disorder, single episode, moderate: Secondary | ICD-10-CM

## 2020-03-19 DIAGNOSIS — F320 Major depressive disorder, single episode, mild: Secondary | ICD-10-CM

## 2020-03-19 LAB — COMPREHENSIVE METABOLIC PANEL
ALT: 18 U/L (ref 0–35)
AST: 10 U/L (ref 0–37)
Albumin: 3.1 g/dL — ABNORMAL LOW (ref 3.5–5.2)
Alkaline Phosphatase: 125 U/L — ABNORMAL HIGH (ref 39–117)
BUN: 73 mg/dL — ABNORMAL HIGH (ref 6–23)
CO2: 21 mEq/L (ref 19–32)
Calcium: 13.9 mg/dL (ref 8.4–10.5)
Chloride: 92 mEq/L — ABNORMAL LOW (ref 96–112)
Creatinine, Ser: 2.17 mg/dL — ABNORMAL HIGH (ref 0.40–1.20)
GFR: 23.46 mL/min — ABNORMAL LOW (ref >60.00)
Glucose, Bld: 589 mg/dL (ref 70–99)
Potassium: 4.5 mEq/L (ref 3.5–5.1)
Sodium: 127 mEq/L — ABNORMAL LOW (ref 135–145)
Total Bilirubin: 0.6 mg/dL (ref 0.2–1.2)
Total Protein: 6.7 g/dL (ref 6.0–8.3)

## 2020-03-19 LAB — TSH: TSH: 0.28 u[IU]/mL — ABNORMAL LOW (ref 0.35–4.50)

## 2020-03-19 LAB — LIPID PANEL
Cholesterol: 150 mg/dL (ref 0–200)
HDL: 16.7 mg/dL — ABNORMAL LOW (ref >39.00)
NonHDL: 133.5
Total CHOL/HDL Ratio: 9
Triglycerides: 204 mg/dL — ABNORMAL HIGH (ref 0.0–149.0)
VLDL: 40.8 mg/dL — ABNORMAL HIGH (ref 0.0–40.0)

## 2020-03-19 LAB — LDL CHOLESTEROL, DIRECT: Direct LDL: 46 mg/dL

## 2020-03-19 LAB — HEMOGLOBIN A1C: Hgb A1c MFr Bld: 10.4 % — ABNORMAL HIGH (ref 4.6–6.5)

## 2020-03-19 MED ORDER — METOPROLOL SUCCINATE ER 100 MG PO TB24: ORAL_TABLET | ORAL | 1 refills | Status: AC

## 2020-03-19 MED ORDER — SERTRALINE HCL 100 MG PO TABS: 100.0000 mg | ORAL_TABLET | Freq: Every day | ORAL | 1 refills | Status: AC

## 2020-03-19 MED ORDER — ATORVASTATIN CALCIUM 40 MG PO TABS: 40.0000 mg | ORAL_TABLET | Freq: Every day | ORAL | 1 refills | Status: AC

## 2020-03-19 MED ORDER — LISINOPRIL-HYDROCHLOROTHIAZIDE 20-12.5 MG PO TABS: 2.0000 | ORAL_TABLET | Freq: Every day | ORAL | 2 refills | Status: AC

## 2020-03-19 MED ORDER — METHOCARBAMOL 750 MG PO TABS
750.0000 mg | ORAL_TABLET | Freq: Four times a day (QID) | ORAL | 0 refills | Status: AC
Start: 2020-03-19 — End: ?

## 2020-03-19 MED ORDER — INSULIN PEN NEEDLE 31G X 8 MM MISC: 2 refills | Status: AC

## 2020-03-19 MED ORDER — FENOFIBRATE 160 MG PO TABS: 160.0000 mg | ORAL_TABLET | Freq: Every day | ORAL | 1 refills | Status: AC

## 2020-03-19 MED ORDER — LANTUS SOLOSTAR 100 UNIT/ML ~~LOC~~ SOPN: 10.0000 [IU] | PEN_INJECTOR | Freq: Every day | SUBCUTANEOUS | 99 refills | Status: AC

## 2020-03-19 NOTE — Telephone Encounter (Addendum)
CRITICAL VALUE STICKER  CRITICAL VALUE:  Glucose 589    Calcium 13.9  RECEIVER (on-site recipient of call): Jafeth Mustin  DATE & TIME NOTIFIED: 03/19/2020 4:27pm  MESSENGER (representative from lab): [facility]  MD NOTIFIED: [place]  TIME OF NOTIFICATION: 4:28pm  RESPONSE: "She see's endo (Shamleffer) and is noncompliant w/ meds"

## 2020-03-19 NOTE — Patient Instructions (Signed)
Start taking the Vita D3  2000u daily  F/u with surgeon  We sill schedule an app with an orthopedic surgeon for your shoulder and elbow pain from the falls  We will get xrays and CT today

## 2020-03-19 NOTE — Telephone Encounter (Signed)
Caller Micheal from [facility]   Patient medicine are all  prescribe for 90 day except for   lisinopril-hydrochlorothiazide (ZESTORETIC) 20-12.5 MG tablet  Its only 18 tablets, he will like to know is that correct

## 2020-03-19 NOTE — Progress Notes (Signed)
Patient ID: CYBELE MAULE, female    DOB: 02/06/1956  Age: 65 y.o. MRN: [id number]    Subjective:  Subjective  HPI Deborah Figueroa presents for f/u falls---  Fall 12/ 18 and [facility] -- no tests were done -- she was given muscle relaxer --- the first time she slipped on a wet cement floor ---hit the back of her head--  No loc.   Then the second fall was 2 days ago at  [facility]---  She tripped and fell -- her balance was off.  She hit the R side of her head / face   No Loc   She c/o weakness  She also states she has not been taking her meds regularly  Pt also c/o L elbow and r shoulder pain after last fall.   L elbow is swollen and tender   Review of Systems  Constitutional: Negative for appetite change, diaphoresis, fatigue and unexpected weight change.  Eyes: Negative for pain, redness and visual disturbance.  Respiratory: Negative for cough, chest tightness, shortness of breath and wheezing.   Cardiovascular: Negative for chest pain, palpitations and leg swelling.  Endocrine: Negative for cold intolerance, heat intolerance, polydipsia, polyphagia and polyuria.  Genitourinary: Negative for difficulty urinating, dysuria and frequency.  Neurological: Positive for weakness and headaches. Negative for dizziness, light-headedness and numbness.  Hematological: Bruises/bleeds easily.    History Past Medical History:  Diagnosis Date   Cellulitis    Depression    Hyperlipidemia    Hypertension    Seasonal allergies     She has a past surgical history that includes Lumbar fusion; Breast surgery; and Back surgery.   Her family history includes Cancer in her maternal aunt and maternal grandmother; Cancer (age of onset: 48) in her father; Depression in her father; Diabetes in her brother; Hyperlipidemia in her mother; Hypertension in her mother; Stroke in her mother.She reports that she has never smoked. She has never used smokeless tobacco. She reports that  she does not drink alcohol and does not use drugs.  No current facility-administered medications on file prior to visit.   Current Outpatient Medications on File Prior to Visit  Medication Sig Dispense Refill   cyclobenzaprine (FLEXERIL) 5 MG tablet Take 0.5-1 tablets (2.5-5 mg total) by mouth 3 (three) times daily as needed for muscle spasms. DO NOT DRINK ALCOHOL OR DRIVE WHILE TAKING THIS MEDICATION 14 tablet 0   glipiZIDE (GLUCOTROL) 5 MG tablet Take 0.5 tablets (2.5 mg total) by mouth daily before breakfast. 30 tablet 3   glucose blood (ONETOUCH VERIO) test strip USE AS DIRECTED TO CHECK BLOOD SUGAR ONCE DAILY (Patient not taking: Reported on 04/12/2020) 100 each 10   Lancets (ONETOUCH ULTRASOFT) lancets Use as instructed 100 each 12     Objective:  Objective  Physical Exam Vitals and nursing note reviewed.  Constitutional:      Appearance: She is well-developed and well-nourished.  HENT:     Head: Normocephalic and atraumatic.  Eyes:     Extraocular Movements: EOM normal.     Conjunctiva/sclera: Conjunctivae normal.  Neck:     Thyroid: No thyromegaly.     Vascular: No carotid bruit or JVD.  Cardiovascular:     Rate and Rhythm: Normal rate and regular rhythm.     Heart sounds: Normal heart sounds. No murmur heard.   Pulmonary:     Effort: Pulmonary effort is normal. No respiratory distress.     Breath sounds:  Normal breath sounds. No wheezing or rales.  Chest:     Chest wall: No tenderness.  Musculoskeletal:        General: Tenderness present. No edema.     Right shoulder: Tenderness present. No swelling. Decreased strength.     Right elbow: Normal.     Left elbow: Swelling present. No effusion. Decreased range of motion. Tenderness present in olecranon process.     Cervical back: Normal range of motion and neck supple.  Neurological:     Mental Status: She is alert and oriented to person, place, and time.     Motor: No weakness.     Gait: Gait normal.     Deep  Tendon Reflexes: Reflexes normal.     Comments: Good strength in all 4 ext  But pt c/o fatigue and weakness   Psychiatric:        Mood and Affect: Mood and affect normal.    BP 136/82 (BP Location: Right Arm, Patient Position: Sitting, Cuff Size: Large)    Pulse (!) 125    Temp 97.9 F (36.6 C) (Oral)    Ht 5\' 11"  (1.803 m)    Wt 207 lb (93.9 kg)    SpO2 99%    BMI 28.87 kg/m  Wt Readings from Last 3 Encounters:  03/19/20 207 lb (93.9 kg)  02/29/20 198 lb (89.8 kg)  11/12/19 220 lb 12.8 oz (100.2 kg)     Lab Results  Component Value Date   WBC 18.9 (H) 03/22/2020   HGB 8.9 (L) 03/22/2020   HCT 27.6 (L) 03/22/2020   PLT 457 (H) 03/22/2020   GLUCOSE 334 (H) 03/22/2020   CHOL 150 03/19/2020   TRIG 204.0 (H) 03/19/2020   HDL 16.70 (L) 03/19/2020   LDLDIRECT 46.0 03/19/2020   LDLCALC 172 (H) 09/13/2019   ALT 18 03/19/2020   AST 10 03/19/2020   NA 136 03/22/2020   K 4.0 03/22/2020   CL 106 03/22/2020   CREATININE 1.65 (H) 03/22/2020   BUN 59 (H) 03/22/2020   CO2 21 (L) 03/22/2020   TSH 0.28 (L) 03/19/2020   HGBA1C 10.4 (H) 03/19/2020    No results found.   Assessment & Plan:  Plan  I have changed Apple B. Popp's metoprolol succinate. I am also having her maintain her onetouch ultrasoft, OneTouch Verio, glipiZIDE, cyclobenzaprine, sertraline, methocarbamol, lisinopril-hydrochlorothiazide, fenofibrate, and atorvastatin.  Meds ordered this encounter  Medications   sertraline (ZOLOFT) 100 MG tablet    Sig: Take 1 tablet (100 mg total) by mouth daily.    Dispense:  90 tablet    Refill:  1    Requested drug refills are authorized, however, the patient needs further evaluation and/or laboratory testing before further refills are given. Ask her to make an appointment for this.   metoprolol succinate (TOPROL-XL) 100 MG 24 hr tablet    Sig: Take with or immediately following a meal.    Dispense:  90 tablet    Refill:  1   methocarbamol (ROBAXIN-750) 750 MG tablet     Sig: Take 1 tablet (750 mg total) by mouth 4 (four) times daily.    Dispense:  30 tablet    Refill:  0   lisinopril-hydrochlorothiazide (ZESTORETIC) 20-12.5 MG tablet    Sig: Take 2 tablets by mouth daily.    Dispense:  18 tablet    Refill:  2    Requested drug refills are authorized, however, the patient needs further evaluation and/or laboratory testing before further refills are given.  Ask her to make an appointment for this.   fenofibrate 160 MG tablet    Sig: Take 1 tablet (160 mg total) by mouth daily.    Dispense:  90 tablet    Refill:  1   atorvastatin (LIPITOR) 40 MG tablet    Sig: Take 1 tablet (40 mg total) by mouth daily.    Dispense:  90 tablet    Refill:  1    Requested drug refills are authorized, however, the patient needs further evaluation and/or laboratory testing before further refills are given. Ask her to make an appointment for this.    Problem List Items Addressed This Visit      Unprioritized   Acute pain of right shoulder   Relevant Orders   DG Shoulder Right (Completed)   Depression, major, single episode, moderate (HCC)    Stable  Refill zoloft       Relevant Medications   sertraline (ZOLOFT) 100 MG tablet   Diabetes mellitus type II, uncontrolled ([place])    Check labs today Pt needs to make f/u app with endo       Relevant Medications   lisinopril-hydrochlorothiazide (ZESTORETIC) 20-12.5 MG tablet   atorvastatin (LIPITOR) 40 MG tablet   Essential hypertension    Well controlled, no changes to meds. Encouraged heart healthy diet such as the DASH diet and exercise as tolerated.  meds refiled for pt       Relevant Medications   metoprolol succinate (TOPROL-XL) 100 MG 24 hr tablet   lisinopril-hydrochlorothiazide (ZESTORETIC) 20-12.5 MG tablet   fenofibrate 160 MG tablet   atorvastatin (LIPITOR) 40 MG tablet   Other Relevant Orders   Lipid panel (Completed)   Comprehensive metabolic panel (Completed)   TSH (Completed)   Head trauma,  initial encounter    Check ct today due to frequent falls  Pt feels its because of the parathyroid--and running out of meds  We made sure pt had all her meds today and will check labs app with surgeon is on 1/12       Relevant Orders   CT Head Wo Contrast (Completed)   Hyperlipidemia    Tolerating statin, encouraged heart healthy diet, avoid trans fats, minimize simple carbs and saturated fats. Increase exercise as tolerated      Relevant Medications   metoprolol succinate (TOPROL-XL) 100 MG 24 hr tablet   lisinopril-hydrochlorothiazide (ZESTORETIC) 20-12.5 MG tablet   fenofibrate 160 MG tablet   atorvastatin (LIPITOR) 40 MG tablet   Other Relevant Orders   Lipid panel (Completed)   Comprehensive metabolic panel (Completed)   Olecranon bursitis of left elbow - Primary   Relevant Orders   Ambulatory referral to Orthopedic Surgery    Other Visit Diagnoses    Depression, major, single episode, mild (HCC)       Relevant Medications   sertraline (ZOLOFT) 100 MG tablet   Type 2 diabetes mellitus with hyperglycemia, without long-term current use of insulin (HCC)       Relevant Medications   lisinopril-hydrochlorothiazide (ZESTORETIC) 20-12.5 MG tablet   atorvastatin (LIPITOR) 40 MG tablet   Other Relevant Orders   Hemoglobin A1c (Completed)   Left elbow pain       Relevant Orders   DG Elbow Complete Left (Completed)   Low back pain with radiation       Relevant Medications   methocarbamol (ROBAXIN-750) 750 MG tablet   Other Relevant Orders   DG Lumbar Spine Complete (Completed)      Follow-up: Return in about  3 months (around 06/17/2020), or if symptoms worsen or fail to improve, for hypertension, hyperlipidemia.  Ann Held, DO

## 2020-03-19 NOTE — Telephone Encounter (Signed)
I saw Deborah Figueroa today--- she has had mult falls  And had run out of most of her meds I checked her labs today -- her app with the surgeon is on the 12 th Her sugar / a1c very elevated --- I m adding a small dose of lantus until she can get in to see you --- just wanted you to be aware We have not gotten in touch with her yet but they are calling now  If you would like me to do something different just let me know

## 2020-03-19 NOTE — Telephone Encounter (Signed)
Spoke w/ Kristopher Oppenheim- informed quantity is supposed to be 180 tablets.

## 2020-03-20 NOTE — Telephone Encounter (Signed)
Called pt and still no answer- JMA

## 2020-03-20 NOTE — Telephone Encounter (Signed)
Dr Kelton Pillar is in in agreement-- we just need to get a hold of pt We may have a sample of lantus in fridge and then she can come in for nurse visit , preferable today , to learn how to use

## 2020-03-20 NOTE — Telephone Encounter (Signed)
Appt 03-19-20

## 2020-03-20 NOTE — Telephone Encounter (Signed)
Called pt and spoke with pt son and advised and he will try to get in contact with patient. -JMA

## 2020-03-20 NOTE — Telephone Encounter (Signed)
That sounds good. Thanks a lot. I will make adjustments if needed when I see her . Thanks again

## 2020-03-21 ENCOUNTER — Inpatient Hospital Stay (HOSPITAL_COMMUNITY): Payer: Medicaid Other

## 2020-03-21 ENCOUNTER — Inpatient Hospital Stay (HOSPITAL_COMMUNITY)
Admission: EM | Admit: 2020-03-21 | Discharge: 2020-05-12 | DRG: 853 | Disposition: E | Payer: Medicaid Other | Attending: Pulmonary Disease | Admitting: Pulmonary Disease

## 2020-03-21 ENCOUNTER — Encounter (HOSPITAL_COMMUNITY): Payer: Self-pay | Admitting: *Deleted

## 2020-03-21 DIAGNOSIS — N179 Acute kidney failure, unspecified: Secondary | ICD-10-CM | POA: Diagnosis present

## 2020-03-21 DIAGNOSIS — D62 Acute posthemorrhagic anemia: Secondary | ICD-10-CM | POA: Diagnosis not present

## 2020-03-21 DIAGNOSIS — E210 Primary hyperparathyroidism: Secondary | ICD-10-CM | POA: Diagnosis present

## 2020-03-21 DIAGNOSIS — M869 Osteomyelitis, unspecified: Secondary | ICD-10-CM | POA: Diagnosis present

## 2020-03-21 DIAGNOSIS — G9341 Metabolic encephalopathy: Secondary | ICD-10-CM | POA: Diagnosis not present

## 2020-03-21 DIAGNOSIS — K633 Ulcer of intestine: Secondary | ICD-10-CM | POA: Diagnosis present

## 2020-03-21 DIAGNOSIS — A4101 Sepsis due to Methicillin susceptible Staphylococcus aureus: Principal | ICD-10-CM | POA: Diagnosis present

## 2020-03-21 DIAGNOSIS — N184 Chronic kidney disease, stage 4 (severe): Secondary | ICD-10-CM | POA: Diagnosis not present

## 2020-03-21 DIAGNOSIS — D124 Benign neoplasm of descending colon: Secondary | ICD-10-CM | POA: Diagnosis present

## 2020-03-21 DIAGNOSIS — M4624 Osteomyelitis of vertebra, thoracic region: Secondary | ICD-10-CM | POA: Diagnosis not present

## 2020-03-21 DIAGNOSIS — K626 Ulcer of anus and rectum: Secondary | ICD-10-CM | POA: Diagnosis present

## 2020-03-21 DIAGNOSIS — I700 Atherosclerosis of aorta: Secondary | ICD-10-CM | POA: Diagnosis present

## 2020-03-21 DIAGNOSIS — W010XXA Fall on same level from slipping, tripping and stumbling without subsequent striking against object, initial encounter: Secondary | ICD-10-CM | POA: Diagnosis present

## 2020-03-21 DIAGNOSIS — E876 Hypokalemia: Secondary | ICD-10-CM | POA: Diagnosis not present

## 2020-03-21 DIAGNOSIS — R000 Tachycardia, unspecified: Secondary | ICD-10-CM | POA: Diagnosis present

## 2020-03-21 DIAGNOSIS — Y95 Nosocomial condition: Secondary | ICD-10-CM | POA: Diagnosis not present

## 2020-03-21 DIAGNOSIS — J90 Pleural effusion, not elsewhere classified: Secondary | ICD-10-CM | POA: Diagnosis present

## 2020-03-21 DIAGNOSIS — E0781 Sick-euthyroid syndrome: Secondary | ICD-10-CM | POA: Diagnosis present

## 2020-03-21 DIAGNOSIS — R040 Epistaxis: Secondary | ICD-10-CM | POA: Diagnosis not present

## 2020-03-21 DIAGNOSIS — K8020 Calculus of gallbladder without cholecystitis without obstruction: Secondary | ICD-10-CM | POA: Diagnosis present

## 2020-03-21 DIAGNOSIS — F22 Delusional disorders: Secondary | ICD-10-CM | POA: Diagnosis not present

## 2020-03-21 DIAGNOSIS — R600 Localized edema: Secondary | ICD-10-CM | POA: Diagnosis not present

## 2020-03-21 DIAGNOSIS — M25511 Pain in right shoulder: Secondary | ICD-10-CM | POA: Diagnosis present

## 2020-03-21 DIAGNOSIS — R918 Other nonspecific abnormal finding of lung field: Secondary | ICD-10-CM

## 2020-03-21 DIAGNOSIS — Z5900 Homelessness unspecified: Secondary | ICD-10-CM

## 2020-03-21 DIAGNOSIS — M4620 Osteomyelitis of vertebra, site unspecified: Secondary | ICD-10-CM

## 2020-03-21 DIAGNOSIS — R6521 Severe sepsis with septic shock: Secondary | ICD-10-CM | POA: Diagnosis not present

## 2020-03-21 DIAGNOSIS — J851 Abscess of lung with pneumonia: Secondary | ICD-10-CM | POA: Diagnosis not present

## 2020-03-21 DIAGNOSIS — K253 Acute gastric ulcer without hemorrhage or perforation: Secondary | ICD-10-CM

## 2020-03-21 DIAGNOSIS — K319 Disease of stomach and duodenum, unspecified: Secondary | ICD-10-CM | POA: Diagnosis present

## 2020-03-21 DIAGNOSIS — R7989 Other specified abnormal findings of blood chemistry: Secondary | ICD-10-CM | POA: Diagnosis not present

## 2020-03-21 DIAGNOSIS — Z66 Do not resuscitate: Secondary | ICD-10-CM | POA: Diagnosis not present

## 2020-03-21 DIAGNOSIS — R112 Nausea with vomiting, unspecified: Secondary | ICD-10-CM | POA: Diagnosis not present

## 2020-03-21 DIAGNOSIS — K8689 Other specified diseases of pancreas: Secondary | ICD-10-CM

## 2020-03-21 DIAGNOSIS — E1169 Type 2 diabetes mellitus with other specified complication: Secondary | ICD-10-CM | POA: Diagnosis present

## 2020-03-21 DIAGNOSIS — E8770 Fluid overload, unspecified: Secondary | ICD-10-CM | POA: Diagnosis present

## 2020-03-21 DIAGNOSIS — K264 Chronic or unspecified duodenal ulcer with hemorrhage: Secondary | ICD-10-CM | POA: Diagnosis not present

## 2020-03-21 DIAGNOSIS — D72829 Elevated white blood cell count, unspecified: Secondary | ICD-10-CM

## 2020-03-21 DIAGNOSIS — A419 Sepsis, unspecified organism: Secondary | ICD-10-CM

## 2020-03-21 DIAGNOSIS — Z8249 Family history of ischemic heart disease and other diseases of the circulatory system: Secondary | ICD-10-CM

## 2020-03-21 DIAGNOSIS — E8809 Other disorders of plasma-protein metabolism, not elsewhere classified: Secondary | ICD-10-CM | POA: Diagnosis present

## 2020-03-21 DIAGNOSIS — M4640 Discitis, unspecified, site unspecified: Secondary | ICD-10-CM

## 2020-03-21 DIAGNOSIS — R195 Other fecal abnormalities: Secondary | ICD-10-CM | POA: Diagnosis not present

## 2020-03-21 DIAGNOSIS — E039 Hypothyroidism, unspecified: Secondary | ICD-10-CM | POA: Diagnosis present

## 2020-03-21 DIAGNOSIS — D689 Coagulation defect, unspecified: Secondary | ICD-10-CM

## 2020-03-21 DIAGNOSIS — M8448XA Pathological fracture, other site, initial encounter for fracture: Secondary | ICD-10-CM | POA: Diagnosis not present

## 2020-03-21 DIAGNOSIS — R54 Age-related physical debility: Secondary | ICD-10-CM | POA: Diagnosis present

## 2020-03-21 DIAGNOSIS — Z638 Other specified problems related to primary support group: Secondary | ICD-10-CM

## 2020-03-21 DIAGNOSIS — E1151 Type 2 diabetes mellitus with diabetic peripheral angiopathy without gangrene: Secondary | ICD-10-CM | POA: Diagnosis present

## 2020-03-21 DIAGNOSIS — M868X3 Other osteomyelitis, forearm: Secondary | ICD-10-CM | POA: Diagnosis present

## 2020-03-21 DIAGNOSIS — K269 Duodenal ulcer, unspecified as acute or chronic, without hemorrhage or perforation: Secondary | ICD-10-CM

## 2020-03-21 DIAGNOSIS — Z794 Long term (current) use of insulin: Secondary | ICD-10-CM

## 2020-03-21 DIAGNOSIS — N1832 Chronic kidney disease, stage 3b: Secondary | ICD-10-CM | POA: Diagnosis present

## 2020-03-21 DIAGNOSIS — J9601 Acute respiratory failure with hypoxia: Secondary | ICD-10-CM | POA: Diagnosis not present

## 2020-03-21 DIAGNOSIS — M25522 Pain in left elbow: Secondary | ICD-10-CM

## 2020-03-21 DIAGNOSIS — E785 Hyperlipidemia, unspecified: Secondary | ICD-10-CM | POA: Diagnosis present

## 2020-03-21 DIAGNOSIS — M25422 Effusion, left elbow: Secondary | ICD-10-CM | POA: Diagnosis present

## 2020-03-21 DIAGNOSIS — E870 Hyperosmolality and hypernatremia: Secondary | ICD-10-CM | POA: Diagnosis present

## 2020-03-21 DIAGNOSIS — Z978 Presence of other specified devices: Secondary | ICD-10-CM

## 2020-03-21 DIAGNOSIS — M60009 Infective myositis, unspecified site: Secondary | ICD-10-CM | POA: Diagnosis present

## 2020-03-21 DIAGNOSIS — R531 Weakness: Secondary | ICD-10-CM

## 2020-03-21 DIAGNOSIS — K068 Other specified disorders of gingiva and edentulous alveolar ridge: Secondary | ICD-10-CM | POA: Diagnosis not present

## 2020-03-21 DIAGNOSIS — I82611 Acute embolism and thrombosis of superficial veins of right upper extremity: Secondary | ICD-10-CM | POA: Diagnosis not present

## 2020-03-21 DIAGNOSIS — I129 Hypertensive chronic kidney disease with stage 1 through stage 4 chronic kidney disease, or unspecified chronic kidney disease: Secondary | ICD-10-CM | POA: Diagnosis present

## 2020-03-21 DIAGNOSIS — B9561 Methicillin susceptible Staphylococcus aureus infection as the cause of diseases classified elsewhere: Secondary | ICD-10-CM

## 2020-03-21 DIAGNOSIS — M25531 Pain in right wrist: Secondary | ICD-10-CM | POA: Diagnosis present

## 2020-03-21 DIAGNOSIS — Z4659 Encounter for fitting and adjustment of other gastrointestinal appliance and device: Secondary | ICD-10-CM

## 2020-03-21 DIAGNOSIS — K921 Melena: Secondary | ICD-10-CM

## 2020-03-21 DIAGNOSIS — D631 Anemia in chronic kidney disease: Secondary | ICD-10-CM | POA: Diagnosis present

## 2020-03-21 DIAGNOSIS — E11649 Type 2 diabetes mellitus with hypoglycemia without coma: Secondary | ICD-10-CM | POA: Diagnosis not present

## 2020-03-21 DIAGNOSIS — Z515 Encounter for palliative care: Secondary | ICD-10-CM

## 2020-03-21 DIAGNOSIS — Z83438 Family history of other disorder of lipoprotein metabolism and other lipidemia: Secondary | ICD-10-CM

## 2020-03-21 DIAGNOSIS — M60022 Infective myositis, left upper arm: Secondary | ICD-10-CM | POA: Diagnosis present

## 2020-03-21 DIAGNOSIS — K254 Chronic or unspecified gastric ulcer with hemorrhage: Secondary | ICD-10-CM | POA: Diagnosis not present

## 2020-03-21 DIAGNOSIS — J189 Pneumonia, unspecified organism: Secondary | ICD-10-CM

## 2020-03-21 DIAGNOSIS — E1122 Type 2 diabetes mellitus with diabetic chronic kidney disease: Secondary | ICD-10-CM | POA: Diagnosis present

## 2020-03-21 DIAGNOSIS — E041 Nontoxic single thyroid nodule: Secondary | ICD-10-CM

## 2020-03-21 DIAGNOSIS — M609 Myositis, unspecified: Secondary | ICD-10-CM | POA: Diagnosis not present

## 2020-03-21 DIAGNOSIS — F321 Major depressive disorder, single episode, moderate: Secondary | ICD-10-CM | POA: Diagnosis present

## 2020-03-21 DIAGNOSIS — R296 Repeated falls: Secondary | ICD-10-CM | POA: Diagnosis present

## 2020-03-21 DIAGNOSIS — B3789 Other sites of candidiasis: Secondary | ICD-10-CM | POA: Diagnosis not present

## 2020-03-21 DIAGNOSIS — L03114 Cellulitis of left upper limb: Secondary | ICD-10-CM | POA: Diagnosis present

## 2020-03-21 DIAGNOSIS — M25539 Pain in unspecified wrist: Secondary | ICD-10-CM

## 2020-03-21 DIAGNOSIS — K859 Acute pancreatitis without necrosis or infection, unspecified: Secondary | ICD-10-CM | POA: Diagnosis not present

## 2020-03-21 DIAGNOSIS — R579 Shock, unspecified: Secondary | ICD-10-CM

## 2020-03-21 DIAGNOSIS — Z9181 History of falling: Secondary | ICD-10-CM

## 2020-03-21 DIAGNOSIS — M71122 Other infective bursitis, left elbow: Secondary | ICD-10-CM | POA: Diagnosis present

## 2020-03-21 DIAGNOSIS — K863 Pseudocyst of pancreas: Secondary | ICD-10-CM | POA: Diagnosis present

## 2020-03-21 DIAGNOSIS — Z833 Family history of diabetes mellitus: Secondary | ICD-10-CM

## 2020-03-21 DIAGNOSIS — B370 Candidal stomatitis: Secondary | ICD-10-CM | POA: Diagnosis not present

## 2020-03-21 DIAGNOSIS — K5641 Fecal impaction: Secondary | ICD-10-CM | POA: Diagnosis present

## 2020-03-21 DIAGNOSIS — R7881 Bacteremia: Secondary | ICD-10-CM

## 2020-03-21 DIAGNOSIS — Z599 Problem related to housing and economic circumstances, unspecified: Secondary | ICD-10-CM

## 2020-03-21 DIAGNOSIS — N823 Fistula of vagina to large intestine: Secondary | ICD-10-CM | POA: Diagnosis not present

## 2020-03-21 DIAGNOSIS — Z7722 Contact with and (suspected) exposure to environmental tobacco smoke (acute) (chronic): Secondary | ICD-10-CM | POA: Diagnosis present

## 2020-03-21 DIAGNOSIS — Z823 Family history of stroke: Secondary | ICD-10-CM

## 2020-03-21 DIAGNOSIS — J9811 Atelectasis: Secondary | ICD-10-CM | POA: Diagnosis not present

## 2020-03-21 DIAGNOSIS — J9600 Acute respiratory failure, unspecified whether with hypoxia or hypercapnia: Secondary | ICD-10-CM

## 2020-03-21 DIAGNOSIS — Z20822 Contact with and (suspected) exposure to covid-19: Secondary | ICD-10-CM | POA: Diagnosis present

## 2020-03-21 DIAGNOSIS — D122 Benign neoplasm of ascending colon: Secondary | ICD-10-CM

## 2020-03-21 DIAGNOSIS — R110 Nausea: Secondary | ICD-10-CM

## 2020-03-21 DIAGNOSIS — E1110 Type 2 diabetes mellitus with ketoacidosis without coma: Secondary | ICD-10-CM | POA: Diagnosis present

## 2020-03-21 DIAGNOSIS — R578 Other shock: Secondary | ICD-10-CM | POA: Diagnosis not present

## 2020-03-21 DIAGNOSIS — M4644 Discitis, unspecified, thoracic region: Secondary | ICD-10-CM | POA: Diagnosis not present

## 2020-03-21 DIAGNOSIS — R188 Other ascites: Secondary | ICD-10-CM | POA: Diagnosis not present

## 2020-03-21 DIAGNOSIS — J852 Abscess of lung without pneumonia: Secondary | ICD-10-CM

## 2020-03-21 DIAGNOSIS — Z818 Family history of other mental and behavioral disorders: Secondary | ICD-10-CM

## 2020-03-21 DIAGNOSIS — M86032 Acute hematogenous osteomyelitis, left radius and ulna: Secondary | ICD-10-CM

## 2020-03-21 DIAGNOSIS — K651 Peritoneal abscess: Secondary | ICD-10-CM

## 2020-03-21 DIAGNOSIS — I493 Ventricular premature depolarization: Secondary | ICD-10-CM | POA: Diagnosis not present

## 2020-03-21 DIAGNOSIS — Z981 Arthrodesis status: Secondary | ICD-10-CM

## 2020-03-21 DIAGNOSIS — D500 Iron deficiency anemia secondary to blood loss (chronic): Secondary | ICD-10-CM

## 2020-03-21 DIAGNOSIS — Z79899 Other long term (current) drug therapy: Secondary | ICD-10-CM

## 2020-03-21 DIAGNOSIS — E860 Dehydration: Secondary | ICD-10-CM | POA: Diagnosis present

## 2020-03-21 DIAGNOSIS — K2211 Ulcer of esophagus with bleeding: Secondary | ICD-10-CM | POA: Diagnosis not present

## 2020-03-21 DIAGNOSIS — S060X9A Concussion with loss of consciousness of unspecified duration, initial encounter: Secondary | ICD-10-CM | POA: Diagnosis present

## 2020-03-21 HISTORY — DX: Disorder of kidney and ureter, unspecified: N28.9

## 2020-03-21 HISTORY — DX: Type 2 diabetes mellitus without complications: E11.9

## 2020-03-21 LAB — BLOOD GAS, VENOUS
Acid-base deficit: 11.6 mmol/L — ABNORMAL HIGH (ref 0.0–2.0)
Bicarbonate: 14.6 mmol/L — ABNORMAL LOW (ref 20.0–28.0)
FIO2: 21
O2 Saturation: 86.3 %
Patient temperature: 98.6
pCO2, Ven: 35.7 mmHg — ABNORMAL LOW (ref 44.0–60.0)
pH, Ven: 7.233 — ABNORMAL LOW (ref 7.250–7.430)
pO2, Ven: 64.8 mmHg — ABNORMAL HIGH (ref 32.0–45.0)

## 2020-03-21 LAB — CBG MONITORING, ED
Glucose-Capillary: 537 mg/dL (ref 70–99)
Glucose-Capillary: 518 mg/dL (ref 70–99)
Glucose-Capillary: 472 mg/dL — ABNORMAL HIGH (ref 70–99)
Glucose-Capillary: 313 mg/dL — ABNORMAL HIGH (ref 70–99)
Glucose-Capillary: 232 mg/dL — ABNORMAL HIGH (ref 70–99)
Glucose-Capillary: 170 mg/dL — ABNORMAL HIGH (ref 70–99)
Glucose-Capillary: 232 mg/dL — ABNORMAL HIGH (ref 70–99)

## 2020-03-21 LAB — BASIC METABOLIC PANEL
Anion gap: 19 — ABNORMAL HIGH (ref 5–15)
BUN: 73 mg/dL — ABNORMAL HIGH (ref 8–23)
CO2: 13 mmol/L — ABNORMAL LOW (ref 22–32)
Calcium: 13.3 mg/dL (ref 8.9–10.3)
Chloride: 100 mmol/L (ref 98–111)
Creatinine, Ser: 2.23 mg/dL — ABNORMAL HIGH (ref 0.44–1.00)
GFR, Estimated: 24 mL/min — ABNORMAL LOW (ref >60)
Glucose, Bld: 650 mg/dL (ref 70–99)
Potassium: 3.8 mmol/L (ref 3.5–5.1)
Sodium: 132 mmol/L — ABNORMAL LOW (ref 135–145)

## 2020-03-21 LAB — CBC
HCT: 24.7 % — ABNORMAL LOW (ref 36.0–46.0)
Hemoglobin: 7.7 g/dL — ABNORMAL LOW (ref 12.0–15.0)
MCH: 28.6 pg (ref 26.0–34.0)
MCHC: 31.2 g/dL (ref 30.0–36.0)
MCV: 91.8 fL (ref 80.0–100.0)
Platelets: 469 10*3/uL — ABNORMAL HIGH (ref 150–400)
RBC: 2.69 MIL/uL — ABNORMAL LOW (ref 3.87–5.11)
RDW: 15.6 % — ABNORMAL HIGH (ref 11.5–15.5)
WBC: 18 10*3/uL — ABNORMAL HIGH (ref 4.0–10.5)
nRBC: 0 % (ref 0.0–0.2)

## 2020-03-21 LAB — BETA-HYDROXYBUTYRIC ACID: Beta-Hydroxybutyric Acid: 6.21 mmol/L — ABNORMAL HIGH (ref 0.05–0.27)

## 2020-03-21 MED ORDER — FENOFIBRATE 160 MG PO TABS
160.0000 mg | ORAL_TABLET | Freq: Every day | ORAL | Status: DC
  Administered 2020-03-22 – 2020-04-12 (×16): 160 mg via ORAL
  Filled 2020-03-21 (×24): qty 1

## 2020-03-21 MED ORDER — DEXTROSE 50 % IV SOLN: 0.0000 mL | INTRAVENOUS | Status: DC | PRN

## 2020-03-21 MED ORDER — ATORVASTATIN CALCIUM 40 MG PO TABS
40.0000 mg | ORAL_TABLET | Freq: Every day | ORAL | Status: DC
Start: 2020-03-22 — End: 2020-04-13
  Administered 2020-03-24 – 2020-04-12 (×15): 40 mg via ORAL
  Filled 2020-03-21 (×16): qty 1

## 2020-03-21 MED ORDER — ONDANSETRON HCL 4 MG/2ML IJ SOLN
4.0000 mg | Freq: Four times a day (QID) | INTRAMUSCULAR | Status: DC | PRN
  Administered 2020-03-24 – 2020-04-22 (×31): 4 mg via INTRAVENOUS
  Filled 2020-03-21 (×32): qty 2

## 2020-03-21 MED ORDER — POTASSIUM CHLORIDE 10 MEQ/100ML IV SOLN
10.0000 meq | INTRAVENOUS | Status: AC
  Administered 2020-03-21 (×2): 10 meq via INTRAVENOUS
  Filled 2020-03-21 (×2): qty 100

## 2020-03-21 MED ORDER — INSULIN REGULAR(HUMAN) IN NACL 100-0.9 UT/100ML-% IV SOLN
INTRAVENOUS | Status: DC
Start: 2020-03-21 — End: 2020-03-22
  Administered 2020-03-21: 2.6 [IU]/h via INTRAVENOUS
  Administered 2020-03-21: 11.5 [IU]/h via INTRAVENOUS
  Administered 2020-03-21: 5 [IU]/h via INTRAVENOUS
  Administered 2020-03-21: 6.5 [IU]/h via INTRAVENOUS
  Administered 2020-03-22: 9.5 [IU]/h via INTRAVENOUS
  Filled 2020-03-21 (×3): qty 100

## 2020-03-21 MED ORDER — DEXTROSE IN LACTATED RINGERS 5 % IV SOLN: INTRAVENOUS | Status: DC

## 2020-03-21 MED ORDER — LACTATED RINGERS IV SOLN: INTRAVENOUS | Status: DC

## 2020-03-21 MED ORDER — METOPROLOL SUCCINATE ER 50 MG PO TB24
100.0000 mg | ORAL_TABLET | Freq: Every day | ORAL | Status: DC
  Administered 2020-03-21: 100 mg via ORAL
  Filled 2020-03-21: qty 2

## 2020-03-21 MED ORDER — LACTATED RINGERS IV BOLUS
20.0000 mL/kg | Freq: Once | INTRAVENOUS | Status: AC
  Administered 2020-03-21: 1878 mL via INTRAVENOUS

## 2020-03-21 MED ORDER — ONDANSETRON HCL 4 MG PO TABS
4.0000 mg | ORAL_TABLET | Freq: Four times a day (QID) | ORAL | Status: DC | PRN
Start: 2020-03-21 — End: 2020-04-23
  Administered 2020-03-31 – 2020-04-16 (×2): 4 mg via ORAL
  Filled 2020-03-21: qty 1

## 2020-03-21 NOTE — ED Provider Notes (Signed)
[facility] DEPT Provider Note   CSN: [id number] Arrival date & time: 04/10/2020  1158     History Chief Complaint  Patient presents with   Hyperglycemia    Deborah Figueroa is a 65 y.o. female with PMH of HTN, HLD, and IDDM who presents the ED via EMS for weakness and hyperglycemia.  I personally obtained history from EMS reports that patient had initial CBG that read as "red high" and she has been having multiple falls over the course of the past few weeks.  She is currently living alone at a hotel, but has been trying to facilitate new living arrangement.  There has been discussion with her primary care provider about possible assisted living given her weakness and falls.  Patient reports 5 falls over the course of the past several weeks.  She also states that she had recently been transition from metformin to glipizide medication, with poor success.  She is endorsing generalized fatigue and weakness, mild constipation, polydipsia, and polyuria.  She also complains of continued discomfort involving her right scapular region and left elbow.  Mild headache symptoms due to her recent fall.  She denies any chest pain, shortness of breath, cough, fevers or chills, dysuria, pelvic pain, or other symptoms.  She was noted to be tachycardic by EMS and given elevated blood sugars, 1 L IV NS already started while in route to the hospital.   I reviewed patient's medical record and she was evaluated in the ED on 03/01/2020 after she sustained a mechanical fall and diagnosed with concussion and muscle strain.  She was discharged home with Robaxin.  She then fell again 03/17/2020 after tripping on the sidewalk, endorsed ataxia as contributing factor to her primary care provider Dr. Cheri Rous with [facility].  Imaging of her head, lumbar spine, right shoulder, and left elbow were obtained and without any significant findings aside from olecranon bursitis.  Radiologist could not exclude  underlying osteomyelitis over olecranon.    Patient's most recent A1c obtained 2 days ago during her encounter with primary care provider 03/19/2020 which showed significant progression from 6.6 >> 10.4.  Her CMP was notable for significant hyperglycemia without metabolic acidosis.  Hypercalcemia due to her parathyroid disease, surgery scheduled for 03/25/2020.  Son was contacted due to hyperglycemia and asked that he bring her in to educate her on how to use lantus given her worsening glucose control.    HPI     Past Medical History:  Diagnosis Date   Cellulitis    Depression    Hyperlipidemia    Hypertension    Seasonal allergies     Patient Active Problem List   Diagnosis Date Noted   DKA (diabetic ketoacidosis) ([place]) 03/25/2020   DM (diabetes mellitus) type II uncontrolled, periph vascular disorder ([place]) 01/12/2018   Daytime somnolence 10/02/2017   Diabetes mellitus type II, uncontrolled ([place]) 08/12/2016   Toenail fungus 07/31/2012   HYPERCALCEMIA 04/10/2009   Hyperlipidemia 02/06/2008   Depression, major, single episode, moderate ([place]) 02/06/2008   Essential hypertension 02/06/2008    Past Surgical History:  Procedure Laterality Date   BACK SURGERY     BREAST SURGERY     lumpectomy left breast   LUMBAR FUSION       OB History   No obstetric history on file.     Family History  Problem Relation Age of Onset   Stroke Mother    Hypertension Mother    Hyperlipidemia Mother    Cancer Father 19  liver and colon   Depression Father    Cancer Maternal Aunt        breast   Diabetes Brother    Cancer Maternal Grandmother        breast    Social History   Tobacco Use   Smoking status: Never Smoker   Smokeless tobacco: Never Used  Vaping Use   Vaping Use: Never used  Substance Use Topics   Alcohol use: No   Drug use: No    Home Medications Prior to Admission medications   Medication Sig Start Date End Date Taking?  Authorizing Provider  atorvastatin (LIPITOR) 40 MG tablet Take 1 tablet (40 mg total) by mouth daily. 03/19/20  Yes Roma Schanz R, DO  fenofibrate 160 MG tablet Take 1 tablet (160 mg total) by mouth daily. 03/19/20  Yes Roma Schanz R, DO  glipiZIDE (GLUCOTROL) 5 MG tablet Take 0.5 tablets (2.5 mg total) by mouth daily before breakfast. 11/25/19  Yes Shamleffer, Melanie Crazier, MD  Insulin Pen Needle 31G X 8 MM MISC 1 daily 03/19/20  Yes Carollee Herter, Kendrick Fries R, DO  Lancets [facility] ULTRASOFT) lancets Use as instructed 10/02/17  Yes Roma Schanz R, DO  lisinopril-hydrochlorothiazide (ZESTORETIC) 20-12.5 MG tablet Take 2 tablets by mouth daily. 03/19/20  Yes Ann Held, DO  methocarbamol (ROBAXIN-750) 750 MG tablet Take 1 tablet (750 mg total) by mouth 4 (four) times daily. 03/19/20  Yes Roma Schanz R, DO  metoprolol succinate (TOPROL-XL) 100 MG 24 hr tablet Take with or immediately following a meal. 03/19/20  Yes Roma Schanz R, DO  sertraline (ZOLOFT) 100 MG tablet Take 1 tablet (100 mg total) by mouth daily. 03/19/20  Yes Ann Held, DO  cyclobenzaprine (FLEXERIL) 5 MG tablet Take 0.5-1 tablets (2.5-5 mg total) by mouth 3 (three) times daily as needed for muscle spasms. DO NOT DRINK ALCOHOL OR DRIVE WHILE TAKING THIS MEDICATION 02/29/20   Volney American, PA-C  glucose blood [facility] VERIO) test strip USE AS DIRECTED TO CHECK BLOOD SUGAR ONCE DAILY Patient not taking: Reported on 03/14/2020 07/31/19   Carollee Herter, Alferd Apa, DO  insulin glargine (LANTUS SOLOSTAR) 100 UNIT/ML Solostar Pen Inject 10 Units into the skin daily. Patient not taking: Reported on 04/12/2020 03/19/20   Ann Held, DO    Allergies    Patient has no known allergies.  Review of Systems   Review of Systems  All other systems reviewed and are negative.   Physical Exam Updated Vital Signs BP (!) 162/112    Pulse (!) 107    Temp 97.7 F (36.5 C) (Oral)    Resp 16     SpO2 100%   Physical Exam Vitals and nursing note reviewed. Exam conducted with a chaperone present.  Constitutional:      General: She is not in acute distress.    Appearance: She is ill-appearing.  HENT:     Head: Normocephalic and atraumatic.  Eyes:     General: No scleral icterus.    Conjunctiva/sclera: Conjunctivae normal.  Cardiovascular:     Rate and Rhythm: Regular rhythm. Tachycardia present.     Pulses: Normal pulses.     Heart sounds: Normal heart sounds.  Pulmonary:     Effort: No respiratory distress.     Breath sounds: Normal breath sounds.     Comments: Mild tachypnea and increased work of breathing. Skin:    General: Skin is dry.  Neurological:  Mental Status: She is alert.     GCS: GCS eye subscore is 4. GCS verbal subscore is 5. GCS motor subscore is 6.  Psychiatric:        Mood and Affect: Mood normal.        Behavior: Behavior normal.        Thought Content: Thought content normal.     ED Results / Procedures / Treatments   Labs (all labs ordered are listed, but only abnormal results are displayed) Labs Reviewed  BASIC METABOLIC PANEL - Abnormal; Notable for the following components:      Result Value   Sodium 132 (*)    CO2 13 (*)    Glucose, Bld 650 (*)    BUN 73 (*)    Creatinine, Ser 2.23 (*)    Calcium 13.3 (*)    GFR, Estimated 24 (*)    Anion gap 19 (*)    All other components within normal limits  CBC - Abnormal; Notable for the following components:   WBC 18.0 (*)    RBC 2.69 (*)    Hemoglobin 7.7 (*)    HCT 24.7 (*)    RDW 15.6 (*)    Platelets 469 (*)    All other components within normal limits  BLOOD GAS, VENOUS - Abnormal; Notable for the following components:   pH, Ven 7.233 (*)    pCO2, Ven 35.7 (*)    pO2, Ven 64.8 (*)    Bicarbonate 14.6 (*)    Acid-base deficit 11.6 (*)    All other components within normal limits  CBG MONITORING, ED - Abnormal; Notable for the following components:   Glucose-Capillary 537 (*)     All other components within normal limits  SARS CORONAVIRUS 2 (TAT 6-24 HRS)  URINALYSIS, ROUTINE W REFLEX MICROSCOPIC  BETA-HYDROXYBUTYRIC ACID  BETA-HYDROXYBUTYRIC ACID    EKG None  Radiology No results found.  Procedures .Critical Care Performed by: Corena Herter, PA-C Authorized by: Corena Herter, PA-C   Critical care provider statement:    Critical care time (minutes):  60   Critical care was necessary to treat or prevent imminent or life-threatening deterioration of the following conditions:  Metabolic crisis   Critical care was time spent personally by me on the following activities:  Discussions with consultants, evaluation of patient's response to treatment, examination of patient, ordering and performing treatments and interventions, ordering and review of laboratory studies, ordering and review of radiographic studies, pulse oximetry, re-evaluation of patient's condition, obtaining history from patient or surrogate and review of old charts Comments:     DKA   (including critical care time)  Medications Ordered in ED Medications  insulin regular, human (MYXREDLIN) 100 units/ 100 mL infusion (has no administration in time range)  lactated ringers infusion ( Intravenous New Bag/Given 04/09/2020 1415)  dextrose 5 % in lactated ringers infusion (0 mLs Intravenous Hold 03/27/2020 1416)  dextrose 50 % solution 0-50 mL (has no administration in time range)  potassium chloride 10 mEq in 100 mL IVPB (10 mEq Intravenous New Bag/Given 03/23/2020 1424)  lactated ringers bolus 1,878 mL (1,878 mLs Intravenous New Bag/Given 03/30/2020 1414)    ED Course  I have reviewed the triage vital signs and the nursing notes.  Pertinent labs & imaging results that were available during my care of the patient were reviewed by me and considered in my medical decision making (see chart for details).  Clinical Course as of 03/20/2020 1506  Sat Mar 21, 2020  [place] with Dr. Marylyn Ishihara who will see and  admit patient for ongoing management of her DKA. [GG]    Clinical Course User Index [GG] Corena Herter, PA-C   MDM Rules/Calculators/A&P                          Patient's history and physical exam is concerning for hyperglycemic crisis.  She states that she has had 5 falls over the course of the past few weeks due to generalized weakness in addition to her ongoing instability involving left knee.  She complains of continued discomfort involving her right scapular region and left elbow, however I have reviewed images obtained which were largely unremarkable.    Labs CBC: Leukocytosis to 18.0 and anemia with hemoglobin reduced 7.7.  Unsure as to the chronicity of her anemia given no recent labs with which to compare. BMP: Hyperglycemia to 650. Hypercalcemia to 13.3 (consistent with her recent labs).  Patient with high anion gap metabolic acidosis with bicarb of 13 and anion gap elevated to 19.  She also has mildly worsening renal function with creatinine elevated to 2.23 and reduced GFR to 24.   VBG, beta hydroxybutyric acid, UA, and COVID-19 tests are pending.  Patient has had the discussion with her son and primary care provider about possible placement for assisted living facility.  Case management will likely need to be involved prior to discharge from hospital.  Plan to admit to hospitalist for diabetic ketoacidosis.  Spoke with Dr. Marylyn Ishihara who will see and admit patient for ongoing management of her DKA.   Final Clinical Impression(s) / ED Diagnoses Final diagnoses:  Diabetic ketoacidosis without coma associated with type 2 diabetes mellitus [facility])    Rx / DC Orders ED Discharge Orders    None       Corena Herter, PA-C 03/22/2020 1506    Lacretia Leigh, MD 03/22/20 [phone]

## 2020-03-21 NOTE — H&P (Addendum)
History and Physical    Deborah Figueroa:[id number] DOB: Sep 13, 1955 DOA: 03/20/2020  PCP: Ann Held, DO  Patient coming from: Home  Chief Complaint: Weakness  HPI: Deborah Figueroa is a 65 y.o. female with medical history significant of DM2, HTN, HLD. Presenting with weakness. This weakness has led to multiple falls over the last 2 weeks where she has injured herself. She has been eval'd in the ED for these injuries in the last few days and has been deemed stable for discharge. She reports increasing fatigue and now polyuria and polydipsia. She returned to the ED for concern of her ongoing weakness and these new symptoms. She denies any other aggravating or alleviating factors.  ED Course: She was found to be in DKA. She was started on insulin gtt. TRH was called for admission.   Review of Systems:  Denies CP, palpitations, dyspnea, LOC, syncopal episodes, seizure. Reports shoulder pain and elbow pain. Review of systems is otherwise negative for all not mentioned in HPI.   PMHx Past Medical History:  Diagnosis Date  . Cellulitis   . Depression   . Hyperlipidemia   . Hypertension   . Seasonal allergies     PSHx Past Surgical History:  Procedure Laterality Date  . BACK SURGERY    . BREAST SURGERY     lumpectomy left breast  . LUMBAR FUSION      SocHx  reports that she has never smoked. She has never used smokeless tobacco. She reports that she does not drink alcohol and does not use drugs.  No Known Allergies  FamHx Family History  Problem Relation Age of Onset  . Stroke Mother   . Hypertension Mother   . Hyperlipidemia Mother   . Cancer Father 75       liver and colon  . Depression Father   . Cancer Maternal Aunt        breast  . Diabetes Brother   . Cancer Maternal Grandmother        breast    Prior to Admission medications   Medication Sig Start Date End Date Taking? Authorizing Provider  atorvastatin (LIPITOR) 40 MG tablet Take 1 tablet (40  mg total) by mouth daily. 03/19/20  Yes Roma Schanz R, DO  fenofibrate 160 MG tablet Take 1 tablet (160 mg total) by mouth daily. 03/19/20  Yes Roma Schanz R, DO  glipiZIDE (GLUCOTROL) 5 MG tablet Take 0.5 tablets (2.5 mg total) by mouth daily before breakfast. 11/25/19  Yes Shamleffer, Melanie Crazier, MD  Insulin Pen Needle 31G X 8 MM MISC 1 daily 03/19/20  Yes Carollee Herter, Kendrick Fries R, DO  Lancets [facility] ULTRASOFT) lancets Use as instructed 10/02/17  Yes Roma Schanz R, DO  lisinopril-hydrochlorothiazide (ZESTORETIC) 20-12.5 MG tablet Take 2 tablets by mouth daily. 03/19/20  Yes Ann Held, DO  methocarbamol (ROBAXIN-750) 750 MG tablet Take 1 tablet (750 mg total) by mouth 4 (four) times daily. 03/19/20  Yes Roma Schanz R, DO  metoprolol succinate (TOPROL-XL) 100 MG 24 hr tablet Take with or immediately following a meal. 03/19/20  Yes Roma Schanz R, DO  sertraline (ZOLOFT) 100 MG tablet Take 1 tablet (100 mg total) by mouth daily. 03/19/20  Yes Ann Held, DO  cyclobenzaprine (FLEXERIL) 5 MG tablet Take 0.5-1 tablets (2.5-5 mg total) by mouth 3 (three) times daily as needed for muscle spasms. DO NOT DRINK ALCOHOL OR DRIVE WHILE TAKING THIS MEDICATION 02/29/20  Volney American, PA-C  glucose blood [facility] VERIO) test strip USE AS DIRECTED TO CHECK BLOOD SUGAR ONCE DAILY Patient not taking: Reported on 03/25/2020 07/31/19   Carollee Herter, Alferd Apa, DO  insulin glargine (LANTUS SOLOSTAR) 100 UNIT/ML Solostar Pen Inject 10 Units into the skin daily. Patient not taking: Reported on 03/27/2020 03/19/20   Ann Held, DO    Physical Exam: Vitals:   03/16/2020 1345 03/26/2020 1400 04/12/2020 1415 04/07/2020 1430  BP:  (!) 162/112    Pulse: (!) 106 (!) 107 (!) 111 (!) 107  Resp: (!) 21 18 19 16   Temp:      TempSrc:      SpO2: 97% 100% 99% 100%    General: 65 y.o. female resting in bed in NAD Eyes: PERRL, normal sclera Head: periorbital bruising,  right forehead bruising ENMT: Nares patent w/o discharge, orophaynx clear, dentition normal, ears w/o discharge/lesions/ulcers Neck: Supple, trachea midline Cardiovascular: tachy, +S1, S2, no m/g/r, equal pulses throughout Respiratory: CTABL, no w/r/r, normal WOB GI: BS+, NDNT, no masses noted, no organomegaly noted MSK: No e/c/c, second digit hammertoe right toe Neuro: A&O x 3, no focal deficits Psyc: Appropriate interaction and affect, calm/cooperative  Labs on Admission: I have personally reviewed following labs and imaging studies  CBC: Recent Labs  Lab 04/05/2020 1247  WBC 18.0*  HGB 7.7*  HCT 24.7*  MCV 91.8  PLT 109*   Basic Metabolic Panel: Recent Labs  Lab 03/19/20 1047 03/17/2020 1247  NA 127* 132*  K 4.5 3.8  CL 92* 100  CO2 21 13*  GLUCOSE 589* 650*  BUN 73* 73*  CREATININE 2.17* 2.23*  CALCIUM 13.9* 13.3*   GFR: Estimated Creatinine Clearance: 32.2 mL/min (A) (by C-G formula based on SCr of 2.23 mg/dL (H)). Liver Function Tests: Recent Labs  Lab 03/19/20 1047  AST 10  ALT 18  ALKPHOS 125*  BILITOT 0.6  PROT 6.7  ALBUMIN 3.1*   No results for input(s): LIPASE, AMYLASE in the last 168 hours. No results for input(s): AMMONIA in the last 168 hours. Coagulation Profile: No results for input(s): INR, PROTIME in the last 168 hours. Cardiac Enzymes: No results for input(s): CKTOTAL, CKMB, CKMBINDEX, TROPONINI in the last 168 hours. BNP (last 3 results) No results for input(s): PROBNP in the last 8760 hours. HbA1C: Recent Labs    03/19/20 1111  HGBA1C 10.4*   CBG: Recent Labs  Lab 04/09/2020 1245  GLUCAP 537*   Lipid Profile: Recent Labs    03/19/20 1047  CHOL 150  HDL 16.70*  TRIG 204.0*  CHOLHDL 9  LDLDIRECT 46.0   Thyroid Function Tests: Recent Labs    03/19/20 1047  TSH 0.28*   Anemia Panel: No results for input(s): VITAMINB12, FOLATE, FERRITIN, TIBC, IRON, RETICCTPCT in the last 72 hours. Urine analysis:    Component Value  Date/Time   COLORURINE YELLOW 02/14/2019 2359   APPEARANCEUR CLEAR 02/14/2019 2359   LABSPEC 1.014 02/14/2019 2359   PHURINE 5.0 02/14/2019 2359   GLUCOSEU NEGATIVE 02/14/2019 2359   HGBUR SMALL (A) 02/14/2019 2359   BILIRUBINUR NEGATIVE 02/14/2019 2359   BILIRUBINUR Neg 11/21/2014 1158   KETONESUR NEGATIVE 02/14/2019 2359   PROTEINUR NEGATIVE 02/14/2019 2359   UROBILINOGEN 0.2 01/04/2015 1856   NITRITE NEGATIVE 02/14/2019 2359   LEUKOCYTESUR LARGE (A) 02/14/2019 2359    Radiological Exams on Admission: No results found.  Assessment/Plan DKA     - admit to inpt, SDU     - endotool for insuling  gtt, fluids     - last A1c was 10.4     - ?medical compliance issue?      - has elevated WBC, checking CXR, UA/UCx, Bld Cx  Falls     - PT/OT eval  Hyperthyroidism?     - check FT4/3  AKI     - secondary to above, fluids     - check renal US  Hypercalcemia     - check PTH/PTH-rp, VitD lvl and CXR     - fluids  Normocytic anemia     - no evidence of bleed     - check iron studies     - follow H&H  Leukocytosis     - no fever; checking CXR, UA/UCx, Bld Cx; no abx for right now  DVT prophylaxis: SCDs  Code Status: FULL  Family Communication: None at bedside.  Consults called: None   Status is: Inpatient  Remains inpatient appropriate because:Inpatient level of care appropriate due to severity of illness   Dispo: The patient is from: Home              Anticipated d/c is to: Home              Anticipated d/c date is: 3 days              Patient currently is not medically stable to d/c.  Jonnie Finner DO [facility]  If 7PM-7AM, please contact night-coverage [email or web address]  03/28/2020, 3:19 PM

## 2020-03-21 NOTE — ED Triage Notes (Signed)
Per EMS, pt from here for hyperglycemia and weakness. She fell getting from bathroom to bed today, called hotel front desk, who called EMS. CBG read high for EMS. Has had increased thirst, polyuria, recently changed diabetic meds. She lives at hotel, would like to talk to social work about housing.    BP 149/90 HR 108 Temp 97.7 O2 97%

## 2020-03-21 NOTE — ED Notes (Signed)
Placed pt on purewick  

## 2020-03-22 ENCOUNTER — Inpatient Hospital Stay (HOSPITAL_COMMUNITY): Payer: Medicaid Other

## 2020-03-22 ENCOUNTER — Encounter: Payer: Self-pay | Admitting: Family Medicine

## 2020-03-22 DIAGNOSIS — F321 Major depressive disorder, single episode, moderate: Secondary | ICD-10-CM

## 2020-03-22 DIAGNOSIS — D72829 Elevated white blood cell count, unspecified: Secondary | ICD-10-CM

## 2020-03-22 DIAGNOSIS — A4101 Sepsis due to Methicillin susceptible Staphylococcus aureus: Secondary | ICD-10-CM | POA: Clinically undetermined

## 2020-03-22 DIAGNOSIS — R6520 Severe sepsis without septic shock: Secondary | ICD-10-CM

## 2020-03-22 DIAGNOSIS — M25511 Pain in right shoulder: Secondary | ICD-10-CM | POA: Insufficient documentation

## 2020-03-22 DIAGNOSIS — E1110 Type 2 diabetes mellitus with ketoacidosis without coma: Secondary | ICD-10-CM

## 2020-03-22 DIAGNOSIS — N179 Acute kidney failure, unspecified: Secondary | ICD-10-CM

## 2020-03-22 DIAGNOSIS — R531 Weakness: Secondary | ICD-10-CM

## 2020-03-22 DIAGNOSIS — R296 Repeated falls: Secondary | ICD-10-CM

## 2020-03-22 DIAGNOSIS — M25522 Pain in left elbow: Secondary | ICD-10-CM

## 2020-03-22 DIAGNOSIS — S0990XA Unspecified injury of head, initial encounter: Secondary | ICD-10-CM | POA: Insufficient documentation

## 2020-03-22 DIAGNOSIS — B9561 Methicillin susceptible Staphylococcus aureus infection as the cause of diseases classified elsewhere: Secondary | ICD-10-CM

## 2020-03-22 DIAGNOSIS — R7881 Bacteremia: Secondary | ICD-10-CM

## 2020-03-22 DIAGNOSIS — M869 Osteomyelitis, unspecified: Secondary | ICD-10-CM | POA: Diagnosis present

## 2020-03-22 DIAGNOSIS — M7022 Olecranon bursitis, left elbow: Secondary | ICD-10-CM | POA: Insufficient documentation

## 2020-03-22 LAB — BASIC METABOLIC PANEL
Anion gap: 9 (ref 5–15)
Anion gap: 9 (ref 5–15)
Anion gap: 10 (ref 5–15)
Anion gap: 11 (ref 5–15)
BUN: 59 mg/dL — ABNORMAL HIGH (ref 8–23)
BUN: 53 mg/dL — ABNORMAL HIGH (ref 8–23)
BUN: 52 mg/dL — ABNORMAL HIGH (ref 8–23)
BUN: 51 mg/dL — ABNORMAL HIGH (ref 8–23)
CO2: 21 mmol/L — ABNORMAL LOW (ref 22–32)
CO2: 22 mmol/L (ref 22–32)
CO2: 21 mmol/L — ABNORMAL LOW (ref 22–32)
CO2: 19 mmol/L — ABNORMAL LOW (ref 22–32)
Calcium: >15 mg/dL (ref 8.9–10.3)
Calcium: >15 mg/dL (ref 8.9–10.3)
Calcium: 13.9 mg/dL (ref 8.9–10.3)
Calcium: 13.2 mg/dL (ref 8.9–10.3)
Chloride: 106 mmol/L (ref 98–111)
Chloride: 109 mmol/L (ref 98–111)
Chloride: 108 mmol/L (ref 98–111)
Chloride: 109 mmol/L (ref 98–111)
Creatinine, Ser: 1.65 mg/dL — ABNORMAL HIGH (ref 0.44–1.00)
Creatinine, Ser: 1.54 mg/dL — ABNORMAL HIGH (ref 0.44–1.00)
Creatinine, Ser: 1.67 mg/dL — ABNORMAL HIGH (ref 0.44–1.00)
Creatinine, Ser: 1.61 mg/dL — ABNORMAL HIGH (ref 0.44–1.00)
GFR, Estimated: 34 mL/min — ABNORMAL LOW (ref >60)
GFR, Estimated: 37 mL/min — ABNORMAL LOW (ref >60)
GFR, Estimated: 34 mL/min — ABNORMAL LOW (ref >60)
GFR, Estimated: 36 mL/min — ABNORMAL LOW (ref >60)
Glucose, Bld: 334 mg/dL — ABNORMAL HIGH (ref 70–99)
Glucose, Bld: 118 mg/dL — ABNORMAL HIGH (ref 70–99)
Glucose, Bld: 186 mg/dL — ABNORMAL HIGH (ref 70–99)
Glucose, Bld: 336 mg/dL — ABNORMAL HIGH (ref 70–99)
Potassium: 4 mmol/L (ref 3.5–5.1)
Potassium: 3.4 mmol/L — ABNORMAL LOW (ref 3.5–5.1)
Potassium: 4.9 mmol/L (ref 3.5–5.1)
Potassium: 4 mmol/L (ref 3.5–5.1)
Sodium: 136 mmol/L (ref 135–145)
Sodium: 140 mmol/L (ref 135–145)
Sodium: 139 mmol/L (ref 135–145)
Sodium: 139 mmol/L (ref 135–145)

## 2020-03-22 LAB — CBG MONITORING, ED
Glucose-Capillary: 229 mg/dL — ABNORMAL HIGH (ref 70–99)
Glucose-Capillary: 104 mg/dL — ABNORMAL HIGH (ref 70–99)
Glucose-Capillary: 238 mg/dL — ABNORMAL HIGH (ref 70–99)
Glucose-Capillary: 285 mg/dL — ABNORMAL HIGH (ref 70–99)
Glucose-Capillary: 260 mg/dL — ABNORMAL HIGH (ref 70–99)
Glucose-Capillary: 281 mg/dL — ABNORMAL HIGH (ref 70–99)
Glucose-Capillary: 264 mg/dL — ABNORMAL HIGH (ref 70–99)
Glucose-Capillary: 209 mg/dL — ABNORMAL HIGH (ref 70–99)
Glucose-Capillary: 101 mg/dL — ABNORMAL HIGH (ref 70–99)
Glucose-Capillary: 169 mg/dL — ABNORMAL HIGH (ref 70–99)
Glucose-Capillary: 230 mg/dL — ABNORMAL HIGH (ref 70–99)
Glucose-Capillary: 258 mg/dL — ABNORMAL HIGH (ref 70–99)

## 2020-03-22 LAB — BLOOD CULTURE ID PANEL (REFLEXED) - BCID2

## 2020-03-22 LAB — URINALYSIS, ROUTINE W REFLEX MICROSCOPIC
Bilirubin Urine: NEGATIVE
Glucose, UA: >=500 mg/dL — AB
Ketones, ur: 20 mg/dL — AB
Nitrite: NEGATIVE
Protein, ur: NEGATIVE mg/dL
Specific Gravity, Urine: 1.02 (ref 1.005–1.030)
pH: 5 (ref 5.0–8.0)

## 2020-03-22 LAB — HIV ANTIBODY (ROUTINE TESTING W REFLEX): HIV Screen 4th Generation wRfx: NONREACTIVE

## 2020-03-22 LAB — CBC
HCT: 27.6 % — ABNORMAL LOW (ref 36.0–46.0)
Hemoglobin: 8.9 g/dL — ABNORMAL LOW (ref 12.0–15.0)
MCH: 27.9 pg (ref 26.0–34.0)
MCHC: 32.2 g/dL (ref 30.0–36.0)
MCV: 86.5 fL (ref 80.0–100.0)
Platelets: 457 10*3/uL — ABNORMAL HIGH (ref 150–400)
RBC: 3.19 MIL/uL — ABNORMAL LOW (ref 3.87–5.11)
RDW: 15.1 % (ref 11.5–15.5)
WBC: 18.9 10*3/uL — ABNORMAL HIGH (ref 4.0–10.5)
nRBC: 0 % (ref 0.0–0.2)

## 2020-03-22 LAB — BETA-HYDROXYBUTYRIC ACID
Beta-Hydroxybutyric Acid: 0.2 mmol/L (ref 0.05–0.27)
Beta-Hydroxybutyric Acid: 0.37 mmol/L — ABNORMAL HIGH (ref 0.05–0.27)

## 2020-03-22 LAB — VITAMIN D 25 HYDROXY (VIT D DEFICIENCY, FRACTURES): Vit D, 25-Hydroxy: 25.93 ng/mL — ABNORMAL LOW (ref 30–100)

## 2020-03-22 LAB — SARS CORONAVIRUS 2 (TAT 6-24 HRS): SARS Coronavirus 2: NEGATIVE

## 2020-03-22 LAB — LACTIC ACID, PLASMA
Lactic Acid, Venous: 2.9 mmol/L (ref 0.5–1.9)
Lactic Acid, Venous: 1.6 mmol/L (ref 0.5–1.9)

## 2020-03-22 LAB — T4, FREE: Free T4: 0.83 ng/dL (ref 0.61–1.12)

## 2020-03-22 MED ORDER — NAFCILLIN SODIUM 2 G IJ SOLR: 2.0000 g | INTRAMUSCULAR | Status: DC

## 2020-03-22 MED ORDER — CEFAZOLIN SODIUM-DEXTROSE 2-4 GM/100ML-% IV SOLN
2.0000 g | Freq: Three times a day (TID) | INTRAVENOUS | Status: DC
  Filled 2020-03-22: qty 100

## 2020-03-22 MED ORDER — POTASSIUM CHLORIDE 10 MEQ/100ML IV SOLN
10.0000 meq | INTRAVENOUS | Status: AC
  Administered 2020-03-22 (×2): 10 meq via INTRAVENOUS
  Filled 2020-03-22 (×2): qty 100

## 2020-03-22 MED ORDER — DEXTROSE IN LACTATED RINGERS 5 % IV SOLN: INTRAVENOUS | Status: DC

## 2020-03-22 MED ORDER — ZOLEDRONIC ACID 4 MG/5ML IV CONC
4.0000 mg | Freq: Once | INTRAVENOUS | Status: AC
  Administered 2020-03-22: 4 mg via INTRAVENOUS
  Filled 2020-03-22: qty 5

## 2020-03-22 MED ORDER — SODIUM CHLORIDE 0.9 % IV SOLN
2.0000 g | INTRAVENOUS | Status: DC
  Administered 2020-03-22 – 2020-03-23 (×4): 2 g via INTRAVENOUS
  Filled 2020-03-22 (×6): qty 2000

## 2020-03-22 MED ORDER — INSULIN GLARGINE 100 UNIT/ML ~~LOC~~ SOLN
5.0000 [IU] | Freq: Every day | SUBCUTANEOUS | Status: DC
  Administered 2020-03-22 – 2020-03-24 (×3): 5 [IU] via SUBCUTANEOUS
  Filled 2020-03-22 (×3): qty 0.05

## 2020-03-22 MED ORDER — LACTATED RINGERS IV SOLN: INTRAVENOUS | Status: DC

## 2020-03-22 MED ORDER — INSULIN ASPART 100 UNIT/ML ~~LOC~~ SOLN
0.0000 [IU] | SUBCUTANEOUS | Status: DC
  Administered 2020-03-22 – 2020-03-23 (×2): 3 [IU] via SUBCUTANEOUS
  Administered 2020-03-23: 5 [IU] via SUBCUTANEOUS
  Administered 2020-03-23 (×2): 9 [IU] via SUBCUTANEOUS
  Administered 2020-03-24: 3 [IU] via SUBCUTANEOUS
  Administered 2020-03-24: 5 [IU] via SUBCUTANEOUS
  Administered 2020-03-24: 3 [IU] via SUBCUTANEOUS
  Administered 2020-03-24: 7 [IU] via SUBCUTANEOUS
  Administered 2020-03-24 (×2): 3 [IU] via SUBCUTANEOUS
  Administered 2020-03-24: 5 [IU] via SUBCUTANEOUS
  Administered 2020-03-25 (×2): 7 [IU] via SUBCUTANEOUS
  Administered 2020-03-25: 5 [IU] via SUBCUTANEOUS
  Filled 2020-03-22: qty 0.09

## 2020-03-22 MED ORDER — CALCITONIN (SALMON) 200 UNIT/ML IJ SOLN
4.0000 [IU]/kg | Freq: Two times a day (BID) | INTRAMUSCULAR | Status: AC
  Administered 2020-03-22 – 2020-03-23 (×3): 376 [IU] via INTRAMUSCULAR
  Filled 2020-03-22 (×4): qty 1.88

## 2020-03-22 NOTE — ED Notes (Signed)
Date and time results received: 03/22/20 10:05  Test: Calcium Critical Value: Greater than 15  Name of Provider Notified: attending

## 2020-03-22 NOTE — ED Notes (Addendum)
Pt has 15x5 cm bruise and superficial scratches on left hip, 6x4 cm bruise on left hip, and 15x8 cm bruise and superficial scratches on left lower ribs. Superficial scratches on right knee.   Pt alert and oriented to self, location, and said she fell at work. Knows the current and previous presidents but can't remember others. Can count backward from 20 to 16 with difficulty but stops at 16.

## 2020-03-22 NOTE — Assessment & Plan Note (Signed)
Stable Refill zoloft  

## 2020-03-22 NOTE — Progress Notes (Addendum)
[facility]  PROGRESS NOTE  Deborah Figueroa EUM:[id number] DOB: May 12, 1955 DOA: 04/02/2020 PCP: Ann Held, DO Admit date - 03/17/2020   Admitting Physician Jonnie Finner, DO  Outpatient Primary MD for the patient is Carollee Herter, Alferd Apa, DO  LOS - 1 Brief Narrative   Deborah Figueroa is a 65 y.o. year old female with medical history significant for poorly controlled Type 2 Diabetes, depression, HTN, HLD, recent history of recurrent falls requiring ED visits on 12/18 and 12/19 with no concerning findings on imaging at that time and treated supportively, as well as elevated calcium being evaluated as outpatient with concern for her likely hyperparathyroidism with plan to see surgeon on 1/12 who presented on 1/8 after having another fall in her hotel room and per chart review evaluated by EMS noted to have elevated CBG in the setting of increased thirst, polyuria and recently changed diabetic medications.  Patient is a poor historian as she fixates on her extreme level of thirst and is easily distractible.  Last Ed visit on 03/19/20 with XR of left elbow for evaluation of left elbow pain with recent fall and concerning for irregularity of the olecranon with possibility of underlying osteomyelitis and recommended MRI if clinically indicated. Lumbar spine XR, Right shoulder XR, and CT head at that time was negative for any acute findings except for periorbital hematoma  In the ED T-max 98.5, heart rate range 110-125, respiratory rate 24-30, initial blood pressure 140/84.  Glucose 650, CO2 13, calcium 13.3, anion gap 19, sodium 132.  UA unremarkable, beta hydroxybutyrate 6.21, pH 7.2 on VBG with CO2 35.7, COVID test negative, WBC 18, hemoglobin 7.7 (previous baseline 9.5 on 12/20).  Chest x-ray was nonacute.  Patient was started on IV insulin drip for management of DKA and type II diabetic as well as IV fluids for hypercalcemia of unclear etiology.  Hospital course complicated by blood  cultures obtained on admission positive for GPC in clusters in the blood culture ID detecting MSSA and patient started on cefazolin, as well as worsening calcium greater than 15 for which patient was increased on IV fluids, given calcitonin and zoledronic acid.   Subjective  In ED, focused on how thirsty she is and wants ice and water. States she has been falling in her hotel room. Denies any pain. Doesn't want to talk about what was going at home right now A & P   Sepsis secondary to MSSA bacteremia with high concern for endocarditis and septic phenomenon.   Left elbow Xray on 1/6 for evaluation of olecranon bursitis mentions concern for possible osteomyelitis (obvious erythema and tenderness on exam).  Initially SIRS criteria met on admission (leukocytosis, tachycardia, tachypnea) presumed to be inflammatory response to ongoing DKA now with blood cultures returning positive for MSSA.  UA unremarkable, chest x-ray unremarkable.  Hemodynamically stable, still has some persistent tachycardia likely related to dehydration from ongoing DKA and hypercalcemia but leukocytosis remains so infection likely contributing too. - Start IV cefazolin, but changed to nafcillin per ID for CNS coverage --MRI of left elbow given bacteremia and stability from DKA now - Will need TTE,  --ID consulted via bacteremia pathway, discussed with Dr. Juleen China will also get MRI Brain to rule out CNS septic emboli - We will need to repeat blood cultures to ensure resolution in 48 hours - Check lactic acid  ADDENDUM: MRI left eblow concerning for osteomyelitis of the olecranon and proximal ulna as well as concern for potential septic arthritis with  moderate joint effusion as well as proximal forearm flexor compartment intramuscular fluid collection with surrounding muscle edema concerning for abscess and additional concern for abscesses in the distal upper arm and the brachialis muscle. - Patient mains hemodynamically stable - IV  nafcillin area on board for MSSA bacteremia - Spoke with on call orthopedist for hand who will discuss with colleagues due to elbow involvement, no emergent need given hemodynamically stable  Severe hypercalcemia.  At baseline has elevated calcium of 12 and per outpatient notes with some concern for hyperparathyroidism but has not been evaluated formally.  Calcium has worsened on current regimen of IV fluids to greater than 15.  Vitamin D within normal limits, unable to obtain much history from patient she is alert and oriented x4 but does have some distal weakness in upper extremities - We will increase normal saline to 20 cc/h - Status post calcitonin - Status post low stomach acid this a.m., 1/9 - PTH RP, PTH pending - Low threshold to consult renal if output diminishes her kidney impairment worsens  Weakness with recurrent falls. Reports fall has been ongoing since September when she states her PCP was concerned with possible parathyroid issues. She's had multiple ED visits for the same. She has distal weakness in particular in her hands. She is oriented x4 but very slow in speech. Given concern for endocarditis in setting of bacteremia could also be CNS involvement causing weakness in discussion with ID --MRI Brain to rule out septic emboli to brain  Type 2 diabetes with DKA.  History of poor control and poor adherence to regimen.  A1c 10.4 On admission elevated butyrate acid, anion gap of 19, CBGs in 600 on admission, likely exacerbated in setting of worsening hypercalcemia and bacteremia of unclear etiology.  Anion gap is now closed, CBG 264.  Home regimen includes glipizide, Lantus 10 units - Currently on IV insulin drip, giving gap closed  I plan transition to Lantus 10 units if patient able to eat now - Continue to closely monitor CBGs, BMP every 4 - Currently still n.p.o.  AKI on CKD, likely prerenal in the setting of dehydration from DKA as well as some insult from sepsis physiology.   Further complicated by hypercalcemia.  Baseline creatinine from 09/2019 was 1.88, was 2.23 on admission, improved with IV fluids to 1.65 - Avoid nephrotoxins - Continue normal saline and closely monitor output - BMP daily  Acute on chronic anemia, normocytic. Baseline hemoglobin 9.5 on 02/14/2020).  On admission hemoglobin was 7.7, on repeat 8.9.  No reported bleeding episodes.  Could be acutely worse in the setting of illness - Continue closely monitor - Trend CBC, transfuse for hemoglobin less than 7 - Check iron panel, V95, folic acid  Suppressed TSH, likely euthyroid sick syndrome given free T4 within normal limits.  Likely in the setting of acute illness  Hypertension.  Initially hypertensive on admission however given ongoing sepsis physiology will hold off on home blood pressure medications - We will hold home lisinopril/HCTZ, Toprol  Depression, stable -continue home Zoloft     Family Communication  :  none  Code Status :  FULL  Disposition Plan  :  Patient is from home. Anticipated d/c date: > 3 days. Barriers to d/c or necessity for inpatient status: IV cefazolin for bacteremia, IVF and calcitonin and bisphosphonates for hypercalcemia, needs close monitoring of electrolytes and CBG for DKA and severe hypercalcemia and needs MRI to rule out oM of left elbow Consults  :  Infectious Disease  Procedures  :  Will need TTE and MRI of left elbow and brain   DVT Prophylaxis  :  SCDs, while evaluating anemia MDM: The below labs and imaging reports were reviewed and summarized above.  Medication management as above.  Lab Results  Component Value Date   PLT 457 (H) 03/22/2020    Diet :  Diet Order            Diet NPO time specified Except for: Sips with Meds  Diet effective now                  Inpatient Medications Scheduled Meds: . atorvastatin  40 mg Oral Daily  . calcitonin  4 Units/kg Intramuscular BID  . fenofibrate  160 mg Oral Daily   Continuous  Infusions: .  ceFAZolin (ANCEF) IV    . dextrose 5% lactated ringers 125 mL/hr at 03/22/20 0609  . insulin 13 Units/hr (03/22/20 1138)  . lactated ringers 200 mL/hr at 03/22/20 1237   PRN Meds:.dextrose, ondansetron **OR** ondansetron (ZOFRAN) IV  Antibiotics  :   Anti-infectives (From admission, onward)   Start     Dose/Rate Route Frequency Ordered Stop   03/22/20 1300  ceFAZolin (ANCEF) IVPB 2g/100 mL premix        2 g 200 mL/hr over 30 Minutes Intravenous Every 8 hours 03/22/20 1257         Objective   Vitals:   03/22/20 1130 03/22/20 1215 03/22/20 1219 03/22/20 1230  BP: 132/80 (!) 85/47 110/83 126/77  Pulse: (!) 104 (!) 102 97 99  Resp: (!) 22 (!) 30 20 (!) 24  Temp:      TempSrc:      SpO2: 98% 100% 97% 98%    SpO2: 98 %  Wt Readings from Last 3 Encounters:  03/19/20 93.9 kg  02/29/20 89.8 kg  11/12/19 100.2 kg     Intake/Output Summary (Last 24 hours) at 03/22/2020 1317 Last data filed at 03/22/2020 1128 Gross per 24 hour  Intake 600 ml  Output --  Net 600 ml    Physical Exam:     Awake Alert, Oriented to self, place, time, context, slow speech Moves lower extremities with no deficits Distal weakness in bilateral hands with weak hand grip No dysarthria or facial droop Following commands Normal respiratory effort on room air, CTAB RRR,No Gallops,Rubs or new Murmurs,  +ve B.Sounds, Abd Soft, No tenderness, No rebound, guarding or rigidity. Left elbow with circular area of erythema near olecranon and some tenderness Bruising of right periorbital area   I have personally reviewed the following:   Data Reviewed:  CBC Recent Labs  Lab 04/09/2020 1247 03/22/20 0924  WBC 18.0* 18.9*  HGB 7.7* 8.9*  HCT 24.7* 27.6*  PLT 469* 457*  MCV 91.8 86.5  MCH 28.6 27.9  MCHC 31.2 32.2  RDW 15.6* 15.1    Chemistries  Recent Labs  Lab 03/19/20 1047 04/13/2020 1247 03/22/20 0924  NA 127* 132* 136  K 4.5 3.8 4.0  CL 92* 100 106  CO2 21 13* 21*   GLUCOSE 589* 650* 334*  BUN 73* 73* 59*  CREATININE 2.17* 2.23* 1.65*  CALCIUM 13.9* 13.3* >15.0*  AST 10  --   --   ALT 18  --   --   ALKPHOS 125*  --   --   BILITOT 0.6  --   --    ------------------------------------------------------------------------------------------------------------------ No results for input(s): CHOL, HDL, LDLCALC, TRIG, CHOLHDL, LDLDIRECT in the last 72 hours.  Lab Results  Component Value Date   HGBA1C 10.4 (H) 03/19/2020   ------------------------------------------------------------------------------------------------------------------ No results for input(s): TSH, T4TOTAL, T3FREE, THYROIDAB in the last 72 hours.  Invalid input(s): FREET3 ------------------------------------------------------------------------------------------------------------------ No results for input(s): VITAMINB12, FOLATE, FERRITIN, TIBC, IRON, RETICCTPCT in the last 72 hours.  Coagulation profile No results for input(s): INR, PROTIME in the last 168 hours.  No results for input(s): DDIMER in the last 72 hours.  Cardiac Enzymes No results for input(s): CKMB, TROPONINI, MYOGLOBIN in the last 168 hours.  Invalid input(s): CK ------------------------------------------------------------------------------------------------------------------    Component Value Date/Time   BNP 47.8 02/14/2019 2147    Micro Results Recent Results (from the past 240 hour(s))  SARS CORONAVIRUS 2 (TAT 6-24 HRS) Nasopharyngeal Nasopharyngeal Swab     Status: None   Collection Time: 04/05/2020  2:13 PM   Specimen: Nasopharyngeal Swab  Result Value Ref Range Status   SARS Coronavirus 2 NEGATIVE NEGATIVE Final    Comment: (NOTE) SARS-CoV-2 target nucleic acids are NOT DETECTED.  The SARS-CoV-2 RNA is generally detectable in upper and lower respiratory specimens during the acute phase of infection. Negative results do not preclude SARS-CoV-2 infection, do not rule out co-infections with other  pathogens, and should not be used as the sole basis for treatment or other patient management decisions. Negative results must be combined with clinical observations, patient history, and epidemiological information. The expected result is Negative.  Fact Sheet for Patients: SugarRoll.be  Fact Sheet for Healthcare Providers: https://www.woods-mathews.com/  This test is not yet approved or cleared by the [place] FDA and  has been authorized for detection and/or diagnosis of SARS-CoV-2 by FDA under an Emergency Use Authorization (EUA). This EUA will remain  in effect (meaning this test can be used) for the duration of the COVID-19 declaration under Se ction 564(b)(1) of the Act, 21 U.S.C. section 360bbb-3(b)(1), unless the authorization is terminated or revoked sooner.  Performed at [facility], [place] [address] [place], [place] [address]   Culture, blood (routine x 2)     Status: None (Preliminary result)   Collection Time: 04/02/2020  5:08 PM   Specimen: BLOOD  Result Value Ref Range Status   Specimen Description   Final    BLOOD UNKNOWN Performed at [place] [address] [place], [place] [address]    Special Requests   Final    BOTTLES DRAWN AEROBIC AND ANAEROBIC Blood Culture adequate volume Performed at [place] [address] [place], [place] [address]    Culture  Setup Time   Final    GRAM POSITIVE COCCI IN BOTH AEROBIC AND ANAEROBIC BOTTLES Organism ID to follow CRITICAL RESULT CALLED TO, READ BACK BY AND VERIFIED WITH: PHARMD C.PIERCE AT 1200 ON 03/22/2020 BY T.SAAD Performed at [facility], [place] [address] [place],  [address]    Culture PENDING  Incomplete   Report Status PENDING  Incomplete  Blood Culture ID Panel (Reflexed)     Status: Abnormal   Collection Time: 04/12/2020  5:08 PM  Result Value Ref Range Status   Enterococcus faecalis NOT  DETECTED NOT DETECTED Final   Enterococcus Faecium NOT DETECTED NOT DETECTED Final   Listeria monocytogenes NOT DETECTED NOT DETECTED Final   Staphylococcus species DETECTED (A) NOT DETECTED Final    Comment: CRITICAL RESULT CALLED TO, READ BACK BY AND VERIFIED WITH: PHARMD C.PIERCE AT 1200 ON 03/22/2020 BY T.SAAD    Staphylococcus aureus (BCID) DETECTED (A) NOT DETECTED Final  Comment: CRITICAL RESULT CALLED TO, READ BACK BY AND VERIFIED WITH: PHARMD C.PIERCE AT 1200 ON 03/22/2020 BY T.SAAD    Staphylococcus epidermidis NOT DETECTED NOT DETECTED Final   Staphylococcus lugdunensis NOT DETECTED NOT DETECTED Final   Streptococcus species NOT DETECTED NOT DETECTED Final   Streptococcus agalactiae NOT DETECTED NOT DETECTED Final   Streptococcus pneumoniae NOT DETECTED NOT DETECTED Final   Streptococcus pyogenes NOT DETECTED NOT DETECTED Final   A.calcoaceticus-baumannii NOT DETECTED NOT DETECTED Final   Bacteroides fragilis NOT DETECTED NOT DETECTED Final   Enterobacterales NOT DETECTED NOT DETECTED Final   Enterobacter cloacae complex NOT DETECTED NOT DETECTED Final   Escherichia coli NOT DETECTED NOT DETECTED Final   Klebsiella aerogenes NOT DETECTED NOT DETECTED Final   Klebsiella oxytoca NOT DETECTED NOT DETECTED Final   Klebsiella pneumoniae NOT DETECTED NOT DETECTED Final   Proteus species NOT DETECTED NOT DETECTED Final   Salmonella species NOT DETECTED NOT DETECTED Final   Serratia marcescens NOT DETECTED NOT DETECTED Final   Haemophilus influenzae NOT DETECTED NOT DETECTED Final   Neisseria meningitidis NOT DETECTED NOT DETECTED Final   Pseudomonas aeruginosa NOT DETECTED NOT DETECTED Final   Stenotrophomonas maltophilia NOT DETECTED NOT DETECTED Final   Candida albicans NOT DETECTED NOT DETECTED Final   Candida auris NOT DETECTED NOT DETECTED Final   Candida glabrata NOT DETECTED NOT DETECTED Final   Candida krusei NOT DETECTED NOT DETECTED Final   Candida parapsilosis NOT  DETECTED NOT DETECTED Final   Candida tropicalis NOT DETECTED NOT DETECTED Final   Cryptococcus neoformans/gattii NOT DETECTED NOT DETECTED Final   Meth resistant mecA/C and MREJ NOT DETECTED NOT DETECTED Final    Comment: Performed at [facility], [address] [place], [place] [address]    Radiology Reports DG Lumbar Spine Complete  Result Date: 03/19/2020 CLINICAL DATA:  History of multiple falls with low back pain, initial encounter EXAM: LUMBAR SPINE - COMPLETE 4+ VIEW COMPARISON:  None. FINDINGS: Six non rib-bearing lumbar type vertebral bodies are visualized. Changes of prior fusion at L5-6 is noted. Mild osteophytic changes are seen. Facet hypertrophic changes are noted. No soft tissue abnormality is seen. IMPRESSION: Degenerative and postoperative changes without acute abnormality. Electronically Signed   By: Inez Catalina M.D.   On: 03/19/2020 12:29   DG Shoulder Right  Result Date: 03/19/2020 CLINICAL DATA:  Multiple recent falls with right shoulder pain, initial encounter EXAM: RIGHT SHOULDER - 2+ VIEW COMPARISON:  None. FINDINGS: No acute fracture or dislocation is noted. No soft tissue abnormality is seen. Old healed right rib fractures. IMPRESSION: No acute abnormality noted. Electronically Signed   By: Inez Catalina M.D.   On: 03/19/2020 12:21   DG Elbow Complete Left  Result Date: 03/19/2020 CLINICAL DATA:  History of multiple falls with left elbow pain, initial encounter EXAM: LEFT ELBOW - COMPLETE 3+ VIEW COMPARISON:  None. FINDINGS: No acute fracture or dislocation is noted. Considerable soft tissue swelling is noted over the olecranon with scattered soft tissue calcifications likely related to olecranon bursitis. The posterior aspect of the olecranon appears somewhat eroded. Possibility of osteomyelitis would deserve consideration IMPRESSION: Changes most consistent with olecranon bursitis. Some irregularity of the olecranon on the lateral film is noted. Possibility of  underlying osteomyelitis deserves consideration. MRI may be helpful as clinically indicated. Electronically Signed   By: Inez Catalina M.D.   On: 03/19/2020 12:24   CT Head Wo Contrast  Result Date: 03/19/2020 CLINICAL DATA:  Head trauma, initial encounter. Facial  trauma; head trauma, minor, normal mental status. Additional history provided: Patient reports fall 2 weeks ago at work hitting back of head, fall 2 days ago on sidewalk hitting right forehead, swelling/contusion to right eye. EXAM: CT HEAD WITHOUT CONTRAST TECHNIQUE: Contiguous axial images were obtained from the base of the skull through the vertex without intravenous contrast. COMPARISON:  Head CT 02/09/2019. FINDINGS: Brain: Cerebral volume is normal for age. There is no acute intracranial hemorrhage. No demarcated cortical infarct. No extra-axial fluid collection. No evidence of intracranial mass. No midline shift. Vascular: No hyperdense vessel. Skull: Normal. Negative for fracture or focal lesion. Sinuses/Orbits: Visualized orbits show no acute finding. No significant paranasal sinus disease at the imaged levels. Other: Partially imaged forehead/right periorbital hematoma. IMPRESSION: Unremarkable non-contrast CT appearance of the brain for age. No evidence of acute intracranial abnormality. Partially imaged forehead/right periorbital hematoma. Electronically Signed   By: Kellie Simmering DO   On: 03/19/2020 11:48   US RENAL  Result Date: 04/09/2020 CLINICAL DATA:  Acute renal insufficiency. EXAM: RENAL / URINARY TRACT ULTRASOUND COMPLETE COMPARISON:  None. FINDINGS: Right Kidney: Renal measurements: 10.6 x 4.4 x 4.8 cm = volume: 117.1 mL. No significant hydronephrosis. Left Kidney: Renal measurements: 10.7 x 5.2 x 4.7 cm = volume: 137 mL. Contains a 1.4 cm cyst. Bladder: Appears normal for degree of bladder distention. Other: Cholelithiasis. IMPRESSION: 1. No cause for acute renal insufficiency. 2. Cholelithiasis. Electronically Signed   By: Dorise Bullion III M.D   On: 03/16/2020 19:00   DG CHEST PORT 1 VIEW  Result Date: 03/25/2020 CLINICAL DATA:  Leukocytosis EXAM: PORTABLE CHEST 1 VIEW COMPARISON:  February 15, 2019 FINDINGS: The cardiomediastinal silhouette is unchanged in contour.Atherosclerotic calcifications of the aorta. Low lung volumes with bronchovascular crowding. No pleural effusion. No pneumothorax. No acute pleuroparenchymal abnormality. Visualized abdomen is unremarkable. Mild degenerative changes of the thoracic spine. IMPRESSION: No acute cardiopulmonary abnormality. Electronically Signed   By: Valentino Saxon MD   On: 04/01/2020 17:09     Time Spent in minutes  30     Desiree Hane M.D on 03/22/2020 at 1:17 PM  To page go to [email or web address] - password [facility]

## 2020-03-22 NOTE — Progress Notes (Signed)
Pt removed imaging equipment towards end of elbow MRI, refused further imaging. Also was unable to hold still during exam.

## 2020-03-22 NOTE — Assessment & Plan Note (Signed)
Well controlled, no changes to meds. Encouraged heart healthy diet such as the DASH diet and exercise as tolerated.  meds refiled for pt

## 2020-03-22 NOTE — Care Plan (Addendum)
Patient's most recent CBG 230. But not eating. Still getting D5 LR due to hypercalcemia. Started scheduled sliding scale for NPO status and started reduce dose of Lantus at 5 U for tonight as she is insulin naivete and want to avoid any hypoglycemia.  Also discussed her case with orthopedics they need a better MRI because there is concern for abscesses and not so much septic arthritis given she has adequate movement of her ar. Will need repeat MRI of entire arm for better localization of abscesses.  I'm hopeful with improvement of her calcium with fluids and repeat dose of calcitonin that will help in her following directions for the MRI to allow for potential surgical intervention on 04/13/2020.   Deborah Figueroa

## 2020-03-22 NOTE — ED Notes (Signed)
Patient transported to MRI 

## 2020-03-22 NOTE — Assessment & Plan Note (Signed)
Check ct today due to frequent falls  Pt feels its because of the parathyroid--and running out of meds  We made sure pt had all her meds today and will check labs app with surgeon is on 1/12

## 2020-03-22 NOTE — Assessment & Plan Note (Signed)
Tolerating statin, encouraged heart healthy diet, avoid trans fats, minimize simple carbs and saturated fats. Increase exercise as tolerated 

## 2020-03-22 NOTE — Consult Note (Signed)
[facility]    Date of Admission:  03/19/2020     Reason for Consult: MSSA bacteremia     Referring Physician: Garwin Brothers consult  Current antibiotics: Day 1 Nafcillin  ASSESSMENT:    MSSA Bacteremia Falls Left Elbow Pain, Right Scapular Pain DKA Acute Kidney Injury on CKD Leukocytosis Severe hypercalcemia  65 year old woman presenting with diabetic ketoacidosis and acute renal failure found to have MSSA bacteremia of unclear etiology.  Highly suspicious for endocarditis and possible metastatic infection involving the CNS, left elbow, and possibly right scapular/shoulder area.  PLAN:    -- Due to concern for CNS involvement, will start nafcillin 2 g every 4 hours -- Transthoracic echo -- MRI brain -- MRI left elbow -- Ensure second set of blood cultures obtained prior to antibiotics and then repeat blood cultures approximately 48 hours after starting antibiotics -- Low threshold for further advanced imaging to evaluate for other foci of infection based on patient history and physical exam, particularly right shoulder -- Will follow  HPI:    Deborah Figueroa is a 66 y.o. female with past medical history significant for type 2 diabetes (recent hemoglobin A1c 10.4), hypertension, hyperlipidemia, depression, and recent history of falls who presented to the ED yesterday with ongoing weakness.  She was found to be in DKA with acute renal failure.  She was started on an insulin drip and is being admitted to the hospital, but currently boarding in the ED.  On admission she was also found to have leukocytosis without fevers.  Blood cultures were obtained (only 1 set of cultures) and this is now positive for MSSA in both anaerobic and aerobic bottles.  Patient has had recent issues over the past several weeks with falls where she has injured herself and been evaluated in the emergency department as a result.  During these ED visits she has been deemed stable for  discharge and sent home.  During her ED evaluation this admission she complained of left elbow pain.  X-ray showed changes consistent with olecranon bursitis, however, some irregularity of the olecranon was noted concerning for possible osteomyelitis.  She also had right shoulder/scapular pain for which an x-ray was obtained that showed no acute abnormality.  CT head on January 6 after one of her falls was unremarkable with the exception of a partially seen right periorbital hematoma.  She denies any history of injection drug use, EtOH use disorder, tobacco use disorder.  She states that she lives alone.   Past Medical History:  Diagnosis Date  . Cellulitis   . Depression   . Hyperlipidemia   . Hypertension   . Seasonal allergies     Social History   Tobacco Use  . Smoking status: Never Smoker  . Smokeless tobacco: Never Used  Vaping Use  . Vaping Use: Never used  Substance Use Topics  . Alcohol use: No  . Drug use: No    Family History  Problem Relation Age of Onset  . Stroke Mother   . Hypertension Mother   . Hyperlipidemia Mother   . Cancer Father 68       liver and colon  . Depression Father   . Cancer Maternal Aunt        breast  . Diabetes Brother   . Cancer Maternal Grandmother        breast    No Known Allergies  Review of Systems  Constitutional: Positive for malaise/fatigue. Negative for chills and fever.  HENT:  Negative.   Eyes: Negative.   Respiratory: Negative for cough and shortness of breath.   Cardiovascular: Negative.   Gastrointestinal: Negative.   Genitourinary: Positive for frequency. Negative for dysuria.  Musculoskeletal: Positive for back pain, falls and joint pain.       She complains of right scapular pain and left elbow pain.  Skin: Negative.   Neurological: Negative.   Psychiatric/Behavioral: Negative.   All other systems reviewed and are negative.   OBJECTIVE:   Blood pressure 126/77, pulse 99, temperature 97.7 F (36.5 C),  temperature source Oral, resp. rate (!) 24, SpO2 98 %. There is no height or weight on file to calculate BMI.  Physical Exam Constitutional:      General: She is not in acute distress.    Comments: Ill appearing woman, lying in hospital bed.   HENT:     Head:     Comments: Periorbital bruising noted. Eyes:     General: No scleral icterus.    Extraocular Movements: Extraocular movements intact.  Cardiovascular:     Rate and Rhythm: Normal rate and regular rhythm.     Heart sounds: Murmur heard.    Pulmonary:     Effort: Pulmonary effort is normal. No respiratory distress.     Breath sounds: Normal breath sounds.  Abdominal:     General: There is distension.     Palpations: Abdomen is soft.     Tenderness: There is no abdominal tenderness.  Musculoskeletal:        General: Tenderness present.     Cervical back: Normal range of motion and neck supple.     Right lower leg: No edema.     Left lower leg: No edema.     Comments: Right second hammertoe deformity. Left elbow tenderness and discomfort with range of motion.  No obvious warmth, fluctuance, erythema.  Skin:    General: Skin is warm and dry.  Neurological:     General: No focal deficit present.     Mental Status: She is oriented to person, place, and time.     Comments: She is alert and oriented x3, however, she is encephalopathic     Lab Results: Lab Results  Component Value Date   WBC 18.9 (H) 03/22/2020   HGB 8.9 (L) 03/22/2020   HCT 27.6 (L) 03/22/2020   MCV 86.5 03/22/2020   PLT 457 (H) 03/22/2020    Lab Results  Component Value Date   NA 136 03/22/2020   K 4.0 03/22/2020   CO2 21 (L) 03/22/2020   GLUCOSE 334 (H) 03/22/2020   BUN 59 (H) 03/22/2020   CREATININE 1.65 (H) 03/22/2020   CALCIUM >15.0 (HH) 03/22/2020   GFRNONAA 34 (L) 03/22/2020   GFRAA 25 (L) 02/14/2019    Lab Results  Component Value Date   ALT 18 03/19/2020   AST 10 03/19/2020   ALKPHOS 125 (H) 03/19/2020   BILITOT 0.6  03/19/2020    I have reviewed the micro and lab results in Epic.  Imaging: US RENAL  Result Date: 03/19/2020 CLINICAL DATA:  Acute renal insufficiency. EXAM: RENAL / URINARY TRACT ULTRASOUND COMPLETE COMPARISON:  None. FINDINGS: Right Kidney: Renal measurements: 10.6 x 4.4 x 4.8 cm = volume: 117.1 mL. No significant hydronephrosis. Left Kidney: Renal measurements: 10.7 x 5.2 x 4.7 cm = volume: 137 mL. Contains a 1.4 cm cyst. Bladder: Appears normal for degree of bladder distention. Other: Cholelithiasis. IMPRESSION: 1. No cause for acute renal insufficiency. 2. Cholelithiasis. Electronically Signed   By:  Dorise Bullion III M.D   On: 03/24/2020 19:00   DG CHEST PORT 1 VIEW  Result Date: 03/28/2020 CLINICAL DATA:  Leukocytosis EXAM: PORTABLE CHEST 1 VIEW COMPARISON:  February 15, 2019 FINDINGS: The cardiomediastinal silhouette is unchanged in contour.Atherosclerotic calcifications of the aorta. Low lung volumes with bronchovascular crowding. No pleural effusion. No pneumothorax. No acute pleuroparenchymal abnormality. Visualized abdomen is unremarkable. Mild degenerative changes of the thoracic spine. IMPRESSION: No acute cardiopulmonary abnormality. Electronically Signed   By: Valentino Saxon MD   On: 03/18/2020 17:09     Imaging  independently reviewed in Epic.  [facility] [phone] pager 03/22/2020, 1:03 PM

## 2020-03-22 NOTE — Evaluation (Signed)
Occupational Therapy Evaluation Patient Details Name: Deborah Figueroa MRN: [id number] DOB: [birth year] Today's Date: 03/22/2020    History of Present Illness 65 year old woman presenting with diabetic ketoacidosis and acute renal failure found to have MSSA bacteremia of unclear etiology. PMH of HTN, HLD, and IDDM who presents the ED via EMS for weakness and hyperglycemia.   Clinical Impression   PTA, pt was living at a hotel, but pt reports she is not anymore. Pt reports she was independent with ADL/IADL and functional mobility. Pt currently limited secondary to lethargy and pain in RUE. Pt demonstrates cognitive (see cognition section) and physical limitations impacting safety and independence with ADL/IADL and functional mobility. She was unable to progress to EOB this date secondary to increased pain and cognitive limitations.  Due to decline in current level of function, pt would benefit from acute OT to address established goals to facilitate safe D/C to venue listed below. At this time, recommend SNF follow-up. Will continue to follow acutely.     Follow Up Recommendations  SNF;Supervision/Assistance - 24 hour    Equipment Recommendations  Other (comment) (tbd)    Recommendations for Other Services PT consult     Precautions / Restrictions Precautions Precautions: Fall Precaution Comments: watch RUE pain with mobility, pending MRI Restrictions Weight Bearing Restrictions: No      Mobility Bed Mobility Overal bed mobility: Needs Assistance Bed Mobility: Rolling Rolling: Max assist         General bed mobility comments: pt would require maxA for rolling;limited this session secondary  to c/o increased pain    Transfers                 General transfer comment: deferred    Balance                                           ADL either performed or assessed with clinical judgement   ADL Overall ADL's : Needs  assistance/impaired Eating/Feeding: Minimal assistance   Grooming: Moderate assistance   Upper Body Bathing: Maximal assistance   Lower Body Bathing: Maximal assistance   Upper Body Dressing : Moderate assistance Upper Body Dressing Details (indicate cue type and reason): modA to don gown Lower Body Dressing: Maximal assistance     Toilet Transfer Details (indicate cue type and reason): deferred           General ADL Comments: attempted to progress pt to EOB, pt unable to roll onto her side due to limitations with motor planning and increased pain with movement, limited assessment     Vision   Additional Comments: pt kept eyes shut majority of session     Perception     Praxis      Pertinent Vitals/Pain Pain Assessment: Faces Faces Pain Scale: Hurts even more Pain Location: RUE with mobility Pain Descriptors / Indicators: Grimacing;Guarding Pain Intervention(s): Limited activity within patient's tolerance;Monitored during session     Hand Dominance Right   Extremity/Trunk Assessment Upper Extremity Assessment Upper Extremity Assessment: RUE deficits/detail;LUE deficits/detail RUE Deficits / Details: limited secondary to increased pain;noted increased edema;Pt had 3/5 grip strength, elbow AROM full extension limited flexion, decreased shoulder ROM secondary to pain RUE: Unable to fully assess due to pain RUE Coordination: decreased fine motor;decreased gross motor LUE Deficits / Details: grip strength 4/5;full elbow ROM, limited assessment secondary to cognition, pt not following one step commands consistently LUE  Coordination: decreased fine motor;decreased gross motor   Lower Extremity Assessment Lower Extremity Assessment: Generalized weakness;Defer to PT evaluation       Communication Communication Communication: No difficulties   Cognition Arousal/Alertness: Lethargic Behavior During Therapy: Flat affect Overall Cognitive Status: Impaired/Different from  baseline Area of Impairment: Orientation;Attention;Memory;Following commands;Safety/judgement;Awareness;Problem solving                 Orientation Level: Disoriented to;Time Current Attention Level: Sustained Memory: Decreased short-term memory Following Commands: Follows one step commands inconsistently;Follows one step commands with increased time Safety/Judgement: Decreased awareness of safety;Decreased awareness of deficits Awareness: Intellectual Problem Solving: Slow processing;Difficulty sequencing;Requires verbal cues;Requires tactile cues General Comments: attempted to progress pt to roll on her left side, pt demonstrated limitations with motor planning requiring cues for sequencing. Pt counted backwards from 20-16 but stopped at 16. Pt aware she is at [facility], not oriented to date stated it was April. 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 Pt stating she was very tired throughout session and frequently stating "I can't"   General Comments  noted bruising around pt's eyes and on right side of forehead;noted wounds on pt's right knee    Exercises     Shoulder Instructions      Home Living Family/patient expects to be discharged to:: Unsure                                 Additional Comments: pt reports she was living in a hotel, but is not anymore      Prior Functioning/Environment Level of Independence: Independent        Comments: pt reports she was independent with ADL and functional mobility        OT Problem List: Decreased strength;Decreased range of motion;Decreased activity tolerance;Impaired balance (sitting and/or standing);Decreased cognition;Decreased safety awareness;Decreased knowledge of use of DME or AE;Decreased knowledge of precautions;Impaired UE functional use;Pain      OT Treatment/Interventions: Self-care/ADL training;Therapeutic exercise;Energy conservation;DME and/or AE instruction;Therapeutic activities;Patient/family  education;Balance training;Cognitive remediation/compensation    OT Goals(Current goals can be found in the care plan section) Acute Rehab OT Goals Patient Stated Goal: to sleep OT Goal Formulation: With patient Time For Goal Achievement: 04/05/20 Potential to Achieve Goals: Good ADL Goals Pt Will Perform Grooming: with set-up;standing;sitting Pt Will Perform Lower Body Dressing: with min guard assist;sit to/from stand Pt Will Transfer to Toilet: with min guard assist;ambulating Additional ADL Goal #1: Pt will complete multistep cognition task with <3 errors for safe engagement in ADL/IADL.  OT Frequency: Min 2X/week   Barriers to D/C:            Co-evaluation              AM-PAC OT "6 Clicks" Daily Activity     Outcome Measure Help from another person eating meals?: A Little Help from another person taking care of personal grooming?: A Lot Help from another person toileting, which includes using toliet, bedpan, or urinal?: Total Help from another person bathing (including washing, rinsing, drying)?: A Lot Help from another person to put on and taking off regular upper body clothing?: A Lot Help from another person to put on and taking off regular lower body clothing?: A Lot 6 Click Score: 12   End of Session Nurse Communication: Mobility status  Activity Tolerance: Patient limited by lethargy;Patient limited by pain Patient left: in bed;with call bell/phone within reach;with bed alarm set;with nursing/sitter in room  OT  Visit Diagnosis: Other abnormalities of gait and mobility (R26.89);Muscle weakness (generalized) (M62.81);Other symptoms and signs involving cognitive function;Pain Pain - Right/Left: Right Pain - part of body: Arm;Shoulder                Time: 7374-9664 OT Time Calculation (min): 16 min Charges:  OT General Charges $OT Visit: 1 Visit OT Evaluation $OT Eval Moderate Complexity: [place] OTR/L Acute Rehabilitation Services Office:  [place] 03/22/2020, 4:07 PM

## 2020-03-22 NOTE — Assessment & Plan Note (Signed)
Check labs today Pt needs to make f/u app with endo

## 2020-03-22 NOTE — Consult Note (Signed)
Reason for Consult:Concern of left elbow infection Referring Physician: Gema Figueroa is an 65 y.o. female.  HPI: 65 year old diabetic female who was reportedly living at a hotel.  She has had multiple recent falls and poorly controlled blood sugars.  She presented to the emergency department and was found to be septic with OSSA In blood cultures.  Concern of pain in the left elbow led to an MRI which she was only able to tolerate for a short period.  The MRI was of poor quality but showed concern for possible osteomyelitis of the tip of the olecranon, joint effusion and partially visualized areas of likely abscess in both the proximal forearm and distal upper arm.  Past Medical History:  Diagnosis Date  . Cellulitis   . Depression   . Hyperlipidemia   . Hypertension   . Seasonal allergies     Past Surgical History:  Procedure Laterality Date  . BACK SURGERY    . BREAST SURGERY     lumpectomy left breast  . LUMBAR FUSION      Family History  Problem Relation Age of Onset  . Stroke Mother   . Hypertension Mother   . Hyperlipidemia Mother   . Cancer Father 84       liver and colon  . Depression Father   . Cancer Maternal Aunt        breast  . Diabetes Brother   . Cancer Maternal Grandmother        breast    Social History:  reports that she has never smoked. She has never used smokeless tobacco. She reports that she does not drink alcohol and does not use drugs.  Allergies: No Known Allergies  Medications: I have reviewed the patient's current medications.  Results for orders placed or performed during the hospital encounter of 03/19/2020 (from the past 48 hour(s))  Beta-hydroxybutyric acid     Status: None   Collection Time: 04/04/2020  1:30 AM  Result Value Ref Range   Beta-Hydroxybutyric Acid 0.20 0.05 - 0.27 mmol/L    Comment: Performed at [facility], [place] [address] [place], [place] [address]  HIV Antibody (routine testing w rflx)      Status: None   Collection Time: 04/13/2020  1:30 AM  Result Value Ref Range   HIV Screen 4th Generation wRfx Non Reactive Non Reactive    Comment: Performed at [facility], [place] [address], [place], [place] [address]  T4, free     Status: None   Collection Time: 04/03/2020  1:30 AM  Result Value Ref Range   Free T4 0.83 0.61 - 1.12 ng/dL    Comment: (NOTE) Biotin ingestion may interfere with free T4 tests. If the results are inconsistent with the TSH level, previous test results, or the clinical presentation, then consider biotin interference. If needed, order repeat testing after stopping biotin. Performed at [facility], [place] [address] [place], [place] [address]   VITAMIN D 25 Hydroxy (Vit-D Deficiency, Fractures)     Status: Abnormal   Collection Time: 03/20/2020  1:38 AM  Result Value Ref Range   Vit D, 25-Hydroxy 25.93 (L) 30 - 100 ng/mL    Comment: (NOTE) Vitamin D deficiency has been defined by the Institute of Medicine  and an Endocrine Society practice guideline as a level of serum 25-OH  vitamin D less than 20 ng/mL (1,2). The Endocrine Society went on to  further define vitamin D insufficiency as a level  between 21 and 29  ng/mL (2).  1. IOM (Institute of Medicine). 2010. Dietary reference intakes for  calcium and D. [place]: The [employer]. 2. Holick MF, Bink[place], Bischoff-Ferrari HA, et al. Evaluation,  treatment, and prevention of vitamin D deficiency: an Endocrine  Society clinical practice guideline, JCEM. 2011 Jul; 96(7): 1911-30.  Performed at [facility], [place] [address], [place], [place] [address]   CBG monitoring, ED     Status: Abnormal   Collection Time: 03/20/2020 12:45 PM  Result Value Ref Range   Glucose-Capillary 537 (HH) 70 - 99 mg/dL    Comment: Glucose reference range applies only to samples taken after fasting for at least 8 hours.   Comment 1 Notify RN   Basic metabolic panel     Status: Abnormal    Collection Time: 03/27/2020 12:47 PM  Result Value Ref Range   Sodium 132 (L) 135 - 145 mmol/L   Potassium 3.8 3.5 - 5.1 mmol/L   Chloride 100 98 - 111 mmol/L   CO2 13 (L) 22 - 32 mmol/L   Glucose, Bld 650 (HH) 70 - 99 mg/dL    Comment: Glucose reference range applies only to samples taken after fasting for at least 8 hours. CRITICAL RESULT CALLED TO, READ BACK BY AND VERIFIED WITH: GREEN,G.MD AT 1349 04/03/2020 MULLINS,T    BUN 73 (H) 8 - 23 mg/dL   Creatinine, Ser 2.23 (H) 0.44 - 1.00 mg/dL   Calcium 13.3 (HH) 8.9 - 10.3 mg/dL    Comment: CRITICAL RESULT CALLED TO, READ BACK BY AND VERIFIED WITH: GREEN,G.MD AT 1349 04/05/2020 MULLINS,T    GFR, Estimated 24 (L) >60 mL/min    Comment: (NOTE) Calculated using the CKD-EPI Creatinine Equation (2021)    Anion gap 19 (H) 5 - 15    Comment: Performed at [facility], [place] [address], [place], [place] [address]  CBC     Status: Abnormal   Collection Time: 03/25/2020 12:47 PM  Result Value Ref Range   WBC 18.0 (H) 4.0 - 10.5 K/uL   RBC 2.69 (L) 3.87 - 5.11 MIL/uL   Hemoglobin 7.7 (L) 12.0 - 15.0 g/dL   HCT 24.7 (L) 36.0 - 46.0 %   MCV 91.8 80.0 - 100.0 fL   MCH 28.6 26.0 - 34.0 pg   MCHC 31.2 30.0 - 36.0 g/dL   RDW 15.6 (H) 11.5 - 15.5 %   Platelets 469 (H) 150 - 400 K/uL   nRBC 0.0 0.0 - 0.2 %    Comment: Performed at [facility], [place] [address] [place], [place] [address]  Beta-hydroxybutyric acid     Status: Abnormal   Collection Time: 03/16/2020  2:13 PM  Result Value Ref Range   Beta-Hydroxybutyric Acid 6.21 (H) 0.05 - 0.27 mmol/L    Comment: RESULTS CONFIRMED BY MANUAL DILUTION Performed at [place] [address], [place], [place] [address]   Blood gas, venous     Status: Abnormal   Collection Time: 04/02/2020  2:13 PM  Result Value Ref Range   FIO2 21.00    pH, Ven 7.233 (L) 7.250 - 7.430   pCO2, Ven 35.7 (L) 44.0 - 60.0 mmHg   pO2, Ven 64.8 (H) 32.0 - 45.0 mmHg    Bicarbonate 14.6 (L) 20.0 - 28.0 mmol/L   Acid-base deficit 11.6 (H) 0.0 - 2.0 mmol/L   O2 Saturation 86.3 %   Patient temperature 98.6     Comment: Performed at  [facility], [place] [address], [place], [place] [address]  SARS CORONAVIRUS 2 (TAT 6-24 HRS) Nasopharyngeal Nasopharyngeal Swab     Status: None   Collection Time: 03/23/2020  2:13 PM   Specimen: Nasopharyngeal Swab  Result Value Ref Range   SARS Coronavirus 2 NEGATIVE NEGATIVE    Comment: (NOTE) SARS-CoV-2 target nucleic acids are NOT DETECTED.  The SARS-CoV-2 RNA is generally detectable in upper and lower respiratory specimens during the acute phase of infection. Negative results do not preclude SARS-CoV-2 infection, do not rule out co-infections with other pathogens, and should not be used as the sole basis for treatment or other patient management decisions. Negative results must be combined with clinical observations, patient history, and epidemiological information. The expected result is Negative.  Fact Sheet for Patients: SugarRoll.be  Fact Sheet for Healthcare Providers: https://www.woods-mathews.com/  This test is not yet approved or cleared by the [place] FDA and  has been authorized for detection and/or diagnosis of SARS-CoV-2 by FDA under an Emergency Use Authorization (EUA). This EUA will remain  in effect (meaning this test can be used) for the duration of the COVID-19 declaration under Se ction 564(b)(1) of the Act, 21 U.S.C. section 360bbb-3(b)(1), unless the authorization is terminated or revoked sooner.  Performed at [facility], [place] [address], [place], [place] [address]   CBG monitoring, ED     Status: Abnormal   Collection Time: 04/10/2020  3:20 PM  Result Value Ref Range   Glucose-Capillary 518 (HH) 70 - 99 mg/dL    Comment: Glucose reference range applies only to samples taken after fasting for at least 8 hours.   Comment 1  Notify RN    Comment 2 Document in Chart   CBG monitoring, ED     Status: Abnormal   Collection Time: 04/12/2020  4:57 PM  Result Value Ref Range   Glucose-Capillary 472 (H) 70 - 99 mg/dL    Comment: Glucose reference range applies only to samples taken after fasting for at least 8 hours.  Culture, blood (routine x 2)     Status: None (Preliminary result)   Collection Time: 04/11/2020  5:08 PM   Specimen: BLOOD  Result Value Ref Range   Specimen Description      BLOOD UNKNOWN Performed at [facility] [address], [place], [place] [address]    Special Requests      BOTTLES DRAWN AEROBIC AND ANAEROBIC Blood Culture adequate volume Performed at [facility] [address], [place], [place] [address]    Culture  Setup Time      GRAM POSITIVE COCCI IN BOTH AEROBIC AND ANAEROBIC BOTTLES Organism ID to follow CRITICAL RESULT CALLED TO, READ BACK BY AND VERIFIED WITH: PHARMD C.PIERCE AT 1200 ON 03/22/2020 BY T.SAAD    Culture      NO GROWTH < 24 HOURS Performed at [facility], [place] [address], [place], [place] [address]    Report Status PENDING   Blood Culture ID Panel (Reflexed)     Status: Abnormal   Collection Time: 04/10/2020  5:08 PM  Result Value Ref Range   Enterococcus faecalis NOT DETECTED NOT DETECTED   Enterococcus Faecium NOT DETECTED NOT DETECTED   Listeria monocytogenes NOT DETECTED NOT DETECTED   Staphylococcus species DETECTED (A) NOT DETECTED    Comment: CRITICAL RESULT CALLED TO, READ BACK BY AND VERIFIED WITH: PHARMD C.PIERCE AT 1200 ON 03/22/2020 BY T.SAAD    Staphylococcus aureus (BCID) DETECTED (  A) NOT DETECTED    Comment: CRITICAL RESULT CALLED TO, READ BACK BY AND VERIFIED WITH: PHARMD C.PIERCE AT 1200 ON 03/22/2020 BY T.SAAD    Staphylococcus epidermidis NOT DETECTED NOT DETECTED   Staphylococcus lugdunensis NOT DETECTED NOT DETECTED   Streptococcus species NOT DETECTED NOT DETECTED   Streptococcus agalactiae  NOT DETECTED NOT DETECTED   Streptococcus pneumoniae NOT DETECTED NOT DETECTED   Streptococcus pyogenes NOT DETECTED NOT DETECTED   A.calcoaceticus-baumannii NOT DETECTED NOT DETECTED   Bacteroides fragilis NOT DETECTED NOT DETECTED   Enterobacterales NOT DETECTED NOT DETECTED   Enterobacter cloacae complex NOT DETECTED NOT DETECTED   Escherichia coli NOT DETECTED NOT DETECTED   Klebsiella aerogenes NOT DETECTED NOT DETECTED   Klebsiella oxytoca NOT DETECTED NOT DETECTED   Klebsiella pneumoniae NOT DETECTED NOT DETECTED   Proteus species NOT DETECTED NOT DETECTED   Salmonella species NOT DETECTED NOT DETECTED   Serratia marcescens NOT DETECTED NOT DETECTED   Haemophilus influenzae NOT DETECTED NOT DETECTED   Neisseria meningitidis NOT DETECTED NOT DETECTED   Pseudomonas aeruginosa NOT DETECTED NOT DETECTED   Stenotrophomonas maltophilia NOT DETECTED NOT DETECTED   Candida albicans NOT DETECTED NOT DETECTED   Candida auris NOT DETECTED NOT DETECTED   Candida glabrata NOT DETECTED NOT DETECTED   Candida krusei NOT DETECTED NOT DETECTED   Candida parapsilosis NOT DETECTED NOT DETECTED   Candida tropicalis NOT DETECTED NOT DETECTED   Cryptococcus neoformans/gattii NOT DETECTED NOT DETECTED   Meth resistant mecA/C and MREJ NOT DETECTED NOT DETECTED    Comment: Performed at [facility], [address], [place], [place] [address]  CBG monitoring, ED     Status: Abnormal   Collection Time: 04/04/2020  6:07 PM  Result Value Ref Range   Glucose-Capillary 313 (H) 70 - 99 mg/dL    Comment: Glucose reference range applies only to samples taken after fasting for at least 8 hours.  CBG monitoring, ED     Status: Abnormal   Collection Time: 04/01/2020  8:18 PM  Result Value Ref Range   Glucose-Capillary 232 (H) 70 - 99 mg/dL    Comment: Glucose reference range applies only to samples taken after fasting for at least 8 hours.  CBG monitoring, ED     Status: Abnormal   Collection Time:  04/01/2020  9:30 PM  Result Value Ref Range   Glucose-Capillary 170 (H) 70 - 99 mg/dL    Comment: Glucose reference range applies only to samples taken after fasting for at least 8 hours.  CBG monitoring, ED     Status: Abnormal   Collection Time: 03/25/2020 11:28 PM  Result Value Ref Range   Glucose-Capillary 232 (H) 70 - 99 mg/dL    Comment: Glucose reference range applies only to samples taken after fasting for at least 8 hours.  Urinalysis, Routine w reflex microscopic Urine, Catheterized     Status: Abnormal   Collection Time: 03/27/2020 11:48 PM  Result Value Ref Range   Color, Urine YELLOW YELLOW   APPearance HAZY (A) CLEAR   Specific Gravity, Urine 1.020 1.005 - 1.030   pH 5.0 5.0 - 8.0   Glucose, UA >=500 (A) NEGATIVE mg/dL   Hgb urine dipstick SMALL (A) NEGATIVE   Bilirubin Urine NEGATIVE NEGATIVE   Ketones, ur 20 (A) NEGATIVE mg/dL   Protein, ur NEGATIVE NEGATIVE mg/dL   Nitrite NEGATIVE NEGATIVE   Leukocytes,Ua SMALL (A) NEGATIVE   RBC / HPF 6-10 0 - 5 RBC/hpf   WBC, UA 11-20 0 -  5 WBC/hpf   Bacteria, UA RARE (A) NONE SEEN   Squamous Epithelial / LPF 0-5 0 - 5    Comment: Performed at [facility], [place] [address] [place], [place] [address]  CBG monitoring, ED     Status: Abnormal   Collection Time: 03/22/20  1:00 AM  Result Value Ref Range   Glucose-Capillary 229 (H) 70 - 99 mg/dL    Comment: Glucose reference range applies only to samples taken after fasting for at least 8 hours.  CBG monitoring, ED     Status: Abnormal   Collection Time: 03/22/20  3:06 AM  Result Value Ref Range   Glucose-Capillary 104 (H) 70 - 99 mg/dL    Comment: Glucose reference range applies only to samples taken after fasting for at least 8 hours.  CBG monitoring, ED     Status: Abnormal   Collection Time: 03/22/20  4:37 AM  Result Value Ref Range   Glucose-Capillary 238 (H) 70 - 99 mg/dL    Comment: Glucose reference range applies only to samples taken after fasting for at  least 8 hours.  CBG monitoring, ED     Status: Abnormal   Collection Time: 03/22/20  7:07 AM  Result Value Ref Range   Glucose-Capillary 285 (H) 70 - 99 mg/dL    Comment: Glucose reference range applies only to samples taken after fasting for at least 8 hours.  CBG monitoring, ED     Status: Abnormal   Collection Time: 03/22/20  9:12 AM  Result Value Ref Range   Glucose-Capillary 260 (H) 70 - 99 mg/dL    Comment: Glucose reference range applies only to samples taken after fasting for at least 8 hours.  CBC     Status: Abnormal   Collection Time: 03/22/20  9:24 AM  Result Value Ref Range   WBC 18.9 (H) 4.0 - 10.5 K/uL   RBC 3.19 (L) 3.87 - 5.11 MIL/uL   Hemoglobin 8.9 (L) 12.0 - 15.0 g/dL   HCT 27.6 (L) 36.0 - 46.0 %   MCV 86.5 80.0 - 100.0 fL   MCH 27.9 26.0 - 34.0 pg   MCHC 32.2 30.0 - 36.0 g/dL   RDW 15.1 11.5 - 15.5 %   Platelets 457 (H) 150 - 400 K/uL   nRBC 0.0 0.0 - 0.2 %    Comment: Performed at [facility], [place] [address], [place], [place] [address]  Beta-hydroxybutyric acid     Status: Abnormal   Collection Time: 03/22/20  9:24 AM  Result Value Ref Range   Beta-Hydroxybutyric Acid 0.37 (H) 0.05 - 0.27 mmol/L    Comment: Performed at [facility], [place] [address], [place],  [address]  Basic metabolic panel     Status: Abnormal   Collection Time: 03/22/20  9:24 AM  Result Value Ref Range   Sodium 136 135 - 145 mmol/L   Potassium 4.0 3.5 - 5.1 mmol/L    Comment: SLIGHT HEMOLYSIS   Chloride 106 98 - 111 mmol/L   CO2 21 (L) 22 - 32 mmol/L   Glucose, Bld 334 (H) 70 - 99 mg/dL    Comment: Glucose reference range applies only to samples taken after fasting for at least 8 hours.   BUN 59 (H) 8 - 23 mg/dL   Creatinine, Ser 1.65 (H) 0.44 - 1.00 mg/dL   Calcium >15.0 (HH) 8.9 - 10.3 mg/dL    Comment: CRITICAL RESULT CALLED TO, READ BACK BY AND VERIFIED WITH: K LANGLEY  AT 1004 ON 03/22/2020 BY JPM    GFR, Estimated 34 (L) >60 mL/min     Comment: (NOTE) Calculated using the CKD-EPI Creatinine Equation (2021)    Anion gap 9 5 - 15    Comment: Performed at [facility], [place] [address] [place], [place] [address]  CBG monitoring, ED     Status: Abnormal   Collection Time: 03/22/20 10:15 AM  Result Value Ref Range   Glucose-Capillary 281 (H) 70 - 99 mg/dL    Comment: Glucose reference range applies only to samples taken after fasting for at least 8 hours.  CBG monitoring, ED     Status: Abnormal   Collection Time: 03/22/20 11:37 AM  Result Value Ref Range   Glucose-Capillary 264 (H) 70 - 99 mg/dL    Comment: Glucose reference range applies only to samples taken after fasting for at least 8 hours.  CBG monitoring, ED     Status: Abnormal   Collection Time: 03/22/20  1:20 PM  Result Value Ref Range   Glucose-Capillary 209 (H) 70 - 99 mg/dL    Comment: Glucose reference range applies only to samples taken after fasting for at least 8 hours.  Basic metabolic panel     Status: Abnormal   Collection Time: 03/22/20  2:16 PM  Result Value Ref Range   Sodium 140 135 - 145 mmol/L   Potassium 3.4 (L) 3.5 - 5.1 mmol/L   Chloride 109 98 - 111 mmol/L   CO2 22 22 - 32 mmol/L   Glucose, Bld 118 (H) 70 - 99 mg/dL    Comment: Glucose reference range applies only to samples taken after fasting for at least 8 hours.   BUN 53 (H) 8 - 23 mg/dL   Creatinine, Ser 1.54 (H) 0.44 - 1.00 mg/dL   Calcium >15.0 (HH) 8.9 - 10.3 mg/dL    Comment: CRITICAL RESULT CALLED TO, READ BACK BY AND VERIFIED WITH: CLAPP,S RN @1517  ON 03/22/20 JACKSON,K    GFR, Estimated 37 (L) >60 mL/min    Comment: (NOTE) Calculated using the CKD-EPI Creatinine Equation (2021)    Anion gap 9 5 - 15    Comment: Performed at [facility], [place] [address], [place], [place] [address]  Lactic acid, plasma     Status: Abnormal   Collection Time: 03/22/20  2:30 PM  Result Value Ref Range   Lactic Acid, Venous 2.9 (HH) 0.5 - 1.9 mmol/L     Comment: CRITICAL RESULT CALLED TO, READ BACK BY AND VERIFIED WITH: CLAPP,S RN @1517  ON 03/22/20 JACKSON,K Performed at [facility], [place] [address], [place],  [address]   CBG monitoring, ED     Status: Abnormal   Collection Time: 03/22/20  2:51 PM  Result Value Ref Range   Glucose-Capillary 101 (H) 70 - 99 mg/dL    Comment: Glucose reference range applies only to samples taken after fasting for at least 8 hours.  Basic metabolic panel     Status: Abnormal   Collection Time: 03/22/20  6:25 PM  Result Value Ref Range   Sodium 139 135 - 145 mmol/L   Potassium 4.9 3.5 - 5.1 mmol/L    Comment: DELTA CHECK NOTED   Chloride 108 98 - 111 mmol/L   CO2 21 (L) 22 - 32 mmol/L   Glucose, Bld 186 (H) 70 - 99 mg/dL    Comment: Glucose reference range applies only to samples taken after fasting for at least 8 hours.   BUN  52 (H) 8 - 23 mg/dL   Creatinine, Ser 1.67 (H) 0.44 - 1.00 mg/dL   Calcium 13.9 (HH) 8.9 - 10.3 mg/dL    Comment: CRITICAL RESULT CALLED TO, READ BACK BY AND VERIFIED WITH: REBECCA GROVES RN 03/22/2020 @1901  BY P.HENDERSON    GFR, Estimated 34 (L) >60 mL/min    Comment: (NOTE) Calculated using the CKD-EPI Creatinine Equation (2021)    Anion gap 10 5 - 15    Comment: Performed at [facility], [place] [address], [place], [place] [address]  Lactic acid, plasma     Status: None   Collection Time: 03/22/20  6:25 PM  Result Value Ref Range   Lactic Acid, Venous 1.6 0.5 - 1.9 mmol/L    Comment: Performed at [facility], [place] [address] [place], [place] [address]  CBG monitoring, ED     Status: Abnormal   Collection Time: 03/22/20  6:35 PM  Result Value Ref Range   Glucose-Capillary 169 (H) 70 - 99 mg/dL    Comment: Glucose reference range applies only to samples taken after fasting for at least 8 hours.  CBG monitoring, ED     Status: Abnormal   Collection Time: 03/22/20  7:35 PM  Result Value Ref Range    Glucose-Capillary 230 (H) 70 - 99 mg/dL    Comment: Glucose reference range applies only to samples taken after fasting for at least 8 hours.  CBG monitoring, ED     Status: Abnormal   Collection Time: 03/22/20  7:59 PM  Result Value Ref Range   Glucose-Capillary 258 (H) 70 - 99 mg/dL    Comment: Glucose reference range applies only to samples taken after fasting for at least 8 hours.   Comment 1 Notify RN     MR BRAIN WO CONTRAST  Result Date: 03/22/2020 CLINICAL DATA:  Mental status change, multiple falls, right arm numbness EXAM: MRI HEAD WITHOUT CONTRAST TECHNIQUE: Multiplanar, multiecho pulse sequences of the brain and surrounding structures were obtained without intravenous contrast. COMPARISON:  None. FINDINGS: Brain: There is no acute infarction or intracranial hemorrhage. There is no intracranial mass, mass effect, or edema. There is no hydrocephalus or extra-axial fluid collection. Multiple small foci of susceptibility hypointensity with involvement of the basal ganglia, thalamus, and pons consistent with chronic hypertensive microhemorrhages. Few scattered small foci of T2 hyperintensity in the supratentorial white matter are nonspecific but may reflect minor chronic microvascular ischemic changes. Ventricles and sulci are within normal limits in size and configuration. Vascular: Major vessel flow voids at the skull base are preserved. Skull and upper cervical spine: Marrow signal within normal limits. Sinuses/Orbits: Paranasal sinuses are aerated. Orbits are unremarkable. Other: Right frontal scalp hematoma. Sella is unremarkable. Mastoid air cells are clear. IMPRESSION: No evidence of recent infarction, hemorrhage, or mass. Chronic hypertensive microhemorrhages. Minor chronic microvascular ischemic changes. Electronically Signed   By: Macy Mis M.D.   On: 03/22/2020 16:36   US RENAL  Result Date: 03/17/2020 CLINICAL DATA:  Acute renal insufficiency. EXAM: RENAL / URINARY TRACT  ULTRASOUND COMPLETE COMPARISON:  None. FINDINGS: Right Kidney: Renal measurements: 10.6 x 4.4 x 4.8 cm = volume: 117.1 mL. No significant hydronephrosis. Left Kidney: Renal measurements: 10.7 x 5.2 x 4.7 cm = volume: 137 mL. Contains a 1.4 cm cyst. Bladder: Appears normal for degree of bladder distention. Other: Cholelithiasis. IMPRESSION: 1. No cause for acute renal insufficiency. 2. Cholelithiasis. Electronically Signed   By: Dorise Bullion III M.D   On: 03/30/2020 19:00  MR ELBOW LEFT WO CONTRAST  Result Date: 03/22/2020 CLINICAL DATA:  Left elbow pain.  MSSA bacteremia. EXAM: MRI OF THE LEFT ELBOW WITHOUT CONTRAST TECHNIQUE: Multiplanar, multisequence MR imaging of the elbow was performed. No intravenous contrast was administered. COMPARISON:  Left elbow x-rays dated March 19, 2020. FINDINGS: Incomplete study. The patient refused further imaging. Sagittal sequence is nondiagnostic. No coronal sequences were obtained. TENDONS Common forearm flexor origin: Intact. Common forearm extensor origin: Intact. Biceps: Intact. Triceps: High-grade partial tear at the insertion, incompletely evaluated. LIGAMENTS Medial stabilizers: Limited evaluation.  Grossly intact. Lateral stabilizers:  Limited evaluation.  Grossly intact. Cartilage: Not well evaluated by the axial sequences. Joint: Moderate joint effusion. Cubital tunnel: Increased T2 signal of the ulnar nerve within the cubital tunnel, likely reactive. Bones: Patchy marrow edema and heterogeneously decreased T1 marrow signal involving the olecranon and proximal ulna. Other: Proximal forearm flexor compartment intramuscular fluid collection measuring 1.6 x 4.3 cm (series 9, images 1-10), with surrounding muscle edema. Additional incompletely visualized ill-defined fluid collection in the ulnar aspect of the distal upper arm medial to the brachialis muscle measuring 3.1 x 2.0 cm (series 9, image 30). Incompletely visualized 1.0 x 1.8 cm fluid collection in the  distal upper arm anterior to the biceps myotendinous junction (series 9, image 30). IMPRESSION: 1. Limited, incomplete study. The patient refused further imaging. 2. 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 3. Moderate joint effusion concerning for septic arthritis. 4. Proximal forearm flexor compartment intramuscular fluid collection measuring 1.6 x 4.3 cm with surrounding muscle edema, concerning for abscess. 5. Additional incompletely visualized small fluid collections in the distal upper arm medial to the brachialis muscle and anterior to the biceps myotendinous junction, also concerning for abscesses. 6. High-grade partial tear of the distal triceps tendon, incompletely evaluated. Electronically Signed   By: Titus Dubin M.D.   On: 03/22/2020 17:40   DG CHEST PORT 1 VIEW  Result Date: 03/17/2020 CLINICAL DATA:  Leukocytosis EXAM: PORTABLE CHEST 1 VIEW COMPARISON:  February 15, 2019 FINDINGS: The cardiomediastinal silhouette is unchanged in contour.Atherosclerotic calcifications of the aorta. Low lung volumes with bronchovascular crowding. No pleural effusion. No pneumothorax. No acute pleuroparenchymal abnormality. Visualized abdomen is unremarkable. Mild degenerative changes of the thoracic spine. IMPRESSION: No acute cardiopulmonary abnormality. Electronically Signed   By: Valentino Saxon MD   On: 04/01/2020 17:09    Review of Systems  Unable to perform ROS: Mental status change   Blood pressure 131/73, pulse (!) 112, temperature 98.6 F (37 C), temperature source Oral, resp. rate 19, SpO2 95 %. Physical Exam Vitals reviewed.  Constitutional:      Appearance: She is ill-appearing.  HENT:     Head:     Comments: She has multiple bruises and scratches on her face Pulmonary:     Effort: Pulmonary effort is normal.  Musculoskeletal:     Comments: She can move her right shoulder through a full range of motion without apparent discomfort. Left elbow is examined and noted to have  mild posterior olecranon swelling with mild palpable warmth.  No significant erythema. She can move the elbow actively and passively through a full range of motion without any apparent discomfort She does have some fullness and warmth about the arm and proximal forearm with mild tenderness. She tells me that the discomfort in her elbow seems to be getting better.  Psychiatric:        Attention and Perception: She is inattentive.        Mood and  Affect: Mood is anxious.     Assessment/Plan: I'm not clinically concerned about a septic joint in the elbow. 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  These are incompletely visualized on the present MRI scan. The patient feels that she would be able to tolerate an MRI better tomorrow morning.  I also spoke with the hospitalist who feels some of her agitation and confusion may be related to her abnormal calcium which will hopefully be corrected soon. I currently have her on the schedule for surgery tomorrow at 1 PM and I'm hopeful that we can get a better MRI to adequately determine the location and extent of any abscesses that would need to be addressed. NPO p midnight. Abx per ID.  Deborah Figueroa 03/22/2020, 8:04 PM

## 2020-03-22 NOTE — ED Notes (Addendum)
Pt son, Trula Frede [phone], called for an update on pt care. I asked pt if I could speak with her son regarding her medical care and she said yes. I gave him an update. He wants to know where she will stay after her discharged.

## 2020-03-22 NOTE — H&P (View-Only) (Signed)
Reason for Consult:Concern of left elbow infection Referring Physician: Argelia Formisano is an 65 y.o. female.  HPI: 65 year old diabetic female who was reportedly living at a hotel.  She has had multiple recent falls and poorly controlled blood sugars.  She presented to the emergency department and was found to be septic with OSSA In blood cultures.  Concern of pain in the left elbow led to an MRI which she was only able to tolerate for a short period.  The MRI was of poor quality but showed concern for possible osteomyelitis of the tip of the olecranon, joint effusion and partially visualized areas of likely abscess in both the proximal forearm and distal upper arm.  Past Medical History:  Diagnosis Date  . Cellulitis   . Depression   . Hyperlipidemia   . Hypertension   . Seasonal allergies     Past Surgical History:  Procedure Laterality Date  . BACK SURGERY    . BREAST SURGERY     lumpectomy left breast  . LUMBAR FUSION      Family History  Problem Relation Age of Onset  . Stroke Mother   . Hypertension Mother   . Hyperlipidemia Mother   . Cancer Father 6       liver and colon  . Depression Father   . Cancer Maternal Aunt        breast  . Diabetes Brother   . Cancer Maternal Grandmother        breast    Social History:  reports that she has never smoked. She has never used smokeless tobacco. She reports that she does not drink alcohol and does not use drugs.  Allergies: No Known Allergies  Medications: I have reviewed the patient's current medications.  Results for orders placed or performed during the hospital encounter of 03/20/2020 (from the past 48 hour(s))  Beta-hydroxybutyric acid     Status: None   Collection Time: 04/01/2020  1:30 AM  Result Value Ref Range   Beta-Hydroxybutyric Acid 0.20 0.05 - 0.27 mmol/L    Comment: Performed at [facility], [place] [address] [place], [place] [address]  HIV Antibody (routine testing w rflx)      Status: None   Collection Time: 03/31/2020  1:30 AM  Result Value Ref Range   HIV Screen 4th Generation wRfx Non Reactive Non Reactive    Comment: Performed at [facility], [place] [address], [place], [place] [address]  T4, free     Status: None   Collection Time: 03/22/2020  1:30 AM  Result Value Ref Range   Free T4 0.83 0.61 - 1.12 ng/dL    Comment: (NOTE) Biotin ingestion may interfere with free T4 tests. If the results are inconsistent with the TSH level, previous test results, or the clinical presentation, then consider biotin interference. If needed, order repeat testing after stopping biotin. Performed at [facility], [place] [address] [place],  [address]   VITAMIN D 25 Hydroxy (Vit-D Deficiency, Fractures)     Status: Abnormal   Collection Time: 03/20/2020  1:38 AM  Result Value Ref Range   Vit D, 25-Hydroxy 25.93 (L) 30 - 100 ng/mL    Comment: (NOTE) Vitamin D deficiency has been defined by the Institute of Medicine  and an Endocrine Society practice guideline as a level of serum 25-OH  vitamin D less than 20 ng/mL (1,2). The Endocrine Society went on to  further define vitamin D insufficiency as a level  between 21 and 29  ng/mL (2).  1. IOM (Institute of Medicine). 2010. Dietary reference intakes for  calcium and D. Trumann: The [employer]. 2. Holick MF, [place], Bischoff-Ferrari HA, et al. Evaluation,  treatment, and prevention of vitamin D deficiency: an Endocrine  Society clinical practice guideline, JCEM. 2011 Jul; 96(7): 1911-30.  Performed at [facility], [place] [address] [place], [place] [address]   CBG monitoring, ED     Status: Abnormal   Collection Time: 04/07/2020 12:45 PM  Result Value Ref Range   Glucose-Capillary 537 (HH) 70 - 99 mg/dL    Comment: Glucose reference range applies only to samples taken after fasting for at least 8 hours.   Comment 1 Notify RN   Basic metabolic panel     Status: Abnormal    Collection Time: 03/27/2020 12:47 PM  Result Value Ref Range   Sodium 132 (L) 135 - 145 mmol/L   Potassium 3.8 3.5 - 5.1 mmol/L   Chloride 100 98 - 111 mmol/L   CO2 13 (L) 22 - 32 mmol/L   Glucose, Bld 650 (HH) 70 - 99 mg/dL    Comment: Glucose reference range applies only to samples taken after fasting for at least 8 hours. CRITICAL RESULT CALLED TO, READ BACK BY AND VERIFIED WITH: GREEN,G.MD AT 1349 03/22/2020 MULLINS,T    BUN 73 (H) 8 - 23 mg/dL   Creatinine, Ser 2.23 (H) 0.44 - 1.00 mg/dL   Calcium 13.3 (HH) 8.9 - 10.3 mg/dL    Comment: CRITICAL RESULT CALLED TO, READ BACK BY AND VERIFIED WITH: GREEN,G.MD AT 1349 03/14/2020 MULLINS,T    GFR, Estimated 24 (L) >60 mL/min    Comment: (NOTE) Calculated using the CKD-EPI Creatinine Equation (2021)    Anion gap 19 (H) 5 - 15    Comment: Performed at [facility], [place] [address] [place], [place] [address]  CBC     Status: Abnormal   Collection Time: 03/23/2020 12:47 PM  Result Value Ref Range   WBC 18.0 (H) 4.0 - 10.5 K/uL   RBC 2.69 (L) 3.87 - 5.11 MIL/uL   Hemoglobin 7.7 (L) 12.0 - 15.0 g/dL   HCT 24.7 (L) 36.0 - 46.0 %   MCV 91.8 80.0 - 100.0 fL   MCH 28.6 26.0 - 34.0 pg   MCHC 31.2 30.0 - 36.0 g/dL   RDW 15.6 (H) 11.5 - 15.5 %   Platelets 469 (H) 150 - 400 K/uL   nRBC 0.0 0.0 - 0.2 %    Comment: Performed at [facility], [place] [address], [place], [place] [address]  Beta-hydroxybutyric acid     Status: Abnormal   Collection Time: 04/02/2020  2:13 PM  Result Value Ref Range   Beta-Hydroxybutyric Acid 6.21 (H) 0.05 - 0.27 mmol/L    Comment: RESULTS CONFIRMED BY MANUAL DILUTION Performed at [place] [address] [place], [place] [address]   Blood gas, venous     Status: Abnormal   Collection Time: 03/23/2020  2:13 PM  Result Value Ref Range   FIO2 21.00    pH, Ven 7.233 (L) 7.250 - 7.430   pCO2, Ven 35.7 (L) 44.0 - 60.0 mmHg   pO2, Ven 64.8 (H) 32.0 - 45.0 mmHg    Bicarbonate 14.6 (L) 20.0 - 28.0 mmol/L   Acid-base deficit 11.6 (H) 0.0 - 2.0 mmol/L   O2 Saturation 86.3 %   Patient temperature 98.6     Comment: Performed at  [facility], [place] [address] [place], [place] [address]  SARS CORONAVIRUS 2 (TAT 6-24 HRS) Nasopharyngeal Nasopharyngeal Swab     Status: None   Collection Time: 04/09/2020  2:13 PM   Specimen: Nasopharyngeal Swab  Result Value Ref Range   SARS Coronavirus 2 NEGATIVE NEGATIVE    Comment: (NOTE) SARS-CoV-2 target nucleic acids are NOT DETECTED.  The SARS-CoV-2 RNA is generally detectable in upper and lower respiratory specimens during the acute phase of infection. Negative results do not preclude SARS-CoV-2 infection, do not rule out co-infections with other pathogens, and should not be used as the sole basis for treatment or other patient management decisions. Negative results must be combined with clinical observations, patient history, and epidemiological information. The expected result is Negative.  Fact Sheet for Patients: SugarRoll.be  Fact Sheet for Healthcare Providers: https://www.woods-mathews.com/  This test is not yet approved or cleared by the [place] FDA and  has been authorized for detection and/or diagnosis of SARS-CoV-2 by FDA under an Emergency Use Authorization (EUA). This EUA will remain  in effect (meaning this test can be used) for the duration of the COVID-19 declaration under Se ction 564(b)(1) of the Act, 21 U.S.C. section 360bbb-3(b)(1), unless the authorization is terminated or revoked sooner.  Performed at [facility], [place] [address], [place], [place] [address]   CBG monitoring, ED     Status: Abnormal   Collection Time: 04/11/2020  3:20 PM  Result Value Ref Range   Glucose-Capillary 518 (HH) 70 - 99 mg/dL    Comment: Glucose reference range applies only to samples taken after fasting for at least 8 hours.   Comment 1  Notify RN    Comment 2 Document in Chart   CBG monitoring, ED     Status: Abnormal   Collection Time: 03/27/2020  4:57 PM  Result Value Ref Range   Glucose-Capillary 472 (H) 70 - 99 mg/dL    Comment: Glucose reference range applies only to samples taken after fasting for at least 8 hours.  Culture, blood (routine x 2)     Status: None (Preliminary result)   Collection Time: 03/17/2020  5:08 PM   Specimen: BLOOD  Result Value Ref Range   Specimen Description      BLOOD UNKNOWN Performed at [place] [address], [place], [place] [address]    Special Requests      BOTTLES DRAWN AEROBIC AND ANAEROBIC Blood Culture adequate volume Performed at [place] [address], [place], [place] [address]    Culture  Setup Time      GRAM POSITIVE COCCI IN BOTH AEROBIC AND ANAEROBIC BOTTLES Organism ID to follow CRITICAL RESULT CALLED TO, READ BACK BY AND VERIFIED WITH: PHARMD C.PIERCE AT 1200 ON 03/22/2020 BY T.SAAD    Culture      NO GROWTH < 24 HOURS Performed at [facility], [place] [address], [place], [place] [address]    Report Status PENDING   Blood Culture ID Panel (Reflexed)     Status: Abnormal   Collection Time: 04/09/2020  5:08 PM  Result Value Ref Range   Enterococcus faecalis NOT DETECTED NOT DETECTED   Enterococcus Faecium NOT DETECTED NOT DETECTED   Listeria monocytogenes NOT DETECTED NOT DETECTED   Staphylococcus species DETECTED (A) NOT DETECTED    Comment: CRITICAL RESULT CALLED TO, READ BACK BY AND VERIFIED WITH: PHARMD C.PIERCE AT 1200 ON 03/22/2020 BY T.SAAD    Staphylococcus aureus (BCID) DETECTED (  A) NOT DETECTED    Comment: CRITICAL RESULT CALLED TO, READ BACK BY AND VERIFIED WITH: PHARMD C.PIERCE AT 1200 ON 03/22/2020 BY T.SAAD    Staphylococcus epidermidis NOT DETECTED NOT DETECTED   Staphylococcus lugdunensis NOT DETECTED NOT DETECTED   Streptococcus species NOT DETECTED NOT DETECTED   Streptococcus agalactiae  NOT DETECTED NOT DETECTED   Streptococcus pneumoniae NOT DETECTED NOT DETECTED   Streptococcus pyogenes NOT DETECTED NOT DETECTED   A.calcoaceticus-baumannii NOT DETECTED NOT DETECTED   Bacteroides fragilis NOT DETECTED NOT DETECTED   Enterobacterales NOT DETECTED NOT DETECTED   Enterobacter cloacae complex NOT DETECTED NOT DETECTED   Escherichia coli NOT DETECTED NOT DETECTED   Klebsiella aerogenes NOT DETECTED NOT DETECTED   Klebsiella oxytoca NOT DETECTED NOT DETECTED   Klebsiella pneumoniae NOT DETECTED NOT DETECTED   Proteus species NOT DETECTED NOT DETECTED   Salmonella species NOT DETECTED NOT DETECTED   Serratia marcescens NOT DETECTED NOT DETECTED   Haemophilus influenzae NOT DETECTED NOT DETECTED   Neisseria meningitidis NOT DETECTED NOT DETECTED   Pseudomonas aeruginosa NOT DETECTED NOT DETECTED   Stenotrophomonas maltophilia NOT DETECTED NOT DETECTED   Candida albicans NOT DETECTED NOT DETECTED   Candida auris NOT DETECTED NOT DETECTED   Candida glabrata NOT DETECTED NOT DETECTED   Candida krusei NOT DETECTED NOT DETECTED   Candida parapsilosis NOT DETECTED NOT DETECTED   Candida tropicalis NOT DETECTED NOT DETECTED   Cryptococcus neoformans/gattii NOT DETECTED NOT DETECTED   Meth resistant mecA/C and MREJ NOT DETECTED NOT DETECTED    Comment: Performed at [facility], [address], [place], [place] [address]  CBG monitoring, ED     Status: Abnormal   Collection Time: 03/27/2020  6:07 PM  Result Value Ref Range   Glucose-Capillary 313 (H) 70 - 99 mg/dL    Comment: Glucose reference range applies only to samples taken after fasting for at least 8 hours.  CBG monitoring, ED     Status: Abnormal   Collection Time: 03/19/2020  8:18 PM  Result Value Ref Range   Glucose-Capillary 232 (H) 70 - 99 mg/dL    Comment: Glucose reference range applies only to samples taken after fasting for at least 8 hours.  CBG monitoring, ED     Status: Abnormal   Collection Time:  03/26/2020  9:30 PM  Result Value Ref Range   Glucose-Capillary 170 (H) 70 - 99 mg/dL    Comment: Glucose reference range applies only to samples taken after fasting for at least 8 hours.  CBG monitoring, ED     Status: Abnormal   Collection Time: 03/26/2020 11:28 PM  Result Value Ref Range   Glucose-Capillary 232 (H) 70 - 99 mg/dL    Comment: Glucose reference range applies only to samples taken after fasting for at least 8 hours.  Urinalysis, Routine w reflex microscopic Urine, Catheterized     Status: Abnormal   Collection Time: 04/01/2020 11:48 PM  Result Value Ref Range   Color, Urine YELLOW YELLOW   APPearance HAZY (A) CLEAR   Specific Gravity, Urine 1.020 1.005 - 1.030   pH 5.0 5.0 - 8.0   Glucose, UA >=500 (A) NEGATIVE mg/dL   Hgb urine dipstick SMALL (A) NEGATIVE   Bilirubin Urine NEGATIVE NEGATIVE   Ketones, ur 20 (A) NEGATIVE mg/dL   Protein, ur NEGATIVE NEGATIVE mg/dL   Nitrite NEGATIVE NEGATIVE   Leukocytes,Ua SMALL (A) NEGATIVE   RBC / HPF 6-10 0 - 5 RBC/hpf   WBC, UA 11-20 0 -  5 WBC/hpf   Bacteria, UA RARE (A) NONE SEEN   Squamous Epithelial / LPF 0-5 0 - 5    Comment: Performed at [facility], [place] [address], [place], [place] [address]  CBG monitoring, ED     Status: Abnormal   Collection Time: 03/22/20  1:00 AM  Result Value Ref Range   Glucose-Capillary 229 (H) 70 - 99 mg/dL    Comment: Glucose reference range applies only to samples taken after fasting for at least 8 hours.  CBG monitoring, ED     Status: Abnormal   Collection Time: 03/22/20  3:06 AM  Result Value Ref Range   Glucose-Capillary 104 (H) 70 - 99 mg/dL    Comment: Glucose reference range applies only to samples taken after fasting for at least 8 hours.  CBG monitoring, ED     Status: Abnormal   Collection Time: 03/22/20  4:37 AM  Result Value Ref Range   Glucose-Capillary 238 (H) 70 - 99 mg/dL    Comment: Glucose reference range applies only to samples taken after fasting for at  least 8 hours.  CBG monitoring, ED     Status: Abnormal   Collection Time: 03/22/20  7:07 AM  Result Value Ref Range   Glucose-Capillary 285 (H) 70 - 99 mg/dL    Comment: Glucose reference range applies only to samples taken after fasting for at least 8 hours.  CBG monitoring, ED     Status: Abnormal   Collection Time: 03/22/20  9:12 AM  Result Value Ref Range   Glucose-Capillary 260 (H) 70 - 99 mg/dL    Comment: Glucose reference range applies only to samples taken after fasting for at least 8 hours.  CBC     Status: Abnormal   Collection Time: 03/22/20  9:24 AM  Result Value Ref Range   WBC 18.9 (H) 4.0 - 10.5 K/uL   RBC 3.19 (L) 3.87 - 5.11 MIL/uL   Hemoglobin 8.9 (L) 12.0 - 15.0 g/dL   HCT 27.6 (L) 36.0 - 46.0 %   MCV 86.5 80.0 - 100.0 fL   MCH 27.9 26.0 - 34.0 pg   MCHC 32.2 30.0 - 36.0 g/dL   RDW 15.1 11.5 - 15.5 %   Platelets 457 (H) 150 - 400 K/uL   nRBC 0.0 0.0 - 0.2 %    Comment: Performed at [facility], [place] [address], [place], [place] [address]  Beta-hydroxybutyric acid     Status: Abnormal   Collection Time: 03/22/20  9:24 AM  Result Value Ref Range   Beta-Hydroxybutyric Acid 0.37 (H) 0.05 - 0.27 mmol/L    Comment: Performed at [facility], [place] [address] [place], [place] [address]  Basic metabolic panel     Status: Abnormal   Collection Time: 03/22/20  9:24 AM  Result Value Ref Range   Sodium 136 135 - 145 mmol/L   Potassium 4.0 3.5 - 5.1 mmol/L    Comment: SLIGHT HEMOLYSIS   Chloride 106 98 - 111 mmol/L   CO2 21 (L) 22 - 32 mmol/L   Glucose, Bld 334 (H) 70 - 99 mg/dL    Comment: Glucose reference range applies only to samples taken after fasting for at least 8 hours.   BUN 59 (H) 8 - 23 mg/dL   Creatinine, Ser 1.65 (H) 0.44 - 1.00 mg/dL   Calcium >15.0 (HH) 8.9 - 10.3 mg/dL    Comment: CRITICAL RESULT CALLED TO, READ BACK BY AND VERIFIED WITH: K LANGLEY  AT 1004 ON 03/22/2020 BY JPM    GFR, Estimated 34 (L) >60 mL/min     Comment: (NOTE) Calculated using the CKD-EPI Creatinine Equation (2021)    Anion gap 9 5 - 15    Comment: Performed at [facility], [place] [address], [place], [place] [address]  CBG monitoring, ED     Status: Abnormal   Collection Time: 03/22/20 10:15 AM  Result Value Ref Range   Glucose-Capillary 281 (H) 70 - 99 mg/dL    Comment: Glucose reference range applies only to samples taken after fasting for at least 8 hours.  CBG monitoring, ED     Status: Abnormal   Collection Time: 03/22/20 11:37 AM  Result Value Ref Range   Glucose-Capillary 264 (H) 70 - 99 mg/dL    Comment: Glucose reference range applies only to samples taken after fasting for at least 8 hours.  CBG monitoring, ED     Status: Abnormal   Collection Time: 03/22/20  1:20 PM  Result Value Ref Range   Glucose-Capillary 209 (H) 70 - 99 mg/dL    Comment: Glucose reference range applies only to samples taken after fasting for at least 8 hours.  Basic metabolic panel     Status: Abnormal   Collection Time: 03/22/20  2:16 PM  Result Value Ref Range   Sodium 140 135 - 145 mmol/L   Potassium 3.4 (L) 3.5 - 5.1 mmol/L   Chloride 109 98 - 111 mmol/L   CO2 22 22 - 32 mmol/L   Glucose, Bld 118 (H) 70 - 99 mg/dL    Comment: Glucose reference range applies only to samples taken after fasting for at least 8 hours.   BUN 53 (H) 8 - 23 mg/dL   Creatinine, Ser 1.54 (H) 0.44 - 1.00 mg/dL   Calcium >15.0 (HH) 8.9 - 10.3 mg/dL    Comment: CRITICAL RESULT CALLED TO, READ BACK BY AND VERIFIED WITH: CLAPP,S RN @1517  ON 03/22/20 JACKSON,K    GFR, Estimated 37 (L) >60 mL/min    Comment: (NOTE) Calculated using the CKD-EPI Creatinine Equation (2021)    Anion gap 9 5 - 15    Comment: Performed at [facility], [place] [address], [place], [place] [address]  Lactic acid, plasma     Status: Abnormal   Collection Time: 03/22/20  2:30 PM  Result Value Ref Range   Lactic Acid, Venous 2.9 (HH) 0.5 - 1.9 mmol/L     Comment: CRITICAL RESULT CALLED TO, READ BACK BY AND VERIFIED WITH: CLAPP,S RN @1517  ON 03/22/20 JACKSON,K Performed at [facility], [place] [address] [place],  [address]   CBG monitoring, ED     Status: Abnormal   Collection Time: 03/22/20  2:51 PM  Result Value Ref Range   Glucose-Capillary 101 (H) 70 - 99 mg/dL    Comment: Glucose reference range applies only to samples taken after fasting for at least 8 hours.  Basic metabolic panel     Status: Abnormal   Collection Time: 03/22/20  6:25 PM  Result Value Ref Range   Sodium 139 135 - 145 mmol/L   Potassium 4.9 3.5 - 5.1 mmol/L    Comment: DELTA CHECK NOTED   Chloride 108 98 - 111 mmol/L   CO2 21 (L) 22 - 32 mmol/L   Glucose, Bld 186 (H) 70 - 99 mg/dL    Comment: Glucose reference range applies only to samples taken after fasting for at least 8 hours.   BUN  52 (H) 8 - 23 mg/dL   Creatinine, Ser 1.67 (H) 0.44 - 1.00 mg/dL   Calcium 13.9 (HH) 8.9 - 10.3 mg/dL    Comment: CRITICAL RESULT CALLED TO, READ BACK BY AND VERIFIED WITH: REBECCA GROVES RN 03/22/2020 @1901  BY P.HENDERSON    GFR, Estimated 34 (L) >60 mL/min    Comment: (NOTE) Calculated using the CKD-EPI Creatinine Equation (2021)    Anion gap 10 5 - 15    Comment: Performed at [facility], [place] [address], [place], [place] [address]  Lactic acid, plasma     Status: None   Collection Time: 03/22/20  6:25 PM  Result Value Ref Range   Lactic Acid, Venous 1.6 0.5 - 1.9 mmol/L    Comment: Performed at [facility], [place] [address] [place], [place] [address]  CBG monitoring, ED     Status: Abnormal   Collection Time: 03/22/20  6:35 PM  Result Value Ref Range   Glucose-Capillary 169 (H) 70 - 99 mg/dL    Comment: Glucose reference range applies only to samples taken after fasting for at least 8 hours.  CBG monitoring, ED     Status: Abnormal   Collection Time: 03/22/20  7:35 PM  Result Value Ref Range    Glucose-Capillary 230 (H) 70 - 99 mg/dL    Comment: Glucose reference range applies only to samples taken after fasting for at least 8 hours.  CBG monitoring, ED     Status: Abnormal   Collection Time: 03/22/20  7:59 PM  Result Value Ref Range   Glucose-Capillary 258 (H) 70 - 99 mg/dL    Comment: Glucose reference range applies only to samples taken after fasting for at least 8 hours.   Comment 1 Notify RN     MR BRAIN WO CONTRAST  Result Date: 03/22/2020 CLINICAL DATA:  Mental status change, multiple falls, right arm numbness EXAM: MRI HEAD WITHOUT CONTRAST TECHNIQUE: Multiplanar, multiecho pulse sequences of the brain and surrounding structures were obtained without intravenous contrast. COMPARISON:  None. FINDINGS: Brain: There is no acute infarction or intracranial hemorrhage. There is no intracranial mass, mass effect, or edema. There is no hydrocephalus or extra-axial fluid collection. Multiple small foci of susceptibility hypointensity with involvement of the basal ganglia, thalamus, and pons consistent with chronic hypertensive microhemorrhages. Few scattered small foci of T2 hyperintensity in the supratentorial white matter are nonspecific but may reflect minor chronic microvascular ischemic changes. Ventricles and sulci are within normal limits in size and configuration. Vascular: Major vessel flow voids at the skull base are preserved. Skull and upper cervical spine: Marrow signal within normal limits. Sinuses/Orbits: Paranasal sinuses are aerated. Orbits are unremarkable. Other: Right frontal scalp hematoma. Sella is unremarkable. Mastoid air cells are clear. IMPRESSION: No evidence of recent infarction, hemorrhage, or mass. Chronic hypertensive microhemorrhages. Minor chronic microvascular ischemic changes. Electronically Signed   By: Macy Mis M.D.   On: 03/22/2020 16:36   US RENAL  Result Date: 04/05/2020 CLINICAL DATA:  Acute renal insufficiency. EXAM: RENAL / URINARY TRACT  ULTRASOUND COMPLETE COMPARISON:  None. FINDINGS: Right Kidney: Renal measurements: 10.6 x 4.4 x 4.8 cm = volume: 117.1 mL. No significant hydronephrosis. Left Kidney: Renal measurements: 10.7 x 5.2 x 4.7 cm = volume: 137 mL. Contains a 1.4 cm cyst. Bladder: Appears normal for degree of bladder distention. Other: Cholelithiasis. IMPRESSION: 1. No cause for acute renal insufficiency. 2. Cholelithiasis. Electronically Signed   By: Dorise Bullion III M.D   On: 03/22/2020 19:00  MR ELBOW LEFT WO CONTRAST  Result Date: 03/22/2020 CLINICAL DATA:  Left elbow pain.  MSSA bacteremia. EXAM: MRI OF THE LEFT ELBOW WITHOUT CONTRAST TECHNIQUE: Multiplanar, multisequence MR imaging of the elbow was performed. No intravenous contrast was administered. COMPARISON:  Left elbow x-rays dated March 19, 2020. FINDINGS: Incomplete study. The patient refused further imaging. Sagittal sequence is nondiagnostic. No coronal sequences were obtained. TENDONS Common forearm flexor origin: Intact. Common forearm extensor origin: Intact. Biceps: Intact. Triceps: High-grade partial tear at the insertion, incompletely evaluated. LIGAMENTS Medial stabilizers: Limited evaluation.  Grossly intact. Lateral stabilizers:  Limited evaluation.  Grossly intact. Cartilage: Not well evaluated by the axial sequences. Joint: Moderate joint effusion. Cubital tunnel: Increased T2 signal of the ulnar nerve within the cubital tunnel, likely reactive. Bones: Patchy marrow edema and heterogeneously decreased T1 marrow signal involving the olecranon and proximal ulna. Other: Proximal forearm flexor compartment intramuscular fluid collection measuring 1.6 x 4.3 cm (series 9, images 1-10), with surrounding muscle edema. Additional incompletely visualized ill-defined fluid collection in the ulnar aspect of the distal upper arm medial to the brachialis muscle measuring 3.1 x 2.0 cm (series 9, image 30). Incompletely visualized 1.0 x 1.8 cm fluid collection in the  distal upper arm anterior to the biceps myotendinous junction (series 9, image 30). IMPRESSION: 1. Limited, incomplete study. The patient refused further imaging. 2. 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 3. Moderate joint effusion concerning for septic arthritis. 4. Proximal forearm flexor compartment intramuscular fluid collection measuring 1.6 x 4.3 cm with surrounding muscle edema, concerning for abscess. 5. Additional incompletely visualized small fluid collections in the distal upper arm medial to the brachialis muscle and anterior to the biceps myotendinous junction, also concerning for abscesses. 6. High-grade partial tear of the distal triceps tendon, incompletely evaluated. Electronically Signed   By: Titus Dubin M.D.   On: 03/22/2020 17:40   DG CHEST PORT 1 VIEW  Result Date: 03/26/2020 CLINICAL DATA:  Leukocytosis EXAM: PORTABLE CHEST 1 VIEW COMPARISON:  February 15, 2019 FINDINGS: The cardiomediastinal silhouette is unchanged in contour.Atherosclerotic calcifications of the aorta. Low lung volumes with bronchovascular crowding. No pleural effusion. No pneumothorax. No acute pleuroparenchymal abnormality. Visualized abdomen is unremarkable. Mild degenerative changes of the thoracic spine. IMPRESSION: No acute cardiopulmonary abnormality. Electronically Signed   By: Valentino Saxon MD   On: 04/09/2020 17:09    Review of Systems  Unable to perform ROS: Mental status change   Blood pressure 131/73, pulse (!) 112, temperature 98.6 F (37 C), temperature source Oral, resp. rate 19, SpO2 95 %. Physical Exam Vitals reviewed.  Constitutional:      Appearance: She is ill-appearing.  HENT:     Head:     Comments: She has multiple bruises and scratches on her face Pulmonary:     Effort: Pulmonary effort is normal.  Musculoskeletal:     Comments: She can move her right shoulder through a full range of motion without apparent discomfort. Left elbow is examined and noted to have  mild posterior olecranon swelling with mild palpable warmth.  No significant erythema. She can move the elbow actively and passively through a full range of motion without any apparent discomfort She does have some fullness and warmth about the arm and proximal forearm with mild tenderness. She tells me that the discomfort in her elbow seems to be getting better.  Psychiatric:        Attention and Perception: She is inattentive.        Mood and  Affect: Mood is anxious.     Assessment/Plan: I'm not clinically concerned about a septic joint in the elbow. 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  These are incompletely visualized on the present MRI scan. The patient feels that she would be able to tolerate an MRI better tomorrow morning.  I also spoke with the hospitalist who feels some of her agitation and confusion may be related to her abnormal calcium which will hopefully be corrected soon. I currently have her on the schedule for surgery tomorrow at 1 PM and I'm hopeful that we can get a better MRI to adequately determine the location and extent of any abscesses that would need to be addressed. NPO p midnight. Abx per ID.  [name] 03/22/2020, 8:04 PM

## 2020-03-22 NOTE — ED Notes (Signed)
CBG- [id number]

## 2020-03-22 NOTE — Progress Notes (Signed)
PHARMACY - PHYSICIAN COMMUNICATION CRITICAL VALUE ALERT - BLOOD CULTURE IDENTIFICATION (BCID)  Deborah Figueroa is an 65 y.o. female who presented to [facility] on 04/04/2020 with a chief complaint of weakness  Assessment:  Now with blood cx showing GPC in clusters. BCID detected MSSA.  Name of physician (or Provider) Contacted: Lisbeth Ply  Current antibiotics: None  Changes to prescribed antibiotics recommended:  Start cefazolin 2g IV Q8h  Results for orders placed or performed during the hospital encounter of 03/14/2020  Blood Culture ID Panel (Reflexed) (Collected: 04/04/2020  5:08 PM)  Result Value Ref Range   Enterococcus faecalis NOT DETECTED NOT DETECTED   Enterococcus Faecium NOT DETECTED NOT DETECTED   Listeria monocytogenes NOT DETECTED NOT DETECTED   Staphylococcus species DETECTED (A) NOT DETECTED   Staphylococcus aureus (BCID) DETECTED (A) NOT DETECTED   Staphylococcus epidermidis NOT DETECTED NOT DETECTED   Staphylococcus lugdunensis NOT DETECTED NOT DETECTED   Streptococcus species NOT DETECTED NOT DETECTED   Streptococcus agalactiae NOT DETECTED NOT DETECTED   Streptococcus pneumoniae NOT DETECTED NOT DETECTED   Streptococcus pyogenes NOT DETECTED NOT DETECTED   A.calcoaceticus-baumannii NOT DETECTED NOT DETECTED   Bacteroides fragilis NOT DETECTED NOT DETECTED   Enterobacterales NOT DETECTED NOT DETECTED   Enterobacter cloacae complex NOT DETECTED NOT DETECTED   Escherichia coli NOT DETECTED NOT DETECTED   Klebsiella aerogenes NOT DETECTED NOT DETECTED   Klebsiella oxytoca NOT DETECTED NOT DETECTED   Klebsiella pneumoniae NOT DETECTED NOT DETECTED   Proteus species NOT DETECTED NOT DETECTED   Salmonella species NOT DETECTED NOT DETECTED   Serratia marcescens NOT DETECTED NOT DETECTED   Haemophilus influenzae NOT DETECTED NOT DETECTED   Neisseria meningitidis NOT DETECTED NOT DETECTED   Pseudomonas aeruginosa NOT DETECTED NOT DETECTED   Stenotrophomonas  maltophilia NOT DETECTED NOT DETECTED   Candida albicans NOT DETECTED NOT DETECTED   Candida auris NOT DETECTED NOT DETECTED   Candida glabrata NOT DETECTED NOT DETECTED   Candida krusei NOT DETECTED NOT DETECTED   Candida parapsilosis NOT DETECTED NOT DETECTED   Candida tropicalis NOT DETECTED NOT DETECTED   Cryptococcus neoformans/gattii NOT DETECTED NOT DETECTED   Meth resistant mecA/C and MREJ NOT DETECTED NOT DETECTED    [name] 03/22/2020  12:57 PM

## 2020-03-23 ENCOUNTER — Other Ambulatory Visit: Payer: Self-pay

## 2020-03-23 ENCOUNTER — Encounter (HOSPITAL_COMMUNITY): Payer: Self-pay | Admitting: Internal Medicine

## 2020-03-23 ENCOUNTER — Other Ambulatory Visit (HOSPITAL_COMMUNITY): Payer: Self-pay

## 2020-03-23 ENCOUNTER — Inpatient Hospital Stay (HOSPITAL_COMMUNITY): Payer: Medicaid Other | Admitting: Registered Nurse

## 2020-03-23 ENCOUNTER — Encounter (HOSPITAL_COMMUNITY): Admission: EM | Disposition: E | Payer: Self-pay | Source: Home / Self Care | Attending: Student

## 2020-03-23 ENCOUNTER — Inpatient Hospital Stay (HOSPITAL_COMMUNITY): Payer: Medicaid Other

## 2020-03-23 DIAGNOSIS — M869 Osteomyelitis, unspecified: Secondary | ICD-10-CM

## 2020-03-23 DIAGNOSIS — A419 Sepsis, unspecified organism: Secondary | ICD-10-CM | POA: Diagnosis present

## 2020-03-23 HISTORY — PX: I & D EXTREMITY: SHX5045

## 2020-03-23 LAB — CBC
HCT: 27.3 % — ABNORMAL LOW (ref 36.0–46.0)
Hemoglobin: 8.7 g/dL — ABNORMAL LOW (ref 12.0–15.0)
MCH: 27.7 pg (ref 26.0–34.0)
MCHC: 31.9 g/dL (ref 30.0–36.0)
MCV: 86.9 fL (ref 80.0–100.0)
Platelets: 409 10*3/uL — ABNORMAL HIGH (ref 150–400)
RBC: 3.14 MIL/uL — ABNORMAL LOW (ref 3.87–5.11)
RDW: 15.5 % (ref 11.5–15.5)
WBC: 19.5 10*3/uL — ABNORMAL HIGH (ref 4.0–10.5)
nRBC: 0 % (ref 0.0–0.2)

## 2020-03-23 LAB — SURGICAL PCR SCREEN
MRSA, PCR: NEGATIVE
Staphylococcus aureus: POSITIVE — AB

## 2020-03-23 LAB — COMPREHENSIVE METABOLIC PANEL
ALT: 21 U/L (ref 0–44)
AST: 14 U/L — ABNORMAL LOW (ref 15–41)
Albumin: 2 g/dL — ABNORMAL LOW (ref 3.5–5.0)
Alkaline Phosphatase: 99 U/L (ref 38–126)
Anion gap: 10 (ref 5–15)
BUN: 47 mg/dL — ABNORMAL HIGH (ref 8–23)
CO2: 20 mmol/L — ABNORMAL LOW (ref 22–32)
Calcium: 12.6 mg/dL — ABNORMAL HIGH (ref 8.9–10.3)
Chloride: 110 mmol/L (ref 98–111)
Creatinine, Ser: 1.65 mg/dL — ABNORMAL HIGH (ref 0.44–1.00)
GFR, Estimated: 34 mL/min — ABNORMAL LOW (ref >60)
Glucose, Bld: 408 mg/dL — ABNORMAL HIGH (ref 70–99)
Potassium: 3.6 mmol/L (ref 3.5–5.1)
Sodium: 140 mmol/L (ref 135–145)
Total Bilirubin: 1.7 mg/dL — ABNORMAL HIGH (ref 0.3–1.2)
Total Protein: 6.2 g/dL — ABNORMAL LOW (ref 6.5–8.1)

## 2020-03-23 LAB — CBC WITH DIFFERENTIAL/PLATELET
Abs Immature Granulocytes: 0.31 10*3/uL — ABNORMAL HIGH (ref 0.00–0.07)
Basophils Absolute: 0 10*3/uL (ref 0.0–0.1)
Basophils Relative: 0 %
Eosinophils Absolute: 0 10*3/uL (ref 0.0–0.5)
Eosinophils Relative: 0 %
HCT: 28.7 % — ABNORMAL LOW (ref 36.0–46.0)
Hemoglobin: 9.1 g/dL — ABNORMAL LOW (ref 12.0–15.0)
Immature Granulocytes: 2 %
Lymphocytes Relative: 2 %
Lymphs Abs: 0.3 10*3/uL — ABNORMAL LOW (ref 0.7–4.0)
MCH: 27.4 pg (ref 26.0–34.0)
MCHC: 31.7 g/dL (ref 30.0–36.0)
MCV: 86.4 fL (ref 80.0–100.0)
Monocytes Absolute: 0.3 10*3/uL (ref 0.1–1.0)
Monocytes Relative: 2 %
Neutro Abs: 17.5 10*3/uL — ABNORMAL HIGH (ref 1.7–7.7)
Neutrophils Relative %: 94 %
Platelets: 398 10*3/uL (ref 150–400)
RBC: 3.32 MIL/uL — ABNORMAL LOW (ref 3.87–5.11)
RDW: 15.2 % (ref 11.5–15.5)
WBC: 18.4 10*3/uL — ABNORMAL HIGH (ref 4.0–10.5)
nRBC: 0 % (ref 0.0–0.2)

## 2020-03-23 LAB — URINE CULTURE

## 2020-03-23 LAB — GLUCOSE, CAPILLARY
Glucose-Capillary: 393 mg/dL — ABNORMAL HIGH (ref 70–99)
Glucose-Capillary: 313 mg/dL — ABNORMAL HIGH (ref 70–99)
Glucose-Capillary: 316 mg/dL — ABNORMAL HIGH (ref 70–99)
Glucose-Capillary: 306 mg/dL — ABNORMAL HIGH (ref 70–99)
Glucose-Capillary: 319 mg/dL — ABNORMAL HIGH (ref 70–99)
Glucose-Capillary: 276 mg/dL — ABNORMAL HIGH (ref 70–99)
Glucose-Capillary: 223 mg/dL — ABNORMAL HIGH (ref 70–99)

## 2020-03-23 LAB — BASIC METABOLIC PANEL
Anion gap: 12 (ref 5–15)
BUN: 50 mg/dL — ABNORMAL HIGH (ref 8–23)
CO2: 18 mmol/L — ABNORMAL LOW (ref 22–32)
Calcium: 12.7 mg/dL — ABNORMAL HIGH (ref 8.9–10.3)
Chloride: 106 mmol/L (ref 98–111)
Creatinine, Ser: 1.62 mg/dL — ABNORMAL HIGH (ref 0.44–1.00)
GFR, Estimated: 35 mL/min — ABNORMAL LOW (ref >60)
Glucose, Bld: 506 mg/dL (ref 70–99)
Potassium: 4.3 mmol/L (ref 3.5–5.1)
Sodium: 136 mmol/L (ref 135–145)

## 2020-03-23 LAB — PTH, INTACT AND CALCIUM
Calcium, Total (PTH): 14.9 mg/dL (ref 8.7–10.3)
PTH: 268 pg/mL — ABNORMAL HIGH (ref 15–65)

## 2020-03-23 LAB — CBG MONITORING, ED
Glucose-Capillary: 384 mg/dL — ABNORMAL HIGH (ref 70–99)
Glucose-Capillary: 404 mg/dL — ABNORMAL HIGH (ref 70–99)
Glucose-Capillary: 407 mg/dL — ABNORMAL HIGH (ref 70–99)

## 2020-03-23 LAB — T3, FREE: T3, Free: 1.5 pg/mL — ABNORMAL LOW (ref 2.0–4.4)

## 2020-03-23 SURGERY — IRRIGATION AND DEBRIDEMENT EXTREMITY
Anesthesia: General | Site: Elbow | Laterality: Left

## 2020-03-23 MED ORDER — PHENYLEPHRINE HCL (PRESSORS) 10 MG/ML IV SOLN
INTRAVENOUS | Status: AC
  Filled 2020-03-23: qty 2

## 2020-03-23 MED ORDER — AMISULPRIDE (ANTIEMETIC) 5 MG/2ML IV SOLN
INTRAVENOUS | Status: AC
  Filled 2020-03-23: qty 2

## 2020-03-23 MED ORDER — ACETAMINOPHEN 10 MG/ML IV SOLN
INTRAVENOUS | Status: AC
  Filled 2020-03-23: qty 100

## 2020-03-23 MED ORDER — METOCLOPRAMIDE HCL 5 MG PO TABS
5.0000 mg | ORAL_TABLET | Freq: Three times a day (TID) | ORAL | Status: DC | PRN
  Administered 2020-04-06: 10 mg via ORAL
  Filled 2020-03-23: qty 2

## 2020-03-23 MED ORDER — CEFAZOLIN SODIUM-DEXTROSE 2-4 GM/100ML-% IV SOLN
INTRAVENOUS | Status: AC
  Filled 2020-03-23: qty 100

## 2020-03-23 MED ORDER — ONDANSETRON HCL 4 MG PO TABS: 4.0000 mg | ORAL_TABLET | Freq: Four times a day (QID) | ORAL | Status: DC | PRN

## 2020-03-23 MED ORDER — INSULIN ASPART 100 UNIT/ML ~~LOC~~ SOLN
8.0000 [IU] | Freq: Once | SUBCUTANEOUS | Status: AC
  Administered 2020-03-23: 8 [IU] via SUBCUTANEOUS
  Filled 2020-03-23: qty 0.08

## 2020-03-23 MED ORDER — ONDANSETRON HCL 4 MG/2ML IJ SOLN
INTRAMUSCULAR | Status: AC
  Filled 2020-03-23: qty 2

## 2020-03-23 MED ORDER — ARTIFICIAL TEARS OPHTHALMIC OINT
TOPICAL_OINTMENT | OPHTHALMIC | Status: AC
  Filled 2020-03-23: qty 3.5

## 2020-03-23 MED ORDER — FENTANYL CITRATE (PF) 100 MCG/2ML IJ SOLN
INTRAMUSCULAR | Status: DC | PRN
  Administered 2020-03-23: 50 ug via INTRAVENOUS

## 2020-03-23 MED ORDER — ROCURONIUM BROMIDE 10 MG/ML (PF) SYRINGE
PREFILLED_SYRINGE | INTRAVENOUS | Status: AC
  Filled 2020-03-23: qty 10

## 2020-03-23 MED ORDER — MIDAZOLAM HCL 2 MG/2ML IJ SOLN
INTRAMUSCULAR | Status: AC
  Filled 2020-03-23: qty 2

## 2020-03-23 MED ORDER — MUPIROCIN 2 % EX OINT
1.0000 "application " | TOPICAL_OINTMENT | Freq: Two times a day (BID) | CUTANEOUS | Status: AC
  Administered 2020-03-23 – 2020-03-28 (×9): 1 via NASAL
  Filled 2020-03-23 (×3): qty 22

## 2020-03-23 MED ORDER — ACETAMINOPHEN 500 MG PO TABS: 1000.0000 mg | ORAL_TABLET | Freq: Once | ORAL | Status: DC

## 2020-03-23 MED ORDER — LACTATED RINGERS IV SOLN: INTRAVENOUS | Status: DC

## 2020-03-23 MED ORDER — PHENYLEPHRINE 40 MCG/ML (10ML) SYRINGE FOR IV PUSH (FOR BLOOD PRESSURE SUPPORT)
PREFILLED_SYRINGE | INTRAVENOUS | Status: DC | PRN
  Administered 2020-03-23 (×5): 80 ug via INTRAVENOUS

## 2020-03-23 MED ORDER — DOCUSATE SODIUM 100 MG PO CAPS
100.0000 mg | ORAL_CAPSULE | Freq: Two times a day (BID) | ORAL | Status: DC
  Administered 2020-03-23 – 2020-04-12 (×22): 100 mg via ORAL
  Filled 2020-03-23 (×31): qty 1

## 2020-03-23 MED ORDER — PHENYLEPHRINE HCL-NACL 20-0.9 MG/250ML-% IV SOLN
INTRAVENOUS | Status: DC | PRN
  Administered 2020-03-23: 40 ug/min via INTRAVENOUS

## 2020-03-23 MED ORDER — PROPOFOL 10 MG/ML IV BOLUS
INTRAVENOUS | Status: AC
  Filled 2020-03-23: qty 20

## 2020-03-23 MED ORDER — MIDAZOLAM HCL 5 MG/5ML IJ SOLN
INTRAMUSCULAR | Status: DC | PRN
  Administered 2020-03-23: 1 mg via INTRAVENOUS

## 2020-03-23 MED ORDER — 0.9 % SODIUM CHLORIDE (POUR BTL) OPTIME
TOPICAL | Status: DC | PRN
  Administered 2020-03-23: 1000 mL

## 2020-03-23 MED ORDER — IOHEXOL 300 MG/ML  SOLN
80.0000 mL | Freq: Once | INTRAMUSCULAR | Status: AC | PRN
  Administered 2020-03-23: 80 mL via INTRAVENOUS

## 2020-03-23 MED ORDER — MUPIROCIN 2 % EX OINT
TOPICAL_OINTMENT | CUTANEOUS | Status: AC
  Filled 2020-03-23: qty 22

## 2020-03-23 MED ORDER — SUGAMMADEX SODIUM 200 MG/2ML IV SOLN
INTRAVENOUS | Status: DC | PRN
  Administered 2020-03-23: 375.6 mg via INTRAVENOUS

## 2020-03-23 MED ORDER — LIDOCAINE HCL (PF) 2 % IJ SOLN
INTRAMUSCULAR | Status: AC
  Filled 2020-03-23: qty 5

## 2020-03-23 MED ORDER — HYDROMORPHONE HCL 1 MG/ML IJ SOLN: 0.2500 mg | INTRAMUSCULAR | Status: DC | PRN

## 2020-03-23 MED ORDER — INSULIN ASPART 100 UNIT/ML ~~LOC~~ SOLN
SUBCUTANEOUS | Status: AC
  Filled 2020-03-23: qty 1

## 2020-03-23 MED ORDER — PROPOFOL 10 MG/ML IV BOLUS
INTRAVENOUS | Status: DC | PRN
  Administered 2020-03-23: 50 mg via INTRAVENOUS

## 2020-03-23 MED ORDER — LIDOCAINE 2% (20 MG/ML) 5 ML SYRINGE
INTRAMUSCULAR | Status: DC | PRN
  Administered 2020-03-23: 80 mg via INTRAVENOUS

## 2020-03-23 MED ORDER — ACETAMINOPHEN 10 MG/ML IV SOLN
INTRAVENOUS | Status: DC | PRN
  Administered 2020-03-23: 1000 mg via INTRAVENOUS

## 2020-03-23 MED ORDER — APREPITANT 40 MG PO CAPS: 40.0000 mg | ORAL_CAPSULE | Freq: Once | ORAL | Status: DC

## 2020-03-23 MED ORDER — INSULIN ASPART 100 UNIT/ML ~~LOC~~ SOLN
10.0000 [IU] | SUBCUTANEOUS | Status: AC
  Administered 2020-03-23: 10 [IU] via SUBCUTANEOUS
  Filled 2020-03-23: qty 1

## 2020-03-23 MED ORDER — CEFAZOLIN SODIUM-DEXTROSE 2-4 GM/100ML-% IV SOLN
2.0000 g | Freq: Three times a day (TID) | INTRAVENOUS | Status: DC
  Administered 2020-03-23 – 2020-03-30 (×21): 2 g via INTRAVENOUS
  Filled 2020-03-23 (×25): qty 100

## 2020-03-23 MED ORDER — ROCURONIUM BROMIDE 10 MG/ML (PF) SYRINGE
PREFILLED_SYRINGE | INTRAVENOUS | Status: DC | PRN
  Administered 2020-03-23: 40 mg via INTRAVENOUS

## 2020-03-23 MED ORDER — ONDANSETRON HCL 4 MG/2ML IJ SOLN: 4.0000 mg | Freq: Four times a day (QID) | INTRAMUSCULAR | Status: DC | PRN

## 2020-03-23 MED ORDER — METOCLOPRAMIDE HCL 5 MG/ML IJ SOLN
5.0000 mg | Freq: Three times a day (TID) | INTRAMUSCULAR | Status: DC | PRN
  Administered 2020-03-25 – 2020-04-05 (×2): 10 mg via INTRAVENOUS
  Filled 2020-03-23 (×2): qty 2

## 2020-03-23 MED ORDER — INSULIN ASPART 100 UNIT/ML ~~LOC~~ SOLN
10.0000 [IU] | Freq: Once | SUBCUTANEOUS | Status: AC
  Administered 2020-03-23: 10 [IU] via SUBCUTANEOUS

## 2020-03-23 MED ORDER — METOPROLOL TARTRATE 5 MG/5ML IV SOLN
5.0000 mg | INTRAVENOUS | Status: AC | PRN
Start: 2020-03-23 — End: 2020-03-24
  Administered 2020-03-23 – 2020-03-24 (×2): 5 mg via INTRAVENOUS
  Filled 2020-03-23 (×2): qty 5

## 2020-03-23 MED ORDER — ONDANSETRON HCL 4 MG/2ML IJ SOLN
INTRAMUSCULAR | Status: DC | PRN
  Administered 2020-03-23: 4 mg via INTRAVENOUS

## 2020-03-23 MED ORDER — AMISULPRIDE (ANTIEMETIC) 5 MG/2ML IV SOLN
5.0000 mg | Freq: Once | INTRAVENOUS | Status: AC
  Administered 2020-03-23: 5 mg via INTRAVENOUS

## 2020-03-23 MED ORDER — INSULIN ASPART 100 UNIT/ML ~~LOC~~ SOLN: 10.0000 [IU] | Freq: Once | SUBCUTANEOUS | Status: DC

## 2020-03-23 MED ORDER — FENTANYL CITRATE (PF) 100 MCG/2ML IJ SOLN
INTRAMUSCULAR | Status: AC
  Filled 2020-03-23: qty 2

## 2020-03-23 MED ORDER — SODIUM CHLORIDE 0.9 % IV SOLN
12.0000 g | INTRAVENOUS | Status: DC
  Filled 2020-03-23: qty 12000

## 2020-03-23 SURGICAL SUPPLY — 36 items
BAG ZIPLOCK 12X15 (MISCELLANEOUS) ×2 IMPLANT
BNDG ELASTIC 6X5.8 VLCR STR LF (GAUZE/BANDAGES/DRESSINGS) ×1 IMPLANT
BNDG GAUZE ELAST 4 BULKY (GAUZE/BANDAGES/DRESSINGS) ×1 IMPLANT
COVER SURGICAL LIGHT HANDLE (MISCELLANEOUS) ×2 IMPLANT
COVER WAND RF STERILE (DRAPES) IMPLANT
DRAIN PENROSE 0.25X18 (DRAIN) ×1 IMPLANT
DRAPE U-SHAPE 47X51 STRL (DRAPES) ×2 IMPLANT
DURAPREP 26ML APPLICATOR (WOUND CARE) ×2 IMPLANT
ELECT REM PT RETURN 15FT ADLT (MISCELLANEOUS) ×2 IMPLANT
GAUZE SPONGE 4X4 12PLY STRL (GAUZE/BANDAGES/DRESSINGS) ×1 IMPLANT
GLOVE BIO SURGEON STRL SZ7.5 (GLOVE) ×2 IMPLANT
GLOVE SRG 8 PF TXTR STRL LF DI (GLOVE) ×1 IMPLANT
GLOVE SURG ORTHO 8.0 STRL STRW (GLOVE) ×2 IMPLANT
GLOVE SURG UNDER POLY LF SZ8 (GLOVE) ×2
HANDPIECE INTERPULSE COAX TIP (DISPOSABLE) ×2
KIT BASIN OR (CUSTOM PROCEDURE TRAY) ×2 IMPLANT
KIT TURNOVER KIT A (KITS) IMPLANT
MANIFOLD NEPTUNE II (INSTRUMENTS) ×2 IMPLANT
NS IRRIG 1000ML POUR BTL (IV SOLUTION) ×2 IMPLANT
PACK ORTHO EXTREMITY (CUSTOM PROCEDURE TRAY) ×2 IMPLANT
PENCIL SMOKE EVACUATOR (MISCELLANEOUS) IMPLANT
PROTECTOR NERVE ULNAR (MISCELLANEOUS) ×1 IMPLANT
SET HNDPC FAN SPRY TIP SCT (DISPOSABLE) ×1 IMPLANT
SPONGE LAP 18X18 RF (DISPOSABLE) ×2 IMPLANT
STOCKINETTE 8 INCH (MISCELLANEOUS) ×1 IMPLANT
SUT ETHIBOND 5 LR DA (SUTURE) ×2 IMPLANT
SUT MNCRL AB 4-0 PS2 18 (SUTURE) ×1 IMPLANT
SUT PROLENE 2 0 CT2 30 (SUTURE) ×1 IMPLANT
SUT VIC AB 0 CT1 27 (SUTURE)
SUT VIC AB 0 CT1 27XBRD ANTBC (SUTURE) ×2 IMPLANT
SUT VIC AB 2-0 CT1 27 (SUTURE)
SUT VIC AB 2-0 CT1 TAPERPNT 27 (SUTURE) ×2 IMPLANT
SWAB COLLECTION DEVICE MRSA (MISCELLANEOUS) ×1 IMPLANT
SWAB CULTURE ESWAB REG 1ML (MISCELLANEOUS) ×1 IMPLANT
SYR 30ML LL (SYRINGE) IMPLANT
WATER STERILE IRR 1000ML POUR (IV SOLUTION) ×2 IMPLANT

## 2020-03-23 NOTE — Op Note (Signed)
Procedure(s): IRRIGATION AND DEBRIDEMENT elbow Procedure Note  MAIRIN LINDSLEY female 65 y.o. 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 female with unclear history due to mental status changes presented with multiple falls complaining of some elbow pain.  Incomplete MRI and CT scans demonstrated chronic osteomyelitis of the posterior olecranon and findings consistent with chronic infection of the olecranon bursal space.  She was also noted to have some tiny fluid collections in the adjacent musculature of the arm and forearm but after long discussion with the musculoskeletal radiologist did not feel that these collections were large enough to reliably be able to surgically debride.      Surgeon: [name]   Assistants: Jeanmarie Hubert PA-C [facility] was present and scrubbed throughout the procedure and was essential in positioning, retraction, exposure, and closure)  Anesthesia: General endotracheal anesthesia   Procedure Detail  IRRIGATION AND DEBRIDEMENT elbow  Findings: There was thickened bursa and pus in the posterior olecranon bursal space consistent with chronic infection.  There was involvement of the proximal olecranon with significant bony destruction and involvement of the triceps insertion.  Estimated Blood Loss:  Minimal         Drains: 1/4 inch Penrose drain percutaneous  Blood Given: none         Specimens: none        Complications:  * No complications entered in OR log *         Disposition: PACU - hemodynamically stable.         Condition: stable    Procedure: The patient was brought to the operating room and was placed  supine on the operative table.  General anesthesia was used.  A nonsterile tourniquet was applied and the operative extremity was prepped and draped in the standard sterile fashion.  The limb was elevated for approximately 1 minute and the tourniquet was elevated to 250 mm mercury.  A 8 cm incision was made longitudinally over the posterior elbow.  Immediate pus was noted.  Cultures were sent.  There was significant thickened olecranon bursal tissue which was excised sharply and sent for culture as well.  The triceps insertion was noted to be essentially completely disrupted and the posterior olecranon cortex was all gone.  A small rondure was used to debride the area and there was noted to be pus in the bone.  I was able to get back to more healthy-appearing bleeding bone, but likely removed about a cubic centimeter of necrotic infected bone. The entire area was then copiously irrigated with pulse lavage using normal saline.  1/4 inch Penrose drain was then placed percutaneously laterally.  The wound was then closed with #2 Prolene suture in a horizontal mattress fashion.  Sterile dressing was applied with an Ace bandage.  The tourniquet was let down.  The patient was then awakened transferred to a stretcher and taken to the recovery room in stable condition.  Postoperative plan: Patient will be readmitted to the hospitalist service.  Antibiotics per primary team and infectious disease.  We will follow along.

## 2020-03-23 NOTE — Progress Notes (Signed)
PT Cancellation Note  Patient Details Name: LATOYA MAULDING MRN: [id number] DOB: 04-Nov-1955   Cancelled Treatment:    Reason Eval/Treat Not Completed: Patient at procedure or test/unavailable, surgery today. Will check back tomorrow.    Claretha Cooper 03/16/2020,[place]ri[facility] [place] Pager [phone] Office [phone]  11:51 AM

## 2020-03-23 NOTE — Telephone Encounter (Signed)
Pt admitted

## 2020-03-23 NOTE — Progress Notes (Signed)
PHARMACY NOTE:  ANTIMICROBIAL RENAL DOSAGE ADJUSTMENT  Current antimicrobial regimen includes a mismatch between antimicrobial dosage and estimated renal function.  As per policy approved by the Pharmacy & Therapeutics and Medical Executive Committees, the antimicrobial dosage will be adjusted accordingly.  Current antimicrobial dosage:  Nafcillin 2 gm IV Q 4 hours  Indication: MSSA bacteremia   Renal Function:  Estimated Creatinine Clearance: 44.3 mL/min (A) (by C-G formula based on SCr of 1.62 mg/dL (H)). []      On intermittent HD, scheduled: []      On CRRT    Antimicrobial dosage has been changed to:  Nafcillin 12 gm every 24 hours as a continuous infusion   Additional comments: Changing to continuous infusion for ease of administration    Thank you for allowing pharmacy to be a part of this patient's care.  Jimmy Footman, PharmD, BCPS, BCIDP Infectious Diseases Clinical Pharmacist Phone: [phone] 04/01/2020 9:52 AM

## 2020-03-23 NOTE — Plan of Care (Signed)
  Problem: Education: Goal: Knowledge of General Education information will improve Description Including pain rating scale, medication(s)/side effects and non-pharmacologic comfort measures Outcome: Progressing   

## 2020-03-23 NOTE — Progress Notes (Signed)
[facility]  PROGRESS NOTE  Deborah Figueroa VZD:[id number] DOB: [birth year] DOA: 03/18/2020 PCP: Ann Held, DO Admit date - 04/03/2020   Admitting Physician Jonnie Finner, DO  Outpatient Primary MD for the patient is Carollee Herter, Alferd Apa, DO  LOS - 2 Brief Narrative   Deborah Figueroa is a 65 y.o. year old female with medical history significant for poorly controlled Type 2 Diabetes, depression, HTN, HLD, recent history of recurrent falls requiring ED visits on 12/18 and 12/19 with no concerning findings on imaging at that time and treated supportively, as well as elevated calcium being evaluated as outpatient with concern for her likely hyperparathyroidism with plan to see surgeon on 1/12 who presented on 1/8 after having another fall in her hotel room and per chart review evaluated by EMS noted to have elevated CBG in the setting of increased thirst, polyuria and recently changed diabetic medications.  Patient is a poor historian as she fixates on her extreme level of thirst and is easily distractible.  Last Ed visit on 03/19/20 with XR of left elbow for evaluation of left elbow pain with recent fall and concerning for irregularity of the olecranon with possibility of underlying osteomyelitis and recommended MRI if clinically indicated. Lumbar spine XR, Right shoulder XR, and CT head at that time was negative for any acute findings except for periorbital hematoma  In the ED T-max 98.5, heart rate range 110-125, respiratory rate 24-30, initial blood pressure 140/84.  Glucose 650, CO2 13, calcium 13.3, anion gap 19, sodium 132.  UA unremarkable, beta hydroxybutyrate 6.21, pH 7.2 on VBG with CO2 35.7, COVID test negative, WBC 18, hemoglobin 7.7 (previous baseline 9.5 on 12/20).  Chest x-ray was nonacute.  Patient was started on IV insulin drip for management of DKA and type II diabetic as well as IV fluids for hypercalcemia of unclear etiology.  Hospital course complicated by blood  cultures obtained on admission positive for GPC in clusters in the blood culture ID detecting MSSA and patient started on cefazolin, as well as worsening calcium greater than 15 for which patient was increased on IV fluids, given calcitonin and zoledronic acid.   Subjective  Knows where she is, still complaining of her extreme thirst. A & P  Sepsis secondary to MSSA bacteremia with high concern for endocarditis and septic phenomenon including chronic left Olecranon bursitis with osteomyelitis, with associated phlegmon  Anand small abscesses in the lower biceps muscle.  MRI of left elbow concerning for osteomyelitis and septic arthritis limited by motion but CT negative for septic arthritis.  MRI brain negative for CNS involvement  - Status post I&D of elbow by Ortho on 04/06/2020 - Tiny fluid collections consistent with small abscesses in the musculature too small to reliably surgically debride, if increase in size may warrant ultrasound-guided drainage - Return to IV cefazolin and discontinue nafcillin given no concern for CNS involvement --MRI of left elbow given bacteremia and stability from DKA now - Will need TTE, currently n.p.o. for TEE on 03/20/2020 --Appreciate ID recommendation,  - We will need to repeat blood cultures to ensure resolution in 48 hours - Check lactic acid  Severe hypercalcemia secondary to hyperparathyroidism, symptomatic.  At baseline has elevated calcium of 12 and per outpatient notes with some concern for hyperparathyroidism but has not been evaluated formally though followed by endocrinology with plan for surgical involvement.  Calcium had improved with calcitonin x2, bisphosphonate and IV fluids on 1/9, but again elevated at 14.6  PTH elevated significantly,e is alert and oriented x4 but does have some distal weakness in upper extremities and confused - Continue LR infusion at 100 cc an hour over the next 24 hours - Repeat calcitonin for 2 additional doses) level  complete full dose regimen) - Status post zoledronic bisphosphonate on, 1/9 - Low threshold to consult renal if output diminishes her kidney impairment worsens  Weakness with recurrent falls.  Likely secondary to ongoing hypercalcemia further exacerbated by MSSA bacteremia and presentation of DKA.  MRI brain negative for any acute CNS involvement  She is oriented x4 but very slow in speech.  -- Continue antibiotics and IV fluids  Type 2 diabetes with DKA.   DKA now resolved History of poor control and poor adherence to regimen.  A1c 10.4 On admission elevated butyrate acid, anion gap of 19, CBGs in 600 on admission, likely exacerbated in setting of worsening hypercalcemia and bacteremia of unclear etiology.  Anion gap is now closed.  Home regimen includes glipizide, Lantus 10 units - Patient not eating much and currently n.p.o. for midnight for TEE procedure, continue reduced Lantus at 5 units - Continue to closely monitor CBGs,  - Currently still n.p.o.  AKI on CKD, likely prerenal in the setting of dehydration from DKA as well as some insult from sepsis physiology.  Further complicated by hypercalcemia.  Baseline creatinine from 09/2019 was 1.88, was 2.23 on admission, improved with IV fluids to 1.65 - Avoid nephrotoxins - Continue normal saline and closely monitor output - BMP daily holding home lisinopril/HCTZ  Acute on chronic anemia, normocytic. Baseline hemoglobin 9.5 on 02/14/2020).  On admission hemoglobin was 7.7, on repeat 8.9.  No reported bleeding episodes.  Could be acutely worse in the setting of illness - Continue closely monitor - Trend CBC, transfuse for hemoglobin less than 7 - Check iron panel, I62, folic acid  Suppressed TSH, likely euthyroid sick syndrome given free T4 within normal limits.  Likely in the setting of acute illness  Hypertension.  Initially hypertensive on admission however given ongoing sepsis physiology will hold off on home blood pressure  medications - We will hold home lisinopril/HCTZ, Toprol  Depression, stable -continue home Zoloft     Family Communication  : Updated son on phone on 1/10  Code Status :  FULL  Disposition Plan  :  Patient is from home. Anticipated d/c date: > 3 days. Barriers to d/c or necessity for inpatient status: IV cefazolin for bacteremia, IVF and calcitonin and bisphosphonates for hypercalcemia, needs close monitoring of kidney function, needs TEE, Consults  :  Infectious Disease, orthopedics  Procedures  :  Will need TEE Status post I&D of left elbow on 03/15/2018   DVT Prophylaxis  :  SCDs, while evaluating anemia MDM: The below labs and imaging reports were reviewed and summarized above.  Medication management as above.  Lab Results  Component Value Date   PLT 398 03/29/2020    Diet :  Diet Order            Diet NPO time specified  Diet effective midnight           Diet Carb Modified Fluid consistency: Thin; Room service appropriate? Yes  Diet effective now                  Inpatient Medications Scheduled Meds: . atorvastatin  40 mg Oral Daily  . calcitonin  4 Units/kg Intramuscular BID  . docusate sodium  100 mg Oral BID  . fenofibrate  160 mg Oral Daily  . insulin aspart      . insulin aspart  0-9 Units Subcutaneous Q4H  . insulin glargine  5 Units Subcutaneous QHS  . mupirocin ointment  1 application Nasal BID   Continuous Infusions: .  ceFAZolin (ANCEF) IV    . lactated ringers 150 mL/hr at 03/21/2020 1613   PRN Meds:.dextrose, metoCLOPramide **OR** metoCLOPramide (REGLAN) injection, metoprolol tartrate, ondansetron **OR** ondansetron (ZOFRAN) IV  Antibiotics  :   Anti-infectives (From admission, onward)   Start     Dose/Rate Route Frequency Ordered Stop   04/05/2020 1145  ceFAZolin (ANCEF) IVPB 2g/100 mL premix        2 g 200 mL/hr over 30 Minutes Intravenous Every 8 hours 04/07/2020 1055     03/21/2020 1100  nafcillin 12 g in sodium chloride 0.9 % 500 mL  continuous infusion  Status:  Discontinued        12 g 20.8 mL/hr over 24 Hours Intravenous Every 24 hours 04/05/2020 0949 03/14/2020 1055   03/22/20 1500  nafcillin 2 g in sodium chloride 0.9 % 100 mL IVPB  Status:  Discontinued        2 g 200 mL/hr over 30 Minutes Intravenous Every 4 hours 03/22/20 1354 04/06/2020 0949   03/22/20 1345  nafcillin injection 2 g  Status:  Discontinued        2 g Intravenous Every 4 hours 03/22/20 1344 03/22/20 1354   03/22/20 1300  ceFAZolin (ANCEF) IVPB 2g/100 mL premix  Status:  Discontinued        2 g 200 mL/hr over 30 Minutes Intravenous Every 8 hours 03/22/20 1257 03/22/20 1344       Objective   Vitals:   04/13/2020 1515 03/25/2020 1530 03/24/2020 1550 04/09/2020 1745  BP: 114/67 106/64 129/79 116/64  Pulse: (!) 107 (!) 109 (!) 116 (!) 111  Resp: (!) 21 (!) 25 14 (!) 24  Temp:  97.9 F (36.6 C) 98.3 F (36.8 C) 98.8 F (37.1 C)  TempSrc:   Oral Oral  SpO2: 98% 94% 97% 96%  Weight:      Height:        SpO2: 96 % O2 Flow Rate (L/min): 2 L/min  Wt Readings from Last 3 Encounters:  03/26/2020 93.9 kg  03/19/20 93.9 kg  02/29/20 89.8 kg     Intake/Output Summary (Last 24 hours) at 04/01/2020 2044 Last data filed at 04/12/2020 1700 Gross per 24 hour  Intake 1699.94 ml  Output 410 ml  Net 1289.94 ml    Physical Exam:     Awake Alert, Oriented to self, place, time, context, slow speech Moves lower extremities with no deficits Distal weakness in bilateral hands with weak hand grip No dysarthria or facial droop Following commands Dry oral mucosa, peeling lips Normal respiratory effort on room air, CTAB RRR,No Gallops,Rubs or new Murmurs,  +ve B.Sounds, Abd Soft, No tenderness, No rebound, guarding or rigidity. Left elbow with circular area of erythema near olecranon and some very mild tenderness with no appreciated fluctuance or induration Bruising of right periorbital area   I have personally reviewed the following:   Data  Reviewed:  CBC Recent Labs  Lab 03/31/2020 1247 03/22/20 0924 03/19/2020 0315 04/12/2020 0952  WBC 18.0* 18.9* 19.5* 18.4*  HGB 7.7* 8.9* 8.7* 9.1*  HCT 24.7* 27.6* 27.3* 28.7*  PLT 469* 457* 409* 398  MCV 91.8 86.5 86.9 86.4  MCH 28.6 27.9 27.7 27.4  MCHC 31.2 32.2 31.9 31.7  RDW 15.6* 15.1  15.5 15.2  LYMPHSABS  --   --   --  0.3*  MONOABS  --   --   --  0.3  EOSABS  --   --   --  0.0  BASOSABS  --   --   --  0.0    Chemistries  Recent Labs  Lab 03/19/20 1047 04/04/2020 0138 03/22/20 1416 03/22/20 1825 03/22/20 1954 03/21/2020 0115 03/18/2020 0952  NA 127*   < > 140 139 139 136 140  K 4.5   < > 3.4* 4.9 4.0 4.3 3.6  CL 92*   < > 109 108 109 106 110  CO2 21   < > 22 21* 19* 18* 20*  GLUCOSE 589*   < > 118* 186* 336* 506* 408*  BUN 73*   < > 53* 52* 51* 50* 47*  CREATININE 2.17*   < > 1.54* 1.67* 1.61* 1.62* 1.65*  CALCIUM 13.9*   < > >15.0* 13.9* 13.2* 12.7* 12.6*  AST 10  --   --   --   --   --  14*  ALT 18  --   --   --   --   --  21  ALKPHOS 125*  --   --   --   --   --  99  BILITOT 0.6  --   --   --   --   --  1.7*   < > = values in this interval not displayed.   ------------------------------------------------------------------------------------------------------------------ No results for input(s): CHOL, HDL, LDLCALC, TRIG, CHOLHDL, LDLDIRECT in the last 72 hours.  Lab Results  Component Value Date   HGBA1C 10.4 (H) 03/19/2020   ------------------------------------------------------------------------------------------------------------------ Recent Labs    04/13/2020 0314  T3FREE 1.5*   ------------------------------------------------------------------------------------------------------------------ No results for input(s): VITAMINB12, FOLATE, FERRITIN, TIBC, IRON, RETICCTPCT in the last 72 hours.  Coagulation profile No results for input(s): INR, PROTIME in the last 168 hours.  No results for input(s): DDIMER in the last 72 hours.  Cardiac Enzymes No  results for input(s): CKMB, TROPONINI, MYOGLOBIN in the last 168 hours.  Invalid input(s): CK ------------------------------------------------------------------------------------------------------------------    Component Value Date/Time   BNP 47.8 02/14/2019 2147    Micro Results Recent Results (from the past 240 hour(s))  SARS CORONAVIRUS 2 (TAT 6-24 HRS) Nasopharyngeal Nasopharyngeal Swab     Status: None   Collection Time: 03/22/2020  2:13 PM   Specimen: Nasopharyngeal Swab  Result Value Ref Range Status   SARS Coronavirus 2 NEGATIVE NEGATIVE Final    Comment: (NOTE) SARS-CoV-2 target nucleic acids are NOT DETECTED.  The SARS-CoV-2 RNA is generally detectable in upper and lower respiratory specimens during the acute phase of infection. Negative results do not preclude SARS-CoV-2 infection, do not rule out co-infections with other pathogens, and should not be used as the sole basis for treatment or other patient management decisions. Negative results must be combined with clinical observations, patient history, and epidemiological information. The expected result is Negative.  Fact Sheet for Patients: SugarRoll.be  Fact Sheet for Healthcare Providers: https://www.woods-mathews.com/  This test is not yet approved or cleared by the [place] FDA and  has been authorized for detection and/or diagnosis of SARS-CoV-2 by FDA under an Emergency Use Authorization (EUA). This EUA will remain  in effect (meaning this test can be used) for the duration of the COVID-19 declaration under Se ction 564(b)(1) of the Act, 21 U.S.C. section 360bbb-3(b)(1), unless the authorization is terminated or revoked sooner.  Performed at [facility], [place] [address], [place], [place] [address]   Culture, blood (routine x 2)     Status: Abnormal (Preliminary result)   Collection Time: 04/09/2020  5:08 PM   Specimen: BLOOD  Result Value Ref Range  Status   Specimen Description   Final    BLOOD UNKNOWN Performed at [facility] [address], [place], [place] [address]    Special Requests   Final    BOTTLES DRAWN AEROBIC AND ANAEROBIC Blood Culture adequate volume Performed at [facility] [address], [place], [place] [address]    Culture  Setup Time   Final    GRAM POSITIVE COCCI IN BOTH AEROBIC AND ANAEROBIC BOTTLES Organism ID to follow CRITICAL RESULT CALLED TO, READ BACK BY AND VERIFIED WITH: PHARMD C.PIERCE AT 1200 ON 03/22/2020 BY T.SAAD    Culture (A)  Final    STAPHYLOCOCCUS AUREUS SUSCEPTIBILITIES TO FOLLOW Performed at [facility], [place] [address], [place], [place] [address]    Report Status PENDING  Incomplete  Blood Culture ID Panel (Reflexed)     Status: Abnormal   Collection Time: 04/01/2020  5:08 PM  Result Value Ref Range Status   Enterococcus faecalis NOT DETECTED NOT DETECTED Final   Enterococcus Faecium NOT DETECTED NOT DETECTED Final   Listeria monocytogenes NOT DETECTED NOT DETECTED Final   Staphylococcus species DETECTED (A) NOT DETECTED Final    Comment: CRITICAL RESULT CALLED TO, READ BACK BY AND VERIFIED WITH: PHARMD C.PIERCE AT 1200 ON 03/22/2020 BY T.SAAD    Staphylococcus aureus (BCID) DETECTED (A) NOT DETECTED Final    Comment: CRITICAL RESULT CALLED TO, READ BACK BY AND VERIFIED WITH: PHARMD C.PIERCE AT 1200 ON 03/22/2020 BY T.SAAD    Staphylococcus epidermidis NOT DETECTED NOT DETECTED Final   Staphylococcus lugdunensis NOT DETECTED NOT DETECTED Final   Streptococcus species NOT DETECTED NOT DETECTED Final   Streptococcus agalactiae NOT DETECTED NOT DETECTED Final   Streptococcus pneumoniae NOT DETECTED NOT DETECTED Final   Streptococcus pyogenes NOT DETECTED NOT DETECTED Final   A.calcoaceticus-baumannii NOT DETECTED NOT DETECTED Final   Bacteroides fragilis NOT DETECTED NOT DETECTED Final   Enterobacterales NOT DETECTED NOT DETECTED  Final   Enterobacter cloacae complex NOT DETECTED NOT DETECTED Final   Escherichia coli NOT DETECTED NOT DETECTED Final   Klebsiella aerogenes NOT DETECTED NOT DETECTED Final   Klebsiella oxytoca NOT DETECTED NOT DETECTED Final   Klebsiella pneumoniae NOT DETECTED NOT DETECTED Final   Proteus species NOT DETECTED NOT DETECTED Final   Salmonella species NOT DETECTED NOT DETECTED Final   Serratia marcescens NOT DETECTED NOT DETECTED Final   Haemophilus influenzae NOT DETECTED NOT DETECTED Final   Neisseria meningitidis NOT DETECTED NOT DETECTED Final   Pseudomonas aeruginosa NOT DETECTED NOT DETECTED Final   Stenotrophomonas maltophilia NOT DETECTED NOT DETECTED Final   Candida albicans NOT DETECTED NOT DETECTED Final   Candida auris NOT DETECTED NOT DETECTED Final   Candida glabrata NOT DETECTED NOT DETECTED Final   Candida krusei NOT DETECTED NOT DETECTED Final   Candida parapsilosis NOT DETECTED NOT DETECTED Final   Candida tropicalis NOT DETECTED NOT DETECTED Final   Cryptococcus neoformans/gattii NOT DETECTED NOT DETECTED Final   Meth resistant mecA/C and MREJ NOT DETECTED NOT DETECTED Final    Comment: Performed at [facility], [address], [place], [place] [address]  Culture, Urine     Status: Abnormal   Collection Time: 04/05/2020 11:48 PM   Specimen:  Urine, Catheterized  Result Value Ref Range Status   Specimen Description   Final    URINE, CATHETERIZED Performed at [facility] [address], [place], [place] [address]    Special Requests   Final    NONE Performed at [facility], [place] [address], [place], [place] [address]    Culture MULTIPLE SPECIES PRESENT, SUGGEST RECOLLECTION (A)  Final   Report Status 04/01/2020 FINAL  Final  Culture, blood (Routine X 2) w Reflex to ID Panel     Status: None (Preliminary result)   Collection Time: 03/22/20  2:16 PM   Specimen: BLOOD RIGHT HAND  Result Value Ref Range Status    Specimen Description   Final    BLOOD RIGHT HAND Performed at [facility] [address] [place], [place] [address]    Special Requests   Final    BOTTLES DRAWN AEROBIC AND ANAEROBIC Blood Culture results may not be optimal due to an inadequate volume of blood received in culture bottles Performed at [facility] [address] [place], [place] [address]    Culture  Setup Time   Final    GRAM POSITIVE COCCI IN CLUSTERS IN BOTH AEROBIC AND ANAEROBIC BOTTLES CRITICAL RESULT CALLED TO, READ BACK BY AND VERIFIED WITH: PHARMD J GADHIA [id number] AT 723 AM BY CM Performed at [facility], [place] [address], [place], [place] [address]    Culture GRAM POSITIVE COCCI IN CLUSTERS  Final   Report Status PENDING  Incomplete  Surgical PCR screen     Status: Abnormal   Collection Time: 04/08/2020 10:08 AM   Specimen: Nasal Mucosa; Nasal Swab  Result Value Ref Range Status   MRSA, PCR NEGATIVE NEGATIVE Final   Staphylococcus aureus POSITIVE (A) NEGATIVE Final    Comment: (NOTE) The Xpert SA Assay (FDA approved for NASAL specimens in patients 69 years of age and older), is one component of a comprehensive surveillance program. It is not intended to diagnose infection nor to guide or monitor treatment. Performed at [facility], [place] [address] [place], [place] [address]     Radiology Reports DG Lumbar Spine Complete  Result Date: 03/19/2020 CLINICAL DATA:  History of multiple falls with low back pain, initial encounter EXAM: LUMBAR SPINE - COMPLETE 4+ VIEW COMPARISON:  None. FINDINGS: Six non rib-bearing lumbar type vertebral bodies are visualized. Changes of prior fusion at L5-6 is noted. Mild osteophytic changes are seen. Facet hypertrophic changes are noted. No soft tissue abnormality is seen. IMPRESSION: Degenerative and postoperative changes without acute abnormality. Electronically Signed   By: Inez Catalina M.D.   On: 03/19/2020 12:29    DG Shoulder Right  Result Date: 03/19/2020 CLINICAL DATA:  Multiple recent falls with right shoulder pain, initial encounter EXAM: RIGHT SHOULDER - 2+ VIEW COMPARISON:  None. FINDINGS: No acute fracture or dislocation is noted. No soft tissue abnormality is seen. Old healed right rib fractures. IMPRESSION: No acute abnormality noted. Electronically Signed   By: Inez Catalina M.D.   On: 03/19/2020 12:21   DG Elbow Complete Left  Result Date: 03/19/2020 CLINICAL DATA:  History of multiple falls with left elbow pain, initial encounter EXAM: LEFT ELBOW - COMPLETE 3+ VIEW COMPARISON:  None. FINDINGS: No acute fracture or dislocation is noted. Considerable soft tissue swelling is noted over the olecranon with scattered soft tissue calcifications likely related to olecranon bursitis. The posterior aspect of the olecranon appears somewhat eroded. Possibility of osteomyelitis  would deserve consideration IMPRESSION: Changes most consistent with olecranon bursitis. Some irregularity of the olecranon on the lateral film is noted. Possibility of underlying osteomyelitis deserves consideration. MRI may be helpful as clinically indicated. Electronically Signed   By: Inez Catalina M.D.   On: 03/19/2020 12:24   CT Head Wo Contrast  Result Date: 03/19/2020 CLINICAL DATA:  Head trauma, initial encounter. Facial trauma; head trauma, minor, normal mental status. Additional history provided: Patient reports fall 2 weeks ago at work hitting back of head, fall 2 days ago on sidewalk hitting right forehead, swelling/contusion to right eye. EXAM: CT HEAD WITHOUT CONTRAST TECHNIQUE: Contiguous axial images were obtained from the base of the skull through the vertex without intravenous contrast. COMPARISON:  Head CT 02/09/2019. FINDINGS: Brain: Cerebral volume is normal for age. There is no acute intracranial hemorrhage. No demarcated cortical infarct. No extra-axial fluid collection. No evidence of intracranial mass. No midline  shift. Vascular: No hyperdense vessel. Skull: Normal. Negative for fracture or focal lesion. Sinuses/Orbits: Visualized orbits show no acute finding. No significant paranasal sinus disease at the imaged levels. Other: Partially imaged forehead/right periorbital hematoma. IMPRESSION: Unremarkable non-contrast CT appearance of the brain for age. No evidence of acute intracranial abnormality. Partially imaged forehead/right periorbital hematoma. Electronically Signed   By: Kellie Simmering DO   On: 03/19/2020 11:48   CT HUMERUS LEFT W CONTRAST  Result Date: 03/22/2020 CLINICAL DATA:  Evaluate olecranon osteomyelitis and abscesses. EXAM: CT OF THE UPPER LEFT EXTREMITY WITH CONTRAST TECHNIQUE: Multidetector CT imaging of the upper left extremity was performed according to the standard protocol following intravenous contrast administration. CONTRAST:  32mL OMNIPAQUE IOHEXOL 300 MG/ML  SOLN COMPARISON:  Limited MRI examination from 03/22/2020 FINDINGS: There are destructive bony changes involving the olecranon consistent with osteomyelitis. Adjacent inflammatory phlegmon and abscess noted in the region of the olecranon bursa likely septic bursitis. As seen on the MRI there is also a small fluid collection just anterior to the lower biceps muscle. This measured approximately 17 x 10 mm on the MRI and measures approximately 13 x 9 mm on the CT scan. There is also evidence of pyomyositis most likely in the flexor digitorum superficialis muscle of the proximal forearm. The larger abscess measures 15 x 14 mm and the smaller adjacent abscess measures 8 mm. Surrounding myositis and cellulitis is noted. I do not see any findings suspicious for septic arthritis at the elbow joint. IMPRESSION: 1. Osteomyelitis involving the olecranon with adjacent inflammatory phlegmon and abscess in the region of the olecranon bursa. 2. Evidence of pyomyositis most likely in the flexor digitorum superficialis muscle of the proximal forearm. 3.  Small abscess along the ventral aspect of the lower biceps muscle. 4. No CT findings suspicious for septic arthritis at the elbow joint. Electronically Signed   By: Marijo Sanes M.D.   On: 04/13/2020 13:32   MR BRAIN WO CONTRAST  Result Date: 03/22/2020 CLINICAL DATA:  Mental status change, multiple falls, right arm numbness EXAM: MRI HEAD WITHOUT CONTRAST TECHNIQUE: Multiplanar, multiecho pulse sequences of the brain and surrounding structures were obtained without intravenous contrast. COMPARISON:  None. FINDINGS: Brain: There is no acute infarction or intracranial hemorrhage. There is no intracranial mass, mass effect, or edema. There is no hydrocephalus or extra-axial fluid collection. Multiple small foci of susceptibility hypointensity with involvement of the basal ganglia, thalamus, and pons consistent with chronic hypertensive microhemorrhages. Few scattered small foci of T2 hyperintensity in the supratentorial white matter are nonspecific but may reflect minor chronic  microvascular ischemic changes. Ventricles and sulci are within normal limits in size and configuration. Vascular: Major vessel flow voids at the skull base are preserved. Skull and upper cervical spine: Marrow signal within normal limits. Sinuses/Orbits: Paranasal sinuses are aerated. Orbits are unremarkable. Other: Right frontal scalp hematoma. Sella is unremarkable. Mastoid air cells are clear. IMPRESSION: No evidence of recent infarction, hemorrhage, or mass. Chronic hypertensive microhemorrhages. Minor chronic microvascular ischemic changes. Electronically Signed   By: Macy Mis M.D.   On: 03/22/2020 16:36   CT FOREARM LEFT W CONTRAST  Result Date: 03/16/2020 CLINICAL DATA:  Evaluate olecranon osteomyelitis and abscesses. EXAM: CT OF THE UPPER LEFT EXTREMITY WITH CONTRAST TECHNIQUE: Multidetector CT imaging of the upper left extremity was performed according to the standard protocol following intravenous contrast  administration. CONTRAST:  65mL OMNIPAQUE IOHEXOL 300 MG/ML  SOLN COMPARISON:  Limited MRI examination from 03/22/2020 FINDINGS: There are destructive bony changes involving the olecranon consistent with osteomyelitis. Adjacent inflammatory phlegmon and abscess noted in the region of the olecranon bursa likely septic bursitis. As seen on the MRI there is also a small fluid collection just anterior to the lower biceps muscle. This measured approximately 17 x 10 mm on the MRI and measures approximately 13 x 9 mm on the CT scan. There is also evidence of pyomyositis most likely in the flexor digitorum superficialis muscle of the proximal forearm. The larger abscess measures 15 x 14 mm and the smaller adjacent abscess measures 8 mm. Surrounding myositis and cellulitis is noted. I do not see any findings suspicious for septic arthritis at the elbow joint. IMPRESSION: 1. Osteomyelitis involving the olecranon with adjacent inflammatory phlegmon and abscess in the region of the olecranon bursa. 2. Evidence of pyomyositis most likely in the flexor digitorum superficialis muscle of the proximal forearm. 3. Small abscess along the ventral aspect of the lower biceps muscle. 4. No CT findings suspicious for septic arthritis at the elbow joint. Electronically Signed   By: Marijo Sanes M.D.   On: 04/08/2020 13:32   US RENAL  Result Date: 04/03/2020 CLINICAL DATA:  Acute renal insufficiency. EXAM: RENAL / URINARY TRACT ULTRASOUND COMPLETE COMPARISON:  None. FINDINGS: Right Kidney: Renal measurements: 10.6 x 4.4 x 4.8 cm = volume: 117.1 mL. No significant hydronephrosis. Left Kidney: Renal measurements: 10.7 x 5.2 x 4.7 cm = volume: 137 mL. Contains a 1.4 cm cyst. Bladder: Appears normal for degree of bladder distention. Other: Cholelithiasis. IMPRESSION: 1. No cause for acute renal insufficiency. 2. Cholelithiasis. Electronically Signed   By: Dorise Bullion III M.D   On: 04/09/2020 19:00   MR ELBOW LEFT WO CONTRAST  Result  Date: 03/22/2020 CLINICAL DATA:  Left elbow pain.  MSSA bacteremia. EXAM: MRI OF THE LEFT ELBOW WITHOUT CONTRAST TECHNIQUE: Multiplanar, multisequence MR imaging of the elbow was performed. No intravenous contrast was administered. COMPARISON:  Left elbow x-rays dated March 19, 2020. FINDINGS: Incomplete study. The patient refused further imaging. Sagittal sequence is nondiagnostic. No coronal sequences were obtained. TENDONS Common forearm flexor origin: Intact. Common forearm extensor origin: Intact. Biceps: Intact. Triceps: High-grade partial tear at the insertion, incompletely evaluated. LIGAMENTS Medial stabilizers: Limited evaluation.  Grossly intact. Lateral stabilizers:  Limited evaluation.  Grossly intact. Cartilage: Not well evaluated by the axial sequences. Joint: Moderate joint effusion. Cubital tunnel: Increased T2 signal of the ulnar nerve within the cubital tunnel, likely reactive. Bones: Patchy marrow edema and heterogeneously decreased T1 marrow signal involving the olecranon and proximal ulna. Other: Proximal forearm flexor compartment intramuscular  fluid collection measuring 1.6 x 4.3 cm (series 9, images 1-10), with surrounding muscle edema. Additional incompletely visualized ill-defined fluid collection in the ulnar aspect of the distal upper arm medial to the brachialis muscle measuring 3.1 x 2.0 cm (series 9, image 30). Incompletely visualized 1.0 x 1.8 cm fluid collection in the distal upper arm anterior to the biceps myotendinous junction (series 9, image 30). IMPRESSION: 1. Limited, incomplete study. The patient refused further imaging. 2. 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 3. Moderate joint effusion concerning for septic arthritis. 4. Proximal forearm flexor compartment intramuscular fluid collection measuring 1.6 x 4.3 cm with surrounding muscle edema, concerning for abscess. 5. Additional incompletely visualized small fluid collections in the distal upper arm medial to the  brachialis muscle and anterior to the biceps myotendinous junction, also concerning for abscesses. 6. High-grade partial tear of the distal triceps tendon, incompletely evaluated. Electronically Signed   By: Titus Dubin M.D.   On: 03/22/2020 17:40   DG CHEST PORT 1 VIEW  Result Date: 03/27/2020 CLINICAL DATA:  Leukocytosis EXAM: PORTABLE CHEST 1 VIEW COMPARISON:  February 15, 2019 FINDINGS: The cardiomediastinal silhouette is unchanged in contour.Atherosclerotic calcifications of the aorta. Low lung volumes with bronchovascular crowding. No pleural effusion. No pneumothorax. No acute pleuroparenchymal abnormality. Visualized abdomen is unremarkable. Mild degenerative changes of the thoracic spine. IMPRESSION: No acute cardiopulmonary abnormality. Electronically Signed   By: Valentino Saxon MD   On: 03/29/2020 17:09     Time Spent in minutes  30     Desiree Hane M.D on 04/13/2020 at 8:44 PM  To page go to [email or web address] - password [facility]

## 2020-03-23 NOTE — Anesthesia Preprocedure Evaluation (Addendum)
Anesthesia Evaluation  Patient identified by MRN, date of birth, ID band Patient awake    Reviewed: Allergy & Precautions, H&P , NPO status , Patient's Chart, lab work & pertinent test results, reviewed documented beta blocker date and time   Airway Mallampati: III  TM Distance: >3 FB Neck ROM: Full    Dental no notable dental hx. (+) Poor Dentition, Dental Advisory Given   Pulmonary neg pulmonary ROS,    Pulmonary exam normal breath sounds clear to auscultation       Cardiovascular hypertension, Pt. on medications and Pt. on home beta blockers  Rhythm:Regular Rate:Normal     Neuro/Psych Depression negative neurological ROS     GI/Hepatic negative GI ROS, Neg liver ROS,   Endo/Other  negative endocrine ROSdiabetes, Poorly Controlled, Insulin Dependent, Oral Hypoglycemic Agents  Renal/GU Renal InsufficiencyRenal disease  negative genitourinary   Musculoskeletal   Abdominal   Peds  Hematology negative hematology ROS (+)   Anesthesia Other Findings   Reproductive/Obstetrics negative OB ROS                            Anesthesia Physical Anesthesia Plan  ASA: III  Anesthesia Plan: General   Post-op Pain Management:    Induction: Intravenous  PONV Risk Score and Plan: 4 or greater and Ondansetron, Aprepitant and Midazolam  Airway Management Planned: Oral ETT  Additional Equipment:   Intra-op Plan:   Post-operative Plan: Extubation in OR  Informed Consent: I have reviewed the patients History and Physical, chart, labs and discussed the procedure including the risks, benefits and alternatives for the proposed anesthesia with the patient or authorized representative who has indicated his/her understanding and acceptance.     Dental advisory given  Plan Discussed with: CRNA  Anesthesia Plan Comments:         Anesthesia Quick Evaluation

## 2020-03-23 NOTE — Progress Notes (Signed)
   03/25/2020 0915  Assess: MEWS Score  Level of Consciousness Alert  Assess: MEWS Score  MEWS Temp 0  MEWS Systolic 0  MEWS Pulse 2  MEWS RR 1  MEWS LOC 0  MEWS Score 3  MEWS Score Color Yellow  Assess: if the MEWS score is Yellow or Red  Were vital signs taken at a resting state? Yes  Focused Assessment No change from prior assessment  Early Detection of Sepsis Score *See Row Information* Medium  MEWS guidelines implemented *See Row Information* Yes  Treat  MEWS Interventions Escalated (See documentation below)  Take Vital Signs  Increase Vital Sign Frequency  Yellow: Q 2hr X 2 then Q 4hr X 2, if remains yellow, continue Q 4hrs  Escalate  MEWS: Escalate Yellow: discuss with charge nurse/RN and consider discussing with provider and RRT  Notify: Charge Nurse/RN  Name of Charge Nurse/RN Notified L.Currin,RN  Date Charge Nurse/RN Notified 03/17/2020  Time Charge Nurse/RN Notified [id number]

## 2020-03-23 NOTE — ED Notes (Signed)
0143-provider made aware of critical BGL

## 2020-03-23 NOTE — Anesthesia Postprocedure Evaluation (Signed)
Anesthesia Post Note  Patient: Deborah Figueroa  Procedure(s) Performed: IRRIGATION AND DEBRIDEMENT elbow (Left Elbow)     Patient location during evaluation: PACU Anesthesia Type: General Level of consciousness: awake and alert Pain management: pain level controlled Vital Signs Assessment: post-procedure vital signs reviewed and stable Respiratory status: spontaneous breathing, nonlabored ventilation, respiratory function stable and patient connected to nasal cannula oxygen Cardiovascular status: blood pressure returned to baseline and stable Postop Assessment: no apparent nausea or vomiting Anesthetic complications: no   No complications documented.  Last Vitals:  Vitals:   03/28/2020 1500 03/22/2020 1515  BP: 116/71 114/67  Pulse: (!) 105 (!) 107  Resp: (!) 23 (!) 21  Temp:    SpO2: 98% 98%    Last Pain:  Vitals:   04/07/2020 1500  TempSrc:   PainSc: Asleep                 [name],W. EDMOND

## 2020-03-23 NOTE — Interval H&P Note (Signed)
History and Physical Interval Note:  6/73/4193 79:02 PM  Deborah Figueroa  has presented today for surgery, with the diagnosis of infected left elbow.  The various methods of treatment have been discussed with the patient and family. After consideration of risks, benefits and other options for treatment, the patient has consented to  Procedure(s): IRRIGATION AND DEBRIDEMENT elbow (Left) as a surgical intervention.  The patient's history has been reviewed, patient examined, no change in status, stable for surgery.  I have reviewed the patient's chart and labs.  Questions were answered to the patient's satisfaction.     [name]

## 2020-03-23 NOTE — ED Notes (Signed)
Patient saturated the bed. NT & RN performed linen change, peri care & provided purewick

## 2020-03-23 NOTE — Progress Notes (Signed)
[facility]  Date of Admission:  03/15/2020        Abx: 1/09-c nafcillin  ASSESSMENT: 65 y.o. female non-ivdu with dm2 poorly controlled (recent hemoglobin A1c 10.4), htn/hlp, admitted 1/08 found to have DKA with fever-severe sepsis/aki, and mssa bacteremiain setting of several weeks progressive weakness, ground level falls, left elbow pain with xray suggestive of possible osseous involvement, and right shoulder pain  1/09 noncontrast left elbow mri was limited due to patient's refusal but mentions imaging osteomyelitis of olecranon and proximal ulna, moderate joint effusion concerning for septic arthritis, proximal forearm flexor compartment intramuscular fluid (1.6x4.3 cm), and small fluid collections upper arm  1/09 mri brain no acute pathology outside of chronic microvascular ischemic changes  1/08 and 1/09 bcx all growing positive.   1/10 assessment Agree this is probable endocarditis. She has osteomyelitis in which treatment duration will cover for endocarditis course. However even if tte is negative, I would get a tee for dx as this has implication in future management.  In the mean time, given high burden of bacteremia would be aggressive with source control. She'll benefit from ortho evaluation for mri finding of the elbow/arms/forearms  Her labs are showing some improvement (aki), but she still has moderate leukocytosis and has significant hypercalcemia and I worry with this degree of elevation she might have an underlying malignancy  As her brain mri is without meningeal enhancement/pyogenic focus otherwise, I think we can transition her nafcillin back to cefazolin. Her ams is likely related to sepsis and hypercalcemia, and dka.   Of note, she has chronic hypercalcemia and has been followed by endocrinology. There is suggestion of evaluation for parathyroidectomy. Tumbling upon this information and looking at pan body CT, I am less  concerned for cns occult malignancy. Potentially if she improves with iv fluid and calcium level control, we could avoid LP  PLAN: 1. Will transition nafcillin back to cefazolin to avoid hepatorenal toxicity now that cns involvement with mssa is less likely 2. Repeat blood culture tomorrow 3. F/u tte; even if tte is negative, would proceed with tee 4. Please discuss case with orthopedic surgery regarding the left upper extremity finding 5. Hypercalcemia w/u and treatment -- will defer to primary team  ------------ She is in the or for left elbow septic arthritis I&D. Will see tomorrow or later today if she is out early. I have discussed case with Dr Nettey/our team ID pharmacist  Principal Problem:   MSSA bacteremia Active Problems:   Hypercalcemia   Depression, major, single episode, moderate (HCC)   DKA (diabetic ketoacidosis) ([place])   Acute pain of right shoulder   Left elbow pain   Acute kidney injury ([place])   Weakness   Recurrent falls   Sepsis due to methicillin susceptible Staphylococcus aureus ([place])   Osteomyelitis of left elbow (HCC)   Osteomyelitis of left ulna (HCC)   Sepsis (HCC)   Scheduled Meds: . atorvastatin  40 mg Oral Daily  . calcitonin  4 Units/kg Intramuscular BID  . fenofibrate  160 mg Oral Daily  . insulin aspart  0-9 Units Subcutaneous Q4H  . insulin glargine  5 Units Subcutaneous QHS  . mupirocin ointment  1 application Nasal BID   Continuous Infusions: . lactated ringers 150 mL/hr at 04/04/2020 1026  . nafcillin (NAFCIL) continuous infusion     PRN Meds:.dextrose, metoprolol tartrate, ondansetron **OR** ondansetron (ZOFRAN) IV   SUBJECTIVE: N/a Patient in OR  Review of Systems:  ROS Unable to do as patient in OR  No Known Allergies  OBJECTIVE: Vitals:   03/15/2020 0803 03/21/2020 0818 03/31/2020 0847 04/13/2020 0952  BP:  112/87 127/76   Pulse: (!) 114 (!) 119 (!) 120   Resp:  (!) 21 (!) 25   Temp:  97.9 F (36.6 C) 98.4 F (36.9 C)    TempSrc:  Oral Oral   SpO2:  91% 95%   Weight:    93.9 kg  Height:    5\' 11"  (1.803 m)   Body mass index is 28.87 kg/m.  Physical Exam Not performed today as she is in or  Lab Results Lab Results  Component Value Date   WBC 18.4 (H) 03/22/2020   HGB 9.1 (L) 03/19/2020   HCT 28.7 (L) 03/17/2020   MCV 86.4 04/05/2020   PLT 398 03/15/2020    Lab Results  Component Value Date   CREATININE 1.65 (H) 03/15/2020   BUN 47 (H) 04/05/2020   NA 140 03/30/2020   K 3.6 04/07/2020   CL 110 03/15/2020   CO2 20 (L) 03/21/2020    Lab Results  Component Value Date   ALT 21 04/03/2020   AST 14 (L) 03/30/2020   ALKPHOS 99 03/31/2020   BILITOT 1.7 (H) 03/20/2020     Microbiology: Recent Results (from the past 240 hour(s))  SARS CORONAVIRUS 2 (TAT 6-24 HRS) Nasopharyngeal Nasopharyngeal Swab     Status: None   Collection Time: 04/10/2020  2:13 PM   Specimen: Nasopharyngeal Swab  Result Value Ref Range Status   SARS Coronavirus 2 NEGATIVE NEGATIVE Final    Comment: (NOTE) SARS-CoV-2 target nucleic acids are NOT DETECTED.  The SARS-CoV-2 RNA is generally detectable in upper and lower respiratory specimens during the acute phase of infection. Negative results do not preclude SARS-CoV-2 infection, do not rule out co-infections with other pathogens, and should not be used as the sole basis for treatment or other patient management decisions. Negative results must be combined with clinical observations, patient history, and epidemiological information. The expected result is Negative.  Fact Sheet for Patients: SugarRoll.be  Fact Sheet for Healthcare Providers: https://www.woods-mathews.com/  This test is not yet approved or cleared by the [place] FDA and  has been authorized for detection and/or diagnosis of SARS-CoV-2 by FDA under an Emergency Use Authorization (EUA). This EUA will remain  in effect (meaning this test can be used) for  the duration of the COVID-19 declaration under Se ction 564(b)(1) of the Act, 21 U.S.C. section 360bbb-3(b)(1), unless the authorization is terminated or revoked sooner.  Performed at [facility], [place] [address], [place], [place] [address]   Culture, blood (routine x 2)     Status: Abnormal (Preliminary result)   Collection Time: 03/15/2020  5:08 PM   Specimen: BLOOD  Result Value Ref Range Status   Specimen Description   Final    BLOOD UNKNOWN Performed at [facility] [address], [place], [place] [address]    Special Requests   Final    BOTTLES DRAWN AEROBIC AND ANAEROBIC Blood Culture adequate volume Performed at [facility] [address], [place], [place] [address]    Culture  Setup Time   Final    GRAM POSITIVE COCCI IN BOTH AEROBIC AND ANAEROBIC BOTTLES Organism ID to follow CRITICAL RESULT CALLED TO, READ BACK BY AND VERIFIED WITH: PHARMD C.PIERCE AT 1200 ON 03/22/2020 BY T.SAAD    Culture (A)  Final    STAPHYLOCOCCUS AUREUS  SUSCEPTIBILITIES TO FOLLOW Performed at [facility], [place] [address] [place], [place] [address]    Report Status PENDING  Incomplete  Blood Culture ID Panel (Reflexed)     Status: Abnormal   Collection Time: 03/20/2020  5:08 PM  Result Value Ref Range Status   Enterococcus faecalis NOT DETECTED NOT DETECTED Final   Enterococcus Faecium NOT DETECTED NOT DETECTED Final   Listeria monocytogenes NOT DETECTED NOT DETECTED Final   Staphylococcus species DETECTED (A) NOT DETECTED Final    Comment: CRITICAL RESULT CALLED TO, READ BACK BY AND VERIFIED WITH: PHARMD C.PIERCE AT 1200 ON 03/22/2020 BY T.SAAD    Staphylococcus aureus (BCID) DETECTED (A) NOT DETECTED Final    Comment: CRITICAL RESULT CALLED TO, READ BACK BY AND VERIFIED WITH: PHARMD C.PIERCE AT 1200 ON 03/22/2020 BY T.SAAD    Staphylococcus epidermidis NOT DETECTED NOT DETECTED Final   Staphylococcus lugdunensis NOT DETECTED NOT  DETECTED Final   Streptococcus species NOT DETECTED NOT DETECTED Final   Streptococcus agalactiae NOT DETECTED NOT DETECTED Final   Streptococcus pneumoniae NOT DETECTED NOT DETECTED Final   Streptococcus pyogenes NOT DETECTED NOT DETECTED Final   A.calcoaceticus-baumannii NOT DETECTED NOT DETECTED Final   Bacteroides fragilis NOT DETECTED NOT DETECTED Final   Enterobacterales NOT DETECTED NOT DETECTED Final   Enterobacter cloacae complex NOT DETECTED NOT DETECTED Final   Escherichia coli NOT DETECTED NOT DETECTED Final   Klebsiella aerogenes NOT DETECTED NOT DETECTED Final   Klebsiella oxytoca NOT DETECTED NOT DETECTED Final   Klebsiella pneumoniae NOT DETECTED NOT DETECTED Final   Proteus species NOT DETECTED NOT DETECTED Final   Salmonella species NOT DETECTED NOT DETECTED Final   Serratia marcescens NOT DETECTED NOT DETECTED Final   Haemophilus influenzae NOT DETECTED NOT DETECTED Final   Neisseria meningitidis NOT DETECTED NOT DETECTED Final   Pseudomonas aeruginosa NOT DETECTED NOT DETECTED Final   Stenotrophomonas maltophilia NOT DETECTED NOT DETECTED Final   Candida albicans NOT DETECTED NOT DETECTED Final   Candida auris NOT DETECTED NOT DETECTED Final   Candida glabrata NOT DETECTED NOT DETECTED Final   Candida krusei NOT DETECTED NOT DETECTED Final   Candida parapsilosis NOT DETECTED NOT DETECTED Final   Candida tropicalis NOT DETECTED NOT DETECTED Final   Cryptococcus neoformans/gattii NOT DETECTED NOT DETECTED Final   Meth resistant mecA/C and MREJ NOT DETECTED NOT DETECTED Final    Comment: Performed at [facility], [address] [place], [place] [address]  Culture, Urine     Status: Abnormal   Collection Time: 03/18/2020 11:48 PM   Specimen: Urine, Catheterized  Result Value Ref Range Status   Specimen Description   Final    URINE, CATHETERIZED Performed at [facility] [address], [place], [place] [address]    Special Requests    Final    NONE Performed at [facility], [place] [address], [place], [place] [address]    Culture MULTIPLE SPECIES PRESENT, SUGGEST RECOLLECTION (A)  Final   Report Status 04/02/2020 FINAL  Final  Culture, blood (Routine X 2) w Reflex to ID Panel     Status: None (Preliminary result)   Collection Time: 03/22/20  2:16 PM   Specimen: BLOOD RIGHT HAND  Result Value Ref Range Status   Specimen Description   Final    BLOOD RIGHT HAND Performed at [facility] [address] [place], [place] [address]    Special Requests   Final    BOTTLES DRAWN AEROBIC  AND ANAEROBIC Blood Culture results may not be optimal due to an inadequate volume of blood received in culture bottles Performed at [facility], [place] [address], [place], [place] [address]    Culture  Setup Time   Final    GRAM POSITIVE COCCI IN CLUSTERS IN BOTH AEROBIC AND ANAEROBIC BOTTLES CRITICAL RESULT CALLED TO, READ BACK BY AND VERIFIED WITH: PHARMD J GADHIA [id number] AT 723 AM BY CM Performed at [facility], [place] [address] [place], [place] [address]    Culture [facility] POSITIVE COCCI IN CLUSTERS  Final   Report Status PENDING  Incomplete    Serology: 1/08 hiv screen negative  Micro: 1/09 bcx gpc in clusters 1/08 bcx mssa  Imaging: 1/10 tte in progress  1/09 left elbow mr without contrast 1. Limited, incomplete study. The patient refused further imaging. 2. 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 3. Moderate joint effusion concerning for septic arthritis. 4. Proximal forearm flexor compartment intramuscular fluid collection measuring 1.6 x 4.3 cm with surrounding muscle edema, concerning for abscess. 5. Additional incompletely visualized small fluid collections in the distal upper arm medial to the brachialis muscle and anterior to the biceps myotendinous junction, also concerning for abscesses. 6. High-grade partial tear of the distal triceps  tendon, incompletely evaluated.  1/09 brain mr without contrast No evidence of recent infarction, hemorrhage, or mass.  Chronic hypertensive microhemorrhages.  Minor chronic microvascular ischemic changes.  1/08 cxr No acute pulmonary pathology  Jabier Mutton, [facility] for Infectious [place] [phone] pager    03/15/2020, 10:28 AM

## 2020-03-23 NOTE — Anesthesia Procedure Notes (Signed)

## 2020-03-23 NOTE — Transfer of Care (Signed)
Immediate Anesthesia Transfer of Care Note  Patient: Deborah Figueroa  Procedure(s) Performed: IRRIGATION AND DEBRIDEMENT elbow (Left Elbow)  Patient Location: PACU  Anesthesia Type:General  Level of Consciousness: drowsy and patient cooperative  Airway & Oxygen Therapy: Patient Spontanous Breathing and Patient connected to face mask oxygen  Post-op Assessment: Report given to RN and Post -op Vital signs reviewed and stable  Post vital signs: Reviewed and stable  Last Vitals:  Vitals Value Taken Time  BP 151/97 03/31/2020 1430  Temp    Pulse 112 03/27/2020 1430  Resp 22 04/02/2020 1433  SpO2 76 % 03/17/2020 1430  Vitals shown include unvalidated device data.  Last Pain:  Vitals:   04/11/2020 1147  TempSrc:   PainSc: 0-No pain      Patients Stated Pain Goal: 3 (47/15/95 3967)  Complications: No complications documented.

## 2020-03-23 NOTE — Procedures (Signed)
Echo attempted. Patient in OR. Will attempt again later as time permits.

## 2020-03-23 NOTE — ED Notes (Signed)
R hand PIV removed due to swelling

## 2020-03-23 NOTE — Progress Notes (Signed)
PHARMACY - PHYSICIAN COMMUNICATION CRITICAL VALUE ALERT - BLOOD CULTURE IDENTIFICATION (BCID)  Deborah Figueroa is an 65 y.o. female who presented to [facility] on 04/05/2020 with a chief complaint of  Chief Complaint  Patient presents with  . Hyperglycemia     Assessment:  MSSA bacteremia of unclear etiology.   Name of physician (or Provider) Contacted: Dr. Lonny Prude  Current antibiotics: nafcillin 2 gr IV q4h   Changes to prescribed antibiotics recommended:  Patient is on recommended antibiotics - No changes needed  Results for orders placed or performed during the hospital encounter of 04/02/2020  Blood Culture ID Panel (Reflexed) (Collected: 03/16/2020  5:08 PM)  Result Value Ref Range   Enterococcus faecalis NOT DETECTED NOT DETECTED   Enterococcus Faecium NOT DETECTED NOT DETECTED   Listeria monocytogenes NOT DETECTED NOT DETECTED   Staphylococcus species DETECTED (A) NOT DETECTED   Staphylococcus aureus (BCID) DETECTED (A) NOT DETECTED   Staphylococcus epidermidis NOT DETECTED NOT DETECTED   Staphylococcus lugdunensis NOT DETECTED NOT DETECTED   Streptococcus species NOT DETECTED NOT DETECTED   Streptococcus agalactiae NOT DETECTED NOT DETECTED   Streptococcus pneumoniae NOT DETECTED NOT DETECTED   Streptococcus pyogenes NOT DETECTED NOT DETECTED   A.calcoaceticus-baumannii NOT DETECTED NOT DETECTED   Bacteroides fragilis NOT DETECTED NOT DETECTED   Enterobacterales NOT DETECTED NOT DETECTED   Enterobacter cloacae complex NOT DETECTED NOT DETECTED   Escherichia coli NOT DETECTED NOT DETECTED   Klebsiella aerogenes NOT DETECTED NOT DETECTED   Klebsiella oxytoca NOT DETECTED NOT DETECTED   Klebsiella pneumoniae NOT DETECTED NOT DETECTED   Proteus species NOT DETECTED NOT DETECTED   Salmonella species NOT DETECTED NOT DETECTED   Serratia marcescens NOT DETECTED NOT DETECTED   Haemophilus influenzae NOT DETECTED NOT DETECTED   Neisseria meningitidis NOT DETECTED NOT DETECTED    Pseudomonas aeruginosa NOT DETECTED NOT DETECTED   Stenotrophomonas maltophilia NOT DETECTED NOT DETECTED   Candida albicans NOT DETECTED NOT DETECTED   Candida auris NOT DETECTED NOT DETECTED   Candida glabrata NOT DETECTED NOT DETECTED   Candida krusei NOT DETECTED NOT DETECTED   Candida parapsilosis NOT DETECTED NOT DETECTED   Candida tropicalis NOT DETECTED NOT DETECTED   Cryptococcus neoformans/gattii NOT DETECTED NOT DETECTED   Meth resistant mecA/C and MREJ NOT DETECTED NOT DETECTED      Royetta Asal, PharmD, BCPS 04/09/2020 8:06 AM

## 2020-03-23 NOTE — Progress Notes (Signed)
Spoke to Brien Mates, Therapist, sports. Report received for upcoming surgery.

## 2020-03-23 NOTE — ED Notes (Signed)
Report called to [place], RN on 2W

## 2020-03-23 NOTE — ED Notes (Signed)
Provider made aware of CBG [id number]

## 2020-03-24 ENCOUNTER — Inpatient Hospital Stay (HOSPITAL_COMMUNITY): Payer: Medicaid Other | Admitting: Anesthesiology

## 2020-03-24 ENCOUNTER — Encounter (HOSPITAL_COMMUNITY): Payer: Self-pay | Admitting: Orthopedic Surgery

## 2020-03-24 ENCOUNTER — Inpatient Hospital Stay (HOSPITAL_COMMUNITY): Payer: Medicaid Other

## 2020-03-24 ENCOUNTER — Encounter (HOSPITAL_COMMUNITY): Admission: EM | Disposition: E | Payer: Self-pay | Source: Home / Self Care | Attending: Student

## 2020-03-24 DIAGNOSIS — R7881 Bacteremia: Secondary | ICD-10-CM

## 2020-03-24 DIAGNOSIS — A419 Sepsis, unspecified organism: Secondary | ICD-10-CM

## 2020-03-24 HISTORY — PX: TEE WITHOUT CARDIOVERSION: SHX5443

## 2020-03-24 LAB — ECHOCARDIOGRAM COMPLETE
Area-P 1/2: 8.62 cm2
Height: 71 in
S' Lateral: 1.8 cm
Weight: 3312.01 oz

## 2020-03-24 LAB — BASIC METABOLIC PANEL
Anion gap: 10 (ref 5–15)
Anion gap: 11 (ref 5–15)
Anion gap: 12 (ref 5–15)
BUN: 42 mg/dL — ABNORMAL HIGH (ref 8–23)
BUN: 42 mg/dL — ABNORMAL HIGH (ref 8–23)
BUN: 45 mg/dL — ABNORMAL HIGH (ref 8–23)
CO2: 21 mmol/L — ABNORMAL LOW (ref 22–32)
CO2: 21 mmol/L — ABNORMAL LOW (ref 22–32)
CO2: 22 mmol/L (ref 22–32)
Calcium: 10.9 mg/dL — ABNORMAL HIGH (ref 8.9–10.3)
Calcium: 11.1 mg/dL — ABNORMAL HIGH (ref 8.9–10.3)
Calcium: 12 mg/dL — ABNORMAL HIGH (ref 8.9–10.3)
Chloride: 109 mmol/L (ref 98–111)
Chloride: 111 mmol/L (ref 98–111)
Chloride: 113 mmol/L — ABNORMAL HIGH (ref 98–111)
Creatinine, Ser: 1.45 mg/dL — ABNORMAL HIGH (ref 0.44–1.00)
Creatinine, Ser: 1.75 mg/dL — ABNORMAL HIGH (ref 0.44–1.00)
Creatinine, Ser: 1.77 mg/dL — ABNORMAL HIGH (ref 0.44–1.00)
GFR, Estimated: 32 mL/min — ABNORMAL LOW (ref >60)
GFR, Estimated: 32 mL/min — ABNORMAL LOW (ref >60)
GFR, Estimated: 40 mL/min — ABNORMAL LOW (ref >60)
Glucose, Bld: 264 mg/dL — ABNORMAL HIGH (ref 70–99)
Glucose, Bld: 269 mg/dL — ABNORMAL HIGH (ref 70–99)
Glucose, Bld: 284 mg/dL — ABNORMAL HIGH (ref 70–99)
Potassium: 3.3 mmol/L — ABNORMAL LOW (ref 3.5–5.1)
Potassium: 3.5 mmol/L (ref 3.5–5.1)
Potassium: 3.8 mmol/L (ref 3.5–5.1)
Sodium: 142 mmol/L (ref 135–145)
Sodium: 144 mmol/L (ref 135–145)
Sodium: 144 mmol/L (ref 135–145)

## 2020-03-24 LAB — CBC
HCT: 25.5 % — ABNORMAL LOW (ref 36.0–46.0)
Hemoglobin: 8 g/dL — ABNORMAL LOW (ref 12.0–15.0)
MCH: 27.9 pg (ref 26.0–34.0)
MCHC: 31.4 g/dL (ref 30.0–36.0)
MCV: 88.9 fL (ref 80.0–100.0)
Platelets: 356 10*3/uL (ref 150–400)
RBC: 2.87 MIL/uL — ABNORMAL LOW (ref 3.87–5.11)
RDW: 15.4 % (ref 11.5–15.5)
WBC: 15.1 10*3/uL — ABNORMAL HIGH (ref 4.0–10.5)
nRBC: 0 % (ref 0.0–0.2)

## 2020-03-24 LAB — GLUCOSE, CAPILLARY
Glucose-Capillary: 235 mg/dL — ABNORMAL HIGH (ref 70–99)
Glucose-Capillary: 235 mg/dL — ABNORMAL HIGH (ref 70–99)
Glucose-Capillary: 242 mg/dL — ABNORMAL HIGH (ref 70–99)
Glucose-Capillary: 248 mg/dL — ABNORMAL HIGH (ref 70–99)
Glucose-Capillary: 267 mg/dL — ABNORMAL HIGH (ref 70–99)
Glucose-Capillary: 275 mg/dL — ABNORMAL HIGH (ref 70–99)
Glucose-Capillary: 303 mg/dL — ABNORMAL HIGH (ref 70–99)

## 2020-03-24 LAB — CULTURE, BLOOD (ROUTINE X 2): Special Requests: ADEQUATE

## 2020-03-24 LAB — PROTIME-INR
INR: 1.3 — ABNORMAL HIGH (ref 0.8–1.2)
Prothrombin Time: 15.2 seconds (ref 11.4–15.2)

## 2020-03-24 LAB — MAGNESIUM: Magnesium: 1.5 mg/dL — ABNORMAL LOW (ref 1.7–2.4)

## 2020-03-24 SURGERY — ECHOCARDIOGRAM, TRANSESOPHAGEAL
Anesthesia: Monitor Anesthesia Care

## 2020-03-24 MED ORDER — PROPOFOL 10 MG/ML IV BOLUS
INTRAVENOUS | Status: DC | PRN
  Administered 2020-03-24: 20 mg via INTRAVENOUS

## 2020-03-24 MED ORDER — MELATONIN 3 MG PO TABS
3.0000 mg | ORAL_TABLET | Freq: Every day | ORAL | Status: DC
  Administered 2020-03-24 – 2020-04-11 (×15): 3 mg via ORAL
  Filled 2020-03-24 (×18): qty 1

## 2020-03-24 MED ORDER — LACTATED RINGERS IV SOLN: INTRAVENOUS | Status: DC

## 2020-03-24 MED ORDER — SODIUM CHLORIDE 0.9 % IV SOLN: INTRAVENOUS | Status: DC

## 2020-03-24 MED ORDER — LIDOCAINE 2% (20 MG/ML) 5 ML SYRINGE
INTRAMUSCULAR | Status: DC | PRN
  Administered 2020-03-24: 80 mg via INTRAVENOUS

## 2020-03-24 MED ORDER — OXYCODONE-ACETAMINOPHEN 7.5-325 MG PO TABS
1.0000 | ORAL_TABLET | Freq: Once | ORAL | Status: AC
  Administered 2020-03-24: 1 via ORAL
  Filled 2020-03-24: qty 1

## 2020-03-24 MED ORDER — POTASSIUM CHLORIDE 20 MEQ PO PACK
40.0000 meq | PACK | Freq: Once | ORAL | Status: AC
  Administered 2020-03-24: 40 meq via ORAL
  Filled 2020-03-24 (×3): qty 2

## 2020-03-24 MED ORDER — PROPOFOL 500 MG/50ML IV EMUL
INTRAVENOUS | Status: DC | PRN
  Administered 2020-03-24: 125 ug/kg/min via INTRAVENOUS

## 2020-03-24 NOTE — Anesthesia Procedure Notes (Signed)
Procedure Name: MAC Date/Time: 03/29/2020 2:02 PM Performed by: Amadeo Garnet, CRNA Pre-anesthesia Checklist: Patient identified, Emergency Drugs available, Suction available and Patient being monitored Patient Re-evaluated:Patient Re-evaluated prior to induction Oxygen Delivery Method: Nasal cannula Preoxygenation: Pre-oxygenation with 100% oxygen Induction Type: IV induction Placement Confirmation: positive ETCO2 Dental Injury: Teeth and Oropharynx as per pre-operative assessment

## 2020-03-24 NOTE — Progress Notes (Signed)
[facility]  PROGRESS NOTE  Deborah Figueroa KAJ:[id number] DOB: 1955-05-02 DOA: 03/15/2020 PCP: Ann Held, DO Admit date - 03/27/2020   Admitting Physician Jonnie Finner, DO  Outpatient Primary MD for the patient is Carollee Herter, Alferd Apa, DO  LOS - 3 Brief Narrative   Deborah Figueroa is a 65 y.o. year old female with medical history significant for poorly controlled Type 2 Diabetes, chronic hypercalcemia presumed to be primary hyperparathyroidism, depression, HTN, HLD, recent history of recurrent falls requiring ED visits on 12/18 and 12/19 with no concerning findings on imaging at that time and treated supportively, as well as elevated calcium being evaluated as outpatient with concern for her likely hyperparathyroidism with plan to see surgeon on 1/12 who presented on 1/8 after having another fall in her hotel room and per chart review evaluated by EMS noted to have elevated CBG in the setting of increased thirst, polyuria and recently changed diabetic medications.  Patient is a poor historian as she fixates on her extreme level of thirst and is easily distractible.  Last Ed visit on 03/19/20 with XR of left elbow for evaluation of left elbow pain with recent fall and concerning for irregularity of the olecranon with possibility of underlying osteomyelitis and recommended MRI if clinically indicated. Lumbar spine XR, Right shoulder XR, and CT head at that time was negative for any acute findings except for periorbital hematoma  In the ED T-max 98.5, heart rate range 110-125, respiratory rate 24-30, initial blood pressure 140/84.  Glucose 650, CO2 13, calcium 13.3, anion gap 19, sodium 132.  UA unremarkable, beta hydroxybutyrate 6.21, pH 7.2 on VBG with CO2 35.7, COVID test negative, WBC 18, hemoglobin 7.7 (previous baseline 9.5 on 12/20).  Chest x-ray was nonacute.  Patient was started on IV insulin drip for management of DKA and type II diabetic as well as IV fluids for  hypercalcemia of unclear etiology.  Hospital course complicated by blood cultures obtained on admission positive for GPC in clusters in the blood culture ID detecting MSSA and patient started on cefazolin, as well as worsening calcium greater than 15 for which patient was increased on IV fluids, given calcitonin and zoledronic acid.   Subjective  More alert today. Asking for ice chips. No current pain. Knows she's in hospital A & P  Sepsis secondary to MSSA bacteremia with high concern for endocarditis and septic phenomenon including chronic left Olecranon bursitis with osteomyelitis, with associated phlegmon  and small abscesses in the lower biceps muscle, improving.   MRI of left elbow concerning for osteomyelitis and septic arthritis limited by motion but CT negative for septic arthritis.  MRI brain negative for CNS involvement. No longer having fevers, tachycardia more likely from dehydration  - Status post I&D of elbow by Ortho on 03/20/2020, surgical culture with GPC so far - Tiny fluid collections consistent with small abscesses in the musculature too small to reliably surgically debride, if increase in size may warrant ultrasound-guided drainage - Return to IV cefazolin and discontinue nafcillin given no concern for CNS involvement --MRI of left elbow given bacteremia and stability from DKA now -Plan for TEE on 03/27/2020 --Appreciate ID recommendation,  - Follow repeat blood cultures for clearance  Severe hypercalcemia secondary to primary hyperparathyroidism, symptomatic.  At baseline has elevated calcium of 12 and per outpatient  Endocrinology notes met criteria for surgery but has not had yet. Ca > 15 on admission, at 12 s/p Bisphosphonate, calcitonin and IVF.    PTH  elevated significantly, Vit D wnl. She is alert and oriented x4 but does have some distal weakness in upper extremities. She is easily distractible but not as confused today - Continue LR infusion at 100 cc an hour reassess in  24 hours - Completed 4 doses of calcitonin - Status post zoledronic bisphosphonate on, 1/9 - Discussed with on call ENT, currently wouldn't get surgery due to ongoing bacteremia, advise calling back later this week for formal consult if calcium continues to worsen and bacteria clears.   Weakness with recurrent falls.  Likely secondary to ongoing hypercalcemia further exacerbated by MSSA bacteremia and presentation of DKA.  MRI brain negative for any acute CNS involvement  She is oriented x4 much more with it today.  -- Continue antibiotics and IV fluids --PT eval  Type 2 diabetes with DKA.   DKA now resolved History of poor control and poor adherence to regimen.  A1c 10.4 On admission elevated butyrate acid, anion gap of 19, CBGs in 600 on admission, likely exacerbated in setting of worsening hypercalcemia and bacteremia of unclear etiology.  Anion gap is now closed.  Home regimen includes glipizide, Lantus 10 units - Patient not eating much and currently n.p.o. for midnight for TEE procedure, continue reduced Lantus at 5 units - Continue to closely monitor CBGs,   AKI on CKD, back at baseline Likely prerenal in the setting of dehydration from DKA as well as some insult from sepsis physiology.  Further complicated by hypercalcemia.  Baseline creatinine from 09/2019 was 1.88, was 2.23 on admission, improved with IV fluids to 1.65 - Avoid nephrotoxins - Continue normal saline and closely monitor output - BMP daily  --holding home lisinopril/HCTZ  Acute on chronic anemia, normocytic.  Baseline hemoglobin 9.5 on 02/14/2020).  On admission hemoglobin was 7.7, on repeat 8.9.  No reported bleeding episodes.  Could be acutely worse in the setting of illness - Continue closely monitor - Trend CBC, transfuse for hemoglobin less than 7 - Check iron panel, Q68, folic acid  Suppressed TSH,  likely euthyroid sick syndrome given free T4 within normal limits.  Likely in the setting of acute  illness  Hypertension.   Initially hypertensive on admission however given ongoing sepsis physiology will hold off on home blood pressure medications - We will hold home lisinopril/HCTZ, Toprol --closely monitor BP  Depression, stable -continue home Zoloft     Family Communication  : Updated son on phone on 1/10  Code Status :  FULL  Disposition Plan  :  Patient is from home. Anticipated d/c date: > 3 days. Barriers to d/c or necessity for inpatient status: IV cefazolin for bacteremia, IVF and hypercalcemia, needs close monitoring of kidney function, needs TEE, monitoring cultures Consults  :  Infectious Disease, orthopedics  Procedures  :  Will need TEE Status post I&D of left elbow on 03/15/2018   DVT Prophylaxis  :  SCDs, while evaluating anemia MDM: The below labs and imaging reports were reviewed and summarized above.  Medication management as above.  Lab Results  Component Value Date   PLT 356 03/20/2020    Diet :  Diet Order            Diet NPO time specified  Diet effective midnight                  Inpatient Medications Scheduled Meds: . atorvastatin  40 mg Oral Daily  . docusate sodium  100 mg Oral BID  . fenofibrate  160 mg Oral  Daily  . insulin aspart  0-9 Units Subcutaneous Q4H  . insulin glargine  5 Units Subcutaneous QHS  . mupirocin ointment  1 application Nasal BID   Continuous Infusions: .  ceFAZolin (ANCEF) IV 2 g (03/29/2020 0528)  . lactated ringers 150 mL/hr at 04/09/2020 1007   PRN Meds:.dextrose, metoCLOPramide **OR** metoCLOPramide (REGLAN) injection, metoprolol tartrate, ondansetron **OR** ondansetron (ZOFRAN) IV  Antibiotics  :   Anti-infectives (From admission, onward)   Start     Dose/Rate Route Frequency Ordered Stop   04/05/2020 1145  ceFAZolin (ANCEF) IVPB 2g/100 mL premix        2 g 200 mL/hr over 30 Minutes Intravenous Every 8 hours 03/18/2020 1055     03/30/2020 1100  nafcillin 12 g in sodium chloride 0.9 % 500 mL continuous  infusion  Status:  Discontinued        12 g 20.8 mL/hr over 24 Hours Intravenous Every 24 hours 04/12/2020 0949 03/20/2020 1055   03/22/20 1500  nafcillin 2 g in sodium chloride 0.9 % 100 mL IVPB  Status:  Discontinued        2 g 200 mL/hr over 30 Minutes Intravenous Every 4 hours 03/22/20 1354 03/14/2020 0949   03/22/20 1345  nafcillin injection 2 g  Status:  Discontinued        2 g Intravenous Every 4 hours 03/22/20 1344 03/22/20 1354   03/22/20 1300  ceFAZolin (ANCEF) IVPB 2g/100 mL premix  Status:  Discontinued        2 g 200 mL/hr over 30 Minutes Intravenous Every 8 hours 03/22/20 1257 03/22/20 1344       Objective   Vitals:   03/27/2020 1550 03/27/2020 1745 03/22/2020 2212 04/07/2020 0543  BP: 129/79 116/64 (!) 147/76 139/78  Pulse: (!) 116 (!) 111 (!) 117 (!) 127  Resp: 14 (!) '24 18 20  ' Temp: 98.3 F (36.8 C) 98.8 F (37.1 C) 97.7 F (36.5 C) 98.1 F (36.7 C)  TempSrc: Oral Oral Oral   SpO2: 97% 96% 97% 95%  Weight:      Height:        SpO2: 95 % O2 Flow Rate (L/min): 2 L/min  Wt Readings from Last 3 Encounters:  03/16/2020 93.9 kg  03/19/20 93.9 kg  02/29/20 89.8 kg     Intake/Output Summary (Last 24 hours) at 04/11/2020 1023 Last data filed at 04/12/2020 0544 Gross per 24 hour  Intake 3738.97 ml  Output 560 ml  Net 3178.97 ml    Physical Exam:  Oriented to self, place, time, context, much more alert this morning Moves lower extremities with no deficits Distal weakness in bilateral hands with weak hand grip  Left hand worse than right No dysarthria or facial droop Following commands Very dry oral mucosa, peeling lips Normal respiratory effort on room air, CTAB Tachycardic,No Gallops,Rubs or new Murmurs,  +ve B.Sounds, Abd Soft, No tenderness, No rebound, guarding or rigidity. Left elbow with circular area of erythema near olecranon and some very mild tenderness with no appreciated fluctuance or induration, dressing in place Significant Bruising of right  periorbital area   I have personally reviewed the following:   Data Reviewed:  CBC Recent Labs  Lab 04/07/2020 1247 03/22/20 0924 03/20/2020 0315 03/17/2020 0952 03/22/2020 0537  WBC 18.0* 18.9* 19.5* 18.4* 15.1*  HGB 7.7* 8.9* 8.7* 9.1* 8.0*  HCT 24.7* 27.6* 27.3* 28.7* 25.5*  PLT 469* 457* 409* 398 356  MCV 91.8 86.5 86.9 86.4 88.9  MCH 28.6 27.9 27.7 27.4 27.9  MCHC 31.2 32.2 31.9 31.7 31.4  RDW 15.6* 15.1 15.5 15.2 15.4  LYMPHSABS  --   --   --  0.3*  --   MONOABS  --   --   --  0.3  --   EOSABS  --   --   --  0.0  --   BASOSABS  --   --   --  0.0  --     Chemistries  Recent Labs  Lab 03/19/20 1047 04/03/2020 0138 03/22/20 1954 03/22/2020 0115 03/22/2020 0952 03/20/2020 0000 04/12/2020 0537  NA 127*   < > 139 136 140 142 144  K 4.5   < > 4.0 4.3 3.6 3.8 3.5  CL 92*   < > 109 106 110 109 113*  CO2 21   < > 19* 18* 20* 21* 21*  GLUCOSE 589*   < > 336* 506* 408* 284* 264*  BUN 73*   < > 51* 50* 47* 45* 42*  CREATININE 2.17*   < > 1.61* 1.62* 1.65* 1.77* 1.75*  CALCIUM 13.9*   < > 13.2* 12.7* 12.6* 12.0* 11.1*  AST 10  --   --   --  14*  --   --   ALT 18  --   --   --  21  --   --   ALKPHOS 125*  --   --   --  99  --   --   BILITOT 0.6  --   --   --  1.7*  --   --    < > = values in this interval not displayed.   ------------------------------------------------------------------------------------------------------------------ No results for input(s): CHOL, HDL, LDLCALC, TRIG, CHOLHDL, LDLDIRECT in the last 72 hours.  Lab Results  Component Value Date   HGBA1C 10.4 (H) 03/19/2020   ------------------------------------------------------------------------------------------------------------------ No results for input(s): TSH, T4TOTAL, T3FREE, THYROIDAB in the last 72 hours.  Invalid input(s): FREET3 ------------------------------------------------------------------------------------------------------------------ No results for input(s): VITAMINB12, FOLATE, FERRITIN, TIBC,  IRON, RETICCTPCT in the last 72 hours.  Coagulation profile No results for input(s): INR, PROTIME in the last 168 hours.  No results for input(s): DDIMER in the last 72 hours.  Cardiac Enzymes No results for input(s): CKMB, TROPONINI, MYOGLOBIN in the last 168 hours.  Invalid input(s): CK ------------------------------------------------------------------------------------------------------------------    Component Value Date/Time   BNP 47.8 02/14/2019 2147    Micro Results Recent Results (from the past 240 hour(s))  SARS CORONAVIRUS 2 (TAT 6-24 HRS) Nasopharyngeal Nasopharyngeal Swab     Status: None   Collection Time: 04/13/2020  2:13 PM   Specimen: Nasopharyngeal Swab  Result Value Ref Range Status   SARS Coronavirus 2 NEGATIVE NEGATIVE Final    Comment: (NOTE) SARS-CoV-2 target nucleic acids are NOT DETECTED.  The SARS-CoV-2 RNA is generally detectable in upper and lower respiratory specimens during the acute phase of infection. Negative results do not preclude SARS-CoV-2 infection, do not rule out co-infections with other pathogens, and should not be used as the sole basis for treatment or other patient management decisions. Negative results must be combined with clinical observations, patient history, and epidemiological information. The expected result is Negative.  Fact Sheet for Patients: SugarRoll.be  Fact Sheet for Healthcare Providers: https://www.woods-mathews.com/  This test is not yet approved or cleared by the [place] FDA and  has been authorized for detection and/or diagnosis of SARS-CoV-2 by FDA under an Emergency Use Authorization (EUA). This EUA will remain  in effect (meaning this test can be used) for the  duration of the COVID-19 declaration under Se ction 564(b)(1) of the Act, 21 U.S.C. section 360bbb-3(b)(1), unless the authorization is terminated or revoked sooner.  Performed at [facility], [place] [address] [place], [place] [address]   Culture, blood (routine x 2)     Status: Abnormal   Collection Time: 04/04/2020  5:08 PM   Specimen: BLOOD  Result Value Ref Range Status   Specimen Description   Final    BLOOD UNKNOWN Performed at [facility] [address], [place], [place] [address]    Special Requests   Final    BOTTLES DRAWN AEROBIC AND ANAEROBIC Blood Culture adequate volume Performed at [facility] [address], [place], [place] [address]    Culture  Setup Time   Final    GRAM POSITIVE COCCI IN BOTH AEROBIC AND ANAEROBIC BOTTLES CRITICAL RESULT CALLED TO, READ BACK BY AND VERIFIED WITH: PHARMD C.PIERCE AT 1200 ON 03/22/2020 BY T.SAAD Performed at [facility], [place] [address] [place], [place] [address]    Culture STAPHYLOCOCCUS AUREUS (A)  Final   Report Status 03/19/2020 FINAL  Final   Organism ID, Bacteria STAPHYLOCOCCUS AUREUS  Final      Susceptibility   Staphylococcus aureus - MIC*    CIPROFLOXACIN <=0.5 SENSITIVE Sensitive     ERYTHROMYCIN <=0.25 SENSITIVE Sensitive     GENTAMICIN <=0.5 SENSITIVE Sensitive     OXACILLIN 0.5 SENSITIVE Sensitive     TETRACYCLINE <=1 SENSITIVE Sensitive     VANCOMYCIN 1 SENSITIVE Sensitive     TRIMETH/SULFA <=10 SENSITIVE Sensitive     CLINDAMYCIN <=0.25 SENSITIVE Sensitive     RIFAMPIN <=0.5 SENSITIVE Sensitive     Inducible Clindamycin NEGATIVE Sensitive     * STAPHYLOCOCCUS AUREUS  Blood Culture ID Panel (Reflexed)     Status: Abnormal   Collection Time: 03/22/2020  5:08 PM  Result Value Ref Range Status   Enterococcus faecalis NOT DETECTED NOT DETECTED Final   Enterococcus Faecium NOT DETECTED NOT DETECTED Final   Listeria monocytogenes NOT DETECTED NOT DETECTED Final   Staphylococcus species DETECTED (A) NOT DETECTED Final    Comment: CRITICAL RESULT CALLED TO, READ BACK BY AND VERIFIED WITH: PHARMD C.PIERCE AT 1200 ON 03/22/2020 BY T.SAAD    Staphylococcus aureus  (BCID) DETECTED (A) NOT DETECTED Final    Comment: CRITICAL RESULT CALLED TO, READ BACK BY AND VERIFIED WITH: PHARMD C.PIERCE AT 1200 ON 03/22/2020 BY T.SAAD    Staphylococcus epidermidis NOT DETECTED NOT DETECTED Final   Staphylococcus lugdunensis NOT DETECTED NOT DETECTED Final   Streptococcus species NOT DETECTED NOT DETECTED Final   Streptococcus agalactiae NOT DETECTED NOT DETECTED Final   Streptococcus pneumoniae NOT DETECTED NOT DETECTED Final   Streptococcus pyogenes NOT DETECTED NOT DETECTED Final   A.calcoaceticus-baumannii NOT DETECTED NOT DETECTED Final   Bacteroides fragilis NOT DETECTED NOT DETECTED Final   Enterobacterales NOT DETECTED NOT DETECTED Final   Enterobacter cloacae complex NOT DETECTED NOT DETECTED Final   Escherichia coli NOT DETECTED NOT DETECTED Final   Klebsiella aerogenes NOT DETECTED NOT DETECTED Final   Klebsiella oxytoca NOT DETECTED NOT DETECTED Final   Klebsiella pneumoniae NOT DETECTED NOT DETECTED Final   Proteus species NOT DETECTED NOT DETECTED Final   Salmonella species NOT DETECTED NOT DETECTED Final   Serratia marcescens NOT DETECTED NOT DETECTED Final   Haemophilus influenzae NOT DETECTED NOT DETECTED Final   Neisseria meningitidis NOT DETECTED NOT DETECTED Final   Pseudomonas aeruginosa NOT DETECTED  NOT DETECTED Final   Stenotrophomonas maltophilia NOT DETECTED NOT DETECTED Final   Candida albicans NOT DETECTED NOT DETECTED Final   Candida auris NOT DETECTED NOT DETECTED Final   Candida glabrata NOT DETECTED NOT DETECTED Final   Candida krusei NOT DETECTED NOT DETECTED Final   Candida parapsilosis NOT DETECTED NOT DETECTED Final   Candida tropicalis NOT DETECTED NOT DETECTED Final   Cryptococcus neoformans/gattii NOT DETECTED NOT DETECTED Final   Meth resistant mecA/C and MREJ NOT DETECTED NOT DETECTED Final    Comment: Performed at [facility], [place] [address], [place], [place] [address]  Culture, Urine     Status: Abnormal    Collection Time: 04/02/2020 11:48 PM   Specimen: Urine, Catheterized  Result Value Ref Range Status   Specimen Description   Final    URINE, CATHETERIZED Performed at [facility] [address] [place], [place] [address]    Special Requests   Final    NONE Performed at [facility], [place] [address], [place], [place] [address]    Culture MULTIPLE SPECIES PRESENT, SUGGEST RECOLLECTION (A)  Final   Report Status 04/13/2020 FINAL  Final  Culture, blood (Routine X 2) w Reflex to ID Panel     Status: Abnormal (Preliminary result)   Collection Time: 03/22/20  2:16 PM   Specimen: BLOOD RIGHT HAND  Result Value Ref Range Status   Specimen Description   Final    BLOOD RIGHT HAND Performed at [facility] [address], [place], [place] [address]    Special Requests   Final    BOTTLES DRAWN AEROBIC AND ANAEROBIC Blood Culture results may not be optimal due to an inadequate volume of blood received in culture bottles Performed at [facility] [address], [place], [place] [address]    Culture  Setup Time   Final    GRAM POSITIVE COCCI IN CLUSTERS IN BOTH AEROBIC AND ANAEROBIC BOTTLES CRITICAL RESULT CALLED TO, READ BACK BY AND VERIFIED WITH: PHARMD J GADHIA [id number] AT 723 AM BY CM CRITICAL VALUE NOTED.  VALUE IS CONSISTENT WITH PREVIOUSLY REPORTED AND CALLED VALUE.    Culture (A)  Final    STAPHYLOCOCCUS AUREUS SUSCEPTIBILITIES PERFORMED ON PREVIOUS CULTURE WITHIN THE LAST 5 DAYS. Performed at [facility], [place] [address] [place], [place] [address]    Report Status PENDING  Incomplete  Surgical PCR screen     Status: Abnormal   Collection Time: 03/15/2020 10:08 AM   Specimen: Nasal Mucosa; Nasal Swab  Result Value Ref Range Status   MRSA, PCR NEGATIVE NEGATIVE Final   Staphylococcus aureus POSITIVE (A) NEGATIVE Final    Comment: (NOTE) The Xpert SA Assay (FDA approved for NASAL specimens in patients  84 years of age and older), is one component of a comprehensive surveillance program. It is not intended to diagnose infection nor to guide or monitor treatment. Performed at [facility], [place] [address], [place], [place] [address]   Aerobic Culture (superficial specimen)     Status: None (Preliminary result)   Collection Time: 04/03/2020  1:42 PM   Specimen: PATH Other  Result Value Ref Range Status   Specimen Description   Final    FLUID  LEFT OLECRANON Performed at [facility] [address] [place], [place] [address]    Special Requests   Final    NONE Performed at [facility],  [address] [place], [place] [address]  Gram Stain   Final    NO WBC SEEN RARE GRAM POSITIVE COCCI Performed at [facility], [place] [address], [place], [place] [address]    Culture PENDING  Incomplete   Report Status PENDING  Incomplete  Aerobic/Anaerobic Culture (surgical/deep wound)     Status: None (Preliminary result)   Collection Time: 03/28/2020  1:46 PM   Specimen: PATH Soft tissue  Result Value Ref Range Status   Specimen Description   Final    TISSUE LEFT OLECRANON INFECTED Performed at [facility] [address] [place], [place] [address]    Special Requests   Final    NONE Performed at [facility], [place] [address], [place], [place] [address]    Gram Stain   Final    RARE WBC PRESENT, PREDOMINANTLY PMN RARE GRAM POSITIVE COCCI    Culture   Final    TOO YOUNG TO READ Performed at [facility], [place] [address] [place], [place] [address]    Report Status PENDING  Incomplete    Radiology Reports DG Lumbar Spine Complete  Result Date: 03/19/2020 CLINICAL DATA:  History of multiple falls with low back pain, initial encounter EXAM: LUMBAR SPINE - COMPLETE 4+ VIEW COMPARISON:  None. FINDINGS: Six non rib-bearing lumbar type vertebral bodies are visualized. Changes of prior  fusion at L5-6 is noted. Mild osteophytic changes are seen. Facet hypertrophic changes are noted. No soft tissue abnormality is seen. IMPRESSION: Degenerative and postoperative changes without acute abnormality. Electronically Signed   By: Inez Catalina M.D.   On: 03/19/2020 12:29   DG Shoulder Right  Result Date: 03/19/2020 CLINICAL DATA:  Multiple recent falls with right shoulder pain, initial encounter EXAM: RIGHT SHOULDER - 2+ VIEW COMPARISON:  None. FINDINGS: No acute fracture or dislocation is noted. No soft tissue abnormality is seen. Old healed right rib fractures. IMPRESSION: No acute abnormality noted. Electronically Signed   By: Inez Catalina M.D.   On: 03/19/2020 12:21   DG Elbow Complete Left  Result Date: 03/19/2020 CLINICAL DATA:  History of multiple falls with left elbow pain, initial encounter EXAM: LEFT ELBOW - COMPLETE 3+ VIEW COMPARISON:  None. FINDINGS: No acute fracture or dislocation is noted. Considerable soft tissue swelling is noted over the olecranon with scattered soft tissue calcifications likely related to olecranon bursitis. The posterior aspect of the olecranon appears somewhat eroded. Possibility of osteomyelitis would deserve consideration IMPRESSION: Changes most consistent with olecranon bursitis. Some irregularity of the olecranon on the lateral film is noted. Possibility of underlying osteomyelitis deserves consideration. MRI may be helpful as clinically indicated. Electronically Signed   By: Inez Catalina M.D.   On: 03/19/2020 12:24   CT Head Wo Contrast  Result Date: 03/19/2020 CLINICAL DATA:  Head trauma, initial encounter. Facial trauma; head trauma, minor, normal mental status. Additional history provided: Patient reports fall 2 weeks ago at work hitting back of head, fall 2 days ago on sidewalk hitting right forehead, swelling/contusion to right eye. EXAM: CT HEAD WITHOUT CONTRAST TECHNIQUE: Contiguous axial images were obtained from the base of the skull through the  vertex without intravenous contrast. COMPARISON:  Head CT 02/09/2019. FINDINGS: Brain: Cerebral volume is normal for age. There is no acute intracranial hemorrhage. No demarcated cortical infarct. No extra-axial fluid collection. No evidence of intracranial mass. No midline shift. Vascular: No hyperdense vessel. Skull: Normal. Negative for fracture or focal lesion. Sinuses/Orbits: Visualized orbits show no acute finding. No significant paranasal sinus disease at  the imaged levels. Other: Partially imaged forehead/right periorbital hematoma. IMPRESSION: Unremarkable non-contrast CT appearance of the brain for age. No evidence of acute intracranial abnormality. Partially imaged forehead/right periorbital hematoma. Electronically Signed   By: Kellie Simmering DO   On: 03/19/2020 11:48   CT HUMERUS LEFT W CONTRAST  Result Date: 04/03/2020 CLINICAL DATA:  Evaluate olecranon osteomyelitis and abscesses. EXAM: CT OF THE UPPER LEFT EXTREMITY WITH CONTRAST TECHNIQUE: Multidetector CT imaging of the upper left extremity was performed according to the standard protocol following intravenous contrast administration. CONTRAST:  36m OMNIPAQUE IOHEXOL 300 MG/ML  SOLN COMPARISON:  Limited MRI examination from 03/22/2020 FINDINGS: There are destructive bony changes involving the olecranon consistent with osteomyelitis. Adjacent inflammatory phlegmon and abscess noted in the region of the olecranon bursa likely septic bursitis. As seen on the MRI there is also a small fluid collection just anterior to the lower biceps muscle. This measured approximately 17 x 10 mm on the MRI and measures approximately 13 x 9 mm on the CT scan. There is also evidence of pyomyositis most likely in the flexor digitorum superficialis muscle of the proximal forearm. The larger abscess measures 15 x 14 mm and the smaller adjacent abscess measures 8 mm. Surrounding myositis and cellulitis is noted. I do not see any findings suspicious for septic arthritis  at the elbow joint. IMPRESSION: 1. Osteomyelitis involving the olecranon with adjacent inflammatory phlegmon and abscess in the region of the olecranon bursa. 2. Evidence of pyomyositis most likely in the flexor digitorum superficialis muscle of the proximal forearm. 3. Small abscess along the ventral aspect of the lower biceps muscle. 4. No CT findings suspicious for septic arthritis at the elbow joint. Electronically Signed   By: PMarijo SanesM.D.   On: 04/05/2020 13:32   MR BRAIN WO CONTRAST  Result Date: 03/22/2020 CLINICAL DATA:  Mental status change, multiple falls, right arm numbness EXAM: MRI HEAD WITHOUT CONTRAST TECHNIQUE: Multiplanar, multiecho pulse sequences of the brain and surrounding structures were obtained without intravenous contrast. COMPARISON:  None. FINDINGS: Brain: There is no acute infarction or intracranial hemorrhage. There is no intracranial mass, mass effect, or edema. There is no hydrocephalus or extra-axial fluid collection. Multiple small foci of susceptibility hypointensity with involvement of the basal ganglia, thalamus, and pons consistent with chronic hypertensive microhemorrhages. Few scattered small foci of T2 hyperintensity in the supratentorial white matter are nonspecific but may reflect minor chronic microvascular ischemic changes. Ventricles and sulci are within normal limits in size and configuration. Vascular: Major vessel flow voids at the skull base are preserved. Skull and upper cervical spine: Marrow signal within normal limits. Sinuses/Orbits: Paranasal sinuses are aerated. Orbits are unremarkable. Other: Right frontal scalp hematoma. Sella is unremarkable. Mastoid air cells are clear. IMPRESSION: No evidence of recent infarction, hemorrhage, or mass. Chronic hypertensive microhemorrhages. Minor chronic microvascular ischemic changes. Electronically Signed   By: PMacy MisM.D.   On: 03/22/2020 16:36   CT FOREARM LEFT W CONTRAST  Result Date:  04/01/2020 CLINICAL DATA:  Evaluate olecranon osteomyelitis and abscesses. EXAM: CT OF THE UPPER LEFT EXTREMITY WITH CONTRAST TECHNIQUE: Multidetector CT imaging of the upper left extremity was performed according to the standard protocol following intravenous contrast administration. CONTRAST:  820mOMNIPAQUE IOHEXOL 300 MG/ML  SOLN COMPARISON:  Limited MRI examination from 03/22/2020 FINDINGS: There are destructive bony changes involving the olecranon consistent with osteomyelitis. Adjacent inflammatory phlegmon and abscess noted in the region of the olecranon bursa likely septic bursitis. As seen on the MRI  there is also a small fluid collection just anterior to the lower biceps muscle. This measured approximately 17 x 10 mm on the MRI and measures approximately 13 x 9 mm on the CT scan. There is also evidence of pyomyositis most likely in the flexor digitorum superficialis muscle of the proximal forearm. The larger abscess measures 15 x 14 mm and the smaller adjacent abscess measures 8 mm. Surrounding myositis and cellulitis is noted. I do not see any findings suspicious for septic arthritis at the elbow joint. IMPRESSION: 1. Osteomyelitis involving the olecranon with adjacent inflammatory phlegmon and abscess in the region of the olecranon bursa. 2. Evidence of pyomyositis most likely in the flexor digitorum superficialis muscle of the proximal forearm. 3. Small abscess along the ventral aspect of the lower biceps muscle. 4. No CT findings suspicious for septic arthritis at the elbow joint. Electronically Signed   By: Marijo Sanes M.D.   On: 04/03/2020 13:32   US RENAL  Result Date: 03/19/2020 CLINICAL DATA:  Acute renal insufficiency. EXAM: RENAL / URINARY TRACT ULTRASOUND COMPLETE COMPARISON:  None. FINDINGS: Right Kidney: Renal measurements: 10.6 x 4.4 x 4.8 cm = volume: 117.1 mL. No significant hydronephrosis. Left Kidney: Renal measurements: 10.7 x 5.2 x 4.7 cm = volume: 137 mL. Contains a 1.4 cm cyst.  Bladder: Appears normal for degree of bladder distention. Other: Cholelithiasis. IMPRESSION: 1. No cause for acute renal insufficiency. 2. Cholelithiasis. Electronically Signed   By: Dorise Bullion III M.D   On: 04/08/2020 19:00   MR ELBOW LEFT WO CONTRAST  Result Date: 03/22/2020 CLINICAL DATA:  Left elbow pain.  MSSA bacteremia. EXAM: MRI OF THE LEFT ELBOW WITHOUT CONTRAST TECHNIQUE: Multiplanar, multisequence MR imaging of the elbow was performed. No intravenous contrast was administered. COMPARISON:  Left elbow x-rays dated March 19, 2020. FINDINGS: Incomplete study. The patient refused further imaging. Sagittal sequence is nondiagnostic. No coronal sequences were obtained. TENDONS Common forearm flexor origin: Intact. Common forearm extensor origin: Intact. Biceps: Intact. Triceps: High-grade partial tear at the insertion, incompletely evaluated. LIGAMENTS Medial stabilizers: Limited evaluation.  Grossly intact. Lateral stabilizers:  Limited evaluation.  Grossly intact. Cartilage: Not well evaluated by the axial sequences. Joint: Moderate joint effusion. Cubital tunnel: Increased T2 signal of the ulnar nerve within the cubital tunnel, likely reactive. Bones: Patchy marrow edema and heterogeneously decreased T1 marrow signal involving the olecranon and proximal ulna. Other: Proximal forearm flexor compartment intramuscular fluid collection measuring 1.6 x 4.3 cm (series 9, images 1-10), with surrounding muscle edema. Additional incompletely visualized ill-defined fluid collection in the ulnar aspect of the distal upper arm medial to the brachialis muscle measuring 3.1 x 2.0 cm (series 9, image 30). Incompletely visualized 1.0 x 1.8 cm fluid collection in the distal upper arm anterior to the biceps myotendinous junction (series 9, image 30). IMPRESSION: 1. Limited, incomplete study. The patient refused further imaging. 2. 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 3. Moderate joint effusion concerning  for septic arthritis. 4. Proximal forearm flexor compartment intramuscular fluid collection measuring 1.6 x 4.3 cm with surrounding muscle edema, concerning for abscess. 5. Additional incompletely visualized small fluid collections in the distal upper arm medial to the brachialis muscle and anterior to the biceps myotendinous junction, also concerning for abscesses. 6. High-grade partial tear of the distal triceps tendon, incompletely evaluated. Electronically Signed   By: Titus Dubin M.D.   On: 03/22/2020 17:40   DG CHEST PORT 1 VIEW  Result Date: 03/18/2020 CLINICAL DATA:  Leukocytosis EXAM: PORTABLE CHEST  1 VIEW COMPARISON:  February 15, 2019 FINDINGS: The cardiomediastinal silhouette is unchanged in contour.Atherosclerotic calcifications of the aorta. Low lung volumes with bronchovascular crowding. No pleural effusion. No pneumothorax. No acute pleuroparenchymal abnormality. Visualized abdomen is unremarkable. Mild degenerative changes of the thoracic spine. IMPRESSION: No acute cardiopulmonary abnormality. Electronically Signed   By: Valentino Saxon MD   On: 03/27/2020 17:09     Time Spent in minutes  30     Desiree Hane M.D on 03/29/2020 at 10:23 AM  To page go to [email or web address] - password [facility]

## 2020-03-24 NOTE — Progress Notes (Signed)
Inpatient Diabetes Program Recommendations  AACE/ADA: New Consensus Statement on Inpatient Glycemic Control (2015)  Target Ranges:  Prepandial:   less than 140 mg/dL      Peak postprandial:   less than 180 mg/dL (1-2 hours)      Critically ill patients:  140 - 180 mg/dL   Lab Results  Component Value Date   GLUCAP 242 (H) 03/30/2020   HGBA1C 10.4 (H) 03/19/2020    Review of Glycemic Control  Diabetes history: DM2 Outpatient Diabetes medications: glipizide 2.5 mg QAM, Lantus 10 units QD (not taking) Current orders for Inpatient glycemic control: lantus 5 units QHS, Novolog 0-9 units Q4H  HgbA1C - 10.4% 304, 273 mg/dL FBS 324   Inpatient Diabetes Program Recommendations:     Increase Lantus to 10 units QHS Increase Novolog to 0-15 units Q4H  Will speak with pt about why she's not taking Lantus at home and HgbA1C of 10.4%.  Continue to follow.  Thank you. Lorenda Peck, RD, LDN, CDE Inpatient Diabetes Coordinator [phone]

## 2020-03-24 NOTE — Progress Notes (Signed)
PT Cancellation Note  Patient Details Name: Deborah Figueroa MRN: [id number] DOB: 1955/05/17   Cancelled Treatment:    Reason Eval/Treat Not Completed: Patient at procedure or test/unavailable (TEE)   Oriel Rumbold,KATHrine E 03/16/2020, 1:14 PM Arlyce Dice, DPT Acute Rehabilitation Services Pager: [phone] Office: [phone]

## 2020-03-24 NOTE — Progress Notes (Signed)
   PATIENT ID: Deborah Figueroa   1 Day Post-Op Procedure(s) (LRB): IRRIGATION AND DEBRIDEMENT elbow (Left)  Subjective: Patient denies left elbow pain, reports she is thristy for water or ice chips. Reports the ace wrap is comfortable, no concerns about left elbow.  Objective:  Vitals:   04/12/2020 2212 04/08/2020 0543  BP: (!) 147/76 139/78  Pulse: (!) 117 (!) 127  Resp: 18 20  Temp: 97.7 F (36.5 C) 98.1 F (36.7 C)  SpO2: 97% 95%     L elbow dressing c/d/i Wiggles fingers, distally nvi  Labs:  Recent Labs    03/25/2020 1247 03/22/20 0924 04/03/2020 0315 04/10/2020 0952 04/10/2020 0537  HGB 7.7* 8.9* 8.7* 9.1* 8.0*   Recent Labs    03/20/2020 0952 03/26/2020 0537  WBC 18.4* 15.1*  RBC 3.32* 2.87*  HCT 28.7* 25.5*  PLT 398 356   Recent Labs    04/05/2020 0000 04/01/2020 0537  NA 142 144  K 3.8 3.5  CL 109 113*  CO2 21* 21*  BUN 45* 42*  CREATININE 1.77* 1.75*  GLUCOSE 284* 264*  CALCIUM 12.0* 11.1*    Assessment and Plan: 1 day s/p I and D left elbow infection and osteomyelitis Cultures sent- show gram pos cocci, cultures pending Drain in place under dressing, will remove tomorrow Okay for active use of elbow to tolerance Will continue to follow

## 2020-03-24 NOTE — Anesthesia Preprocedure Evaluation (Signed)
Anesthesia Evaluation  Patient identified by MRN, date of birth, ID band Patient awake    Reviewed: Allergy & Precautions, H&P , NPO status , Patient's Chart, lab work & pertinent test results, reviewed documented beta blocker date and time   Airway Mallampati: III  TM Distance: >3 FB Neck ROM: Full    Dental no notable dental hx. (+) Poor Dentition, Dental Advisory Given   Pulmonary neg pulmonary ROS,    Pulmonary exam normal breath sounds clear to auscultation       Cardiovascular hypertension, Pt. on medications and Pt. on home beta blockers  Rhythm:Regular Rate:Normal     Neuro/Psych Depression negative neurological ROS     GI/Hepatic negative GI ROS, Neg liver ROS,   Endo/Other  negative endocrine ROSdiabetes, Poorly Controlled, Insulin Dependent, Oral Hypoglycemic Agents  Renal/GU Renal InsufficiencyRenal disease  negative genitourinary   Musculoskeletal   Abdominal   Peds  Hematology negative hematology ROS (+)   Anesthesia Other Findings   Reproductive/Obstetrics negative OB ROS                             Anesthesia Physical  Anesthesia Plan  ASA: III  Anesthesia Plan: MAC   Post-op Pain Management:    Induction: Intravenous  PONV Risk Score and Plan: 2 and Ondansetron and Midazolam  Airway Management Planned: Natural Airway and Nasal Cannula  Additional Equipment:   Intra-op Plan:   Post-operative Plan:   Informed Consent: I have reviewed the patients History and Physical, chart, labs and discussed the procedure including the risks, benefits and alternatives for the proposed anesthesia with the patient or authorized representative who has indicated his/her understanding and acceptance.     Dental advisory given  Plan Discussed with: CRNA and Anesthesiologist  Anesthesia Plan Comments:         Anesthesia Quick Evaluation

## 2020-03-24 NOTE — Progress Notes (Signed)
  Echocardiogram Echocardiogram Transesophageal has been performed.  Johny Chess 04/02/2020, 3:18 PM

## 2020-03-24 NOTE — Progress Notes (Signed)
  Echocardiogram 2D Echocardiogram has been performed.  Fidel Levy 03/31/2020, 12:21 PM

## 2020-03-24 NOTE — Anesthesia Postprocedure Evaluation (Signed)
Anesthesia Post Note  Patient: Deborah Figueroa  Procedure(s) Performed: TRANSESOPHAGEAL ECHOCARDIOGRAM (TEE) (N/A )     Patient location during evaluation: PACU Anesthesia Type: MAC Level of consciousness: awake and alert Pain management: pain level controlled Vital Signs Assessment: post-procedure vital signs reviewed and stable Respiratory status: spontaneous breathing and respiratory function stable Cardiovascular status: stable Postop Assessment: no apparent nausea or vomiting Anesthetic complications: no   No complications documented.  Last Vitals:  Vitals:   04/01/2020 1455 03/25/2020 1500  BP: (!) 149/78 (!) 143/85  Pulse: (!) 118 (!) 118  Resp: 19 (!) 23  Temp:    SpO2: 100% 100%    Last Pain:  Vitals:   03/30/2020 1500  TempSrc:   PainSc: 0-No pain                 Jin Shockley DANIEL

## 2020-03-24 NOTE — CV Procedure (Signed)
    TRANSESOPHAGEAL ECHOCARDIOGRAM   NAME:  Deborah Figueroa   MRN: [id number] DOB:  08-Jul-1955   ADMIT DATE: 04/03/2020  INDICATIONS: Bacteremia  PROCEDURE:   Informed consent was obtained prior to the procedure. The risks, benefits and alternatives for the procedure were discussed and the patient comprehended these risks.  Risks include, but are not limited to, cough, sore throat, vomiting, nausea, somnolence, esophageal and stomach trauma or perforation, bleeding, low blood pressure, aspiration, pneumonia, infection, trauma to the teeth and death.    Procedural time out performed.   Patient received monitored anesthesia care under the supervision of Dr. Tobias Alexander. Patient received a total of 260.5 mg propofol during the procedure.   The transesophageal probe was inserted in the esophagus and stomach without difficulty and multiple views were obtained.    COMPLICATIONS:    There were no immediate complications.  FINDINGS:  LEFT VENTRICLE: EF is hyperdynamic No regional wall motion abnormalities.  RIGHT VENTRICLE: Normal size and function.   LEFT ATRIUM: No thrombus/mass.  LEFT ATRIAL APPENDAGE: No thrombus/mass.   RIGHT ATRIUM: No thrombus/mass.  AORTIC VALVE:  Trileaflet, mild sclerosis. No regurgitation. No vegetation.  MITRAL VALVE:    Normal structure, mild leaflet thickening. Trivial regurgitation. No vegetation.  TRICUSPID VALVE: Normal structure. Trivial regurgitation. No vegetation.  PULMONIC VALVE: Grossly normal structure. Trivial regurgitation. No apparent vegetation.  INTERATRIAL SEPTUM: No PFO or ASD seen by color Doppler. Lipomatous hypertrophy of the septum.  PERICARDIUM: Trivial effusion noted.  DESCENDING AORTA: Moderate diffuse plaque seen. There is a focal calcification in the aortic root.   CONCLUSION: No evidence of endocarditis.   Buford Dresser, MD, PhD [facility]  [address] [place], [place]  [address] [phone]   2:25 PM

## 2020-03-24 NOTE — Transfer of Care (Signed)
Immediate Anesthesia Transfer of Care Note  Patient: Deborah Figueroa  Procedure(s) Performed: TRANSESOPHAGEAL ECHOCARDIOGRAM (TEE) (N/A )  Patient Location: Endoscopy Unit  Anesthesia Type:MAC  Level of Consciousness: drowsy  Airway & Oxygen Therapy: Patient Spontanous Breathing and Patient connected to nasal cannula oxygen  Post-op Assessment: Report given to RN, Post -op Vital signs reviewed and stable and Patient moving all extremities  Post vital signs: Reviewed and stable  Last Vitals:  Vitals Value Taken Time  BP 101/47 04/04/2020 1435  Temp 37.1 C 04/08/2020 1435  Pulse 112 04/11/2020 1437  Resp 22 04/03/2020 1437  SpO2 100 % 04/12/2020 1437  Vitals shown include unvalidated device data.  Last Pain:  Vitals:   04/09/2020 1435  TempSrc: Oral  PainSc:       Patients Stated Pain Goal: 3 (10/26/46 1856)  Complications: No complications documented.

## 2020-03-24 NOTE — Progress Notes (Signed)
    [facility] has been requested to perform a transesophageal echocardiogram on Deborah Figueroa for bacteremia.  After careful review of history and examination, the risks and benefits of transesophageal echocardiogram have been explained including risks of esophageal damage, perforation (1:10,000 risk), bleeding, pharyngeal hematoma as well as other potential complications associated with conscious sedation including aspiration, arrhythmia, respiratory failure and death. Alternatives to treatment were discussed, questions were answered. Patient is willing to proceed.   Cecilie Kicks, NP  04/13/2020 9:32 AM

## 2020-03-25 ENCOUNTER — Encounter (HOSPITAL_COMMUNITY): Payer: Self-pay | Admitting: Cardiology

## 2020-03-25 DIAGNOSIS — M7022 Olecranon bursitis, left elbow: Secondary | ICD-10-CM

## 2020-03-25 DIAGNOSIS — M60832 Other myositis, left forearm: Secondary | ICD-10-CM

## 2020-03-25 DIAGNOSIS — A4101 Sepsis due to Methicillin susceptible Staphylococcus aureus: Principal | ICD-10-CM

## 2020-03-25 DIAGNOSIS — R4182 Altered mental status, unspecified: Secondary | ICD-10-CM

## 2020-03-25 DIAGNOSIS — Z9181 History of falling: Secondary | ICD-10-CM

## 2020-03-25 LAB — MAGNESIUM: Magnesium: 1.4 mg/dL — ABNORMAL LOW (ref 1.7–2.4)

## 2020-03-25 LAB — CBC
HCT: 22.3 % — ABNORMAL LOW (ref 36.0–46.0)
Hemoglobin: 7 g/dL — ABNORMAL LOW (ref 12.0–15.0)
MCH: 27.8 pg (ref 26.0–34.0)
MCHC: 31.4 g/dL (ref 30.0–36.0)
MCV: 88.5 fL (ref 80.0–100.0)
Platelets: 260 10*3/uL (ref 150–400)
RBC: 2.52 MIL/uL — ABNORMAL LOW (ref 3.87–5.11)
RDW: 15.8 % — ABNORMAL HIGH (ref 11.5–15.5)
WBC: 11.2 10*3/uL — ABNORMAL HIGH (ref 4.0–10.5)
nRBC: 0 % (ref 0.0–0.2)

## 2020-03-25 LAB — BASIC METABOLIC PANEL
Anion gap: 13 (ref 5–15)
BUN: 38 mg/dL — ABNORMAL HIGH (ref 8–23)
CO2: 19 mmol/L — ABNORMAL LOW (ref 22–32)
Calcium: 9.7 mg/dL (ref 8.9–10.3)
Chloride: 109 mmol/L (ref 98–111)
Creatinine, Ser: 1.63 mg/dL — ABNORMAL HIGH (ref 0.44–1.00)
GFR, Estimated: 35 mL/min — ABNORMAL LOW (ref >60)
Glucose, Bld: 324 mg/dL — ABNORMAL HIGH (ref 70–99)
Potassium: 3.1 mmol/L — ABNORMAL LOW (ref 3.5–5.1)
Sodium: 141 mmol/L (ref 135–145)

## 2020-03-25 LAB — CULTURE, BLOOD (ROUTINE X 2)

## 2020-03-25 LAB — GLUCOSE, CAPILLARY
Glucose-Capillary: 304 mg/dL — ABNORMAL HIGH (ref 70–99)
Glucose-Capillary: 273 mg/dL — ABNORMAL HIGH (ref 70–99)
Glucose-Capillary: 326 mg/dL — ABNORMAL HIGH (ref 70–99)
Glucose-Capillary: 313 mg/dL — ABNORMAL HIGH (ref 70–99)
Glucose-Capillary: 287 mg/dL — ABNORMAL HIGH (ref 70–99)

## 2020-03-25 MED ORDER — POTASSIUM CHLORIDE 20 MEQ PO PACK
40.0000 meq | PACK | Freq: Two times a day (BID) | ORAL | Status: DC
  Administered 2020-03-25: 40 meq via ORAL
  Filled 2020-03-25 (×2): qty 2

## 2020-03-25 MED ORDER — INSULIN GLARGINE 100 UNIT/ML ~~LOC~~ SOLN
10.0000 [IU] | Freq: Every day | SUBCUTANEOUS | Status: DC
  Administered 2020-03-25: 10 [IU] via SUBCUTANEOUS
  Filled 2020-03-25: qty 0.1

## 2020-03-25 MED ORDER — MAGNESIUM SULFATE 2 GM/50ML IV SOLN
2.0000 g | Freq: Once | INTRAVENOUS | Status: AC
  Administered 2020-03-25: 2 g via INTRAVENOUS
  Filled 2020-03-25: qty 50

## 2020-03-25 MED ORDER — INSULIN ASPART 100 UNIT/ML ~~LOC~~ SOLN
0.0000 [IU] | Freq: Three times a day (TID) | SUBCUTANEOUS | Status: DC
  Administered 2020-03-25: 7 [IU] via SUBCUTANEOUS

## 2020-03-25 NOTE — Progress Notes (Signed)
   PATIENT ID: Cathlean Sauer Poke   1 Day Post-Op Procedure(s) (LRB): TRANSESOPHAGEAL ECHOCARDIOGRAM (TEE) (N/A)  Subjective: Patient denies left elbow pain, no other concerns.  Objective:  Vitals:   04/09/2020 2350 03/25/20 0520  BP: (!) 145/88 138/82  Pulse: (!) 116 (!) 105  Resp:  18  Temp: 98 F (36.7 C) 98.5 F (36.9 C)  SpO2: 93% 97%     L elbow dressing removed today Drain removed No frank puss or discharge No erythema/warmth Incision healing well Steri strip applied to drain site and elbow redressed in soft dressing  Labs:  Recent Labs    03/22/20 0924 04/11/2020 0315 03/19/2020 0952 04/03/2020 0537 03/25/20 0539  HGB 8.9* 8.7* 9.1* 8.0* 7.0*   Recent Labs    04/07/2020 0537 03/25/20 0539  WBC 15.1* 11.2*  RBC 2.87* 2.52*  HCT 25.5* 22.3*  PLT 356 260   Recent Labs    04/03/2020 0537 04/13/2020 1132  NA 144 144  K 3.5 3.3*  CL 113* 111  CO2 21* 22  BUN 42* 42*  CREATININE 1.75* 1.45*  GLUCOSE 264* 269*  CALCIUM 11.1* 10.9*    Assessment and Plan: 2 days s/p I and D left elbow infection and osteomyelitis Cultures sent- show gram pos cocci, cultures pending, abx per primary team/ID Drain removed without incident, soft dressing applied, continue soft dressing for 5 days and then recheck/redress Okay for active use of elbow to tolerance Follow up with Dr. Tamera Punt in 1 week if discharged prior to that point, if not we'll continue to follow

## 2020-03-25 NOTE — NC FL2 (Signed)
[place] LEVEL OF CARE SCREENING TOOL     IDENTIFICATION  Patient Name: Deborah Figueroa Birthdate: 16-Oct-1955 Sex: female Admission Date (Current Location): 03/27/2020  [facility] and [place] Number:  Herbalist and Address:         Provider Number: [id number]  Attending Physician Name and Address:  Dessa Phi, DO  Relative Name and Phone Number:  Starr Urias dtr [phone]    Current Level of Care: Hospital Recommended Level of Care: [place] Prior Approval Number:    Date Approved/Denied:   PASRR Number:    Discharge Plan: SNF    Current Diagnoses: Patient Active Problem List   Diagnosis Date Noted  . Sepsis ([place]) 04/08/2020  . Olecranon bursitis of left elbow 03/22/2020  . Head trauma, initial encounter 03/22/2020  . Acute pain of right shoulder 03/22/2020  . Left elbow pain 03/22/2020  . MSSA bacteremia 03/22/2020  . Acute kidney injury ([place]) 03/22/2020  . Weakness 03/22/2020  . Recurrent falls 03/22/2020  . Sepsis due to methicillin susceptible Staphylococcus aureus ([place]) 03/22/2020  . Osteomyelitis of left elbow ([place]) 03/22/2020  . Osteomyelitis of left ulna ([place]) 03/22/2020  . DKA (diabetic ketoacidosis) ([place]) 03/21/2020  . DM (diabetes mellitus) type II uncontrolled, periph vascular disorder ([place]) 01/12/2018  . Daytime somnolence 10/02/2017  . Diabetes mellitus type II, uncontrolled ([place]) 08/12/2016  . Toenail fungus 07/31/2012  . Hypercalcemia 04/10/2009  . Hyperlipidemia 02/06/2008  . Depression, major, single episode, moderate ([place]) 02/06/2008  . Essential hypertension 02/06/2008    Orientation RESPIRATION BLADDER Height & Weight     Self,Time,Situation  Normal Continent Weight: 93.9 kg (taken from previous charting) Height:  5\' 11"  (180.3 cm)  BEHAVIORAL SYMPTOMS/MOOD NEUROLOGICAL BOWEL NUTRITION STATUS      Continent Diet (CHO MOD)  AMBULATORY STATUS COMMUNICATION OF NEEDS Skin   Limited  Assist Verbally Surgical wounds,Other (Comment) (L elbow surgical incision-iv abx.)                       Personal Care Assistance Level of Assistance  Bathing,Feeding,Dressing Bathing Assistance: Limited assistance Feeding assistance: Limited assistance Dressing Assistance: Limited assistance     Functional Limitations Info  Sight,Hearing,Speech Sight Info: Impaired (eyeglasses/contacts) Hearing Info: Adequate Speech Info: Adequate    SPECIAL CARE FACTORS FREQUENCY  PT (By licensed PT),OT (By licensed OT)     PT Frequency: 5x week OT Frequency: 5x week            Contractures Contractures Info: Not present    Additional Factors Info  Code Status,Allergies,Insulin Sliding Scale,Isolation Precautions Code Status Info:  (Full code) Allergies Info:  (NKA)   Insulin Sliding Scale Info:  (SSI) Isolation Precautions Info:  (MSSA Bacteremia)     Current Medications (03/25/2020):  This is the current hospital active medication list Current Facility-Administered Medications  Medication Dose Route Frequency Provider Last Rate Last Admin  . atorvastatin (LIPITOR) tablet 40 mg  40 mg Oral Daily Buford Dresser, MD   40 mg at 03/25/20 6389  . ceFAZolin (ANCEF) IVPB 2g/100 mL premix  2 g Intravenous Q8H Buford Dresser, MD 200 mL/hr at 03/25/20 0613 2 g at 03/25/20 3734  . dextrose 50 % solution 0-50 mL  0-50 mL Intravenous PRN Buford Dresser, MD      . docusate sodium (COLACE) capsule 100 mg  100 mg Oral BID Buford Dresser, MD   100 mg at 03/25/20 2876  . fenofibrate tablet 160 mg  160  mg Oral Daily Buford Dresser, MD   160 mg at 03/25/20 3736  . insulin aspart (novoLOG) injection 0-9 Units  0-9 Units Subcutaneous TID WC Dessa Phi, DO      . insulin glargine (LANTUS) injection 10 Units  10 Units Subcutaneous QHS Dessa Phi, DO      . melatonin tablet 3 mg  3 mg Oral QHS Lang Snow, NP   3 mg at 04/06/2020 2219  .  metoCLOPramide (REGLAN) tablet 5-10 mg  5-10 mg Oral Q8H PRN Buford Dresser, MD       Or  . metoCLOPramide (REGLAN) injection 5-10 mg  5-10 mg Intravenous Q8H PRN Buford Dresser, MD   10 mg at 03/25/20 0445  . mupirocin ointment (BACTROBAN) 2 % 1 application  1 application Nasal BID Buford Dresser, MD   1 application at 68/15/94 [id number]  . ondansetron (ZOFRAN) tablet 4 mg  4 mg Oral Q6H PRN Buford Dresser, MD       Or  . ondansetron [facility]) injection 4 mg  4 mg Intravenous Q6H PRN Buford Dresser, MD   4 mg at 04/06/2020 2346  . potassium chloride (KLOR-CON) packet 40 mEq  40 mEq Oral BID Dessa Phi, DO         Discharge Medications: Please see discharge summary for a list of discharge medications.  Relevant Imaging Results:  Relevant Lab Results:   Additional Information SS#[phone], Juliann Pulse, [place]

## 2020-03-25 NOTE — Progress Notes (Addendum)
[facility]  Date of Admission:  04/02/2020        Abx: 1/10-c cefazolin  1/09-10 nafcillin  ASSESSMENT: Complicated mssa sepsis Left elbow olecronon bursitis-OM/ulna OM LUE forearm/upper arm myositis/abscess Severe hypercalcemia on unclear etiology ams resolved  65 y.o. female non-ivdu with dm2 poorly controlled (recent hemoglobin A1c 10.4), htn/hlp, admitted 1/08 found to have DKA with fever-severe sepsis/aki, and mssa bacteremiain setting of several weeks progressive weakness, ground level falls, left elbow pain with xray suggestive of possible osseous involvement, and right shoulder pain  1/09 noncontrast left elbow mri was limited due to patient's refusal but mentions imaging osteomyelitis of olecranon and proximal ulna, moderate joint effusion concerning for septic arthritis, proximal forearm flexor compartment intramuscular fluid (1.6x4.3 cm), and small fluid collections upper arm  1/09 mri brain no acute pathology outside of chronic microvascular ischemic changes  1/08 and 1/09 bcx mssa 1/11 bcx ngtd  Patient underwent I&D of LUE olecronon area. tricep insertion at [facility] "completely disrupted" by infectious process. Culture showing gpc clusters   1/12 assessment probable endocarditis with deep tissue abscess/LUE olecronon-ulna OM 1/11 tte no obvious vegetation While osteomyelitis abx course will cover for endocarditis course, I would still pursue tee given its effect on future prognosis/management  Hypercalcemia at this time appears controlled. Await ENT evaluation for parathyroidectomy. I suspect that her severe hypercalcemia had contributed initially to weakness/ams/falls, leading to skin maceration related staph infection. I agree the sooner calcium issue addressed the better  She has been afebrile, with improving leukocytosis, mentation, and subjective well being  PLAN: 1. Continue cefazolin 2 gram iv q8hours 2. Await  tee 3. If she continues to have clinical improvement (no fever for at least 3 days and improved hemodynamics/labs) and 1/11 blood cultures remain negative, there is no contraindication from id standpoint to do parathyroidectomy -- I would wait at least for 3 days of that, and no evidence to wait beyond 7 days   Principal Problem:   MSSA bacteremia Active Problems:   Hypercalcemia   Depression, major, single episode, moderate (HCC)   DKA (diabetic ketoacidosis) ([place])   Acute pain of right shoulder   Left elbow pain   Acute kidney injury ([place])   Weakness   Recurrent falls   Sepsis due to methicillin susceptible Staphylococcus aureus (HCC)   Osteomyelitis of left elbow (HCC)   Osteomyelitis of left ulna (HCC)   Sepsis ([place])   Scheduled Meds: . atorvastatin  40 mg Oral Daily  . docusate sodium  100 mg Oral BID  . fenofibrate  160 mg Oral Daily  . insulin aspart  0-9 Units Subcutaneous Q4H  . insulin glargine  5 Units Subcutaneous QHS  . melatonin  3 mg Oral QHS  . mupirocin ointment  1 application Nasal BID   Continuous Infusions: .  ceFAZolin (ANCEF) IV 2 g (03/25/20 0613)   PRN Meds:.dextrose, metoCLOPramide **OR** metoCLOPramide (REGLAN) injection, ondansetron **OR** ondansetron (ZOFRAN) IV   SUBJECTIVE: Afebrile; wbc improving   Review of Systems: Review of Systems  Psychiatric/Behavioral: Positive for memory loss.   Unable to do as patient in OR  No Known Allergies  OBJECTIVE: Vitals:   03/28/2020 2107 03/29/2020 2340 03/16/2020 2350 03/25/20 0520  BP: 103/67 (!) 148/88 (!) 145/88 138/82  Pulse: (!) 132 (!) 121 (!) 116 (!) 105  Resp: 18 18  18   Temp: 97.9 F (36.6 C) 98 F (36.7 C) 98 F (36.7 C) 98.5 F (36.9 C)  TempSrc: Oral Oral Oral Oral  SpO2: 94%  93% 97%  Weight:      Height:       Body mass index is 28.87 kg/m.  Physical Exam Sitting edge of bed, working with pt. Upset about loosing her I-phone, conversant Acutely ill appearing Heent: bilateral  R>L periorbital purpura and dry crusted blood at nares; per/conj clear otherwise Neck supple cv rrr no mrg Lungs clear normal respiratory effort abd soft Ext no edema Skin minor crusted maceration right knee; left elbow dressign c/d -- drains removed msk no spinous process tenderness, sternoclavicular joint tenderness; no knee/ankle effusion/erythema; she repots diffuse ache and has focal tenderness if directed attention to extremities palpation (however not in particular if distracted) Neuro generalized weakness 4/5 lower ext > upper ext; symmetric strength otherwise; cn2-12 intact otherwise Psych alert; thought content/insight not particularly appropriate for her situation  Lab Results Lab Results  Component Value Date   WBC 11.2 (H) 03/25/2020   HGB 7.0 (L) 03/25/2020   HCT 22.3 (L) 03/25/2020   MCV 88.5 03/25/2020   PLT 260 03/25/2020    Lab Results  Component Value Date   CREATININE 1.63 (H) 03/25/2020   BUN 38 (H) 03/25/2020   NA 141 03/25/2020   K 3.1 (L) 03/25/2020   CL 109 03/25/2020   CO2 19 (L) 03/25/2020    Lab Results  Component Value Date   ALT 21 03/16/2020   AST 14 (L) 04/05/2020   ALKPHOS 99 04/03/2020   BILITOT 1.7 (H) 04/06/2020     Microbiology: Recent Results (from the past 240 hour(s))  SARS CORONAVIRUS 2 (TAT 6-24 HRS) Nasopharyngeal Nasopharyngeal Swab     Status: None   Collection Time: 04/02/2020  2:13 PM   Specimen: Nasopharyngeal Swab  Result Value Ref Range Status   SARS Coronavirus 2 NEGATIVE NEGATIVE Final    Comment: (NOTE) SARS-CoV-2 target nucleic acids are NOT DETECTED.  The SARS-CoV-2 RNA is generally detectable in upper and lower respiratory specimens during the acute phase of infection. Negative results do not preclude SARS-CoV-2 infection, do not rule out co-infections with other pathogens, and should not be used as the sole basis for treatment or other patient management decisions. Negative results must be combined with  clinical observations, patient history, and epidemiological information. The expected result is Negative.  Fact Sheet for Patients: SugarRoll.be  Fact Sheet for Healthcare Providers: https://www.woods-mathews.com/  This test is not yet approved or cleared by the [place] FDA and  has been authorized for detection and/or diagnosis of SARS-CoV-2 by FDA under an Emergency Use Authorization (EUA). This EUA will remain  in effect (meaning this test can be used) for the duration of the COVID-19 declaration under Se ction 564(b)(1) of the Act, 21 U.S.C. section 360bbb-3(b)(1), unless the authorization is terminated or revoked sooner.  Performed at [facility], [place] [address], [place], [place] [address]   Culture, blood (routine x 2)     Status: Abnormal   Collection Time: 04/12/2020  5:08 PM   Specimen: BLOOD  Result Value Ref Range Status   Specimen Description   Final    BLOOD UNKNOWN Performed at [facility] [address], [place], [place] [address]    Special Requests   Final    BOTTLES DRAWN AEROBIC AND ANAEROBIC Blood Culture adequate volume Performed at [facility] [address], [place], [place] [address]    Culture  Setup Time   Final    GRAM POSITIVE  COCCI IN BOTH AEROBIC AND ANAEROBIC BOTTLES CRITICAL RESULT CALLED TO, READ BACK BY AND VERIFIED WITH: PHARMD C.PIERCE AT 1200 ON 03/22/2020 BY T.SAAD Performed at [facility], [place] [address] [place], [place] [address]    Culture STAPHYLOCOCCUS AUREUS (A)  Final   Report Status 03/29/2020 FINAL  Final   Organism ID, Bacteria STAPHYLOCOCCUS AUREUS  Final      Susceptibility   Staphylococcus aureus - MIC*    CIPROFLOXACIN <=0.5 SENSITIVE Sensitive     ERYTHROMYCIN <=0.25 SENSITIVE Sensitive     GENTAMICIN <=0.5 SENSITIVE Sensitive     OXACILLIN 0.5 SENSITIVE Sensitive     TETRACYCLINE <=1 SENSITIVE Sensitive      VANCOMYCIN 1 SENSITIVE Sensitive     TRIMETH/SULFA <=10 SENSITIVE Sensitive     CLINDAMYCIN <=0.25 SENSITIVE Sensitive     RIFAMPIN <=0.5 SENSITIVE Sensitive     Inducible Clindamycin NEGATIVE Sensitive     * STAPHYLOCOCCUS AUREUS  Blood Culture ID Panel (Reflexed)     Status: Abnormal   Collection Time: 03/20/2020  5:08 PM  Result Value Ref Range Status   Enterococcus faecalis NOT DETECTED NOT DETECTED Final   Enterococcus Faecium NOT DETECTED NOT DETECTED Final   Listeria monocytogenes NOT DETECTED NOT DETECTED Final   Staphylococcus species DETECTED (A) NOT DETECTED Final    Comment: CRITICAL RESULT CALLED TO, READ BACK BY AND VERIFIED WITH: PHARMD C.PIERCE AT 1200 ON 03/22/2020 BY T.SAAD    Staphylococcus aureus (BCID) DETECTED (A) NOT DETECTED Final    Comment: CRITICAL RESULT CALLED TO, READ BACK BY AND VERIFIED WITH: PHARMD C.PIERCE AT 1200 ON 03/22/2020 BY T.SAAD    Staphylococcus epidermidis NOT DETECTED NOT DETECTED Final   Staphylococcus lugdunensis NOT DETECTED NOT DETECTED Final   Streptococcus species NOT DETECTED NOT DETECTED Final   Streptococcus agalactiae NOT DETECTED NOT DETECTED Final   Streptococcus pneumoniae NOT DETECTED NOT DETECTED Final   Streptococcus pyogenes NOT DETECTED NOT DETECTED Final   A.calcoaceticus-baumannii NOT DETECTED NOT DETECTED Final   Bacteroides fragilis NOT DETECTED NOT DETECTED Final   Enterobacterales NOT DETECTED NOT DETECTED Final   Enterobacter cloacae complex NOT DETECTED NOT DETECTED Final   Escherichia coli NOT DETECTED NOT DETECTED Final   Klebsiella aerogenes NOT DETECTED NOT DETECTED Final   Klebsiella oxytoca NOT DETECTED NOT DETECTED Final   Klebsiella pneumoniae NOT DETECTED NOT DETECTED Final   Proteus species NOT DETECTED NOT DETECTED Final   Salmonella species NOT DETECTED NOT DETECTED Final   Serratia marcescens NOT DETECTED NOT DETECTED Final   Haemophilus influenzae NOT DETECTED NOT DETECTED Final   Neisseria  meningitidis NOT DETECTED NOT DETECTED Final   Pseudomonas aeruginosa NOT DETECTED NOT DETECTED Final   Stenotrophomonas maltophilia NOT DETECTED NOT DETECTED Final   Candida albicans NOT DETECTED NOT DETECTED Final   Candida auris NOT DETECTED NOT DETECTED Final   Candida glabrata NOT DETECTED NOT DETECTED Final   Candida krusei NOT DETECTED NOT DETECTED Final   Candida parapsilosis NOT DETECTED NOT DETECTED Final   Candida tropicalis NOT DETECTED NOT DETECTED Final   Cryptococcus neoformans/gattii NOT DETECTED NOT DETECTED Final   Meth resistant mecA/C and MREJ NOT DETECTED NOT DETECTED Final    Comment: Performed at [facility], [address], [place],  [address]  Culture, Urine     Status: Abnormal   Collection Time: 03/20/2020 11:48 PM   Specimen: Urine, Catheterized  Result Value Ref Range Status   Specimen Description   Final    URINE,  CATHETERIZED Performed at [facility] [address], [place], [place] [address]    Special Requests   Final    NONE Performed at [facility], [place] [address], [place], [place] [address]    Culture MULTIPLE SPECIES PRESENT, SUGGEST RECOLLECTION (A)  Final   Report Status 03/22/2020 FINAL  Final  Culture, blood (Routine X 2) w Reflex to ID Panel     Status: Abnormal   Collection Time: 03/22/20  2:16 PM   Specimen: BLOOD RIGHT HAND  Result Value Ref Range Status   Specimen Description   Final    BLOOD RIGHT HAND Performed at [facility] [address] [place], [place] [address]    Special Requests   Final    BOTTLES DRAWN AEROBIC AND ANAEROBIC Blood Culture results may not be optimal due to an inadequate volume of blood received in culture bottles Performed at [facility] [address] [place], [place] [address]    Culture  Setup Time   Final    GRAM POSITIVE COCCI IN CLUSTERS IN BOTH AEROBIC AND ANAEROBIC BOTTLES CRITICAL RESULT CALLED TO,  READ BACK BY AND VERIFIED WITH: PHARMD J GADHIA [id number] AT 723 AM BY CM CRITICAL VALUE NOTED.  VALUE IS CONSISTENT WITH PREVIOUSLY REPORTED AND CALLED VALUE.    Culture (A)  Final    STAPHYLOCOCCUS AUREUS SUSCEPTIBILITIES PERFORMED ON PREVIOUS CULTURE WITHIN THE LAST 5 DAYS. Performed at [facility], [place] [address] [place], [place] [address]    Report Status 03/25/2020 FINAL  Final  Surgical PCR screen     Status: Abnormal   Collection Time: 04/12/2020 10:08 AM   Specimen: Nasal Mucosa; Nasal Swab  Result Value Ref Range Status   MRSA, PCR NEGATIVE NEGATIVE Final   Staphylococcus aureus POSITIVE (A) NEGATIVE Final    Comment: (NOTE) The Xpert SA Assay (FDA approved for NASAL specimens in patients 1 years of age and older), is one component of a comprehensive surveillance program. It is not intended to diagnose infection nor to guide or monitor treatment. Performed at [facility], [place] [address] [place], [place] [address]   Aerobic Culture (superficial specimen)     Status: None (Preliminary result)   Collection Time: 03/17/2020  1:42 PM   Specimen: PATH Other  Result Value Ref Range Status   Specimen Description   Final    FLUID  LEFT OLECRANON Performed at [facility] [address], [place], [place] [address]    Special Requests   Final    NONE Performed at [facility], [place] [address], [place], [place] [address]    Gram Stain NO WBC SEEN RARE GRAM POSITIVE COCCI   Final   Culture   Final    TOO YOUNG TO READ Performed at [facility], [place] [address] [place], [place] [address]    Report Status PENDING  Incomplete  Aerobic/Anaerobic Culture (surgical/deep wound)     Status: None (Preliminary result)   Collection Time: 03/19/2020  1:46 PM   Specimen: PATH Soft tissue  Result Value Ref Range Status   Specimen Description   Final    TISSUE LEFT OLECRANON INFECTED Performed at [facility] [address], [place], [place] [address]    Special Requests   Final    NONE Performed at [facility], [place] [address], [place], [place] [address]    Gram Stain   Final  RARE WBC PRESENT, PREDOMINANTLY PMN RARE GRAM POSITIVE COCCI    Culture   Final    TOO YOUNG TO READ Performed at [facility], [place] [address] [place], [place] [address]    Report Status PENDING  Incomplete  Culture, blood (routine x 2)     Status: None (Preliminary result)   Collection Time: 03/14/2020 12:00 AM   Specimen: BLOOD  Result Value Ref Range Status   Specimen Description   Final    BLOOD RIGHT ANTECUBITAL Performed at [facility] [address], [place], [place] [address]    Special Requests   Final    BOTTLES DRAWN AEROBIC ONLY Blood Culture results may not be optimal due to an inadequate volume of blood received in culture bottles Performed at [facility] [facility] [address], [place], [place] [address]    Culture   Final    NO GROWTH 1 DAY Performed at [facility], [place] [address] [place], [place] [address]    Report Status PENDING  Incomplete  Culture, blood (routine x 2)     Status: None (Preliminary result)   Collection Time: 03/23/2020 12:00 AM   Specimen: BLOOD LEFT HAND  Result Value Ref Range Status   Specimen Description   Final    BLOOD LEFT HAND Performed at [facility] [address] [place], [place] [address]    Special Requests   Final    BOTTLES DRAWN AEROBIC ONLY Blood Culture results may not be optimal due to an inadequate volume of blood received in culture bottles Performed at [facility] [address], [place], [place] [address]    Culture   Final    NO GROWTH 1 DAY Performed at [facility], [place] [address], [place], [place] [address]    Report Status PENDING  Incomplete    Serology: 1/08 hiv screen negative  Micro: 1/09 bcx gpc in  clusters 1/08 bcx mssa  Imaging: 1/10 tte  1. Left ventricular ejection fraction, by estimation, is 65 to 70%. The  left ventricle has hyperdynamic function. The left ventricle has no  regional wall motion abnormalities. There is mild left ventricular  hypertrophy. Indeterminate diastolic filling  due to E-A fusion. There is a LV mid-cavity gradient, peak 57 mmHg.  2. Right ventricular systolic function is normal. The right ventricular  size is normal. Tricuspid regurgitation signal is inadequate for assessing  PA pressure.  3. The aortic valve is tricuspid. Aortic valve regurgitation is not  visualized. Mild aortic valve sclerosis is present, with no evidence of  aortic valve stenosis.  4. The mitral valve is normal in structure. No evidence of mitral valve  regurgitation. No evidence of mitral stenosis.  5. The inferior vena cava is normal in size with greater than 50%  respiratory variability, suggesting right atrial pressure of 3 mmHg.   1/09 left elbow mr without contrast 1. Limited, incomplete study. The patient refused further imaging. 2. 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 3. Moderate joint effusion concerning for septic arthritis. 4. Proximal forearm flexor compartment intramuscular fluid collection measuring 1.6 x 4.3 cm with surrounding muscle edema, concerning for abscess. 5. Additional incompletely visualized small fluid collections in the distal upper arm medial to the brachialis muscle and anterior to the biceps myotendinous junction, also concerning for abscesses. 6. High-grade partial tear of the distal triceps tendon, incompletely evaluated.  1/09 brain mr without contrast No evidence of recent infarction, hemorrhage,  or mass.  Chronic hypertensive microhemorrhages.  Minor chronic microvascular ischemic changes.  1/08 cxr No acute pulmonary pathology  Jabier Mutton, [place] [facility] [place] [phone] pager    03/25/2020, 10:35 AM

## 2020-03-25 NOTE — Plan of Care (Signed)
  Problem: Education: Goal: Knowledge of General Education information will improve Description: Including pain rating scale, medication(s)/side effects and non-pharmacologic comfort measures Outcome: Progressing   Problem: Clinical Measurements: Goal: Will remain free from infection Outcome: Progressing   Problem: Coping: Goal: Level of anxiety will decrease Outcome: Progressing   Problem: Safety: Goal: Ability to remain free from injury will improve Outcome: Progressing   Problem: Skin Integrity: Goal: Risk for impaired skin integrity will decrease Outcome: Progressing

## 2020-03-25 NOTE — Progress Notes (Signed)
PROGRESS NOTE    Deborah Figueroa  ZOX:[id number] DOB: 10/19/1955 DOA: 04/13/2020 PCP: Ann Held, DO   Brief Narrative:  Deborah Figueroa is a 65 y.o. year old female with medical history significant for poorly controlled Type 2 Diabetes, chronic hypercalcemia presumed to be primary hyperparathyroidism, depression, HTN, HLD, recent history of recurrent falls requiring ED visits on 12/18 and 12/19 with no concerning findings on imaging at that time and treated supportively, as well as elevated calcium being evaluated as outpatient with concern for her likely hyperparathyroidism with plan to see surgeon on 1/12 who presented on 1/8 after having another fall in her hotel room. She was evaluated by EMS noted to have elevated CBG in the setting of increased thirst, polyuria and recently changed diabetic medications.   Last ED visit on 03/19/20 with XR of left elbow for evaluation of left elbow pain with recent fall and concerning for irregularity of the olecranon with possibility of underlying osteomyelitis and recommended MRI if clinically indicated. Lumbar spine XR, Right shoulder XR, and CT head at that time was negative for any acute findings except for periorbital hematoma.   In the ED, T-max 98.5, heart rate range 110-125, respiratory rate 24-30, initial blood pressure 140/84.  Glucose 650, CO2 13, calcium 13.3, anion gap 19, sodium 132.  UA unremarkable, beta hydroxybutyrate 6.21, pH 7.2 on VBG with CO2 35.7, COVID test negative, WBC 18, hemoglobin 7.7 (previous baseline 9.5 on 12/20).  Chest x-ray was nonacute.  Patient was started on IV insulin drip for management of DKA and type II diabetic as well as IV fluids for hypercalcemia. Hospital course complicated by blood cultures positive for MSSA and patient started on cefazolin, as well as worsening calcium greater than 15 for which patient was increased on IV fluids, given calcitonin and zoledronic acid.  New events last 24 hours /  Subjective: Underwent TEE yesterday which was unremarkable for endocarditis.  She states that she is feeling a little bit better, gives me a thumbs up sign.  Asking for a straw.  Assessment & Plan:   Principal Problem:   MSSA bacteremia Active Problems:   Hypercalcemia   Depression, major, single episode, moderate (HCC)   DKA (diabetic ketoacidosis) (HCC)   Acute pain of right shoulder   Left elbow pain   Acute kidney injury ([place])   Weakness   Recurrent falls   Sepsis due to methicillin susceptible Staphylococcus aureus (HCC)   Osteomyelitis of left elbow (HCC)   Osteomyelitis of left ulna (HCC)   Sepsis (HCC)   Sepsis secondary to MSSA bacteremia, septic chronic left olecranon bursitis with osteomyelitis associated with phlegmon and small abscess in the lower bicep muscle -MRI left elbow revealed osteomyelitis of the olecranon and proximal ulna, moderate joint effusion concerning for septic arthritis, proximal forearm flexor compartment intramuscular fluid collection concerning for abscess -CT forearm/humerus revealed osteomyelitis involving the olecranon with adjacent inflammatory phlegmon and abscess -Status post I&D of the elbow by orthopedic surgery 1/10 -follow-up with Dr. Tamera Punt in 1 week -Surgical cultures showing staph aureus, susceptibilities to follow -Repeat blood culture 1/11 no growth to date -TEE negative for endocarditis -Remains on IV cefazolin -Appreciate infectious disease  Severe hypercalcemia secondary to primary hyperparathyroidism -Has seen endocrinology as an outpatient, referred for surgery -Calcium >15 on admission, status post bisphosphonate, Calcitonin, IVF -Previous hospitalist discussed with on-call ENT, did not recommend surgery currently for ongoing bacteremia.  Could call back for formal consult if calcium continues to worsen and bacteremia  clears -Calcium normal this morning  Weakness with recurrent falls -Likely secondary to ongoing  hypercalcemia -MRI brain negative for any acute CNS involvement -PT eval  Type 2 diabetes mellitus with DKA -Hemoglobin A1c 10.4 -DKA now resolved -Continue Lantus, sliding scale insulin  AKI on CKD stage IIIb -Baseline creatinine 1.88 in July 2021 -Renal ultrasound unremarkable -AKI resolved, continue to hold home lisinopril/HCTZ  Acute on chronic normocytic anemia -Acutely worsened in setting of illness -Continue to monitor CBC  Hypertension -Continue to hold lisinopril/HCTZ  Hyperlipidemia -Continue Lipitor  Hypokalemia -Replace, trend  Hypomagnesemia -Replace, trend   DVT prophylaxis:  SCDs Start: 04/12/2020 1608  Code Status: Full code Family Communication: No family at bedside Disposition Plan:  Status is: Inpatient  Remains inpatient appropriate because:IV treatments appropriate due to intensity of illness or inability to take PO and Inpatient level of care appropriate due to severity of illness   Dispo: The patient is from: Home              Anticipated d/c is to: To be determined              Anticipated d/c date is: > 3 days              Patient currently is not medically stable to d/c.  New IV antibiotics and await final repeat blood culture result.  We will need to readdress her hypercalcemia and hyperparathyroidism with ENT prior to discharge to see if surgical intervention will be inpatient versus outpatient    Antimicrobials:  Anti-infectives (From admission, onward)   Start     Dose/Rate Route Frequency Ordered Stop   03/29/2020 1145  ceFAZolin (ANCEF) IVPB 2g/100 mL premix        2 g 200 mL/hr over 30 Minutes Intravenous Every 8 hours 03/27/2020 1055     03/30/2020 1100  nafcillin 12 g in sodium chloride 0.9 % 500 mL continuous infusion  Status:  Discontinued        12 g 20.8 mL/hr over 24 Hours Intravenous Every 24 hours 04/06/2020 0949 04/12/2020 1055   03/22/20 1500  nafcillin 2 g in sodium chloride 0.9 % 100 mL IVPB  Status:  Discontinued        2  g 200 mL/hr over 30 Minutes Intravenous Every 4 hours 03/22/20 1354 03/30/2020 0949   03/22/20 1345  nafcillin injection 2 g  Status:  Discontinued        2 g Intravenous Every 4 hours 03/22/20 1344 03/22/20 1354   03/22/20 1300  ceFAZolin (ANCEF) IVPB 2g/100 mL premix  Status:  Discontinued        2 g 200 mL/hr over 30 Minutes Intravenous Every 8 hours 03/22/20 1257 03/22/20 1344        Objective: Vitals:   03/28/2020 2107 04/13/2020 2340 04/02/2020 2350 03/25/20 0520  BP: 103/67 (!) 148/88 (!) 145/88 138/82  Pulse: (!) 132 (!) 121 (!) 116 (!) 105  Resp: 18 18  18   Temp: 97.9 F (36.6 C) 98 F (36.7 C) 98 F (36.7 C) 98.5 F (36.9 C)  TempSrc: Oral Oral Oral Oral  SpO2: 94%  93% 97%  Weight:      Height:        Intake/Output Summary (Last 24 hours) at 03/25/2020 1346 Last data filed at 03/25/2020 0319 Gross per 24 hour  Intake 2185.54 ml  Output 650 ml  Net 1535.54 ml   Filed Weights   03/16/2020 0952  Weight: 93.9 kg    Examination:  General exam: Appears calm and comfortable  Respiratory system: Clear to auscultation. Respiratory effort normal. No respiratory distress. No conversational dyspnea.  Cardiovascular system: S1 & S2 heard, tachycardic, regular rhythm. No murmurs.  Gastrointestinal system: Abdomen is nondistended, soft and nontender. Normal bowel sounds heard. Central nervous system: Alert and oriented. Extremities: Some soft tissue swelling of the right upper extremity Skin: Bruising around her right eye Psychiatry: Judgement and insight appear normal. Mood & affect appropriate.   Data Reviewed: I have personally reviewed following labs and imaging studies  CBC: Recent Labs  Lab 03/22/20 0924 04/12/2020 0315 03/15/2020 0952 03/14/2020 0537 03/25/20 0539  WBC 18.9* 19.5* 18.4* 15.1* 11.2*  NEUTROABS  --   --  17.5*  --   --   HGB 8.9* 8.7* 9.1* 8.0* 7.0*  HCT 27.6* 27.3* 28.7* 25.5* 22.3*  MCV 86.5 86.9 86.4 88.9 88.5  PLT 457* 409* 398 356 419   Basic  Metabolic Panel: Recent Labs  Lab 03/16/2020 0952 04/02/2020 0000 03/28/2020 0537 03/18/2020 1132 03/25/20 0539  NA 140 142 144 144 141  K 3.6 3.8 3.5 3.3* 3.1*  CL 110 109 113* 111 109  CO2 20* 21* 21* 22 19*  GLUCOSE 408* 284* 264* 269* 324*  BUN 47* 45* 42* 42* 38*  CREATININE 1.65* 1.77* 1.75* 1.45* 1.63*  CALCIUM 12.6* 12.0* 11.1* 10.9* 9.7  MG  --   --   --  1.5* 1.4*   GFR: Estimated Creatinine Clearance: 44 mL/min (A) (by C-G formula based on SCr of 1.63 mg/dL (H)). Liver Function Tests: Recent Labs  Lab 03/19/20 1047 03/16/2020 0952  AST 10 14*  ALT 18 21  ALKPHOS 125* 99  BILITOT 0.6 1.7*  PROT 6.7 6.2*  ALBUMIN 3.1* 2.0*   No results for input(s): LIPASE, AMYLASE in the last 168 hours. No results for input(s): AMMONIA in the last 168 hours. Coagulation Profile: Recent Labs  Lab 03/23/2020 1132  INR 1.3*   Cardiac Enzymes: No results for input(s): CKTOTAL, CKMB, CKMBINDEX, TROPONINI in the last 168 hours. BNP (last 3 results) No results for input(s): PROBNP in the last 8760 hours. HbA1C: No results for input(s): HGBA1C in the last 72 hours. CBG: Recent Labs  Lab 03/27/2020 1955 04/09/2020 2345 03/25/20 0433 03/25/20 0727 03/25/20 1212  GLUCAP 275* 303* 304* 273* 326*   Lipid Profile: No results for input(s): CHOL, HDL, LDLCALC, TRIG, CHOLHDL, LDLDIRECT in the last 72 hours. Thyroid Function Tests: No results for input(s): TSH, T4TOTAL, FREET4, T3FREE, THYROIDAB in the last 72 hours. Anemia Panel: No results for input(s): VITAMINB12, FOLATE, FERRITIN, TIBC, IRON, RETICCTPCT in the last 72 hours. Sepsis Labs: Recent Labs  Lab 03/22/20 1430 03/22/20 1825  LATICACIDVEN 2.9* 1.6    Recent Results (from the past 240 hour(s))  SARS CORONAVIRUS 2 (TAT 6-24 HRS) Nasopharyngeal Nasopharyngeal Swab     Status: None   Collection Time: 04/03/2020  2:13 PM   Specimen: Nasopharyngeal Swab  Result Value Ref Range Status   SARS Coronavirus 2 NEGATIVE NEGATIVE Final     Comment: (NOTE) SARS-CoV-2 target nucleic acids are NOT DETECTED.  The SARS-CoV-2 RNA is generally detectable in upper and lower respiratory specimens during the acute phase of infection. Negative results do not preclude SARS-CoV-2 infection, do not rule out co-infections with other pathogens, and should not be used as the sole basis for treatment or other patient management decisions. Negative results must be combined with clinical observations, patient history, and epidemiological information. The expected result is Negative.  Fact Sheet for Patients: SugarRoll.be  Fact Sheet for Healthcare Providers: https://www.woods-mathews.com/  This test is not yet approved or cleared by the [place] FDA and  has been authorized for detection and/or diagnosis of SARS-CoV-2 by FDA under an Emergency Use Authorization (EUA). This EUA will remain  in effect (meaning this test can be used) for the duration of the COVID-19 declaration under Se ction 564(b)(1) of the Act, 21 U.S.C. section 360bbb-3(b)(1), unless the authorization is terminated or revoked sooner.  Performed at [facility], [place] [address] [place], [place] [address]   Culture, blood (routine x 2)     Status: Abnormal   Collection Time: 03/16/2020  5:08 PM   Specimen: BLOOD  Result Value Ref Range Status   Specimen Description   Final    BLOOD UNKNOWN Performed at [place] [address] [place], [place] [address]    Special Requests   Final    BOTTLES DRAWN AEROBIC AND ANAEROBIC Blood Culture adequate volume Performed at [facility] [address] [place], [place] [address]    Culture  Setup Time   Final    GRAM POSITIVE COCCI IN BOTH AEROBIC AND ANAEROBIC BOTTLES CRITICAL RESULT CALLED TO, READ BACK BY AND VERIFIED WITH: PHARMD C.PIERCE AT 1200 ON 03/22/2020 BY T.SAAD Performed at [facility], [place] [address],  [place], [place] [address]    Culture STAPHYLOCOCCUS AUREUS (A)  Final   Report Status 04/12/2020 FINAL  Final   Organism ID, Bacteria STAPHYLOCOCCUS AUREUS  Final      Susceptibility   Staphylococcus aureus - MIC*    CIPROFLOXACIN <=0.5 SENSITIVE Sensitive     ERYTHROMYCIN <=0.25 SENSITIVE Sensitive     GENTAMICIN <=0.5 SENSITIVE Sensitive     OXACILLIN 0.5 SENSITIVE Sensitive     TETRACYCLINE <=1 SENSITIVE Sensitive     VANCOMYCIN 1 SENSITIVE Sensitive     TRIMETH/SULFA <=10 SENSITIVE Sensitive     CLINDAMYCIN <=0.25 SENSITIVE Sensitive     RIFAMPIN <=0.5 SENSITIVE Sensitive     Inducible Clindamycin NEGATIVE Sensitive     * STAPHYLOCOCCUS AUREUS  Blood Culture ID Panel (Reflexed)     Status: Abnormal   Collection Time: 04/06/2020  5:08 PM  Result Value Ref Range Status   Enterococcus faecalis NOT DETECTED NOT DETECTED Final   Enterococcus Faecium NOT DETECTED NOT DETECTED Final   Listeria monocytogenes NOT DETECTED NOT DETECTED Final   Staphylococcus species DETECTED (A) NOT DETECTED Final    Comment: CRITICAL RESULT CALLED TO, READ BACK BY AND VERIFIED WITH: PHARMD C.PIERCE AT 1200 ON 03/22/2020 BY T.SAAD    Staphylococcus aureus (BCID) DETECTED (A) NOT DETECTED Final    Comment: CRITICAL RESULT CALLED TO, READ BACK BY AND VERIFIED WITH: PHARMD C.PIERCE AT 1200 ON 03/22/2020 BY T.SAAD    Staphylococcus epidermidis NOT DETECTED NOT DETECTED Final   Staphylococcus lugdunensis NOT DETECTED NOT DETECTED Final   Streptococcus species NOT DETECTED NOT DETECTED Final   Streptococcus agalactiae NOT DETECTED NOT DETECTED Final   Streptococcus pneumoniae NOT DETECTED NOT DETECTED Final   Streptococcus pyogenes NOT DETECTED NOT DETECTED Final   A.calcoaceticus-baumannii NOT DETECTED NOT DETECTED Final   Bacteroides fragilis NOT DETECTED NOT DETECTED Final   Enterobacterales NOT DETECTED NOT DETECTED Final   Enterobacter cloacae complex NOT DETECTED NOT DETECTED Final   Escherichia coli NOT  DETECTED NOT DETECTED Final   Klebsiella aerogenes NOT DETECTED NOT DETECTED Final   Klebsiella oxytoca NOT DETECTED NOT DETECTED Final  Klebsiella pneumoniae NOT DETECTED NOT DETECTED Final   Proteus species NOT DETECTED NOT DETECTED Final   Salmonella species NOT DETECTED NOT DETECTED Final   Serratia marcescens NOT DETECTED NOT DETECTED Final   Haemophilus influenzae NOT DETECTED NOT DETECTED Final   Neisseria meningitidis NOT DETECTED NOT DETECTED Final   Pseudomonas aeruginosa NOT DETECTED NOT DETECTED Final   Stenotrophomonas maltophilia NOT DETECTED NOT DETECTED Final   Candida albicans NOT DETECTED NOT DETECTED Final   Candida auris NOT DETECTED NOT DETECTED Final   Candida glabrata NOT DETECTED NOT DETECTED Final   Candida krusei NOT DETECTED NOT DETECTED Final   Candida parapsilosis NOT DETECTED NOT DETECTED Final   Candida tropicalis NOT DETECTED NOT DETECTED Final   Cryptococcus neoformans/gattii NOT DETECTED NOT DETECTED Final   Meth resistant mecA/C and MREJ NOT DETECTED NOT DETECTED Final    Comment: Performed at [facility], [place] [address], [place], [place] [address]  Culture, Urine     Status: Abnormal   Collection Time: 03/28/2020 11:48 PM   Specimen: Urine, Catheterized  Result Value Ref Range Status   Specimen Description   Final    URINE, CATHETERIZED Performed at [facility] [address], [place], [place] [address]    Special Requests   Final    NONE Performed at [facility], [place] [address], [place], [place] [address]    Culture MULTIPLE SPECIES PRESENT, SUGGEST RECOLLECTION (A)  Final   Report Status 03/16/2020 FINAL  Final  Culture, blood (Routine X 2) w Reflex to ID Panel     Status: Abnormal   Collection Time: 03/22/20  2:16 PM   Specimen: BLOOD RIGHT HAND  Result Value Ref Range Status   Specimen Description   Final    BLOOD RIGHT HAND Performed at [facility] [address],  [place], [place] [address]    Special Requests   Final    BOTTLES DRAWN AEROBIC AND ANAEROBIC Blood Culture results may not be optimal due to an inadequate volume of blood received in culture bottles Performed at [facility] [address], [place], [place] [address]    Culture  Setup Time   Final    GRAM POSITIVE COCCI IN CLUSTERS IN BOTH AEROBIC AND ANAEROBIC BOTTLES CRITICAL RESULT CALLED TO, READ BACK BY AND VERIFIED WITH: PHARMD J GADHIA [id number] AT 723 AM BY CM CRITICAL VALUE NOTED.  VALUE IS CONSISTENT WITH PREVIOUSLY REPORTED AND CALLED VALUE.    Culture (A)  Final    STAPHYLOCOCCUS AUREUS SUSCEPTIBILITIES PERFORMED ON PREVIOUS CULTURE WITHIN THE LAST 5 DAYS. Performed at [facility], [place] [address], [place], [place] [address]    Report Status 03/25/2020 FINAL  Final  Surgical PCR screen     Status: Abnormal   Collection Time: 03/22/2020 10:08 AM   Specimen: Nasal Mucosa; Nasal Swab  Result Value Ref Range Status   MRSA, PCR NEGATIVE NEGATIVE Final   Staphylococcus aureus POSITIVE (A) NEGATIVE Final    Comment: (NOTE) The Xpert SA Assay (FDA approved for NASAL specimens in patients 39 years of age and older), is one component of a comprehensive surveillance program. It is not intended to diagnose infection nor to guide or monitor treatment. Performed at [facility], [place] [address] [place], [place] [address]   Anaerobic culture     Status: None (Preliminary result)   Collection Time: 03/15/2020  1:42 PM   Specimen: PATH Other  Result Value Ref  Range Status   Specimen Description   Final    FLUID  LEFT OLECRANON Performed at [facility] [address], [place], [place] [address]    Special Requests   Final    NONE Performed at [facility], [place] [address], [place], [place] [address]    Culture   Final    NO ANAEROBES ISOLATED; CULTURE IN PROGRESS FOR 5 DAYS   Report Status PENDING   Incomplete  Aerobic Culture (superficial specimen)     Status: None (Preliminary result)   Collection Time: 04/08/2020  1:42 PM   Specimen: PATH Other  Result Value Ref Range Status   Specimen Description   Final    FLUID  LEFT OLECRANON Performed at [facility], [place] [address], [place], [place] [address]    Special Requests   Final    NONE Performed at [facility], [place] [address], [place], [place] [address]    Gram Stain NO WBC SEEN RARE GRAM POSITIVE COCCI   Final   Culture   Final    FEW STAPHYLOCOCCUS AUREUS SUSCEPTIBILITIES TO FOLLOW Performed at [facility], [place] [address], [place], [place] [address]    Report Status PENDING  Incomplete  Aerobic/Anaerobic Culture (surgical/deep wound)     Status: None (Preliminary result)   Collection Time: 04/02/2020  1:46 PM   Specimen: PATH Soft tissue  Result Value Ref Range Status   Specimen Description   Final    TISSUE LEFT OLECRANON INFECTED Performed at [place] [address] [place], [place] [address]    Special Requests   Final    NONE Performed at [facility], [place] [address], [place], [place] [address]    Gram Stain   Final    RARE WBC PRESENT, PREDOMINANTLY PMN RARE GRAM POSITIVE COCCI Performed at [facility], [place] [address] [place], [place] [address]    Culture   Final    FEW STAPHYLOCOCCUS AUREUS SUSCEPTIBILITIES TO FOLLOW NO ANAEROBES ISOLATED; CULTURE IN PROGRESS FOR 5 DAYS    Report Status PENDING  Incomplete  Culture, blood (routine x 2)     Status: None (Preliminary result)   Collection Time: 03/25/2020 12:00 AM   Specimen: BLOOD  Result Value Ref Range Status   Specimen Description   Final    BLOOD RIGHT ANTECUBITAL Performed at [facility] [address] [place], [place] [address]    Special Requests   Final    BOTTLES DRAWN AEROBIC ONLY Blood Culture results may not be optimal due to  an inadequate volume of blood received in culture bottles Performed at [place] [address] [place], [place] [address]    Culture   Final    NO GROWTH 1 DAY Performed at [facility], [place] [address] [place], [place] [address]    Report Status PENDING  Incomplete  Culture, blood (routine x 2)     Status: None (Preliminary result)   Collection Time: 03/17/2020 12:00 AM   Specimen: BLOOD LEFT HAND  Result Value Ref Range Status   Specimen Description   Final    BLOOD LEFT HAND Performed at [facility] [address], [place],  [address]    Special Requests   Final    BOTTLES DRAWN AEROBIC ONLY Blood Culture results may not be optimal due to an inadequate volume of blood received in culture bottles Performed at  [facility], [place] [address] [place], [place] [address]    Culture   Final    NO GROWTH 1 DAY Performed at [facility], [place] [address] [place], [place] [address]    Report Status PENDING  Incomplete      Radiology Studies: ECHOCARDIOGRAM COMPLETE  Result Date: 03/19/2020    ECHOCARDIOGRAM REPORT   Patient Name:   GLADIE GRAVETTE Tegtmeyer Date of Exam: 03/23/2020 Medical Rec #:  [id number]         Height:       71.0 in Accession #:    [id number]        Weight:       207.0 lb Date of Birth:  1955/12/19         BSA:          2.140 m Patient Age:    61 years          BP:           139/78 mmHg Patient Gender: F                 HR:           123 bpm. Exam Location:  Inpatient Procedure: 2D Echo, Color Doppler and Cardiac Doppler Indications:    Bacteremia R78.81  History:        Patient has no prior history of Echocardiogram examinations.                 CHF; Risk Factors:Hypertension, Dyslipidemia and Diabetes.  Sonographer:    Bernadene Person RDCS Referring Phys: [id number] [place]  1. Left ventricular ejection fraction, by estimation, is 65 to 70%. The left ventricle has hyperdynamic  function. The left ventricle has no regional wall motion abnormalities. There is mild left ventricular hypertrophy. Indeterminate diastolic filling due to E-A fusion. There is a LV mid-cavity gradient, peak 57 mmHg.  2. Right ventricular systolic function is normal. The right ventricular size is normal. Tricuspid regurgitation signal is inadequate for assessing PA pressure.  3. The aortic valve is tricuspid. Aortic valve regurgitation is not visualized. Mild aortic valve sclerosis is present, with no evidence of aortic valve stenosis.  4. The mitral valve is normal in structure. No evidence of mitral valve regurgitation. No evidence of mitral stenosis.  5. The inferior vena cava is normal in size with greater than 50% respiratory variability, suggesting right atrial pressure of 3 mmHg. FINDINGS  Left Ventricle: Left ventricular ejection fraction, by estimation, is 65 to 70%. The left ventricle has hyperdynamic function. The left ventricle has no regional wall motion abnormalities. The left ventricular internal cavity size was normal in size. There is mild left ventricular hypertrophy. Indeterminate diastolic filling due to E-A fusion. Right Ventricle: The right ventricular size is normal. No increase in right ventricular wall thickness. Right ventricular systolic function is normal. Tricuspid regurgitation signal is inadequate for assessing PA pressure. Left Atrium: Left atrial size was normal in size. Right Atrium: Right atrial size was normal in size. Pericardium: There is no evidence of pericardial effusion. Mitral Valve: The mitral valve is normal in structure. No evidence of mitral valve regurgitation. No evidence of mitral valve stenosis. Tricuspid Valve: The tricuspid valve is normal in structure. Tricuspid valve regurgitation is trivial. Aortic Valve: The aortic valve is tricuspid. Aortic valve regurgitation is not visualized. Mild aortic valve sclerosis is present, with no evidence of aortic valve stenosis.  Pulmonic Valve: The pulmonic valve was normal in structure.  Pulmonic valve regurgitation is not visualized. Aorta: The aortic root is normal in size and structure. Venous: The inferior vena cava is normal in size with greater than 50% respiratory variability, suggesting right atrial pressure of 3 mmHg. IAS/Shunts: No atrial level shunt detected by color flow Doppler.  LEFT VENTRICLE PLAX 2D LVIDd:         3.20 cm  Diastology LVIDs:         1.80 cm  LV e' medial:    13.20 cm/s LV PW:         1.30 cm  LV E/e' medial:  9.4 LV IVS:        1.30 cm  LV e' lateral:   14.30 cm/s LVOT diam:     1.90 cm  LV E/e' lateral: 8.7 LV SV:         75 LV SV Index:   35 LVOT Area:     2.84 cm  RIGHT VENTRICLE RV S prime:     19.20 cm/s TAPSE (M-mode): 1.9 cm LEFT ATRIUM             Index       RIGHT ATRIUM           Index LA diam:        2.80 cm 1.31 cm/m  RA Area:     13.20 cm LA Vol (A2C):   44.9 ml 20.99 ml/m RA Volume:   30.50 ml  14.25 ml/m LA Vol (A4C):   45.5 ml 21.27 ml/m LA Biplane Vol: 45.1 ml 21.08 ml/m  AORTIC VALVE LVOT Vmax:   178.00 cm/s LVOT Vmean:  138.000 cm/s LVOT VTI:    0.266 m  AORTA Ao Root diam: 3.30 cm Ao Asc diam:  3.60 cm MITRAL VALVE MV Area (PHT): 8.62 cm     SHUNTS MV Decel Time: 88 msec      Systemic VTI:  0.27 m MV E velocity: 124.00 cm/s  Systemic Diam: 1.90 cm MV A velocity: 81.40 cm/s MV E/A ratio:  1.52 Loralie Champagne MD Electronically signed by Loralie Champagne MD Signature Date/Time: 03/15/2020/5:00:32 PM    Final       Scheduled Meds: . atorvastatin  40 mg Oral Daily  . docusate sodium  100 mg Oral BID  . fenofibrate  160 mg Oral Daily  . insulin aspart  0-9 Units Subcutaneous Q4H  . insulin glargine  5 Units Subcutaneous QHS  . melatonin  3 mg Oral QHS  . mupirocin ointment  1 application Nasal BID  . potassium chloride  40 mEq Oral BID   Continuous Infusions: .  ceFAZolin (ANCEF) IV 2 g (03/25/20 7510)     LOS: 4 days      Time spent: 35 minutes   Dessa Phi,  DO [facility] 03/25/2020, 1:46 PM   Available via Epic secure chat 7am-7pm After these hours, please refer to coverage provider listed on [email or web address]

## 2020-03-25 NOTE — TOC Initial Note (Signed)
Transition of Care [facility]) - Initial/Assessment Note    Patient Details  Name: Deborah Figueroa MRN: [id number] Date of Birth: 05/18/1955  Transition [facility]) CM/SW Contact:    Dessa Phi, RN Phone Number: 03/25/2020, 3:54 PM  Clinical Narrative:  Spoke to patient about d/c plans-PT recc SNf-patient agree to SNF;states she has health insurance [employer] rep notified. alos informed [employer] rep to check if self pay-for resources. Per dtr meagan-patient has been evicted from [employer] unable to stay w/family.Faxed out to [facility] bed offers, will need pasrr.                 Expected Discharge Plan: Skilled Nursing Facility Barriers to Discharge: Continued Medical Work up   Patient Goals and CMS Choice Patient states their goals for this hospitalization and ongoing recovery are:: go to rehab CMS Medicare.gov Compare Post Acute Care list provided to:: Patient    Expected Discharge Plan and Services Expected Discharge Plan: [place]   Discharge Planning Services: CM Consult                                          Prior Living Arrangements/Services   Lives with:: Other (Comment) ([employer]per dtr has been evicted) Patient language and need for interpreter reviewed:: Yes        Need for Family Participation in Patient Care: No (Comment) Care giver support system in place?: Yes (comment)   Criminal Activity/Legal Involvement Pertinent to Current Situation/Hospitalization: No - Comment as needed  Activities of Daily Living Home Assistive Devices/Equipment: Contact lenses,CBG Meter,Walker (specify type) (no wheel walker) ADL Screening (condition at time of admission) Patient's cognitive ability adequate to safely complete daily activities?: No (responds slowly to questions) Is the patient deaf or have difficulty hearing?: No Does the patient have difficulty seeing, even when wearing glasses/contacts?: No Does the patient have  difficulty concentrating, remembering, or making decisions?: No Patient able to express need for assistance with ADLs?: Yes Does the patient have difficulty dressing or bathing?: Yes Independently performs ADLs?: No Communication: Independent Dressing (OT): Needs assistance Is this a change from baseline?: Change from baseline, expected to last >3 days Grooming: Needs assistance Is this a change from baseline?: Change from baseline, expected to last >3 days Feeding: Needs assistance Is this a change from baseline?: Change from baseline, expected to last >3 days Bathing: Needs assistance Is this a change from baseline?: Change from baseline, expected to last >3 days Toileting: Needs assistance Is this a change from baseline?: Change from baseline, expected to last >3days In/Out Bed: Needs assistance Is this a change from baseline?: Change from baseline, expected to last >3 days Walks in Home: Needs assistance Is this a change from baseline?: Change from baseline, expected to last >3 days Does the patient have difficulty walking or climbing stairs?: Yes (secondary to weakness) Weakness of Legs: Both Weakness of Arms/Hands: Left  Permission Sought/Granted Permission sought to share information with : Case Manager Permission granted to share information with : Yes, Verbal Permission Granted  Share Information with NAME: Case manager     Permission granted to share info w Relationship: Meagan Burcher dtr [phone]     Emotional Assessment Appearance:: Appears stated age Attitude/Demeanor/Rapport: Gracious Affect (typically observed): Accepting Orientation: : Oriented to Self,Oriented to Place,Oriented to  Time,Oriented to Situation Alcohol / Substance Use: Not Applicable Psych Involvement: No (comment)  Admission diagnosis:  Leukocytosis [D72.829] DKA (diabetic ketoacidosis) (HCC) [E11.10] AKI (acute kidney injury) ([place]) [N17.9] Sepsis ([place]) [A41.9] Diabetic ketoacidosis  without coma associated with type 2 diabetes mellitus ([place]) [E11.10] Patient Active Problem List   Diagnosis Date Noted  . Sepsis ([place]) 03/15/2020  . Olecranon bursitis of left elbow 03/22/2020  . Head trauma, initial encounter 03/22/2020  . Acute pain of right shoulder 03/22/2020  . Left elbow pain 03/22/2020  . MSSA bacteremia 03/22/2020  . Acute kidney injury ([place]) 03/22/2020  . Weakness 03/22/2020  . Recurrent falls 03/22/2020  . Sepsis due to methicillin susceptible Staphylococcus aureus ([place]) 03/22/2020  . 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 DKA (diabetic ketoacidosis) ([place]) 03/15/2020  . DM (diabetes mellitus) type II uncontrolled, periph vascular disorder ([place]) 01/12/2018  . Daytime somnolence 10/02/2017  . Diabetes mellitus type II, uncontrolled ([employer]) 08/12/2016  . Toenail fungus 07/31/2012  . Hypercalcemia 04/10/2009  . Hyperlipidemia 02/06/2008  . Depression, major, single episode, moderate ([place]) 02/06/2008  . Essential hypertension 02/06/2008   PCP:  Ann Held, DO Pharmacy:   [facility] [address], [place] [address] Phone: [phone] Fax: [phone]  [facility] [place], [place] AT [place] [address] [place] [address] Phone: [phone] Fax: [phone]     Social Determinants of Health (SDOH) Interventions    Readmission Risk Interventions No flowsheet data found.
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
## 2020-03-26 LAB — BASIC METABOLIC PANEL
Anion gap: 13 (ref 5–15)
BUN: 35 mg/dL — ABNORMAL HIGH (ref 8–23)
CO2: 18 mmol/L — ABNORMAL LOW (ref 22–32)
Calcium: 9.4 mg/dL (ref 8.9–10.3)
Chloride: 107 mmol/L (ref 98–111)
Creatinine, Ser: 1.68 mg/dL — ABNORMAL HIGH (ref 0.44–1.00)
GFR, Estimated: 34 mL/min — ABNORMAL LOW (ref >60)
Glucose, Bld: 339 mg/dL — ABNORMAL HIGH (ref 70–99)
Potassium: 3.4 mmol/L — ABNORMAL LOW (ref 3.5–5.1)
Sodium: 138 mmol/L (ref 135–145)

## 2020-03-26 LAB — CBC
HCT: 23.5 % — ABNORMAL LOW (ref 36.0–46.0)
Hemoglobin: 7.1 g/dL — ABNORMAL LOW (ref 12.0–15.0)
MCH: 27.1 pg (ref 26.0–34.0)
MCHC: 30.2 g/dL (ref 30.0–36.0)
MCV: 89.7 fL (ref 80.0–100.0)
Platelets: 253 10*3/uL (ref 150–400)
RBC: 2.62 MIL/uL — ABNORMAL LOW (ref 3.87–5.11)
RDW: 15.9 % — ABNORMAL HIGH (ref 11.5–15.5)
WBC: 13 10*3/uL — ABNORMAL HIGH (ref 4.0–10.5)
nRBC: 0 % (ref 0.0–0.2)

## 2020-03-26 LAB — AEROBIC CULTURE W GRAM STAIN (SUPERFICIAL SPECIMEN): Gram Stain: NONE SEEN

## 2020-03-26 LAB — GLUCOSE, CAPILLARY
Glucose-Capillary: 344 mg/dL — ABNORMAL HIGH (ref 70–99)
Glucose-Capillary: 354 mg/dL — ABNORMAL HIGH (ref 70–99)
Glucose-Capillary: 414 mg/dL — ABNORMAL HIGH (ref 70–99)
Glucose-Capillary: 353 mg/dL — ABNORMAL HIGH (ref 70–99)

## 2020-03-26 LAB — MAGNESIUM: Magnesium: 1.7 mg/dL (ref 1.7–2.4)

## 2020-03-26 LAB — GLUCOSE, RANDOM: Glucose, Bld: 340 mg/dL — ABNORMAL HIGH (ref 70–99)

## 2020-03-26 MED ORDER — INSULIN ASPART 100 UNIT/ML ~~LOC~~ SOLN
0.0000 [IU] | Freq: Three times a day (TID) | SUBCUTANEOUS | Status: DC
  Administered 2020-03-26: 11 [IU] via SUBCUTANEOUS
  Administered 2020-03-26: 15 [IU] via SUBCUTANEOUS
  Administered 2020-03-27: 3 [IU] via SUBCUTANEOUS
  Administered 2020-03-27: 8 [IU] via SUBCUTANEOUS
  Administered 2020-03-27 – 2020-03-28 (×2): 5 [IU] via SUBCUTANEOUS
  Administered 2020-03-28: 8 [IU] via SUBCUTANEOUS
  Administered 2020-03-28: 5 [IU] via SUBCUTANEOUS
  Administered 2020-03-29: 8 [IU] via SUBCUTANEOUS

## 2020-03-26 MED ORDER — INSULIN ASPART 100 UNIT/ML ~~LOC~~ SOLN
3.0000 [IU] | Freq: Three times a day (TID) | SUBCUTANEOUS | Status: DC
  Administered 2020-03-26: 3 [IU] via SUBCUTANEOUS

## 2020-03-26 MED ORDER — POTASSIUM CHLORIDE CRYS ER 20 MEQ PO TBCR
40.0000 meq | EXTENDED_RELEASE_TABLET | Freq: Once | ORAL | Status: AC
  Administered 2020-03-26: 40 meq via ORAL
  Filled 2020-03-26: qty 2

## 2020-03-26 MED ORDER — INSULIN ASPART 100 UNIT/ML ~~LOC~~ SOLN
0.0000 [IU] | Freq: Every day | SUBCUTANEOUS | Status: DC
  Administered 2020-03-26: 5 [IU] via SUBCUTANEOUS
  Administered 2020-03-28: 2 [IU] via SUBCUTANEOUS

## 2020-03-26 MED ORDER — INSULIN GLARGINE 100 UNIT/ML ~~LOC~~ SOLN
15.0000 [IU] | Freq: Every day | SUBCUTANEOUS | Status: DC
  Administered 2020-03-26: 15 [IU] via SUBCUTANEOUS
  Filled 2020-03-26: qty 0.15

## 2020-03-26 MED ORDER — INSULIN ASPART 100 UNIT/ML ~~LOC~~ SOLN: 3.0000 [IU] | Freq: Three times a day (TID) | SUBCUTANEOUS | Status: DC

## 2020-03-26 NOTE — Progress Notes (Addendum)
Blood sugar 414 on dinner check. Stat lab verification ordered per protocol. MD Maylene Roes notified and aware. Orders to give one time correction dose of 18units novolog if verification results is still >400. Pt asymptomatic. Will monitor.

## 2020-03-26 NOTE — Progress Notes (Signed)
OT Cancellation Note  Patient Details Name: Deborah Figueroa MRN: [id number] DOB: [birth year]   Cancelled Treatment:    Reason Eval/Treat Not Completed: Patient declined, stating she wanted to eat her meal before getting up. Despite encouragement patient continued to decline.   Helyn Schwan L Olyn Landstrom 03/26/2020, 3:49 PM

## 2020-03-26 NOTE — NC FL2 (Signed)
[place] LEVEL OF CARE SCREENING TOOL     IDENTIFICATION  Patient Name: Deborah Figueroa Birthdate: 1956-01-20 Sex: female Admission Date (Current Location): 03/26/2020  [facility] and [place] Number:  Herbalist and Address:         Provider Number: [id number]  Attending Physician Name and Address:  Dessa Phi, DO  Relative Name and Phone Number:  Teena Mangus dtr [phone]    Current Level of Care: Hospital Recommended Level of Care: [place] Prior Approval Number:    Date Approved/Denied:   PASRR Number: [id number]  Discharge Plan: SNF    Current Diagnoses: Patient Active Problem List   Diagnosis Date Noted  . Sepsis ([place]) 03/31/2020  . Olecranon bursitis of left elbow 03/22/2020  . Head trauma, initial encounter 03/22/2020  . Acute pain of right shoulder 03/22/2020  . Left elbow pain 03/22/2020  . MSSA bacteremia 03/22/2020  . Acute kidney injury ([place]) 03/22/2020  . Weakness 03/22/2020  . Recurrent falls 03/22/2020  . Sepsis due to methicillin susceptible Staphylococcus aureus ([place]) 03/22/2020  . 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 Osteomyelitis of left ulna ([place]) 03/22/2020  . DKA (diabetic ketoacidosis) ([place]) 04/13/2020  . DM (diabetes mellitus) type II uncontrolled, periph vascular disorder ([place]) 01/12/2018  . Daytime somnolence 10/02/2017  . Diabetes mellitus type II, uncontrolled ([place]) 08/12/2016  . Toenail fungus 07/31/2012  . Hypercalcemia 04/10/2009  . Hyperlipidemia 02/06/2008  . Depression, major, single episode, moderate ([place]) 02/06/2008  . Essential hypertension 02/06/2008    Orientation RESPIRATION BLADDER Height & Weight     Self,Time,Situation  Normal Continent Weight: 93.9 kg (taken from previous charting) Height:  5\' 11"  (180.3 cm)  BEHAVIORAL SYMPTOMS/MOOD NEUROLOGICAL BOWEL NUTRITION STATUS      Continent Diet (CHO MOD)  AMBULATORY STATUS COMMUNICATION OF NEEDS Skin    Limited Assist Verbally Surgical wounds,Other (Comment) (L elbow surgical incision-iv abx.)                       Personal Care Assistance Level of Assistance  Bathing,Feeding,Dressing Bathing Assistance: Limited assistance Feeding assistance: Limited assistance Dressing Assistance: Limited assistance     Functional Limitations Info  Sight,Hearing,Speech Sight Info: Impaired (eyeglasses/contacts) Hearing Info: Adequate Speech Info: Adequate    SPECIAL CARE FACTORS FREQUENCY  PT (By licensed PT),OT (By licensed OT)     PT Frequency: 5x week OT Frequency: 5x week            Contractures Contractures Info: Not present    Additional Factors Info  Code Status,Allergies,Insulin Sliding Scale Code Status Info:  (Full code) Allergies Info:  (NKA)   Insulin Sliding Scale Info:  (SSI) Isolation Precautions Info:  (MSSA Bacteremia)     Current Medications (03/26/2020):  This is the current hospital active medication list Current Facility-Administered Medications  Medication Dose Route Frequency Provider Last Rate Last Admin  . atorvastatin (LIPITOR) tablet 40 mg  40 mg Oral Daily Buford Dresser, MD   40 mg at 03/26/20 0931  . ceFAZolin (ANCEF) IVPB 2g/100 mL premix  2 g Intravenous Q8H Buford Dresser, MD 200 mL/hr at 03/26/20 0540 2 g at 03/26/20 0540  . dextrose 50 % solution 0-50 mL  0-50 mL Intravenous PRN Buford Dresser, MD      . docusate sodium (COLACE) capsule 100 mg  100 mg Oral BID Buford Dresser, MD   100 mg at 03/26/20 0930  . fenofibrate tablet 160 mg  160 mg Oral  Daily Buford Dresser, MD   160 mg at 03/26/20 0931  . insulin aspart (novoLOG) injection 0-15 Units  0-15 Units Subcutaneous TID WC Dessa Phi, DO      . insulin glargine (LANTUS) injection 15 Units  15 Units Subcutaneous QHS Dessa Phi, DO      . melatonin tablet 3 mg  3 mg Oral QHS Lang Snow, NP   3 mg at 03/25/20 2221  . metoCLOPramide  (REGLAN) tablet 5-10 mg  5-10 mg Oral Q8H PRN Buford Dresser, MD       Or  . metoCLOPramide (REGLAN) injection 5-10 mg  5-10 mg Intravenous Q8H PRN Buford Dresser, MD   10 mg at 03/25/20 0445  . mupirocin ointment (BACTROBAN) 2 % 1 application  1 application Nasal BID Buford Dresser, MD   1 application at 82/70/78 0931  . ondansetron (ZOFRAN) tablet 4 mg  4 mg Oral Q6H PRN Buford Dresser, MD       Or  . ondansetron [facility]) injection 4 mg  4 mg Intravenous Q6H PRN Buford Dresser, MD   4 mg at 03/15/2020 2346     Discharge Medications: Please see discharge summary for a list of discharge medications.  Relevant Imaging Results:  Relevant Lab Results:   Additional Information SS#[phone], Juliann Pulse, [place]

## 2020-03-26 NOTE — Progress Notes (Signed)
PHARMACY CONSULT NOTE FOR:  OUTPATIENT  PARENTERAL ANTIBIOTIC THERAPY (OPAT)  Indication: MSSA bacteremia w/ associated left elbow olecronon bursitis  Regimen: Cefazolin 2 gm IV q 8 hours  End date: 05/05/2020  IV antibiotic discharge orders are pended. To discharging provider:  please sign these orders via discharge navigator,  Select New Orders & click on the button choice - Manage This Unsigned Work.     Thank you for allowing pharmacy to be a part of this patient's care.  Jimmy Footman, PharmD, BCPS, BCIDP Infectious Diseases Clinical Pharmacist Phone: [phone] 03/26/2020, 2:34 PM

## 2020-03-26 NOTE — Progress Notes (Signed)
PROGRESS NOTE    KOREEN LIZAOLA  MWU:[id number] DOB: 05/27/1955 DOA: 03/14/2020 PCP: Ann Held, DO   Brief Narrative:  Deborah Figueroa is a 65 y.o. year old female with medical history significant for poorly controlled Type 2 Diabetes, chronic hypercalcemia presumed to be primary hyperparathyroidism, depression, HTN, HLD, recent history of recurrent falls requiring ED visits on 12/18 and 12/19 with no concerning findings on imaging at that time and treated supportively, as well as elevated calcium being evaluated as outpatient with concern for her likely hyperparathyroidism with plan to see surgeon on 1/12 who presented on 1/8 after having another fall in her hotel room. She was evaluated by EMS noted to have elevated CBG in the setting of increased thirst, polyuria and recently changed diabetic medications.   Last ED visit on 03/19/20 with XR of left elbow for evaluation of left elbow pain with recent fall and concerning for irregularity of the olecranon with possibility of underlying osteomyelitis and recommended MRI if clinically indicated. Lumbar spine XR, Right shoulder XR, and CT head at that time was negative for any acute findings except for periorbital hematoma.   In the ED, T-max 98.5, heart rate range 110-125, respiratory rate 24-30, initial blood pressure 140/84.  Glucose 650, CO2 13, calcium 13.3, anion gap 19, sodium 132.  UA unremarkable, beta hydroxybutyrate 6.21, pH 7.2 on VBG with CO2 35.7, COVID test negative, WBC 18, hemoglobin 7.7 (previous baseline 9.5 on 12/20).  Chest x-ray was nonacute.  Patient was started on IV insulin drip for management of DKA and type II diabetic as well as IV fluids for hypercalcemia. Hospital course complicated by blood cultures positive for MSSA and patient started on cefazolin, as well as worsening calcium greater than 15 for which patient was increased on IV fluids, given calcitonin and zoledronic acid.  New events last 24 hours /  Subjective: Very upset this morning regarding insurance coverage issues.  She is working on getting in touch with admissions to sorted out.  No physical complaints this morning.  Assessment & Plan:   Principal Problem:   MSSA bacteremia Active Problems:   Hypercalcemia   Depression, major, single episode, moderate (HCC)   DKA (diabetic ketoacidosis) (HCC)   Acute pain of right shoulder   Left elbow pain   Acute kidney injury ([place])   Weakness   Recurrent falls   Sepsis due to methicillin susceptible Staphylococcus aureus (HCC)   Osteomyelitis of left elbow (HCC)   Osteomyelitis of left ulna (HCC)   Sepsis (HCC)   Sepsis secondary to MSSA bacteremia, septic chronic left olecranon bursitis with osteomyelitis associated with phlegmon and small abscess in the lower bicep muscle -MRI left elbow revealed osteomyelitis of the olecranon and proximal ulna, moderate joint effusion concerning for septic arthritis, proximal forearm flexor compartment intramuscular fluid collection concerning for abscess -CT forearm/humerus revealed osteomyelitis involving the olecranon with adjacent inflammatory phlegmon and abscess -Status post I&D of the elbow by orthopedic surgery 1/10 -follow-up with Dr. Tamera Punt in 1 week -Surgical cultures showing staph aureus, pansensitive -Repeat blood culture 1/11 no growth to date -TEE negative for endocarditis -Remains on IV cefazolin -Appreciate infectious disease  Severe hypercalcemia secondary to primary hyperparathyroidism -Has seen endocrinology as an outpatient, referred for surgery -Calcium >15 on admission, status post bisphosphonate, Calcitonin, IVF -Previous hospitalist discussed with on-call ENT, did not recommend surgery currently for ongoing bacteremia.  Could call back for formal consult if calcium continues to worsen and bacteremia clears -Calcium normal this morning,  unclear if surgery will be scheduled inpatient versus outpatient  Weakness with  recurrent falls -Likely secondary to ongoing hypercalcemia -MRI brain negative for any acute CNS involvement -PT eval recommending SNF placement  Type 2 diabetes mellitus with DKA -Hemoglobin A1c 10.4 -DKA now resolved -Continue Lantus, sliding scale insulin  AKI on CKD stage IIIb -Baseline creatinine 1.88 in July 2021 -Renal ultrasound unremarkable -AKI resolved, continue to hold home lisinopril/HCTZ  Acute on chronic normocytic anemia -Acutely worsened in setting of illness -Continue to monitor CBC  Hypertension -Continue to hold lisinopril/HCTZ  Hyperlipidemia -Continue Lipitor  Hypokalemia -Replace, trend    DVT prophylaxis:  SCDs Start: 03/22/2020 1608  Code Status: Full code Family Communication: No family at bedside Disposition Plan:  Status is: Inpatient  Remains inpatient appropriate because:Unsafe d/c plan, IV treatments appropriate due to intensity of illness or inability to take PO and Inpatient level of care appropriate due to severity of illness   Dispo: The patient is from: Home              Anticipated d/c is to: To be determined              Anticipated d/c date is: > 3 days              Patient currently is not medically stable to d/c.  Continue IV antibiotics.  We will need to readdress her hypercalcemia and hyperparathyroidism with ENT prior to discharge to see if surgical intervention will be inpatient versus outpatient. Calcium level remains normal today.    Antimicrobials:  Anti-infectives (From admission, onward)   Start     Dose/Rate Route Frequency Ordered Stop   03/16/2020 1145  ceFAZolin (ANCEF) IVPB 2g/100 mL premix        2 g 200 mL/hr over 30 Minutes Intravenous Every 8 hours 04/04/2020 1055     04/09/2020 1100  nafcillin 12 g in sodium chloride 0.9 % 500 mL continuous infusion  Status:  Discontinued        12 g 20.8 mL/hr over 24 Hours Intravenous Every 24 hours 04/12/2020 0949 04/04/2020 1055   03/22/20 1500  nafcillin 2 g in sodium  chloride 0.9 % 100 mL IVPB  Status:  Discontinued        2 g 200 mL/hr over 30 Minutes Intravenous Every 4 hours 03/22/20 1354 03/26/2020 0949   03/22/20 1345  nafcillin injection 2 g  Status:  Discontinued        2 g Intravenous Every 4 hours 03/22/20 1344 03/22/20 1354   03/22/20 1300  ceFAZolin (ANCEF) IVPB 2g/100 mL premix  Status:  Discontinued        2 g 200 mL/hr over 30 Minutes Intravenous Every 8 hours 03/22/20 1257 03/22/20 1344       Objective: Vitals:   03/25/20 0520 03/25/20 1300 03/25/20 2154 03/26/20 0615  BP: 138/82 135/90 (!) 141/79 (!) 148/81  Pulse: (!) 105 (!) 105 (!) 106 (!) 107  Resp: 18 18 16 18   Temp: 98.5 F (36.9 C) 98.5 F (36.9 C) 98.7 F (37.1 C) 98.7 F (37.1 C)  TempSrc: Oral Oral Oral Oral  SpO2: 97%  97% 99%  Weight:      Height:        Intake/Output Summary (Last 24 hours) at 03/26/2020 1249 Last data filed at 03/26/2020 0616 Gross per 24 hour  Intake 800 ml  Output 1650 ml  Net -850 ml   Filed Weights   03/28/2020 0952  Weight: 93.9 kg  Examination: General exam: Appears calm and comfortable  Respiratory system: Clear to auscultation. Respiratory effort normal. Cardiovascular system: S1 & S2 heard, tachycardic, regular rhythm. No pedal edema. Gastrointestinal system: Abdomen is nondistended, soft and nontender. Normal bowel sounds heard. Central nervous system: Alert and oriented. Non focal exam. Speech clear  Psychiatry: Judgement and insight appear stable. Mood & affect appropriate.    Data Reviewed: I have personally reviewed following labs and imaging studies  CBC: Recent Labs  Lab 04/10/2020 0315 03/19/2020 0952 03/18/2020 0537 03/25/20 0539 03/26/20 0611  WBC 19.5* 18.4* 15.1* 11.2* 13.0*  NEUTROABS  --  17.5*  --   --   --   HGB 8.7* 9.1* 8.0* 7.0* 7.1*  HCT 27.3* 28.7* 25.5* 22.3* 23.5*  MCV 86.9 86.4 88.9 88.5 89.7  PLT 409* 398 356 260 062   Basic Metabolic Panel: Recent Labs  Lab 03/28/2020 0000 03/14/2020 0537  03/31/2020 1132 03/25/20 0539 03/26/20 0611  NA 142 144 144 141 138  K 3.8 3.5 3.3* 3.1* 3.4*  CL 109 113* 111 109 107  CO2 21* 21* 22 19* 18*  GLUCOSE 284* 264* 269* 324* 339*  BUN 45* 42* 42* 38* 35*  CREATININE 1.77* 1.75* 1.45* 1.63* 1.68*  CALCIUM 12.0* 11.1* 10.9* 9.7 9.4  MG  --   --  1.5* 1.4* 1.7   GFR: Estimated Creatinine Clearance: 42.7 mL/min (A) (by C-G formula based on SCr of 1.68 mg/dL (H)). Liver Function Tests: Recent Labs  Lab 04/12/2020 0952  AST 14*  ALT 21  ALKPHOS 99  BILITOT 1.7*  PROT 6.2*  ALBUMIN 2.0*   No results for input(s): LIPASE, AMYLASE in the last 168 hours. No results for input(s): AMMONIA in the last 168 hours. Coagulation Profile: Recent Labs  Lab 03/31/2020 1132  INR 1.3*   Cardiac Enzymes: No results for input(s): CKTOTAL, CKMB, CKMBINDEX, TROPONINI in the last 168 hours. BNP (last 3 results) No results for input(s): PROBNP in the last 8760 hours. HbA1C: No results for input(s): HGBA1C in the last 72 hours. CBG: Recent Labs  Lab 03/25/20 1212 03/25/20 1712 03/25/20 2202 03/26/20 0808 03/26/20 1149  GLUCAP 326* 313* 287* 344* 354*   Lipid Profile: No results for input(s): CHOL, HDL, LDLCALC, TRIG, CHOLHDL, LDLDIRECT in the last 72 hours. Thyroid Function Tests: No results for input(s): TSH, T4TOTAL, FREET4, T3FREE, THYROIDAB in the last 72 hours. Anemia Panel: No results for input(s): VITAMINB12, FOLATE, FERRITIN, TIBC, IRON, RETICCTPCT in the last 72 hours. Sepsis Labs: Recent Labs  Lab 03/22/20 1430 03/22/20 1825  LATICACIDVEN 2.9* 1.6    Recent Results (from the past 240 hour(s))  SARS CORONAVIRUS 2 (TAT 6-24 HRS) Nasopharyngeal Nasopharyngeal Swab     Status: None   Collection Time: 04/05/2020  2:13 PM   Specimen: Nasopharyngeal Swab  Result Value Ref Range Status   SARS Coronavirus 2 NEGATIVE NEGATIVE Final    Comment: (NOTE) SARS-CoV-2 target nucleic acids are NOT DETECTED.  The SARS-CoV-2 RNA is generally  detectable in upper and lower respiratory specimens during the acute phase of infection. Negative results do not preclude SARS-CoV-2 infection, do not rule out co-infections with other pathogens, and should not be used as the sole basis for treatment or other patient management decisions. Negative results must be combined with clinical observations, patient history, and epidemiological information. The expected result is Negative.  Fact Sheet for Patients: SugarRoll.be  Fact Sheet for Healthcare Providers: https://www.woods-mathews.com/  This test is not yet approved or cleared by the [place]  FDA and  has been authorized for detection and/or diagnosis of SARS-CoV-2 by FDA under an Emergency Use Authorization (EUA). This EUA will remain  in effect (meaning this test can be used) for the duration of the COVID-19 declaration under Se ction 564(b)(1) of the Act, 21 U.S.C. section 360bbb-3(b)(1), unless the authorization is terminated or revoked sooner.  Performed at [facility], [place] [address] [place], [place] [address]   Culture, blood (routine x 2)     Status: Abnormal   Collection Time: 03/27/2020  5:08 PM   Specimen: BLOOD  Result Value Ref Range Status   Specimen Description   Final    BLOOD UNKNOWN Performed at [place] [address] [place], [place] [address]    Special Requests   Final    BOTTLES DRAWN AEROBIC AND ANAEROBIC Blood Culture adequate volume Performed at [place] [address], [place], [place] [address]    Culture  Setup Time   Final    GRAM POSITIVE COCCI IN BOTH AEROBIC AND ANAEROBIC BOTTLES CRITICAL RESULT CALLED TO, READ BACK BY AND VERIFIED WITH: PHARMD C.PIERCE AT 1200 ON 03/22/2020 BY T.SAAD Performed at [facility], [place] [address], [place], [place] [address]    Culture STAPHYLOCOCCUS AUREUS (A)  Final   Report Status 04/11/2020 FINAL   Final   Organism ID, Bacteria STAPHYLOCOCCUS AUREUS  Final      Susceptibility   Staphylococcus aureus - MIC*    CIPROFLOXACIN <=0.5 SENSITIVE Sensitive     ERYTHROMYCIN <=0.25 SENSITIVE Sensitive     GENTAMICIN <=0.5 SENSITIVE Sensitive     OXACILLIN 0.5 SENSITIVE Sensitive     TETRACYCLINE <=1 SENSITIVE Sensitive     VANCOMYCIN 1 SENSITIVE Sensitive     TRIMETH/SULFA <=10 SENSITIVE Sensitive     CLINDAMYCIN <=0.25 SENSITIVE Sensitive     RIFAMPIN <=0.5 SENSITIVE Sensitive     Inducible Clindamycin NEGATIVE Sensitive     * STAPHYLOCOCCUS AUREUS  Blood Culture ID Panel (Reflexed)     Status: Abnormal   Collection Time: 04/10/2020  5:08 PM  Result Value Ref Range Status   Enterococcus faecalis NOT DETECTED NOT DETECTED Final   Enterococcus Faecium NOT DETECTED NOT DETECTED Final   Listeria monocytogenes NOT DETECTED NOT DETECTED Final   Staphylococcus species DETECTED (A) NOT DETECTED Final    Comment: CRITICAL RESULT CALLED TO, READ BACK BY AND VERIFIED WITH: PHARMD C.PIERCE AT 1200 ON 03/22/2020 BY T.SAAD    Staphylococcus aureus (BCID) DETECTED (A) NOT DETECTED Final    Comment: CRITICAL RESULT CALLED TO, READ BACK BY AND VERIFIED WITH: PHARMD C.PIERCE AT 1200 ON 03/22/2020 BY T.SAAD    Staphylococcus epidermidis NOT DETECTED NOT DETECTED Final   Staphylococcus lugdunensis NOT DETECTED NOT DETECTED Final   Streptococcus species NOT DETECTED NOT DETECTED Final   Streptococcus agalactiae NOT DETECTED NOT DETECTED Final   Streptococcus pneumoniae NOT DETECTED NOT DETECTED Final   Streptococcus pyogenes NOT DETECTED NOT DETECTED Final   A.calcoaceticus-baumannii NOT DETECTED NOT DETECTED Final   Bacteroides fragilis NOT DETECTED NOT DETECTED Final   Enterobacterales NOT DETECTED NOT DETECTED Final   Enterobacter cloacae complex NOT DETECTED NOT DETECTED Final   Escherichia coli NOT DETECTED NOT DETECTED Final   Klebsiella aerogenes NOT DETECTED NOT DETECTED Final   Klebsiella  oxytoca NOT DETECTED NOT DETECTED Final   Klebsiella pneumoniae NOT DETECTED NOT DETECTED Final   Proteus species NOT DETECTED NOT DETECTED Final   Salmonella species NOT DETECTED NOT  DETECTED Final   Serratia marcescens NOT DETECTED NOT DETECTED Final   Haemophilus influenzae NOT DETECTED NOT DETECTED Final   Neisseria meningitidis NOT DETECTED NOT DETECTED Final   Pseudomonas aeruginosa NOT DETECTED NOT DETECTED Final   Stenotrophomonas maltophilia NOT DETECTED NOT DETECTED Final   Candida albicans NOT DETECTED NOT DETECTED Final   Candida auris NOT DETECTED NOT DETECTED Final   Candida glabrata NOT DETECTED NOT DETECTED Final   Candida krusei NOT DETECTED NOT DETECTED Final   Candida parapsilosis NOT DETECTED NOT DETECTED Final   Candida tropicalis NOT DETECTED NOT DETECTED Final   Cryptococcus neoformans/gattii NOT DETECTED NOT DETECTED Final   Meth resistant mecA/C and MREJ NOT DETECTED NOT DETECTED Final    Comment: Performed at [facility], [place] [address], [place], [place] [address]  Culture, Urine     Status: Abnormal   Collection Time: 04/07/2020 11:48 PM   Specimen: Urine, Catheterized  Result Value Ref Range Status   Specimen Description   Final    URINE, CATHETERIZED Performed at [place] [address] [place], [place] [address]    Special Requests   Final    NONE Performed at [facility], [place] [address], [place], [place] [address]    Culture MULTIPLE SPECIES PRESENT, SUGGEST RECOLLECTION (A)  Final   Report Status 03/26/2020 FINAL  Final  Culture, blood (Routine X 2) w Reflex to ID Panel     Status: Abnormal   Collection Time: 03/22/20  2:16 PM   Specimen: BLOOD RIGHT HAND  Result Value Ref Range Status   Specimen Description   Final    BLOOD RIGHT HAND Performed at [place] [address] [place], [place] [address]    Special Requests   Final    BOTTLES DRAWN AEROBIC AND ANAEROBIC Blood  Culture results may not be optimal due to an inadequate volume of blood received in culture bottles Performed at [place] [address] [place], [place] [address]    Culture  Setup Time   Final    GRAM POSITIVE COCCI IN CLUSTERS IN BOTH AEROBIC AND ANAEROBIC BOTTLES CRITICAL RESULT CALLED TO, READ BACK BY AND VERIFIED WITH: PHARMD J GADHIA [id number] AT 723 AM BY CM CRITICAL VALUE NOTED.  VALUE IS CONSISTENT WITH PREVIOUSLY REPORTED AND CALLED VALUE.    Culture (A)  Final    STAPHYLOCOCCUS AUREUS SUSCEPTIBILITIES PERFORMED ON PREVIOUS CULTURE WITHIN THE LAST 5 DAYS. Performed at [facility], [place] [address] [place], [place] [address]    Report Status 03/25/2020 FINAL  Final  Surgical PCR screen     Status: Abnormal   Collection Time: 03/25/2020 10:08 AM   Specimen: Nasal Mucosa; Nasal Swab  Result Value Ref Range Status   MRSA, PCR NEGATIVE NEGATIVE Final   Staphylococcus aureus POSITIVE (A) NEGATIVE Final    Comment: (NOTE) The Xpert SA Assay (FDA approved for NASAL specimens in patients 42 years of age and older), is one component of a comprehensive surveillance program. It is not intended to diagnose infection nor to guide or monitor treatment. Performed at [facility], [place] [address] [place], [place] [address]   Anaerobic culture     Status: None (Preliminary result)   Collection Time: 03/21/2020  1:42 PM   Specimen: PATH Other  Result Value Ref Range Status   Specimen Description   Final    FLUID  LEFT OLECRANON Performed at [facility], [address] [place], [place] [address]    Special Requests   Final    NONE Performed at [facility], [place] [address], [place], [place] [address]    Culture   Final    NO ANAEROBES ISOLATED; CULTURE IN PROGRESS FOR 5 DAYS   Report Status PENDING  Incomplete  Aerobic Culture (superficial specimen)     Status: None   Collection Time: 04/04/2020  1:42  PM   Specimen: PATH Other  Result Value Ref Range Status   Specimen Description   Final    FLUID  LEFT OLECRANON Performed at [facility], [place] [address], [place], [place] [address]    Special Requests   Final    NONE Performed at [facility], [place] [address], [place], [place] [address]    Gram Stain   Final    NO WBC SEEN RARE GRAM POSITIVE COCCI Performed at [facility], [place] [address], [place], [place] [address]    Culture FEW STAPHYLOCOCCUS AUREUS  Final   Report Status 03/26/2020 FINAL  Final   Organism ID, Bacteria STAPHYLOCOCCUS AUREUS  Final      Susceptibility   Staphylococcus aureus - MIC*    CIPROFLOXACIN <=0.5 SENSITIVE Sensitive     ERYTHROMYCIN <=0.25 SENSITIVE Sensitive     GENTAMICIN <=0.5 SENSITIVE Sensitive     OXACILLIN 0.5 SENSITIVE Sensitive     TETRACYCLINE <=1 SENSITIVE Sensitive     VANCOMYCIN 1 SENSITIVE Sensitive     TRIMETH/SULFA <=10 SENSITIVE Sensitive     CLINDAMYCIN <=0.25 SENSITIVE Sensitive     RIFAMPIN <=0.5 SENSITIVE Sensitive     Inducible Clindamycin NEGATIVE Sensitive     * FEW STAPHYLOCOCCUS AUREUS  Aerobic/Anaerobic Culture (surgical/deep wound)     Status: None (Preliminary result)   Collection Time: 04/03/2020  1:46 PM   Specimen: PATH Soft tissue  Result Value Ref Range Status   Specimen Description   Final    TISSUE LEFT OLECRANON INFECTED Performed at [place] [address] [place], [place] [address]    Special Requests   Final    NONE Performed at [facility], [place] [address], [place], [place] [address]    Gram Stain   Final    RARE WBC PRESENT, PREDOMINANTLY PMN RARE GRAM POSITIVE COCCI Performed at [facility], [place] [address] [place], [place] [address]    Culture   Final    FEW STAPHYLOCOCCUS AUREUS NO ANAEROBES ISOLATED; CULTURE IN PROGRESS FOR 5 DAYS    Report Status PENDING  Incomplete   Organism ID, Bacteria  STAPHYLOCOCCUS AUREUS  Final      Susceptibility   Staphylococcus aureus - MIC*    CIPROFLOXACIN <=0.5 SENSITIVE Sensitive     ERYTHROMYCIN <=0.25 SENSITIVE Sensitive     GENTAMICIN <=0.5 SENSITIVE Sensitive     OXACILLIN 0.5 SENSITIVE Sensitive     TETRACYCLINE <=1 SENSITIVE Sensitive     VANCOMYCIN 1 SENSITIVE Sensitive     TRIMETH/SULFA <=10 SENSITIVE Sensitive     CLINDAMYCIN <=0.25 SENSITIVE Sensitive     RIFAMPIN <=0.5 SENSITIVE Sensitive     Inducible Clindamycin NEGATIVE Sensitive     * FEW STAPHYLOCOCCUS AUREUS  Culture, blood (routine x 2)     Status: None (Preliminary result)   Collection Time: 03/31/2020 12:00 AM   Specimen: BLOOD  Result Value Ref Range Status   Specimen Description   Final    BLOOD RIGHT ANTECUBITAL Performed at [facility]  [place] [address] [place], [place] [address]    Special Requests   Final    BOTTLES DRAWN AEROBIC ONLY Blood Culture results may not be optimal due to an inadequate volume of blood received in culture bottles Performed at [place] [address], [place], [place] [address]    Culture   Final    NO GROWTH 2 DAYS Performed at [place] [address], [place], [place] [address]    Report Status PENDING  Incomplete  Culture, blood (routine x 2)     Status: None (Preliminary result)   Collection Time: 04/07/2020 12:00 AM   Specimen: BLOOD LEFT HAND  Result Value Ref Range Status   Specimen Description   Final    BLOOD LEFT HAND Performed at [place] [address], [place], [place] [address]    Special Requests   Final    BOTTLES DRAWN AEROBIC ONLY Blood Culture results may not be optimal due to an inadequate volume of blood received in culture bottles Performed at [place] [address], [place], [place] [address]    Culture   Final    NO GROWTH 2 DAYS Performed at [place] [address] [place], [place] [address]     Report Status PENDING  Incomplete      Radiology Studies: No results found.    Scheduled Meds: . atorvastatin  40 mg Oral Daily  . docusate sodium  100 mg Oral BID  . fenofibrate  160 mg Oral Daily  . insulin aspart  0-15 Units Subcutaneous TID WC  . insulin aspart  0-5 Units Subcutaneous QHS  . insulin aspart  3 Units Subcutaneous TID WC  . insulin glargine  15 Units Subcutaneous QHS  . melatonin  3 mg Oral QHS  . mupirocin ointment  1 application Nasal BID   Continuous Infusions: .  ceFAZolin (ANCEF) IV 2 g (03/26/20 0540)     LOS: 5 days      Time spent: 25 minutes   Dessa Phi, DO [facility] 03/26/2020, 12:49 PM   Available via Epic secure chat 7am-7pm After these hours, please refer to coverage provider listed on [email or web address]

## 2020-03-26 NOTE — TOC Progression Note (Signed)
Transition of Care [facility]) - Progression Note    Patient Details  Name: Deborah Figueroa MRN: [id number] Date of Birth: 1955-07-18  Transition of Care [facility]) CM/SW Contact  Ameya Vowell, Juliann Pulse, RN Phone Number: 03/26/2020, 9:20 AM  Clinical Narrative: Spoke to admitting about insurance-patient does not have health insurance per hospital acct note-[facility]-not eligible.   9:54a-Received pasrr-added to fl2. Spoke to patient/dtr about per Admitting-Hospital accts-provided w/tel# to dtr for admitting with questions about coverage. Also provided dtr with info on medicaid-DSS,medicare.gov website-informed of no bed offers-will provide info for [facility], homeless shelter list.   Expected Discharge Plan: [place] Barriers to Discharge: Continued Medical Work up  Expected Discharge Plan and Services Expected Discharge Plan: [place]   Discharge Planning Services: CM Consult                                           Social Determinants of Health (SDOH) Interventions    Readmission Risk Interventions No flowsheet data found.

## 2020-03-26 NOTE — Progress Notes (Signed)
Inpatient Diabetes Program Recommendations  AACE/ADA: New Consensus Statement on Inpatient Glycemic Control (2015)  Target Ranges:  Prepandial:   less than 140 mg/dL      Peak postprandial:   less than 180 mg/dL (1-2 hours)      Critically ill patients:  140 - 180 mg/dL   Lab Results  Component Value Date   GLUCAP 344 (H) 03/26/2020   HGBA1C 10.4 (H) 03/19/2020    Review of Glycemic Control  Diabetes history: DM2 Outpatient Diabetes medications: glipizide 2.5 mg QAM, Lantus 10 units QD (not taking) Current orders for Inpatient glycemic control: Lantus 15 units QHS, Novolog 0-15 units tidwc  HgbA1C - 10.4% Post-prandials elevated. Eating approx 50%. CBGs this am - 339, 344 mg/dL  Inpatient Diabetes Program Recommendations:     Add Novolog 3 units tidwc for meal coverage insulin Add Novolog 0-5 HS correction. If FBS > 180 mg/dL, continue to titrate Lantus to 18 units QHS  Spoke to pt at bedside about her diabetes. Pt was not interested in her diabetes, had a hard time focusing on anything. Did say she couldn't remember why she didn't take insulin at home.  Will need assistance at discharge with taking her medications.  Thank you. Lorenda Peck, RD, LDN, CDE Inpatient Diabetes Coordinator [phone]

## 2020-03-26 NOTE — Progress Notes (Signed)
[facility]  Date of Admission:  03/23/2020        Abx: 1/10-c cefazolin  1/09-10 nafcillin  ASSESSMENT: Complicated mssa sepsis Left elbow olecronon bursitis-OM/ulna OM LUE forearm/upper arm myositis/abscess Severe hypercalcemia on unclear etiology ams resolved  65 y.o. female non-ivdu with dm2 poorly controlled (recent hemoglobin A1c 10.4), htn/hlp, admitted 1/08 found to have DKA with fever-severe sepsis/aki, and mssa bacteremiain setting of several weeks progressive weakness, ground level falls, left elbow pain with xray suggestive of possible osseous involvement, and right shoulder pain  1/09 noncontrast left elbow mri was limited due to patient's refusal but mentions imaging osteomyelitis of olecranon and proximal ulna, moderate joint effusion concerning for septic arthritis, proximal forearm flexor compartment intramuscular fluid (1.6x4.3 cm), and small fluid collections upper arm  1/09 mri brain no acute pathology outside of chronic microvascular ischemic changes  1/08 and 1/09 bcx mssa 1/10 left UE olecronon I&D cx mssa 1/11 bcx ngtd   1/13 assessment probable endocarditis with deep tissue abscess/LUE olecronon-ulna OM 1/11 tte no obvious vegetation While osteomyelitis abx course will cover for endocarditis course, I would still pursue tee given its effect on future prognosis/management  Hypercalcemia at this time appears controlled. Await ENT evaluation for parathyroidectomy. I suspect that her severe hypercalcemia had contributed initially to weakness/ams/falls, leading to skin maceration related staph infection. I agree the sooner calcium issue addressed the better  She has been afebrile, with improving leukocytosis, mentation, and subjective well being  1/11 Tee no vegetation  PLAN: 1. Continue cefazolin 2 gram iv q8hours 2. At this time, no other ID w/u; plan 6 weeks iv abx starting 1/11 3. If she continues to have  clinical improvement (no fever for at least 3 days and improved hemodynamics/labs) and 1/11 blood cultures remain negative, there is no contraindication from id standpoint to do parathyroidectomy -- I would wait at least for 3 days of that, and no evidence to wait beyond 7 days  4. Below is her outpatient ID f/u 5. ID will sign off, please call if further question   Appointment date and time: 04/16/2020 @ 1100 with Dr West Bali  Final Antibiotics recommendation/duration: OPAT Order Details            .Outpatient Parenteral Antibiotic Therapy Consult  Until discontinued       Provider:  (Not yet assigned)  Question Answer Comment  Antibiotic Cefazolin (Ancef) IVPB   Indications for use MSSA bacteremia   End Date 05/05/2020                Labs Monitor (check all applied): _x_ weekly cbc, cmp, crp __ weekly vancomycin __ trough or __ predialysis level __ other TDM    [facility] address: [facility] Located in: [facility] Address: [place], [place], [place] [address] Phone: [phone]   Principal Problem:   MSSA bacteremia Active Problems:   Hypercalcemia   Depression, major, single episode, moderate (HCC)   DKA (diabetic ketoacidosis) ([place])   Acute pain of right shoulder   Left elbow pain   Acute kidney injury ([place])   Weakness   Recurrent falls   Sepsis due to methicillin susceptible Staphylococcus aureus ([place])   Osteomyelitis of left elbow (HCC)   Osteomyelitis of left ulna (HCC)   Sepsis ([place])   Scheduled Meds: . atorvastatin  40 mg Oral Daily  . docusate sodium  100 mg Oral BID  . fenofibrate  160 mg Oral Daily  . insulin aspart  0-15 Units Subcutaneous TID WC  . insulin glargine  15 Units Subcutaneous QHS  . melatonin  3 mg Oral QHS  . mupirocin ointment  1 application Nasal BID   Continuous Infusions: .  ceFAZolin (ANCEF) IV 2 g (03/26/20 0540)   PRN Meds:.dextrose, metoCLOPramide **OR** metoCLOPramide  (REGLAN) injection, ondansetron **OR** ondansetron (ZOFRAN) IV   SUBJECTIVE: Afebrile; no new focal pain Not complaining of myalgia today Kept talking about wanting to call her insurance Wbc stable mildly elevated; cr improved now hovering at 1.6s   Review of Systems: Other ROS negative  No Known Allergies  OBJECTIVE: Vitals:   03/25/20 0520 03/25/20 1300 03/25/20 2154 03/26/20 0615  BP: 138/82 135/90 (!) 141/79 (!) 148/81  Pulse: (!) 105 (!) 105 (!) 106 (!) 107  Resp: 18 18 16 18   Temp: 98.5 F (36.9 C) 98.5 F (36.9 C) 98.7 F (37.1 C) 98.7 F (37.1 C)  TempSrc: Oral Oral Oral Oral  SpO2: 97%  97% 99%  Weight:      Height:       Body mass index is 28.87 kg/m.  Physical Exam Sitting edge of bed, working with pt. Upset about loosing her I-phone, conversant Acutely ill appearing Heent: bilateral R>L periorbital purpura and dry crusted blood at nares; per/conj clear otherwise Neck supple cv rrr no mrg Lungs clear normal respiratory effort abd soft Ext no edema Skin minor crusted maceration right knee; left elbow dressign c/d -- drains removed; eschar on right forehead from prior maceration  msk no spinous process tenderness, sternoclavicular joint tenderness; no knee/ankle effusion/erythema Neuro nonfocal Psych alert; fixated on insurance issue today  Lab Results Lab Results  Component Value Date   WBC 13.0 (H) 03/26/2020   HGB 7.1 (L) 03/26/2020   HCT 23.5 (L) 03/26/2020   MCV 89.7 03/26/2020   PLT 253 03/26/2020    Lab Results  Component Value Date   CREATININE 1.68 (H) 03/26/2020   BUN 35 (H) 03/26/2020   NA 138 03/26/2020   K 3.4 (L) 03/26/2020   CL 107 03/26/2020   CO2 18 (L) 03/26/2020    Lab Results  Component Value Date   ALT 21 04/11/2020   AST 14 (L) 04/07/2020   ALKPHOS 99 03/16/2020   BILITOT 1.7 (H) 04/12/2020     Microbiology: Recent Results (from the past 240 hour(s))  SARS CORONAVIRUS 2 (TAT 6-24 HRS) Nasopharyngeal  Nasopharyngeal Swab     Status: None   Collection Time: 03/15/2020  2:13 PM   Specimen: Nasopharyngeal Swab  Result Value Ref Range Status   SARS Coronavirus 2 NEGATIVE NEGATIVE Final    Comment: (NOTE) SARS-CoV-2 target nucleic acids are NOT DETECTED.  The SARS-CoV-2 RNA is generally detectable in upper and lower respiratory specimens during the acute phase of infection. Negative results do not preclude SARS-CoV-2 infection, do not rule out co-infections with other pathogens, and should not be used as the sole basis for treatment or other patient management decisions. Negative results must be combined with clinical observations, patient history, and epidemiological information. The expected result is Negative.  Fact Sheet for Patients: SugarRoll.be  Fact Sheet for Healthcare Providers: https://www.woods-mathews.com/  This test is not yet approved or cleared by the [place] FDA and  has been authorized for detection and/or diagnosis of SARS-CoV-2 by FDA under an Emergency Use Authorization (EUA). This EUA will remain  in effect (meaning this test can be used) for the duration of the COVID-19  declaration under Se ction 564(b)(1) of the Act, 21 U.S.C. section 360bbb-3(b)(1), unless the authorization is terminated or revoked sooner.  Performed at [facility], [place] [address] [place], [place] [address]   Culture, blood (routine x 2)     Status: Abnormal   Collection Time: 03/29/2020  5:08 PM   Specimen: BLOOD  Result Value Ref Range Status   Specimen Description   Final    BLOOD UNKNOWN Performed at [facility] [address], [place], [place] [address]    Special Requests   Final    BOTTLES DRAWN AEROBIC AND ANAEROBIC Blood Culture adequate volume Performed at [facility] [address], [place], [place] [address]    Culture  Setup Time   Final    GRAM POSITIVE COCCI IN BOTH AEROBIC  AND ANAEROBIC BOTTLES CRITICAL RESULT CALLED TO, READ BACK BY AND VERIFIED WITH: PHARMD C.PIERCE AT 1200 ON 03/22/2020 BY T.SAAD Performed at [facility], [place] [address] [place], [place] [address]    Culture STAPHYLOCOCCUS AUREUS (A)  Final   Report Status 03/15/2020 FINAL  Final   Organism ID, Bacteria STAPHYLOCOCCUS AUREUS  Final      Susceptibility   Staphylococcus aureus - MIC*    CIPROFLOXACIN <=0.5 SENSITIVE Sensitive     ERYTHROMYCIN <=0.25 SENSITIVE Sensitive     GENTAMICIN <=0.5 SENSITIVE Sensitive     OXACILLIN 0.5 SENSITIVE Sensitive     TETRACYCLINE <=1 SENSITIVE Sensitive     VANCOMYCIN 1 SENSITIVE Sensitive     TRIMETH/SULFA <=10 SENSITIVE Sensitive     CLINDAMYCIN <=0.25 SENSITIVE Sensitive     RIFAMPIN <=0.5 SENSITIVE Sensitive     Inducible Clindamycin NEGATIVE Sensitive     * STAPHYLOCOCCUS AUREUS  Blood Culture ID Panel (Reflexed)     Status: Abnormal   Collection Time: 04/06/2020  5:08 PM  Result Value Ref Range Status   Enterococcus faecalis NOT DETECTED NOT DETECTED Final   Enterococcus Faecium NOT DETECTED NOT DETECTED Final   Listeria monocytogenes NOT DETECTED NOT DETECTED Final   Staphylococcus species DETECTED (A) NOT DETECTED Final    Comment: CRITICAL RESULT CALLED TO, READ BACK BY AND VERIFIED WITH: PHARMD C.PIERCE AT 1200 ON 03/22/2020 BY T.SAAD    Staphylococcus aureus (BCID) DETECTED (A) NOT DETECTED Final    Comment: CRITICAL RESULT CALLED TO, READ BACK BY AND VERIFIED WITH: PHARMD C.PIERCE AT 1200 ON 03/22/2020 BY T.SAAD    Staphylococcus epidermidis NOT DETECTED NOT DETECTED Final   Staphylococcus lugdunensis NOT DETECTED NOT DETECTED Final   Streptococcus species NOT DETECTED NOT DETECTED Final   Streptococcus agalactiae NOT DETECTED NOT DETECTED Final   Streptococcus pneumoniae NOT DETECTED NOT DETECTED Final   Streptococcus pyogenes NOT DETECTED NOT DETECTED Final   A.calcoaceticus-baumannii NOT DETECTED NOT DETECTED Final    Bacteroides fragilis NOT DETECTED NOT DETECTED Final   Enterobacterales NOT DETECTED NOT DETECTED Final   Enterobacter cloacae complex NOT DETECTED NOT DETECTED Final   Escherichia coli NOT DETECTED NOT DETECTED Final   Klebsiella aerogenes NOT DETECTED NOT DETECTED Final   Klebsiella oxytoca NOT DETECTED NOT DETECTED Final   Klebsiella pneumoniae NOT DETECTED NOT DETECTED Final   Proteus species NOT DETECTED NOT DETECTED Final   Salmonella species NOT DETECTED NOT DETECTED Final   Serratia marcescens NOT DETECTED NOT DETECTED Final   Haemophilus influenzae NOT DETECTED NOT DETECTED Final   Neisseria meningitidis NOT DETECTED NOT DETECTED Final   Pseudomonas aeruginosa NOT DETECTED NOT DETECTED Final  Stenotrophomonas maltophilia NOT DETECTED NOT DETECTED Final   Candida albicans NOT DETECTED NOT DETECTED Final   Candida auris NOT DETECTED NOT DETECTED Final   Candida glabrata NOT DETECTED NOT DETECTED Final   Candida krusei NOT DETECTED NOT DETECTED Final   Candida parapsilosis NOT DETECTED NOT DETECTED Final   Candida tropicalis NOT DETECTED NOT DETECTED Final   Cryptococcus neoformans/gattii NOT DETECTED NOT DETECTED Final   Meth resistant mecA/C and MREJ NOT DETECTED NOT DETECTED Final    Comment: Performed at [facility], [place] [address] [place], [place] [address]  Culture, Urine     Status: Abnormal   Collection Time: 03/14/2020 11:48 PM   Specimen: Urine, Catheterized  Result Value Ref Range Status   Specimen Description   Final    URINE, CATHETERIZED Performed at [facility] [address] [place], [place] [address]    Special Requests   Final    NONE Performed at [facility], [place] [address], [place], [place] [address]    Culture MULTIPLE SPECIES PRESENT, SUGGEST RECOLLECTION (A)  Final   Report Status 03/15/2020 FINAL  Final  Culture, blood (Routine X 2) w Reflex to ID Panel     Status: Abnormal   Collection Time: 03/22/20   2:16 PM   Specimen: BLOOD RIGHT HAND  Result Value Ref Range Status   Specimen Description   Final    BLOOD RIGHT HAND Performed at [facility] [address], [place], [place] [address]    Special Requests   Final    BOTTLES DRAWN AEROBIC AND ANAEROBIC Blood Culture results may not be optimal due to an inadequate volume of blood received in culture bottles Performed at [facility] [address] [place], [place] [address]    Culture  Setup Time   Final    GRAM POSITIVE COCCI IN CLUSTERS IN BOTH AEROBIC AND ANAEROBIC BOTTLES CRITICAL RESULT CALLED TO, READ BACK BY AND VERIFIED WITH: PHARMD J GADHIA [id number] AT 723 AM BY CM CRITICAL VALUE NOTED.  VALUE IS CONSISTENT WITH PREVIOUSLY REPORTED AND CALLED VALUE.    Culture (A)  Final    STAPHYLOCOCCUS AUREUS SUSCEPTIBILITIES PERFORMED ON PREVIOUS CULTURE WITHIN THE LAST 5 DAYS. Performed at [facility], [place] [address] [place], [place] [address]    Report Status 03/25/2020 FINAL  Final  Surgical PCR screen     Status: Abnormal   Collection Time: 03/30/2020 10:08 AM   Specimen: Nasal Mucosa; Nasal Swab  Result Value Ref Range Status   MRSA, PCR NEGATIVE NEGATIVE Final   Staphylococcus aureus POSITIVE (A) NEGATIVE Final    Comment: (NOTE) The Xpert SA Assay (FDA approved for NASAL specimens in patients 18 years of age and older), is one component of a comprehensive surveillance program. It is not intended to diagnose infection nor to guide or monitor treatment. Performed at [facility], [place] [address], [place], [place] [address]   Anaerobic culture     Status: None (Preliminary result)   Collection Time: 03/27/2020  1:42 PM   Specimen: PATH Other  Result Value Ref Range Status   Specimen Description   Final    FLUID  LEFT OLECRANON Performed at [facility] [address] [place], [place] [address]    Special Requests   Final     NONE Performed at [facility], [place] [address], [place], [place] [address]    Culture   Final  NO ANAEROBES ISOLATED; CULTURE IN PROGRESS FOR 5 DAYS   Report Status PENDING  Incomplete  Aerobic Culture (superficial specimen)     Status: None (Preliminary result)   Collection Time: 03/25/2020  1:42 PM   Specimen: PATH Other  Result Value Ref Range Status   Specimen Description   Final    FLUID  LEFT OLECRANON Performed at [facility], [place] [address], [place], [place] [address]    Special Requests   Final    NONE Performed at [facility], [place] [address] [place], [place] [address]    Gram Stain NO WBC SEEN RARE GRAM POSITIVE COCCI   Final   Culture   Final    FEW STAPHYLOCOCCUS AUREUS SUSCEPTIBILITIES TO FOLLOW Performed at [facility], [place] [address], [place], [place] [address]    Report Status PENDING  Incomplete  Aerobic/Anaerobic Culture (surgical/deep wound)     Status: None (Preliminary result)   Collection Time: 03/28/2020  1:46 PM   Specimen: PATH Soft tissue  Result Value Ref Range Status   Specimen Description   Final    TISSUE LEFT OLECRANON INFECTED Performed at [facility] [address] [place] [address], [place], [place] [address]    Special Requests   Final    NONE Performed at [facility], [place] [address] [place], [place] [address]    Gram Stain   Final    RARE WBC PRESENT, PREDOMINANTLY PMN RARE GRAM POSITIVE COCCI Performed at [facility], [place] [address], [place], [place] [address]    Culture   Final    FEW STAPHYLOCOCCUS AUREUS SUSCEPTIBILITIES TO FOLLOW NO ANAEROBES ISOLATED; CULTURE IN PROGRESS FOR 5 DAYS    Report Status PENDING  Incomplete  Culture, blood (routine x 2)     Status: None (Preliminary result)   Collection Time: 03/25/2020 12:00 AM   Specimen: BLOOD  Result Value Ref Range Status   Specimen Description   Final    BLOOD RIGHT  ANTECUBITAL Performed at [facility] [address] [place], [place] [address]    Special Requests   Final    BOTTLES DRAWN AEROBIC ONLY Blood Culture results may not be optimal due to an inadequate volume of blood received in culture bottles Performed at [facility] [address], [place], [place] [address]    Culture   Final    NO GROWTH 2 DAYS Performed at [facility] [address] [place], [place] [address]    Report Status PENDING  Incomplete  Culture, blood (routine x 2)     Status: None (Preliminary result)   Collection Time: 03/20/2020 12:00 AM   Specimen: BLOOD LEFT HAND  Result Value Ref Range Status   Specimen Description   Final    BLOOD LEFT HAND Performed at [facility] [address] [place], [place] [address]    Special Requests   Final    BOTTLES DRAWN AEROBIC ONLY Blood Culture results may not be optimal due to an inadequate volume of blood received in culture bottles Performed at [facility] [address] [place], [place] [address]    Culture   Final    NO GROWTH 2 DAYS Performed at [facility] [address] [place], [place] [address]    Report Status PENDING  Incomplete    Serology: 1/08 hiv screen negative  Micro: 1/09 bcx gpc in clusters 1/08 bcx mssa  Imaging: 1/10 tte  1. Left ventricular ejection fraction, by estimation, is 65 to 70%. The  left ventricle has hyperdynamic function. The left ventricle has no  regional wall motion abnormalities. There is mild left ventricular  hypertrophy. Indeterminate diastolic filling  due to E-A fusion. There is a LV mid-cavity gradient, peak 57 mmHg.  2. Right ventricular systolic function is normal. The right ventricular  size is normal. Tricuspid regurgitation signal is inadequate for assessing  PA pressure.  3. The aortic valve is tricuspid. Aortic valve regurgitation is not  visualized. Mild  aortic valve sclerosis is present, with no evidence of  aortic valve stenosis.  4. The mitral valve is normal in structure. No evidence of mitral valve  regurgitation. No evidence of mitral stenosis.  5. The inferior vena cava is normal in size with greater than 50%  respiratory variability, suggesting right atrial pressure of 3 mmHg.   1/09 left elbow mr without contrast 1. Limited, incomplete study. The patient refused further imaging. 2. 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 3. Moderate joint effusion concerning for septic arthritis. 4. Proximal forearm flexor compartment intramuscular fluid collection measuring 1.6 x 4.3 cm with surrounding muscle edema, concerning for abscess. 5. Additional incompletely visualized small fluid collections in the distal upper arm medial to the brachialis muscle and anterior to the biceps myotendinous junction, also concerning for abscesses. 6. High-grade partial tear of the distal triceps tendon, incompletely evaluated.  1/09 brain mr without contrast No evidence of recent infarction, hemorrhage, or mass.  Chronic hypertensive microhemorrhages.  Minor chronic microvascular ischemic changes.  1/08 cxr No acute pulmonary pathology  1/11 tee no vegetation  Jabier Mutton, [facility] [phone] pager    03/26/2020, 9:55 AM

## 2020-03-26 NOTE — Progress Notes (Signed)
Lab verification for glucose result is 340. Will administer insulin per medication administration using 340 lab draw result. 14 units novolog to be administered.

## 2020-03-27 ENCOUNTER — Inpatient Hospital Stay: Payer: Self-pay

## 2020-03-27 LAB — CBC
HCT: 21.8 % — ABNORMAL LOW (ref 36.0–46.0)
Hemoglobin: 6.7 g/dL — CL (ref 12.0–15.0)
MCH: 27.1 pg (ref 26.0–34.0)
MCHC: 30.7 g/dL (ref 30.0–36.0)
MCV: 88.3 fL (ref 80.0–100.0)
Platelets: 232 10*3/uL (ref 150–400)
RBC: 2.47 MIL/uL — ABNORMAL LOW (ref 3.87–5.11)
RDW: 15.7 % — ABNORMAL HIGH (ref 11.5–15.5)
WBC: 15.6 10*3/uL — ABNORMAL HIGH (ref 4.0–10.5)
nRBC: 0 % (ref 0.0–0.2)

## 2020-03-27 LAB — BASIC METABOLIC PANEL
Anion gap: 11 (ref 5–15)
BUN: 40 mg/dL — ABNORMAL HIGH (ref 8–23)
CO2: 19 mmol/L — ABNORMAL LOW (ref 22–32)
Calcium: 9 mg/dL (ref 8.9–10.3)
Chloride: 109 mmol/L (ref 98–111)
Creatinine, Ser: 1.69 mg/dL — ABNORMAL HIGH (ref 0.44–1.00)
GFR, Estimated: 34 mL/min — ABNORMAL LOW (ref >60)
Glucose, Bld: 309 mg/dL — ABNORMAL HIGH (ref 70–99)
Potassium: 3.2 mmol/L — ABNORMAL LOW (ref 3.5–5.1)
Sodium: 139 mmol/L (ref 135–145)

## 2020-03-27 LAB — GLUCOSE, CAPILLARY
Glucose-Capillary: 276 mg/dL — ABNORMAL HIGH (ref 70–99)
Glucose-Capillary: 217 mg/dL — ABNORMAL HIGH (ref 70–99)
Glucose-Capillary: 174 mg/dL — ABNORMAL HIGH (ref 70–99)
Glucose-Capillary: 129 mg/dL — ABNORMAL HIGH (ref 70–99)

## 2020-03-27 LAB — ABO/RH: ABO/RH(D): A POS

## 2020-03-27 LAB — HEMOGLOBIN AND HEMATOCRIT, BLOOD
HCT: 23.6 % — ABNORMAL LOW (ref 36.0–46.0)
Hemoglobin: 7.8 g/dL — ABNORMAL LOW (ref 12.0–15.0)

## 2020-03-27 LAB — PTH-RELATED PEPTIDE: PTH-related peptide: <2 pmol/L

## 2020-03-27 LAB — PREPARE RBC (CROSSMATCH)

## 2020-03-27 LAB — MAGNESIUM: Magnesium: 1.8 mg/dL (ref 1.7–2.4)

## 2020-03-27 MED ORDER — METOPROLOL SUCCINATE ER 100 MG PO TB24
100.0000 mg | ORAL_TABLET | Freq: Every day | ORAL | Status: DC
  Administered 2020-03-27 – 2020-04-19 (×20): 100 mg via ORAL
  Filled 2020-03-27 (×23): qty 1

## 2020-03-27 MED ORDER — INSULIN GLARGINE 100 UNIT/ML ~~LOC~~ SOLN
18.0000 [IU] | Freq: Every day | SUBCUTANEOUS | Status: DC
  Administered 2020-03-27 – 2020-04-05 (×9): 18 [IU] via SUBCUTANEOUS
  Filled 2020-03-27 (×11): qty 0.18

## 2020-03-27 MED ORDER — SERTRALINE HCL 100 MG PO TABS
100.0000 mg | ORAL_TABLET | Freq: Every day | ORAL | Status: DC
  Administered 2020-03-27 – 2020-04-19 (×19): 100 mg via ORAL
  Filled 2020-03-27 (×21): qty 1

## 2020-03-27 MED ORDER — INSULIN ASPART 100 UNIT/ML ~~LOC~~ SOLN
5.0000 [IU] | Freq: Three times a day (TID) | SUBCUTANEOUS | Status: DC
  Administered 2020-03-27 – 2020-04-04 (×13): 5 [IU] via SUBCUTANEOUS

## 2020-03-27 MED ORDER — SODIUM CHLORIDE 0.9% IV SOLUTION: Freq: Once | INTRAVENOUS | Status: AC

## 2020-03-27 MED ORDER — POTASSIUM CHLORIDE CRYS ER 20 MEQ PO TBCR
40.0000 meq | EXTENDED_RELEASE_TABLET | ORAL | Status: AC
  Administered 2020-03-27 (×2): 40 meq via ORAL
  Filled 2020-03-27 (×2): qty 2

## 2020-03-27 NOTE — Plan of Care (Signed)
  Problem: Education: Goal: Knowledge of General Education information will improve Description: Including pain rating scale, medication(s)/side effects and non-pharmacologic comfort measures Outcome: Progressing   Problem: Coping: Goal: Level of anxiety will decrease Outcome: Progressing   Problem: Elimination: Goal: Will not experience complications related to urinary retention Outcome: Progressing   Problem: Pain Managment: Goal: General experience of comfort will improve Outcome: Progressing   Problem: Safety: Goal: Ability to remain free from injury will improve Outcome: Progressing   Problem: Skin Integrity: Goal: Risk for impaired skin integrity will decrease Outcome: Progressing   Problem: Coping: Goal: Ability to adjust to condition or change in health will improve Outcome: Progressing   Problem: Skin Integrity: Goal: Risk for impaired skin integrity will decrease Outcome: Progressing

## 2020-03-27 NOTE — Progress Notes (Signed)
Id chart check  Dr Maylene Roes inquire about oral options. Given extensive mssa process, bacteremia.... Cefazolin/nafcillin iv 6 weeks at least is standard of care   Her poor insight also is concerning for oral options. We dont have good dedicated oral abx for mssa that is twice a day or less frequent   In the case where we are tied/limited against this 6 wks iv options, the next safe option would be 4 weeks iv cefazoli n, the 2 weeks oral linezolid providing we have source control and she is clinically improving   -continue cefazolin for now -please reengage id in 3-4 weeks if patient is still in house. She has opat set up previously for 6 weeks iv cefazolin

## 2020-03-27 NOTE — Progress Notes (Signed)
PT Cancellation Note  Patient Details Name: Deborah Figueroa MRN: [id number] DOB: 11-Mar-1956   Cancelled Treatment:    Reason Eval/Treat Not Completed: Patient declined, no reason specified Declines to participate today.   Dwyne Hasegawa,KATHrine E 03/27/2020, 4:03 PM Arlyce Dice, DPT Acute Rehabilitation Services Pager: [phone] Office: [phone]

## 2020-03-27 NOTE — Progress Notes (Addendum)
PROGRESS NOTE    Deborah Figueroa  FWY:[id number] DOB: 08/29/1955 DOA: 03/22/2020 PCP: Ann Held, DO   Brief Narrative:  Deborah Figueroa is a 65 y.o. year old female with medical history significant for poorly controlled Type 2 Diabetes, chronic hypercalcemia presumed to be primary hyperparathyroidism, depression, HTN, HLD, recent history of recurrent falls requiring ED visits on 12/18 and 12/19 with no concerning findings on imaging at that time and treated supportively, as well as elevated calcium being evaluated as outpatient with concern for her likely hyperparathyroidism with plan to see surgeon on 1/12 who presented on 1/8 after having another fall in her hotel room. She was evaluated by EMS noted to have elevated CBG in the setting of increased thirst, polyuria and recently changed diabetic medications.   Last ED visit on 03/19/20 with XR of left elbow for evaluation of left elbow pain with recent fall and concerning for irregularity of the olecranon with possibility of underlying osteomyelitis and recommended MRI if clinically indicated. Lumbar spine XR, Right shoulder XR, and CT head at that time was negative for any acute findings except for periorbital hematoma.   In the ED, T-max 98.5, heart rate range 110-125, respiratory rate 24-30, initial blood pressure 140/84.  Glucose 650, CO2 13, calcium 13.3, anion gap 19, sodium 132.  UA unremarkable, beta hydroxybutyrate 6.21, pH 7.2 on VBG with CO2 35.7, COVID test negative, WBC 18, hemoglobin 7.7 (previous baseline 9.5 on 12/20).  Chest x-ray was nonacute.  Patient was started on IV insulin drip for management of DKA and type II diabetic as well as IV fluids for hypercalcemia. Hospital course complicated by blood cultures positive for MSSA and patient started on cefazolin, as well as worsening calcium greater than 15 for which patient was increased on IV fluids, given calcitonin and zoledronic acid.  New events last 24 hours /  Subjective: Patient's hemoglobin dropped overnight to 6.7.  She denies any blood loss, no blood in her stool, vaginal bleeding or vomiting.  She has no acute complaints.  Assessment & Plan:   Principal Problem:   MSSA bacteremia Active Problems:   Hypercalcemia   Depression, major, single episode, moderate (HCC)   DKA (diabetic ketoacidosis) (HCC)   Acute pain of right shoulder   Left elbow pain   Acute kidney injury ([place])   Weakness   Recurrent falls   Sepsis due to methicillin susceptible Staphylococcus aureus (HCC)   Osteomyelitis of left elbow (HCC)   Osteomyelitis of left ulna (HCC)   Sepsis (HCC)   Sepsis secondary to MSSA bacteremia, septic chronic left olecranon bursitis with osteomyelitis associated with phlegmon and small abscess in the lower bicep muscle -MRI left elbow revealed osteomyelitis of the olecranon and proximal ulna, moderate joint effusion concerning for septic arthritis, proximal forearm flexor compartment intramuscular fluid collection concerning for abscess -CT forearm/humerus revealed osteomyelitis involving the olecranon with adjacent inflammatory phlegmon and abscess -Status post I&D of the elbow by orthopedic surgery 1/10 -follow-up with Dr. Tamera Punt in 1 week  -Surgical cultures showing staph aureus, pansensitive -Repeat blood culture 1/11 no growth to date -TEE negative for endocarditis -Appreciate infectious disease; discussed with Dr. Gale Journey.  He recommends 6 weeks of IV cefazolin.  However, patient has poor disposition, due to lack of insurance, she will be difficult to place in SNF, is homeless and unable to set up home health RN to help with IV antibiotics.  As an alternative, it is possible that patient could be on 4 weeks of  IV antibiotics and then transition to 2 weeks of oral linezolid.  If patient remains in hospital in 3 to 4 weeks, could reengage with infectious disease for switching to linezolid at that time. -PICC line ordered  Severe  hypercalcemia secondary to primary hyperparathyroidism -Has seen endocrinology as an outpatient, referred for surgery -Calcium >15 on admission, status post bisphosphonate, Calcitonin, IVF -Previous hospitalist discussed with on-call ENT, did not recommend surgery currently for ongoing bacteremia.  Could call back for formal consult if calcium continues to worsen and bacteremia clears -Calcium remains normal  Weakness with recurrent falls -Likely secondary to ongoing hypercalcemia -MRI brain negative for any acute CNS involvement -PT eval recommending SNF placement  Type 2 diabetes mellitus with DKA -Hemoglobin A1c 10.4 -DKA now resolved -Continue Lantus, sliding scale insulin -dosing adjusted due to hyperglycemia  AKI on CKD stage IIIb -Baseline creatinine 1.88 in July 2021 -Renal ultrasound unremarkable -AKI resolved, continue to hold home lisinopril/HCTZ  Acute on chronic normocytic anemia -Acutely worsened in setting of illness -No sign of acute blood loss -Transfuse 1 unit packed red blood cell today and monitor CBC  Hypertension -Continue to hold lisinopril/HCTZ.  Continue metoprolol  Hyperlipidemia -Continue Lipitor  Hypokalemia -Replace, trend  Depression -Continue Zoloft    DVT prophylaxis:  SCDs Start: 03/31/2020 1608  Code Status: Full code Family Communication: No family at bedside Disposition Plan:  Status is: Inpatient  Remains inpatient appropriate because:Unsafe d/c plan, IV treatments appropriate due to intensity of illness or inability to take PO and Inpatient level of care appropriate due to severity of illness   Dispo: The patient is from: Home              Anticipated d/c is to: To be determined              Anticipated d/c date is: > 3 days              Patient currently is not medically stable to d/c.  Continue IV antibiotics.  We will need to readdress her hypercalcemia and hyperparathyroidism with ENT prior to discharge to see if surgical  intervention will be inpatient versus outpatient. Calcium level remains normal today.     Antimicrobials:  Anti-infectives (From admission, onward)   Start     Dose/Rate Route Frequency Ordered Stop   03/29/2020 1145  ceFAZolin (ANCEF) IVPB 2g/100 mL premix        2 g 200 mL/hr over 30 Minutes Intravenous Every 8 hours 04/05/2020 1055     03/18/2020 1100  nafcillin 12 g in sodium chloride 0.9 % 500 mL continuous infusion  Status:  Discontinued        12 g 20.8 mL/hr over 24 Hours Intravenous Every 24 hours 04/04/2020 0949 03/18/2020 1055   03/22/20 1500  nafcillin 2 g in sodium chloride 0.9 % 100 mL IVPB  Status:  Discontinued        2 g 200 mL/hr over 30 Minutes Intravenous Every 4 hours 03/22/20 1354 04/09/2020 0949   03/22/20 1345  nafcillin injection 2 g  Status:  Discontinued        2 g Intravenous Every 4 hours 03/22/20 1344 03/22/20 1354   03/22/20 1300  ceFAZolin (ANCEF) IVPB 2g/100 mL premix  Status:  Discontinued        2 g 200 mL/hr over 30 Minutes Intravenous Every 8 hours 03/22/20 1257 03/22/20 1344       Objective: Vitals:   03/26/20 2114 03/27/20 0652 03/27/20 1130 03/27/20 1148  BP: (!) 155/90 (!) 167/90 (!) 157/86 (!) 143/87  Pulse: (!) 106 (!) 110 (!) 105 (!) 103  Resp: 16 20  20   Temp: 99 F (37.2 C) 97.9 F (36.6 C) 98.6 F (37 C) 98.4 F (36.9 C)  TempSrc: Oral Oral Oral   SpO2: 99% 99% 98%   Weight:      Height:        Intake/Output Summary (Last 24 hours) at 03/27/2020 1322 Last data filed at 03/27/2020 1610 Gross per 24 hour  Intake 740 ml  Output 800 ml  Net -60 ml   Filed Weights   04/03/2020 0952  Weight: 93.9 kg    Examination: General exam: Appears calm and comfortable  Respiratory system: Clear to auscultation. Respiratory effort normal. Cardiovascular system: S1 & S2 heard, tachycardic, regular rhythm. No pedal edema. Gastrointestinal system: Abdomen is nondistended, soft and nontender. Normal bowel sounds heard. Central nervous system: Alert  and oriented. Non focal exam. Speech clear     Data Reviewed: I have personally reviewed following labs and imaging studies  CBC: Recent Labs  Lab 03/28/2020 0952 03/21/2020 0537 03/25/20 0539 03/26/20 0611 03/27/20 0605  WBC 18.4* 15.1* 11.2* 13.0* 15.6*  NEUTROABS 17.5*  --   --   --   --   HGB 9.1* 8.0* 7.0* 7.1* 6.7*  HCT 28.7* 25.5* 22.3* 23.5* 21.8*  MCV 86.4 88.9 88.5 89.7 88.3  PLT 398 356 260 253 960   Basic Metabolic Panel: Recent Labs  Lab 03/14/2020 0537 03/17/2020 1132 03/25/20 0539 03/26/20 0611 03/26/20 1732 03/27/20 0605  NA 144 144 141 138  --  139  K 3.5 3.3* 3.1* 3.4*  --  3.2*  CL 113* 111 109 107  --  109  CO2 21* 22 19* 18*  --  19*  GLUCOSE 264* 269* 324* 339* 340* 309*  BUN 42* 42* 38* 35*  --  40*  CREATININE 1.75* 1.45* 1.63* 1.68*  --  1.69*  CALCIUM 11.1* 10.9* 9.7 9.4  --  9.0  MG  --  1.5* 1.4* 1.7  --  1.8   GFR: Estimated Creatinine Clearance: 42.5 mL/min (A) (by C-G formula based on SCr of 1.69 mg/dL (H)). Liver Function Tests: Recent Labs  Lab 04/01/2020 0952  AST 14*  ALT 21  ALKPHOS 99  BILITOT 1.7*  PROT 6.2*  ALBUMIN 2.0*   No results for input(s): LIPASE, AMYLASE in the last 168 hours. No results for input(s): AMMONIA in the last 168 hours. Coagulation Profile: Recent Labs  Lab 03/18/2020 1132  INR 1.3*   Cardiac Enzymes: No results for input(s): CKTOTAL, CKMB, CKMBINDEX, TROPONINI in the last 168 hours. BNP (last 3 results) No results for input(s): PROBNP in the last 8760 hours. HbA1C: No results for input(s): HGBA1C in the last 72 hours. CBG: Recent Labs  Lab 03/26/20 1149 03/26/20 1704 03/26/20 2043 03/27/20 0743 03/27/20 1150  GLUCAP 354* 414* 353* 276* 217*   Lipid Profile: No results for input(s): CHOL, HDL, LDLCALC, TRIG, CHOLHDL, LDLDIRECT in the last 72 hours. Thyroid Function Tests: No results for input(s): TSH, T4TOTAL, FREET4, T3FREE, THYROIDAB in the last 72 hours. Anemia Panel: No results for  input(s): VITAMINB12, FOLATE, FERRITIN, TIBC, IRON, RETICCTPCT in the last 72 hours. Sepsis Labs: Recent Labs  Lab 03/22/20 1430 03/22/20 1825  LATICACIDVEN 2.9* 1.6    Recent Results (from the past 240 hour(s))  SARS CORONAVIRUS 2 (TAT 6-24 HRS) Nasopharyngeal Nasopharyngeal Swab     Status: None   Collection  Time: 04/04/2020  2:13 PM   Specimen: Nasopharyngeal Swab  Result Value Ref Range Status   SARS Coronavirus 2 NEGATIVE NEGATIVE Final    Comment: (NOTE) SARS-CoV-2 target nucleic acids are NOT DETECTED.  The SARS-CoV-2 RNA is generally detectable in upper and lower respiratory specimens during the acute phase of infection. Negative results do not preclude SARS-CoV-2 infection, do not rule out co-infections with other pathogens, and should not be used as the sole basis for treatment or other patient management decisions. Negative results must be combined with clinical observations, patient history, and epidemiological information. The expected result is Negative.  Fact Sheet for Patients: SugarRoll.be  Fact Sheet for Healthcare Providers: https://www.woods-mathews.com/  This test is not yet approved or cleared by the [place] FDA and  has been authorized for detection and/or diagnosis of SARS-CoV-2 by FDA under an Emergency Use Authorization (EUA). This EUA will remain  in effect (meaning this test can be used) for the duration of the COVID-19 declaration under Se ction 564(b)(1) of the Act, 21 U.S.C. section 360bbb-3(b)(1), unless the authorization is terminated or revoked sooner.  Performed at [facility], [place] [address], [place], [place] [address]   Culture, blood (routine x 2)     Status: Abnormal   Collection Time: 03/15/2020  5:08 PM   Specimen: BLOOD  Result Value Ref Range Status   Specimen Description   Final    BLOOD UNKNOWN Performed at [facility] [address] [place],  [place] [address]    Special Requests   Final    BOTTLES DRAWN AEROBIC AND ANAEROBIC Blood Culture adequate volume Performed at [facility] [address], [place], [place] [address]    Culture  Setup Time   Final    GRAM POSITIVE COCCI IN BOTH AEROBIC AND ANAEROBIC BOTTLES CRITICAL RESULT CALLED TO, READ BACK BY AND VERIFIED WITH: PHARMD C.PIERCE AT 1200 ON 03/22/2020 BY T.SAAD Performed at [facility], [place] [address] [place], [place] [address]    Culture STAPHYLOCOCCUS AUREUS (A)  Final   Report Status 03/29/2020 FINAL  Final   Organism ID, Bacteria STAPHYLOCOCCUS AUREUS  Final      Susceptibility   Staphylococcus aureus - MIC*    CIPROFLOXACIN <=0.5 SENSITIVE Sensitive     ERYTHROMYCIN <=0.25 SENSITIVE Sensitive     GENTAMICIN <=0.5 SENSITIVE Sensitive     OXACILLIN 0.5 SENSITIVE Sensitive     TETRACYCLINE <=1 SENSITIVE Sensitive     VANCOMYCIN 1 SENSITIVE Sensitive     TRIMETH/SULFA <=10 SENSITIVE Sensitive     CLINDAMYCIN <=0.25 SENSITIVE Sensitive     RIFAMPIN <=0.5 SENSITIVE Sensitive     Inducible Clindamycin NEGATIVE Sensitive     * STAPHYLOCOCCUS AUREUS  Blood Culture ID Panel (Reflexed)     Status: Abnormal   Collection Time: 03/20/2020  5:08 PM  Result Value Ref Range Status   Enterococcus faecalis NOT DETECTED NOT DETECTED Final   Enterococcus Faecium NOT DETECTED NOT DETECTED Final   Listeria monocytogenes NOT DETECTED NOT DETECTED Final   Staphylococcus species DETECTED (A) NOT DETECTED Final    Comment: CRITICAL RESULT CALLED TO, READ BACK BY AND VERIFIED WITH: PHARMD C.PIERCE AT 1200 ON 03/22/2020 BY T.SAAD    Staphylococcus aureus (BCID) DETECTED (A) NOT DETECTED Final    Comment: CRITICAL RESULT CALLED TO, READ BACK BY AND VERIFIED WITH: PHARMD C.PIERCE AT 1200 ON 03/22/2020 BY T.SAAD    Staphylococcus epidermidis NOT DETECTED NOT DETECTED Final   Staphylococcus  lugdunensis NOT DETECTED NOT DETECTED Final   Streptococcus species NOT  DETECTED NOT DETECTED Final   Streptococcus agalactiae NOT DETECTED NOT DETECTED Final   Streptococcus pneumoniae NOT DETECTED NOT DETECTED Final   Streptococcus pyogenes NOT DETECTED NOT DETECTED Final   A.calcoaceticus-baumannii NOT DETECTED NOT DETECTED Final   Bacteroides fragilis NOT DETECTED NOT DETECTED Final   Enterobacterales NOT DETECTED NOT DETECTED Final   Enterobacter cloacae complex NOT DETECTED NOT DETECTED Final   Escherichia coli NOT DETECTED NOT DETECTED Final   Klebsiella aerogenes NOT DETECTED NOT DETECTED Final   Klebsiella oxytoca NOT DETECTED NOT DETECTED Final   Klebsiella pneumoniae NOT DETECTED NOT DETECTED Final   Proteus species NOT DETECTED NOT DETECTED Final   Salmonella species NOT DETECTED NOT DETECTED Final   Serratia marcescens NOT DETECTED NOT DETECTED Final   Haemophilus influenzae NOT DETECTED NOT DETECTED Final   Neisseria meningitidis NOT DETECTED NOT DETECTED Final   Pseudomonas aeruginosa NOT DETECTED NOT DETECTED Final   Stenotrophomonas maltophilia NOT DETECTED NOT DETECTED Final   Candida albicans NOT DETECTED NOT DETECTED Final   Candida auris NOT DETECTED NOT DETECTED Final   Candida glabrata NOT DETECTED NOT DETECTED Final   Candida krusei NOT DETECTED NOT DETECTED Final   Candida parapsilosis NOT DETECTED NOT DETECTED Final   Candida tropicalis NOT DETECTED NOT DETECTED Final   Cryptococcus neoformans/gattii NOT DETECTED NOT DETECTED Final   Meth resistant mecA/C and MREJ NOT DETECTED NOT DETECTED Final    Comment: Performed at [facility], [address], [place], [place] [address]  Culture, Urine     Status: Abnormal   Collection Time: 04/09/2020 11:48 PM   Specimen: Urine, Catheterized  Result Value Ref Range Status   Specimen Description   Final    URINE, CATHETERIZED Performed at [facility] [address], [place], [place] [address]    Special Requests   Final    NONE Performed at [facility], [place] [address] [place], [place] [address]    Culture MULTIPLE SPECIES PRESENT, SUGGEST RECOLLECTION (A)  Final   Report Status 03/21/2020 FINAL  Final  Culture, blood (Routine X 2) w Reflex to ID Panel     Status: Abnormal   Collection Time: 03/22/20  2:16 PM   Specimen: BLOOD RIGHT HAND  Result Value Ref Range Status   Specimen Description   Final    BLOOD RIGHT HAND Performed at [facility] [address], [place], [place] [address]    Special Requests   Final    BOTTLES DRAWN AEROBIC AND ANAEROBIC Blood Culture results may not be optimal due to an inadequate volume of blood received in culture bottles Performed at [facility] [address] [place], [place] [address]    Culture  Setup Time   Final    GRAM POSITIVE COCCI IN CLUSTERS IN BOTH AEROBIC AND ANAEROBIC BOTTLES CRITICAL RESULT CALLED TO, READ BACK BY AND VERIFIED WITH: PHARMD J GADHIA [id number] AT 723 AM BY CM CRITICAL VALUE NOTED.  VALUE IS CONSISTENT WITH PREVIOUSLY REPORTED AND CALLED VALUE.    Culture (A)  Final    STAPHYLOCOCCUS AUREUS SUSCEPTIBILITIES PERFORMED ON PREVIOUS CULTURE WITHIN THE LAST 5 DAYS. Performed at [facility], [place] [address] [place],  [address]    Report Status 03/25/2020 FINAL  Final  Surgical PCR screen     Status: Abnormal   Collection Time: 03/16/2020 10:08 AM   Specimen: Nasal Mucosa; Nasal  Swab  Result Value Ref Range Status   MRSA, PCR NEGATIVE NEGATIVE Final   Staphylococcus aureus POSITIVE (A) NEGATIVE Final    Comment: (NOTE) The Xpert SA Assay (FDA approved for NASAL specimens in patients 3 years of age and older), is one component of a comprehensive surveillance program. It is not intended to diagnose infection nor to guide or monitor treatment. Performed at [facility], [place] [address], [place], [place] [address]   Anaerobic culture     Status: None (Preliminary result)    Collection Time: 03/17/2020  1:42 PM   Specimen: PATH Other  Result Value Ref Range Status   Specimen Description   Final    FLUID  LEFT OLECRANON Performed at [facility] [address], [place], [place] [address]    Special Requests   Final    NONE Performed at [facility], [place] [address] [place], [place] [address]    Culture   Final    NO ANAEROBES ISOLATED; CULTURE IN PROGRESS FOR 5 DAYS   Report Status PENDING  Incomplete  Aerobic Culture (superficial specimen)     Status: None   Collection Time: 03/22/2020  1:42 PM   Specimen: PATH Other  Result Value Ref Range Status   Specimen Description   Final    FLUID  LEFT OLECRANON Performed at [facility], [place] [address], [place], [place] [address]    Special Requests   Final    NONE Performed at [facility], [place] [address] [place], [place] [address]    Gram Stain   Final    NO WBC SEEN RARE GRAM POSITIVE COCCI Performed at [facility], [place] [address], [place], [place] [address]    Culture FEW STAPHYLOCOCCUS AUREUS  Final   Report Status 03/26/2020 FINAL  Final   Organism ID, Bacteria STAPHYLOCOCCUS AUREUS  Final      Susceptibility   Staphylococcus aureus - MIC*    CIPROFLOXACIN <=0.5 SENSITIVE Sensitive     ERYTHROMYCIN <=0.25 SENSITIVE Sensitive     GENTAMICIN <=0.5 SENSITIVE Sensitive     OXACILLIN 0.5 SENSITIVE Sensitive     TETRACYCLINE <=1 SENSITIVE Sensitive     VANCOMYCIN 1 SENSITIVE Sensitive     TRIMETH/SULFA <=10 SENSITIVE Sensitive     CLINDAMYCIN <=0.25 SENSITIVE Sensitive     RIFAMPIN <=0.5 SENSITIVE Sensitive     Inducible Clindamycin NEGATIVE Sensitive     * FEW STAPHYLOCOCCUS AUREUS  Aerobic/Anaerobic Culture (surgical/deep wound)     Status: None (Preliminary result)   Collection Time: 04/03/2020  1:46 PM   Specimen: PATH Soft tissue  Result Value Ref Range Status   Specimen Description   Final    TISSUE LEFT  OLECRANON INFECTED Performed at [facility] [address], [place], [place] [address]    Special Requests   Final    NONE Performed at [facility], [place] [address], [place], [place] [address]    Gram Stain   Final    RARE WBC PRESENT, PREDOMINANTLY PMN RARE GRAM POSITIVE COCCI Performed at [facility], [place] [address] [place], [place] [address]    Culture   Final    FEW STAPHYLOCOCCUS AUREUS NO ANAEROBES ISOLATED; CULTURE IN PROGRESS FOR 5 DAYS    Report Status PENDING  Incomplete   Organism ID, Bacteria STAPHYLOCOCCUS AUREUS  Final      Susceptibility   Staphylococcus aureus - MIC*    CIPROFLOXACIN <=  0.5 SENSITIVE Sensitive     ERYTHROMYCIN <=0.25 SENSITIVE Sensitive     GENTAMICIN <=0.5 SENSITIVE Sensitive     OXACILLIN 0.5 SENSITIVE Sensitive     TETRACYCLINE <=1 SENSITIVE Sensitive     VANCOMYCIN 1 SENSITIVE Sensitive     TRIMETH/SULFA <=10 SENSITIVE Sensitive     CLINDAMYCIN <=0.25 SENSITIVE Sensitive     RIFAMPIN <=0.5 SENSITIVE Sensitive     Inducible Clindamycin NEGATIVE Sensitive     * FEW STAPHYLOCOCCUS AUREUS  Culture, blood (routine x 2)     Status: None (Preliminary result)   Collection Time: 03/16/2020 12:00 AM   Specimen: BLOOD  Result Value Ref Range Status   Specimen Description   Final    BLOOD RIGHT ANTECUBITAL Performed at [facility] [address] [place], [place] [address]    Special Requests   Final    BOTTLES DRAWN AEROBIC ONLY Blood Culture results may not be optimal due to an inadequate volume of blood received in culture bottles Performed at [facility] [address] [place], [place] [address]    Culture   Final    NO GROWTH 3 DAYS Performed at [facility], [place] [address], [place], [place] [address]    Report Status PENDING  Incomplete  Culture, blood (routine x 2)     Status: None (Preliminary result)   Collection Time: 04/05/2020 12:00 AM    Specimen: BLOOD LEFT HAND  Result Value Ref Range Status   Specimen Description   Final    BLOOD LEFT HAND Performed at [facility] [address], [place], [place] [address]    Special Requests   Final    BOTTLES DRAWN AEROBIC ONLY Blood Culture results may not be optimal due to an inadequate volume of blood received in culture bottles Performed at [facility] [address], [place], [place] [address]    Culture   Final    NO GROWTH 3 DAYS Performed at [facility], [place] [address], [place], [place] [address]    Report Status PENDING  Incomplete      Radiology Studies: [place] EKG SITE RITE  Result Date: 03/27/2020 If Site Rite image not attached, placement could not be confirmed due to current cardiac rhythm.     Scheduled Meds: . atorvastatin  40 mg Oral Daily  . docusate sodium  100 mg Oral BID  . fenofibrate  160 mg Oral Daily  . insulin aspart  0-15 Units Subcutaneous TID WC  . insulin aspart  0-5 Units Subcutaneous QHS  . insulin aspart  5 Units Subcutaneous TID WC  . insulin glargine  18 Units Subcutaneous QHS  . melatonin  3 mg Oral QHS  . mupirocin ointment  1 application Nasal BID   Continuous Infusions: .  ceFAZolin (ANCEF) IV 2 g (03/27/20 1300)     LOS: 6 days      Time spent: 25 minutes   Dessa Phi, DO [facility] 03/27/2020, 1:22 PM   Available via Epic secure chat 7am-7pm After these hours, please refer to coverage provider listed on [email or web address]

## 2020-03-27 NOTE — Progress Notes (Signed)
Blood consent obtained by patient. Placed in pts chart.

## 2020-03-27 NOTE — Progress Notes (Signed)
CRITICAL VALUE ALERT  Critical Value:  Hgb 6.7  Date & Time Notied:  03/27/2020 @ 6:35  Provider Notified: X. Blount  Orders Received/Actions taken: Waiting on new orders.

## 2020-03-27 NOTE — TOC Progression Note (Signed)
Transition of Care [facility]) - Progression Note    Patient Details  Name: [name] MRN: [id number] Date of Birth: 05-02-1955  [facility] of [facility] CM/SW Contact  Ross Ludwig,  Phone Number: 03/27/2020, 4:05 PM  Clinical Narrative:     Patient still does not have any bed offers, patient will need 6 weeks of IV antibiotics and is homeless.  CSW to continue to work on trying to find placement.   Expected Discharge Plan: Skilled Nursing Facility Barriers to Discharge: Continued Medical Work up  Expected Discharge Plan and Services Expected Discharge Plan: [place]   Discharge Planning Services: CM Consult                                           Social Determinants of Health (SDOH) Interventions    Readmission Risk Interventions No flowsheet data found.

## 2020-03-27 NOTE — Plan of Care (Signed)
  Problem: Education: Goal: Knowledge of General Education information will improve Description: Including pain rating scale, medication(s)/side effects and non-pharmacologic comfort measures Outcome: Not Progressing   

## 2020-03-27 NOTE — Progress Notes (Signed)
OT Cancellation Note  Patient Details Name: Deborah Figueroa MRN: [id number] DOB: May 15, 1955   Cancelled Treatment:    Reason Eval/Treat Not Completed: Medical issues which prohibited therapy. Patient with low HGB. Will f/u as able.  Aquila Menzie L Clinton Dragone 03/27/2020, 7:20 AM

## 2020-03-27 NOTE — Progress Notes (Signed)
OT Cancellation Note  Patient Details Name: Deborah Figueroa MRN: [id number] DOB: February 22, 1956   Cancelled Treatment:    Reason Eval/Treat Not Completed: Other (comment). Patient refused therapy today stating that she is getting antibiotics, getting blood (blood product had been completed), that she was just too tired. This is her second refusal. Therapy typically allows only 3 refusals before signing off.   Raymir Frommelt L Shaune Malacara 03/27/2020, 3:41 PM

## 2020-03-28 LAB — ANAEROBIC CULTURE

## 2020-03-28 LAB — CBC
HCT: 23.3 % — ABNORMAL LOW (ref 36.0–46.0)
Hemoglobin: 7.5 g/dL — ABNORMAL LOW (ref 12.0–15.0)
MCH: 27.3 pg (ref 26.0–34.0)
MCHC: 32.2 g/dL (ref 30.0–36.0)
MCV: 84.7 fL (ref 80.0–100.0)
Platelets: 236 10*3/uL (ref 150–400)
RBC: 2.75 MIL/uL — ABNORMAL LOW (ref 3.87–5.11)
RDW: 15.9 % — ABNORMAL HIGH (ref 11.5–15.5)
WBC: 16.9 10*3/uL — ABNORMAL HIGH (ref 4.0–10.5)
nRBC: 0 % (ref 0.0–0.2)

## 2020-03-28 LAB — BASIC METABOLIC PANEL
Anion gap: 15 (ref 5–15)
BUN: 42 mg/dL — ABNORMAL HIGH (ref 8–23)
CO2: 15 mmol/L — ABNORMAL LOW (ref 22–32)
Calcium: 8.8 mg/dL — ABNORMAL LOW (ref 8.9–10.3)
Chloride: 109 mmol/L (ref 98–111)
Creatinine, Ser: 1.76 mg/dL — ABNORMAL HIGH (ref 0.44–1.00)
GFR, Estimated: 32 mL/min — ABNORMAL LOW (ref >60)
Glucose, Bld: 238 mg/dL — ABNORMAL HIGH (ref 70–99)
Potassium: 3.1 mmol/L — ABNORMAL LOW (ref 3.5–5.1)
Sodium: 139 mmol/L (ref 135–145)

## 2020-03-28 LAB — TYPE AND SCREEN
ABO/RH(D): A POS
Antibody Screen: NEGATIVE
Unit division: 0

## 2020-03-28 LAB — AEROBIC/ANAEROBIC CULTURE W GRAM STAIN (SURGICAL/DEEP WOUND)

## 2020-03-28 LAB — BPAM RBC
Blood Product Expiration Date: 202201272359
ISSUE DATE / TIME: 202201141127
Unit Type and Rh: 6200

## 2020-03-28 LAB — GLUCOSE, CAPILLARY
Glucose-Capillary: 216 mg/dL — ABNORMAL HIGH (ref 70–99)
Glucose-Capillary: 230 mg/dL — ABNORMAL HIGH (ref 70–99)
Glucose-Capillary: 258 mg/dL — ABNORMAL HIGH (ref 70–99)
Glucose-Capillary: 215 mg/dL — ABNORMAL HIGH (ref 70–99)

## 2020-03-28 MED ORDER — POTASSIUM CHLORIDE CRYS ER 20 MEQ PO TBCR
40.0000 meq | EXTENDED_RELEASE_TABLET | ORAL | Status: AC
  Administered 2020-03-28 (×2): 40 meq via ORAL
  Filled 2020-03-28 (×2): qty 2

## 2020-03-28 NOTE — Progress Notes (Signed)
PROGRESS NOTE    Deborah Figueroa  ZOX:[id number] DOB: Jul 06, 1955 DOA: 04/03/2020 PCP: Ann Held, DO   Brief Narrative:  Deborah Figueroa is a 65 y.o. year old female with medical history significant for poorly controlled Type 2 Diabetes, chronic hypercalcemia presumed to be primary hyperparathyroidism, depression, HTN, HLD, recent history of recurrent falls requiring ED visits on 12/18 and 12/19 with no concerning findings on imaging at that time and treated supportively, as well as elevated calcium being evaluated as outpatient with concern for her likely hyperparathyroidism with plan to see surgeon on 1/12 who presented on 1/8 after having another fall in her hotel room. She was evaluated by EMS noted to have elevated CBG in the setting of increased thirst, polyuria and recently changed diabetic medications.   Last ED visit on 03/19/20 with XR of left elbow for evaluation of left elbow pain with recent fall and concerning for irregularity of the olecranon with possibility of underlying osteomyelitis and recommended MRI if clinically indicated. Lumbar spine XR, Right shoulder XR, and CT head at that time was negative for any acute findings except for periorbital hematoma.   In the ED, T-max 98.5, heart rate range 110-125, respiratory rate 24-30, initial blood pressure 140/84.  Glucose 650, CO2 13, calcium 13.3, anion gap 19, sodium 132.  UA unremarkable, beta hydroxybutyrate 6.21, pH 7.2 on VBG with CO2 35.7, COVID test negative, WBC 18, hemoglobin 7.7 (previous baseline 9.5 on 12/20).  Chest x-ray was nonacute.  Patient was started on IV insulin drip for management of DKA and type II diabetic as well as IV fluids for hypercalcemia. Hospital course complicated by blood cultures positive for MSSA and patient started on cefazolin, as well as worsening calcium greater than 15 for which patient was increased on IV fluids, given calcitonin and zoledronic acid.  New events last 24 hours /  Subjective: Patient had an episode of epistaxis earlier this morning, now resolved.  No other complaints today.  Assessment & Plan:   Principal Problem:   MSSA bacteremia Active Problems:   Hypercalcemia   Depression, major, single episode, moderate (HCC)   DKA (diabetic ketoacidosis) (HCC)   Acute pain of right shoulder   Left elbow pain   Acute kidney injury ([place])   Weakness   Recurrent falls   Sepsis due to methicillin susceptible Staphylococcus aureus (HCC)   Osteomyelitis of left elbow (HCC)   Osteomyelitis of left ulna (HCC)   Sepsis (HCC)   Sepsis secondary to MSSA bacteremia, septic chronic left olecranon bursitis with osteomyelitis associated with phlegmon and small abscess in the lower bicep muscle -MRI left elbow revealed osteomyelitis of the olecranon and proximal ulna, moderate joint effusion concerning for septic arthritis, proximal forearm flexor compartment intramuscular fluid collection concerning for abscess -CT forearm/humerus revealed osteomyelitis involving the olecranon with adjacent inflammatory phlegmon and abscess -Status post I&D of the elbow by orthopedic surgery 1/10 -follow-up with Dr. Tamera Punt in 1 week  -Surgical cultures showing staph aureus, pansensitive -Repeat blood culture 1/11 no growth to date -TEE negative for endocarditis -Appreciate infectious disease; discussed with Dr. Gale Journey.  He recommends 6 weeks of IV cefazolin.  However, patient has poor disposition, due to lack of insurance, she will be difficult to place in SNF, is homeless and unable to set up home health RN to help with IV antibiotics.  As an alternative, it is possible that patient could be on 4 weeks of IV antibiotics and then transition to 2 weeks of oral linezolid.  If patient remains in hospital in 3 to 4 weeks, could reengage with infectious disease for switching to linezolid at that time. -PICC line ordered  Severe hypercalcemia secondary to primary hyperparathyroidism -Has seen  endocrinology as an outpatient, referred for surgery -Calcium >15 on admission, status post bisphosphonate, Calcitonin, IVF -Previous hospitalist discussed with on-call ENT, did not recommend surgery currently for ongoing bacteremia.  Could call back for formal consult if calcium continues to worsen and bacteremia clears -Calcium remains normal  Weakness with recurrent falls -Likely secondary to ongoing hypercalcemia -MRI brain negative for any acute CNS involvement -PT eval recommending SNF placement  Type 2 diabetes mellitus with DKA -Hemoglobin A1c 10.4 -DKA now resolved -Continue Lantus, sliding scale insulin  AKI on CKD stage IIIb -Baseline creatinine 1.88 in July 2021 -Renal ultrasound unremarkable -AKI resolved, continue to hold home lisinopril/HCTZ  Acute on chronic normocytic anemia -Acutely worsened in setting of illness -No sign of acute blood loss -Status post 1 unit of packed blood cells 1/14  Hypertension -Continue to hold lisinopril/HCTZ.  Continue metoprolol  Hyperlipidemia -Continue Lipitor  Hypokalemia -Replace, trend  Depression -Continue Zoloft    DVT prophylaxis:  SCDs Start: 04/13/2020 1608  Code Status: Full code Family Communication: No family at bedside Disposition Plan:  Status is: Inpatient  Remains inpatient appropriate because:Unsafe d/c plan, IV treatments appropriate due to intensity of illness or inability to take PO and Inpatient level of care appropriate due to severity of illness   Dispo: The patient is from: Home              Anticipated d/c is to: To be determined              Anticipated d/c date is: > 3 days              Patient currently is not medically stable to d/c.  Continue IV antibiotics.  We will need to readdress her hypercalcemia and hyperparathyroidism with ENT prior to discharge to see if surgical intervention will be inpatient versus outpatient. Calcium level remains normal today.  Patient to receive IV antibiotics  for the next 4 to 6 weeks   Antimicrobials:  Anti-infectives (From admission, onward)   Start     Dose/Rate Route Frequency Ordered Stop   03/28/2020 1145  ceFAZolin (ANCEF) IVPB 2g/100 mL premix        2 g 200 mL/hr over 30 Minutes Intravenous Every 8 hours 04/06/2020 1055     03/26/2020 1100  nafcillin 12 g in sodium chloride 0.9 % 500 mL continuous infusion  Status:  Discontinued        12 g 20.8 mL/hr over 24 Hours Intravenous Every 24 hours 03/21/2020 0949 03/30/2020 1055   03/22/20 1500  nafcillin 2 g in sodium chloride 0.9 % 100 mL IVPB  Status:  Discontinued        2 g 200 mL/hr over 30 Minutes Intravenous Every 4 hours 03/22/20 1354 04/05/2020 0949   03/22/20 1345  nafcillin injection 2 g  Status:  Discontinued        2 g Intravenous Every 4 hours 03/22/20 1344 03/22/20 1354   03/22/20 1300  ceFAZolin (ANCEF) IVPB 2g/100 mL premix  Status:  Discontinued        2 g 200 mL/hr over 30 Minutes Intravenous Every 8 hours 03/22/20 1257 03/22/20 1344       Objective: Vitals:   03/27/20 1148 03/27/20 1426 03/27/20 2045 03/28/20 0547  BP: (!) 143/87 (!) 141/89 (!) 153/80 Marland Kitchen)  157/80  Pulse: (!) 103 (!) 102 84 (!) 101  Resp: 20 19 20 18   Temp: 98.4 F (36.9 C) 98.4 F (36.9 C) 98.8 F (37.1 C) 98.5 F (36.9 C)  TempSrc:  Oral Oral Oral  SpO2:   97% 98%  Weight:      Height:        Intake/Output Summary (Last 24 hours) at 03/28/2020 1059 Last data filed at 03/27/2020 1902 Gross per 24 hour  Intake 795.5 ml  Output 500 ml  Net 295.5 ml   Filed Weights   04/09/2020 0952  Weight: 93.9 kg    Examination: General exam: Appears calm and comfortable  Respiratory system: Clear to auscultation. Respiratory effort normal. Cardiovascular system: S1 & S2 heard, RRR. No pedal edema. Gastrointestinal system: Abdomen is nondistended, soft and nontender. Normal bowel sounds heard. Central nervous system: Alert and oriented. Non focal exam.  Extremities: Symmetric in appearance bilaterally   Psychiatry: Judgement and insight appear poor  Data Reviewed: I have personally reviewed following labs and imaging studies  CBC: Recent Labs  Lab 03/25/2020 0952 03/25/2020 0537 03/25/20 0539 03/26/20 0611 03/27/20 0605 03/27/20 1644 03/28/20 0705  WBC 18.4* 15.1* 11.2* 13.0* 15.6*  --  16.9*  NEUTROABS 17.5*  --   --   --   --   --   --   HGB 9.1* 8.0* 7.0* 7.1* 6.7* 7.8* 7.5*  HCT 28.7* 25.5* 22.3* 23.5* 21.8* 23.6* 23.3*  MCV 86.4 88.9 88.5 89.7 88.3  --  84.7  PLT 398 356 260 253 232  --  631   Basic Metabolic Panel: Recent Labs  Lab 03/27/2020 1132 03/25/20 0539 03/26/20 0611 03/26/20 1732 03/27/20 0605 03/28/20 0705  NA 144 141 138  --  139 139  K 3.3* 3.1* 3.4*  --  3.2* 3.1*  CL 111 109 107  --  109 109  CO2 22 19* 18*  --  19* 15*  GLUCOSE 269* 324* 339* 340* 309* 238*  BUN 42* 38* 35*  --  40* 42*  CREATININE 1.45* 1.63* 1.68*  --  1.69* 1.76*  CALCIUM 10.9* 9.7 9.4  --  9.0 8.8*  MG 1.5* 1.4* 1.7  --  1.8  --    GFR: Estimated Creatinine Clearance: 40.8 mL/min (A) (by C-G formula based on SCr of 1.76 mg/dL (H)). Liver Function Tests: Recent Labs  Lab 03/31/2020 0952  AST 14*  ALT 21  ALKPHOS 99  BILITOT 1.7*  PROT 6.2*  ALBUMIN 2.0*   No results for input(s): LIPASE, AMYLASE in the last 168 hours. No results for input(s): AMMONIA in the last 168 hours. Coagulation Profile: Recent Labs  Lab 03/24/20 1132  INR 1.3*   Cardiac Enzymes: No results for input(s): CKTOTAL, CKMB, CKMBINDEX, TROPONINI in the last 168 hours. BNP (last 3 results) No results for input(s): PROBNP in the last 8760 hours. HbA1C: No results for input(s): HGBA1C in the last 72 hours. CBG: Recent Labs  Lab 03/27/20 0743 03/27/20 1150 03/27/20 1749 03/27/20 2047 03/28/20 0811  GLUCAP 276* 217* 174* 129* 216*   Lipid Profile: No results for input(s): CHOL, HDL, LDLCALC, TRIG, CHOLHDL, LDLDIRECT in the last 72 hours. Thyroid Function Tests: No results for input(s): TSH,  T4TOTAL, FREET4, T3FREE, THYROIDAB in the last 72 hours. Anemia Panel: No results for input(s): VITAMINB12, FOLATE, FERRITIN, TIBC, IRON, RETICCTPCT in the last 72 hours. Sepsis Labs: Recent Labs  Lab 03/22/20 1430 03/22/20 1825  LATICACIDVEN 2.9* 1.6    Recent Results (from  the past 240 hour(s))  SARS CORONAVIRUS 2 (TAT 6-24 HRS) Nasopharyngeal Nasopharyngeal Swab     Status: None   Collection Time: 03/19/2020  2:13 PM   Specimen: Nasopharyngeal Swab  Result Value Ref Range Status   SARS Coronavirus 2 NEGATIVE NEGATIVE Final    Comment: (NOTE) SARS-CoV-2 target nucleic acids are NOT DETECTED.  The SARS-CoV-2 RNA is generally detectable in upper and lower respiratory specimens during the acute phase of infection. Negative results do not preclude SARS-CoV-2 infection, do not rule out co-infections with other pathogens, and should not be used as the sole basis for treatment or other patient management decisions. Negative results must be combined with clinical observations, patient history, and epidemiological information. The expected result is Negative.  Fact Sheet for Patients: SugarRoll.be  Fact Sheet for Healthcare Providers: https://www.woods-mathews.com/  This test is not yet approved or cleared by the [place] FDA and  has been authorized for detection and/or diagnosis of SARS-CoV-2 by FDA under an Emergency Use Authorization (EUA). This EUA will remain  in effect (meaning this test can be used) for the duration of the COVID-19 declaration under Se ction 564(b)(1) of the Act, 21 U.S.C. section 360bbb-3(b)(1), unless the authorization is terminated or revoked sooner.  Performed at [facility], [place] [address], [place], [place] [address]   Culture, blood (routine x 2)     Status: Abnormal   Collection Time: 03/16/2020  5:08 PM   Specimen: BLOOD  Result Value Ref Range Status   Specimen Description   Final    BLOOD  UNKNOWN Performed at [facility] [address], [place], [place] [address]    Special Requests   Final    BOTTLES DRAWN AEROBIC AND ANAEROBIC Blood Culture adequate volume Performed at [facility] [address] [place], [place] [address]    Culture  Setup Time   Final    GRAM POSITIVE COCCI IN BOTH AEROBIC AND ANAEROBIC BOTTLES CRITICAL RESULT CALLED TO, READ BACK BY AND VERIFIED WITH: PHARMD C.PIERCE AT 1200 ON 03/22/2020 BY T.SAAD Performed at [facility], [place] [address], [place], [place] [address]    Culture STAPHYLOCOCCUS AUREUS (A)  Final   Report Status 04/04/2020 FINAL  Final   Organism ID, Bacteria STAPHYLOCOCCUS AUREUS  Final      Susceptibility   Staphylococcus aureus - MIC*    CIPROFLOXACIN <=0.5 SENSITIVE Sensitive     ERYTHROMYCIN <=0.25 SENSITIVE Sensitive     GENTAMICIN <=0.5 SENSITIVE Sensitive     OXACILLIN 0.5 SENSITIVE Sensitive     TETRACYCLINE <=1 SENSITIVE Sensitive     VANCOMYCIN 1 SENSITIVE Sensitive     TRIMETH/SULFA <=10 SENSITIVE Sensitive     CLINDAMYCIN <=0.25 SENSITIVE Sensitive     RIFAMPIN <=0.5 SENSITIVE Sensitive     Inducible Clindamycin NEGATIVE Sensitive     * STAPHYLOCOCCUS AUREUS  Blood Culture ID Panel (Reflexed)     Status: Abnormal   Collection Time: 03/31/2020  5:08 PM  Result Value Ref Range Status   Enterococcus faecalis NOT DETECTED NOT DETECTED Final   Enterococcus Faecium NOT DETECTED NOT DETECTED Final   Listeria monocytogenes NOT DETECTED NOT DETECTED Final   Staphylococcus species DETECTED (A) NOT DETECTED Final    Comment: CRITICAL RESULT CALLED TO, READ BACK BY AND VERIFIED WITH: PHARMD C.PIERCE AT 1200 ON 03/22/2020 BY T.SAAD    Staphylococcus aureus (BCID) DETECTED (A) NOT DETECTED Final    Comment: CRITICAL RESULT CALLED TO, READ BACK BY AND  VERIFIED WITH: PHARMD C.PIERCE AT 1200 ON 03/22/2020 BY T.SAAD    Staphylococcus epidermidis NOT DETECTED NOT DETECTED Final    Staphylococcus lugdunensis NOT DETECTED NOT DETECTED Final   Streptococcus species NOT DETECTED NOT DETECTED Final   Streptococcus agalactiae NOT DETECTED NOT DETECTED Final   Streptococcus pneumoniae NOT DETECTED NOT DETECTED Final   Streptococcus pyogenes NOT DETECTED NOT DETECTED Final   A.calcoaceticus-baumannii NOT DETECTED NOT DETECTED Final   Bacteroides fragilis NOT DETECTED NOT DETECTED Final   Enterobacterales NOT DETECTED NOT DETECTED Final   Enterobacter cloacae complex NOT DETECTED NOT DETECTED Final   Escherichia coli NOT DETECTED NOT DETECTED Final   Klebsiella aerogenes NOT DETECTED NOT DETECTED Final   Klebsiella oxytoca NOT DETECTED NOT DETECTED Final   Klebsiella pneumoniae NOT DETECTED NOT DETECTED Final   Proteus species NOT DETECTED NOT DETECTED Final   Salmonella species NOT DETECTED NOT DETECTED Final   Serratia marcescens NOT DETECTED NOT DETECTED Final   Haemophilus influenzae NOT DETECTED NOT DETECTED Final   Neisseria meningitidis NOT DETECTED NOT DETECTED Final   Pseudomonas aeruginosa NOT DETECTED NOT DETECTED Final   Stenotrophomonas maltophilia NOT DETECTED NOT DETECTED Final   Candida albicans NOT DETECTED NOT DETECTED Final   Candida auris NOT DETECTED NOT DETECTED Final   Candida glabrata NOT DETECTED NOT DETECTED Final   Candida krusei NOT DETECTED NOT DETECTED Final   Candida parapsilosis NOT DETECTED NOT DETECTED Final   Candida tropicalis NOT DETECTED NOT DETECTED Final   Cryptococcus neoformans/gattii NOT DETECTED NOT DETECTED Final   Meth resistant mecA/C and MREJ NOT DETECTED NOT DETECTED Final    Comment: Performed at [facility], [address] [place], [place] [address]  Culture, Urine     Status: Abnormal   Collection Time: 03/29/2020 11:48 PM   Specimen: Urine, Catheterized  Result Value Ref Range Status   Specimen Description   Final    URINE, CATHETERIZED Performed at [facility] [address]  [place], [place] [address]    Special Requests   Final    NONE Performed at [facility], [place] [address], [place], [place] [address]    Culture MULTIPLE SPECIES PRESENT, SUGGEST RECOLLECTION (A)  Final   Report Status 03/26/2020 FINAL  Final  Culture, blood (Routine X 2) w Reflex to ID Panel     Status: Abnormal   Collection Time: 03/22/20  2:16 PM   Specimen: BLOOD RIGHT HAND  Result Value Ref Range Status   Specimen Description   Final    BLOOD RIGHT HAND Performed at [facility] [address], [place], [place] [address]    Special Requests   Final    BOTTLES DRAWN AEROBIC AND ANAEROBIC Blood Culture results may not be optimal due to an inadequate volume of blood received in culture bottles Performed at [facility] [address] [place], [place] [address]    Culture  Setup Time   Final    GRAM POSITIVE COCCI IN CLUSTERS IN BOTH AEROBIC AND ANAEROBIC BOTTLES CRITICAL RESULT CALLED TO, READ BACK BY AND VERIFIED WITH: PHARMD J GADHIA [id number] AT 723 AM BY CM CRITICAL VALUE NOTED.  VALUE IS CONSISTENT WITH PREVIOUSLY REPORTED AND CALLED VALUE.    Culture (A)  Final    STAPHYLOCOCCUS AUREUS SUSCEPTIBILITIES PERFORMED ON PREVIOUS CULTURE WITHIN THE LAST 5 DAYS. Performed at [facility], [place] [address] [place], [place] [address]    Report Status 03/25/2020 FINAL  Final  Surgical PCR screen     Status: Abnormal   Collection Time: 03/18/2020 10:08 AM   Specimen: Nasal Mucosa; Nasal Swab  Result Value Ref Range Status   MRSA, PCR NEGATIVE NEGATIVE Final   Staphylococcus aureus POSITIVE (A) NEGATIVE Final    Comment: (NOTE) The Xpert SA Assay (FDA approved for NASAL specimens in patients 74 years of age and older), is one component of a comprehensive surveillance program. It is not intended to diagnose infection nor to guide or monitor treatment. Performed at [facility], [place] [address] [place], [place] [address]   Anaerobic culture     Status: None (Preliminary result)   Collection Time: 03/22/2020  1:42 PM   Specimen: PATH Other  Result Value Ref Range Status   Specimen Description   Final    FLUID  LEFT OLECRANON Performed at [facility] [address], [place], [place] [address]    Special Requests   Final    NONE Performed at [facility], [place] [address], [place], [place] [address]    Culture   Final    NO ANAEROBES ISOLATED; CULTURE IN PROGRESS FOR 5 DAYS   Report Status PENDING  Incomplete  Aerobic Culture (superficial specimen)     Status: None   Collection Time: 04/09/2020  1:42 PM   Specimen: PATH Other  Result Value Ref Range Status   Specimen Description   Final    FLUID  LEFT OLECRANON Performed at [facility], [place] [address], [place], [place] [address]    Special Requests   Final    NONE Performed at [facility], [place] [address], [place], [place] [address]    Gram Stain   Final    NO WBC SEEN RARE GRAM POSITIVE COCCI Performed at [facility], [place] [address] [place], [place] [address]    Culture FEW STAPHYLOCOCCUS AUREUS  Final   Report Status 03/26/2020 FINAL  Final   Organism ID, Bacteria STAPHYLOCOCCUS AUREUS  Final      Susceptibility   Staphylococcus aureus - MIC*    CIPROFLOXACIN <=0.5 SENSITIVE Sensitive     ERYTHROMYCIN <=0.25 SENSITIVE Sensitive     GENTAMICIN <=0.5 SENSITIVE Sensitive     OXACILLIN 0.5 SENSITIVE Sensitive     TETRACYCLINE <=1 SENSITIVE Sensitive     VANCOMYCIN 1 SENSITIVE Sensitive     TRIMETH/SULFA <=10 SENSITIVE Sensitive     CLINDAMYCIN <=0.25 SENSITIVE Sensitive     RIFAMPIN <=0.5 SENSITIVE Sensitive     Inducible Clindamycin NEGATIVE Sensitive     * FEW STAPHYLOCOCCUS AUREUS  Aerobic/Anaerobic Culture (surgical/deep wound)     Status: None (Preliminary result)   Collection Time: 03/29/2020  1:46 PM   Specimen: PATH Soft  tissue  Result Value Ref Range Status   Specimen Description   Final    TISSUE LEFT OLECRANON INFECTED Performed at [facility] [address] [place], [place] [address]    Special Requests   Final    NONE Performed at [facility], [place] [address] [place], [place] [address]    Gram Stain   Final    RARE WBC PRESENT, PREDOMINANTLY PMN RARE GRAM POSITIVE COCCI Performed at [facility], [place] [address], [place], [place] [address]    Culture   Final    FEW STAPHYLOCOCCUS AUREUS NO ANAEROBES ISOLATED; CULTURE IN PROGRESS FOR 5 DAYS    Report Status PENDING  Incomplete   Organism  ID, Bacteria STAPHYLOCOCCUS AUREUS  Final      Susceptibility   Staphylococcus aureus - MIC*    CIPROFLOXACIN <=0.5 SENSITIVE Sensitive     ERYTHROMYCIN <=0.25 SENSITIVE Sensitive     GENTAMICIN <=0.5 SENSITIVE Sensitive     OXACILLIN 0.5 SENSITIVE Sensitive     TETRACYCLINE <=1 SENSITIVE Sensitive     VANCOMYCIN 1 SENSITIVE Sensitive     TRIMETH/SULFA <=10 SENSITIVE Sensitive     CLINDAMYCIN <=0.25 SENSITIVE Sensitive     RIFAMPIN <=0.5 SENSITIVE Sensitive     Inducible Clindamycin NEGATIVE Sensitive     * FEW STAPHYLOCOCCUS AUREUS  Culture, blood (routine x 2)     Status: None (Preliminary result)   Collection Time: 03/17/2020 12:00 AM   Specimen: BLOOD  Result Value Ref Range Status   Specimen Description   Final    BLOOD RIGHT ANTECUBITAL Performed at [facility] [address] [place], [place] [address]    Special Requests   Final    BOTTLES DRAWN AEROBIC ONLY Blood Culture results may not be optimal due to an inadequate volume of blood received in culture bottles Performed at [facility] [address], [place], [place] [address]    Culture   Final    NO GROWTH 3 DAYS Performed at [facility], [place] [address], [place], [place] [address]    Report Status PENDING  Incomplete  Culture, blood (routine  x 2)     Status: None (Preliminary result)   Collection Time: 03/31/2020 12:00 AM   Specimen: BLOOD LEFT HAND  Result Value Ref Range Status   Specimen Description   Final    BLOOD LEFT HAND Performed at [facility] [address], [place], [place] [address]    Special Requests   Final    BOTTLES DRAWN AEROBIC ONLY Blood Culture results may not be optimal due to an inadequate volume of blood received in culture bottles Performed at [facility] [address], [place], [place] [address]    Culture   Final    NO GROWTH 3 DAYS Performed at [facility], [place] [address], [place], [place] [address]    Report Status PENDING  Incomplete      Radiology Studies: [place] EKG SITE RITE  Result Date: 03/27/2020 If Site Rite image not attached, placement could not be confirmed due to current cardiac rhythm.     Scheduled Meds: . atorvastatin  40 mg Oral Daily  . docusate sodium  100 mg Oral BID  . fenofibrate  160 mg Oral Daily  . insulin aspart  0-15 Units Subcutaneous TID WC  . insulin aspart  0-5 Units Subcutaneous QHS  . insulin aspart  5 Units Subcutaneous TID WC  . insulin glargine  18 Units Subcutaneous QHS  . melatonin  3 mg Oral QHS  . metoprolol succinate  100 mg Oral Daily  . potassium chloride  40 mEq Oral Q4H  . sertraline  100 mg Oral Daily   Continuous Infusions: .  ceFAZolin (ANCEF) IV 2 g (03/28/20 0558)     LOS: 7 days      Time spent: 25 minutes   Dessa Phi, DO [facility] 03/28/2020, 10:59 AM   Available via Epic secure chat 7am-7pm After these hours, please refer to coverage provider listed on [email or web address]

## 2020-03-28 NOTE — Progress Notes (Signed)
Jennife Rn notified PICC may be placed Sunday.  No d/c date at this time.

## 2020-03-29 LAB — CBC WITH DIFFERENTIAL/PLATELET
Abs Immature Granulocytes: 0.29 10*3/uL — ABNORMAL HIGH (ref 0.00–0.07)
Basophils Absolute: 0 10*3/uL (ref 0.0–0.1)
Basophils Relative: 0 %
Eosinophils Absolute: 0 10*3/uL (ref 0.0–0.5)
Eosinophils Relative: 0 %
HCT: 24.1 % — ABNORMAL LOW (ref 36.0–46.0)
Hemoglobin: 7.8 g/dL — ABNORMAL LOW (ref 12.0–15.0)
Immature Granulocytes: 1 %
Lymphocytes Relative: 2 %
Lymphs Abs: 0.4 10*3/uL — ABNORMAL LOW (ref 0.7–4.0)
MCH: 27.3 pg (ref 26.0–34.0)
MCHC: 32.4 g/dL (ref 30.0–36.0)
MCV: 84.3 fL (ref 80.0–100.0)
Monocytes Absolute: 0.3 10*3/uL (ref 0.1–1.0)
Monocytes Relative: 2 %
Neutro Abs: 19.2 10*3/uL — ABNORMAL HIGH (ref 1.7–7.7)
Neutrophils Relative %: 95 %
Platelets: 350 10*3/uL (ref 150–400)
RBC: 2.86 MIL/uL — ABNORMAL LOW (ref 3.87–5.11)
RDW: 16.2 % — ABNORMAL HIGH (ref 11.5–15.5)
WBC: 20.2 10*3/uL — ABNORMAL HIGH (ref 4.0–10.5)
nRBC: 0 % (ref 0.0–0.2)

## 2020-03-29 LAB — CULTURE, BLOOD (ROUTINE X 2)
Culture: NO GROWTH
Culture: NO GROWTH

## 2020-03-29 LAB — CBC
HCT: 25.1 % — ABNORMAL LOW (ref 36.0–46.0)
Hemoglobin: 8 g/dL — ABNORMAL LOW (ref 12.0–15.0)
MCH: 27.2 pg (ref 26.0–34.0)
MCHC: 31.9 g/dL (ref 30.0–36.0)
MCV: 85.4 fL (ref 80.0–100.0)
Platelets: 305 10*3/uL (ref 150–400)
RBC: 2.94 MIL/uL — ABNORMAL LOW (ref 3.87–5.11)
RDW: 16.2 % — ABNORMAL HIGH (ref 11.5–15.5)
WBC: 19.6 10*3/uL — ABNORMAL HIGH (ref 4.0–10.5)
nRBC: 0 % (ref 0.0–0.2)

## 2020-03-29 LAB — BASIC METABOLIC PANEL
Anion gap: 18 — ABNORMAL HIGH (ref 5–15)
BUN: 47 mg/dL — ABNORMAL HIGH (ref 8–23)
CO2: 13 mmol/L — ABNORMAL LOW (ref 22–32)
Calcium: 9.1 mg/dL (ref 8.9–10.3)
Chloride: 111 mmol/L (ref 98–111)
Creatinine, Ser: 1.81 mg/dL — ABNORMAL HIGH (ref 0.44–1.00)
GFR, Estimated: 31 mL/min — ABNORMAL LOW (ref >60)
Glucose, Bld: 300 mg/dL — ABNORMAL HIGH (ref 70–99)
Potassium: 3.4 mmol/L — ABNORMAL LOW (ref 3.5–5.1)
Sodium: 142 mmol/L (ref 135–145)

## 2020-03-29 LAB — LACTIC ACID, PLASMA
Lactic Acid, Venous: 2.3 mmol/L (ref 0.5–1.9)
Lactic Acid, Venous: 2.2 mmol/L (ref 0.5–1.9)

## 2020-03-29 LAB — GLUCOSE, CAPILLARY
Glucose-Capillary: 188 mg/dL — ABNORMAL HIGH (ref 70–99)
Glucose-Capillary: 221 mg/dL — ABNORMAL HIGH (ref 70–99)
Glucose-Capillary: 280 mg/dL — ABNORMAL HIGH (ref 70–99)
Glucose-Capillary: 296 mg/dL — ABNORMAL HIGH (ref 70–99)
Glucose-Capillary: 316 mg/dL — ABNORMAL HIGH (ref 70–99)

## 2020-03-29 MED ORDER — FLUCONAZOLE 150 MG PO TABS
150.0000 mg | ORAL_TABLET | Freq: Once | ORAL | Status: DC
  Filled 2020-03-29: qty 1

## 2020-03-29 MED ORDER — POTASSIUM CHLORIDE CRYS ER 20 MEQ PO TBCR
40.0000 meq | EXTENDED_RELEASE_TABLET | Freq: Once | ORAL | Status: DC
  Filled 2020-03-29: qty 2

## 2020-03-29 MED ORDER — OXYMETAZOLINE HCL 0.05 % NA SOLN
3.0000 | Freq: Two times a day (BID) | NASAL | Status: AC | PRN
  Administered 2020-03-29: 3 via NASAL
  Filled 2020-03-29: qty 15

## 2020-03-29 MED ORDER — NYSTATIN 100000 UNIT/GM EX POWD
Freq: Three times a day (TID) | CUTANEOUS | Status: DC
  Administered 2020-03-30: 1 via TOPICAL
  Filled 2020-03-29 (×4): qty 15

## 2020-03-29 MED ORDER — INSULIN ASPART 100 UNIT/ML ~~LOC~~ SOLN
0.0000 [IU] | SUBCUTANEOUS | Status: DC
  Administered 2020-03-29: 13:00:00 8 [IU] via SUBCUTANEOUS
  Administered 2020-03-29: 11 [IU] via SUBCUTANEOUS
  Administered 2020-03-29: 5 [IU] via SUBCUTANEOUS
  Administered 2020-03-30 – 2020-03-31 (×11): 3 [IU] via SUBCUTANEOUS
  Administered 2020-04-01 – 2020-04-03 (×5): 2 [IU] via SUBCUTANEOUS
  Administered 2020-04-03: 3 [IU] via SUBCUTANEOUS
  Administered 2020-04-05 – 2020-04-07 (×7): 2 [IU] via SUBCUTANEOUS
  Administered 2020-04-09: 3 [IU] via SUBCUTANEOUS
  Administered 2020-04-10 (×2): 2 [IU] via SUBCUTANEOUS
  Administered 2020-04-11: 12:00:00 3 [IU] via SUBCUTANEOUS
  Administered 2020-04-11: 09:00:00 2 [IU] via SUBCUTANEOUS
  Administered 2020-04-11: 3 [IU] via SUBCUTANEOUS
  Administered 2020-04-11 – 2020-04-12 (×3): 2 [IU] via SUBCUTANEOUS
  Administered 2020-04-13: 3 [IU] via SUBCUTANEOUS
  Administered 2020-04-16 – 2020-04-17 (×2): 2 [IU] via SUBCUTANEOUS
  Administered 2020-04-17: 13:00:00 8 [IU] via SUBCUTANEOUS
  Administered 2020-04-17 – 2020-04-19 (×5): 2 [IU] via SUBCUTANEOUS
  Administered 2020-04-19: 3 [IU] via SUBCUTANEOUS
  Administered 2020-04-20 – 2020-04-22 (×6): 2 [IU] via SUBCUTANEOUS

## 2020-03-29 NOTE — Progress Notes (Signed)
CRITICAL VALUE ALERT  Critical Value:  LACTIC 2.3   Date & Time Notied:  03/29/20 1158  Provider Notified: CHOI  Orders Received/Actions taken:

## 2020-03-29 NOTE — Progress Notes (Signed)
Per Musician okay to hold off on PICC placement at this time per MD since unable to contact family.  Aware PICC will not be placed today.

## 2020-03-29 NOTE — Progress Notes (Signed)
PROGRESS NOTE    Deborah Figueroa  HQP:[id number] DOB: 15-Dec-1955 DOA: 03/18/2020 PCP: Ann Held, DO   Brief Narrative:  Deborah Figueroa is a 65 y.o. year old female with medical history significant for poorly controlled Type 2 Diabetes, chronic hypercalcemia presumed to be primary hyperparathyroidism, depression, HTN, HLD, recent history of recurrent falls requiring ED visits on 12/18 and 12/19 with no concerning findings on imaging at that time and treated supportively, as well as elevated calcium being evaluated as outpatient with concern for her likely hyperparathyroidism with plan to see surgeon on 1/12 who presented on 1/8 after having another fall in her hotel room. She was evaluated by EMS noted to have elevated CBG in the setting of increased thirst, polyuria and recently changed diabetic medications.   Last ED visit on 03/19/20 with XR of left elbow for evaluation of left elbow pain with recent fall and concerning for irregularity of the olecranon with possibility of underlying osteomyelitis and recommended MRI if clinically indicated. Lumbar spine XR, Right shoulder XR, and CT head at that time was negative for any acute findings except for periorbital hematoma.   In the ED, T-max 98.5, heart rate range 110-125, respiratory rate 24-30, initial blood pressure 140/84.  Glucose 650, CO2 13, calcium 13.3, anion gap 19, sodium 132.  UA unremarkable, beta hydroxybutyrate 6.21, pH 7.2 on VBG with CO2 35.7, COVID test negative, WBC 18, hemoglobin 7.7 (previous baseline 9.5 on 12/20).  Chest x-ray was nonacute.  Patient was started on IV insulin drip for management of DKA and type II diabetic as well as IV fluids for hypercalcemia. Hospital course complicated by blood cultures positive for MSSA and patient started on cefazolin, as well as worsening calcium greater than 15 for which patient was increased on IV fluids, given calcitonin and zoledronic acid.  New events last 24 hours /  Subjective: Episodes of confusion last night and this morning.  Patient is able to tell me that she is at [facility] but has very poor insight into her medical condition.  She states that she is leaving the hospital, has a ride waiting for her outside.  States that she needs to get out.  She is unable to accurately answer questions as far as her medical conditions during this hospital stay.  She denies any physical complaints such as worsening pain, left elbow pain, nausea vomiting diarrhea, abdominal pain, dysuria.  Assessment & Plan:   Principal Problem:   MSSA bacteremia Active Problems:   Hypercalcemia   Depression, major, single episode, moderate (HCC)   DKA (diabetic ketoacidosis) (HCC)   Acute pain of right shoulder   Left elbow pain   Acute kidney injury ([place])   Weakness   Recurrent falls   Sepsis due to methicillin susceptible Staphylococcus aureus ([place])   Osteomyelitis of left elbow (HCC)   Osteomyelitis of left ulna (HCC)   Sepsis (HCC)   Sepsis secondary to MSSA bacteremia, septic chronic left olecranon bursitis with osteomyelitis associated with phlegmon and small abscess in the lower bicep muscle -MRI left elbow revealed osteomyelitis of the olecranon and proximal ulna, moderate joint effusion concerning for septic arthritis, proximal forearm flexor compartment intramuscular fluid collection concerning for abscess -CT forearm/humerus revealed osteomyelitis involving the olecranon with adjacent inflammatory phlegmon and abscess -Status post I&D of the elbow by orthopedic surgery 1/10 -follow-up with Dr. Tamera Punt in 1 week  -Surgical cultures showing staph aureus, pansensitive -Repeat blood culture 1/11 no growth to date -TEE negative for  endocarditis -Appreciate infectious disease; discussed with Dr. Gale Journey.  He recommends 6 weeks of IV cefazolin.  However, patient has poor disposition, due to lack of insurance, she will be difficult to place in SNF, is homeless and  unable to set up home health RN to help with IV antibiotics.  As an alternative, it is possible that patient could be on 4 weeks of IV antibiotics and then transition to 2 weeks of oral linezolid.  If patient remains in hospital in 3 to 4 weeks, could reengage with infectious disease for switching to linezolid at that time. -PICC line ordered -WBC trending upward, check UA and urine culture, repeat blood culture  Acute metabolic encephalopathy -Change in examination over the last week, she is an alert and oriented but remains a very poor historian, does not seem to grasp what is going on or what she is being treated for in the hospital.  This is a change from my examination over the last week. -Check UA, urine culture to rule out UTI -Nonfocal exam to indicate any specific neurological deficit -Delirium precaution  Severe hypercalcemia secondary to primary hyperparathyroidism -Has seen endocrinology as an outpatient, referred for surgery -Calcium >15 on admission, status post bisphosphonate, Calcitonin, IVF -Previous hospitalist discussed with on-call ENT, did not recommend surgery currently for ongoing bacteremia.  Could call back for formal consult if calcium continues to worsen and bacteremia clears -Calcium remains normal  Weakness with recurrent falls -Likely secondary to ongoing hypercalcemia -MRI brain negative for any acute CNS involvement -PT eval recommending SNF placement  Type 2 diabetes mellitus with DKA -Hemoglobin A1c 10.4 -DKA now resolved -Continue Lantus, sliding scale insulin -?Back in DKA, has elevated anion gap and metabolic acidosis today. UA to check for ketones. Change SSI to q4h   AKI on CKD stage IIIb -Baseline creatinine 1.88 in July 2021 -Renal ultrasound unremarkable -AKI resolved, continue to hold home lisinopril/HCTZ  Acute on chronic normocytic anemia -Acutely worsened in setting of illness -No sign of acute blood loss -Status post 1 unit of packed  blood cells 1/14 -Remains stable today  Hypertension -Continue to hold lisinopril/HCTZ.  Continue metoprolol  Hyperlipidemia -Continue Lipitor  Hypokalemia -Replace, trend  Depression -Continue Zoloft    DVT prophylaxis:  SCDs Start: 03/28/2020 1608  Code Status: Full code Family Communication: No family at bedside Disposition Plan:  Status is: Inpatient  Remains inpatient appropriate because:Altered mental status, Unsafe d/c plan, IV treatments appropriate due to intensity of illness or inability to take PO and Inpatient level of care appropriate due to severity of illness   Dispo: The patient is from: Home              Anticipated d/c is to: To be determined              Anticipated d/c date is: > 3 days              Patient currently is not medically stable to d/c.  Continue IV antibiotics.  We will need to readdress her hypercalcemia and hyperparathyroidism with ENT prior to discharge to see if surgical intervention will be inpatient versus outpatient. Calcium level remains normal today.  Patient to receive IV antibiotics for the next 4 to 6 weeks.  Has episodes of confusion this morning   Antimicrobials:  Anti-infectives (From admission, onward)   Start     Dose/Rate Route Frequency Ordered Stop   03/29/2020 1145  ceFAZolin (ANCEF) IVPB 2g/100 mL premix  2 g 200 mL/hr over 30 Minutes Intravenous Every 8 hours 04/02/2020 1055     03/29/2020 1100  nafcillin 12 g in sodium chloride 0.9 % 500 mL continuous infusion  Status:  Discontinued        12 g 20.8 mL/hr over 24 Hours Intravenous Every 24 hours 03/17/2020 0949 03/22/2020 1055   03/22/20 1500  nafcillin 2 g in sodium chloride 0.9 % 100 mL IVPB  Status:  Discontinued        2 g 200 mL/hr over 30 Minutes Intravenous Every 4 hours 03/22/20 1354 04/05/2020 0949   03/22/20 1345  nafcillin injection 2 g  Status:  Discontinued        2 g Intravenous Every 4 hours 03/22/20 1344 03/22/20 1354   03/22/20 1300  ceFAZolin (ANCEF)  IVPB 2g/100 mL premix  Status:  Discontinued        2 g 200 mL/hr over 30 Minutes Intravenous Every 8 hours 03/22/20 1257 03/22/20 1344       Objective: Vitals:   03/27/20 2045 03/28/20 0547 03/28/20 2213 03/29/20 0702  BP: (!) 153/80 (!) 157/80 (!) 156/88 (!) 131/92  Pulse: 84 (!) 101 (!) 105 97  Resp: 20 18 18 15   Temp: 98.8 F (37.1 C) 98.5 F (36.9 C) 98.7 F (37.1 C)   TempSrc: Oral Oral Oral   SpO2: 97% 98% 99% 97%  Weight:      Height:       No intake or output data in the 24 hours ending 03/29/20 0952 Filed Weights   03/25/2020 0952  Weight: 93.9 kg    Examination: General exam: Appears calm and comfortable  Respiratory system: Clear to auscultation. Respiratory effort normal. Cardiovascular system: S1 & S2 heard, RRR. No pedal edema. Gastrointestinal system: Abdomen is nondistended, soft and nontender. Normal bowel sounds heard. Central nervous system: Alert and oriented to self and place, nonfocal exam Extremities: Symmetric in appearance bilaterally  Skin: No rashes, lesions or ulcers on exposed skin  Psychiatry: Judgement and insight appear poor, confused this morning   Data Reviewed: I have personally reviewed following labs and imaging studies  CBC: Recent Labs  Lab 03/16/2020 0952 04/05/2020 0537 03/25/20 0539 03/26/20 0611 03/27/20 0605 03/27/20 1644 03/28/20 0705 03/29/20 0518  WBC 18.4*   < > 11.2* 13.0* 15.6*  --  16.9* 19.6*  NEUTROABS 17.5*  --   --   --   --   --   --   --   HGB 9.1*   < > 7.0* 7.1* 6.7* 7.8* 7.5* 8.0*  HCT 28.7*   < > 22.3* 23.5* 21.8* 23.6* 23.3* 25.1*  MCV 86.4   < > 88.5 89.7 88.3  --  84.7 85.4  PLT 398   < > 260 253 232  --  236 305   < > = values in this interval not displayed.   Basic Metabolic Panel: Recent Labs  Lab 04/01/2020 1132 03/25/20 0539 03/26/20 0611 03/26/20 1732 03/27/20 0605 03/28/20 0705 03/29/20 0518  NA 144 141 138  --  139 139 142  K 3.3* 3.1* 3.4*  --  3.2* 3.1* 3.4*  CL 111 109 107  --   109 109 111  CO2 22 19* 18*  --  19* 15* 13*  GLUCOSE 269* 324* 339* 340* 309* 238* 300*  BUN 42* 38* 35*  --  40* 42* 47*  CREATININE 1.45* 1.63* 1.68*  --  1.69* 1.76* 1.81*  CALCIUM 10.9* 9.7 9.4  --  9.0  8.8* 9.1  MG 1.5* 1.4* 1.7  --  1.8  --   --    GFR: Estimated Creatinine Clearance: 39.7 mL/min (A) (by C-G formula based on SCr of 1.81 mg/dL (H)). Liver Function Tests: Recent Labs  Lab 03/25/2020 0952  AST 14*  ALT 21  ALKPHOS 99  BILITOT 1.7*  PROT 6.2*  ALBUMIN 2.0*   No results for input(s): LIPASE, AMYLASE in the last 168 hours. No results for input(s): AMMONIA in the last 168 hours. Coagulation Profile: Recent Labs  Lab 03/17/2020 1132  INR 1.3*   Cardiac Enzymes: No results for input(s): CKTOTAL, CKMB, CKMBINDEX, TROPONINI in the last 168 hours. BNP (last 3 results) No results for input(s): PROBNP in the last 8760 hours. HbA1C: No results for input(s): HGBA1C in the last 72 hours. CBG: Recent Labs  Lab 03/28/20 0811 03/28/20 1206 03/28/20 1653 03/28/20 2215 03/29/20 0750  GLUCAP 216* 230* 258* 215* 296*   Lipid Profile: No results for input(s): CHOL, HDL, LDLCALC, TRIG, CHOLHDL, LDLDIRECT in the last 72 hours. Thyroid Function Tests: No results for input(s): TSH, T4TOTAL, FREET4, T3FREE, THYROIDAB in the last 72 hours. Anemia Panel: No results for input(s): VITAMINB12, FOLATE, FERRITIN, TIBC, IRON, RETICCTPCT in the last 72 hours. Sepsis Labs: Recent Labs  Lab 03/22/20 1430 03/22/20 1825  LATICACIDVEN 2.9* 1.6    Recent Results (from the past 240 hour(s))  SARS CORONAVIRUS 2 (TAT 6-24 HRS) Nasopharyngeal Nasopharyngeal Swab     Status: None   Collection Time: 04/04/2020  2:13 PM   Specimen: Nasopharyngeal Swab  Result Value Ref Range Status   SARS Coronavirus 2 NEGATIVE NEGATIVE Final    Comment: (NOTE) SARS-CoV-2 target nucleic acids are NOT DETECTED.  The SARS-CoV-2 RNA is generally detectable in upper and lower respiratory specimens  during the acute phase of infection. Negative results do not preclude SARS-CoV-2 infection, do not rule out co-infections with other pathogens, and should not be used as the sole basis for treatment or other patient management decisions. Negative results must be combined with clinical observations, patient history, and epidemiological information. The expected result is Negative.  Fact Sheet for Patients: SugarRoll.be  Fact Sheet for Healthcare Providers: https://www.woods-mathews.com/  This test is not yet approved or cleared by the [place] FDA and  has been authorized for detection and/or diagnosis of SARS-CoV-2 by FDA under an Emergency Use Authorization (EUA). This EUA will remain  in effect (meaning this test can be used) for the duration of the COVID-19 declaration under Se ction 564(b)(1) of the Act, 21 U.S.C. section 360bbb-3(b)(1), unless the authorization is terminated or revoked sooner.  Performed at [facility], [place] [address], [place], [place] [address]   Culture, blood (routine x 2)     Status: Abnormal   Collection Time: 03/14/2020  5:08 PM   Specimen: BLOOD  Result Value Ref Range Status   Specimen Description   Final    BLOOD UNKNOWN Performed at [place] [address] [place], [place] [address]    Special Requests   Final    BOTTLES DRAWN AEROBIC AND ANAEROBIC Blood Culture adequate volume Performed at [place] [address], [place], [place] [address]    Culture  Setup Time   Final    GRAM POSITIVE COCCI IN BOTH AEROBIC AND ANAEROBIC BOTTLES CRITICAL RESULT CALLED TO, READ BACK BY AND VERIFIED WITH: PHARMD C.PIERCE AT 1200 ON 03/22/2020 BY T.SAAD Performed at [facility], [place] [address],  [place], [place] [address]    Culture STAPHYLOCOCCUS AUREUS (A)  Final   Report Status 03/28/2020 FINAL  Final   Organism ID, Bacteria STAPHYLOCOCCUS AUREUS   Final      Susceptibility   Staphylococcus aureus - MIC*    CIPROFLOXACIN <=0.5 SENSITIVE Sensitive     ERYTHROMYCIN <=0.25 SENSITIVE Sensitive     GENTAMICIN <=0.5 SENSITIVE Sensitive     OXACILLIN 0.5 SENSITIVE Sensitive     TETRACYCLINE <=1 SENSITIVE Sensitive     VANCOMYCIN 1 SENSITIVE Sensitive     TRIMETH/SULFA <=10 SENSITIVE Sensitive     CLINDAMYCIN <=0.25 SENSITIVE Sensitive     RIFAMPIN <=0.5 SENSITIVE Sensitive     Inducible Clindamycin NEGATIVE Sensitive     * STAPHYLOCOCCUS AUREUS  Blood Culture ID Panel (Reflexed)     Status: Abnormal   Collection Time: 04/04/2020  5:08 PM  Result Value Ref Range Status   Enterococcus faecalis NOT DETECTED NOT DETECTED Final   Enterococcus Faecium NOT DETECTED NOT DETECTED Final   Listeria monocytogenes NOT DETECTED NOT DETECTED Final   Staphylococcus species DETECTED (A) NOT DETECTED Final    Comment: CRITICAL RESULT CALLED TO, READ BACK BY AND VERIFIED WITH: PHARMD C.PIERCE AT 1200 ON 03/22/2020 BY T.SAAD    Staphylococcus aureus (BCID) DETECTED (A) NOT DETECTED Final    Comment: CRITICAL RESULT CALLED TO, READ BACK BY AND VERIFIED WITH: PHARMD C.PIERCE AT 1200 ON 03/22/2020 BY T.SAAD    Staphylococcus epidermidis NOT DETECTED NOT DETECTED Final   Staphylococcus lugdunensis NOT DETECTED NOT DETECTED Final   Streptococcus species NOT DETECTED NOT DETECTED Final   Streptococcus agalactiae NOT DETECTED NOT DETECTED Final   Streptococcus pneumoniae NOT DETECTED NOT DETECTED Final   Streptococcus pyogenes NOT DETECTED NOT DETECTED Final   A.calcoaceticus-baumannii NOT DETECTED NOT DETECTED Final   Bacteroides fragilis NOT DETECTED NOT DETECTED Final   Enterobacterales NOT DETECTED NOT DETECTED Final   Enterobacter cloacae complex NOT DETECTED NOT DETECTED Final   Escherichia coli NOT DETECTED NOT DETECTED Final   Klebsiella aerogenes NOT DETECTED NOT DETECTED Final   Klebsiella oxytoca NOT DETECTED NOT DETECTED Final   Klebsiella  pneumoniae NOT DETECTED NOT DETECTED Final   Proteus species NOT DETECTED NOT DETECTED Final   Salmonella species NOT DETECTED NOT DETECTED Final   Serratia marcescens NOT DETECTED NOT DETECTED Final   Haemophilus influenzae NOT DETECTED NOT DETECTED Final   Neisseria meningitidis NOT DETECTED NOT DETECTED Final   Pseudomonas aeruginosa NOT DETECTED NOT DETECTED Final   Stenotrophomonas maltophilia NOT DETECTED NOT DETECTED Final   Candida albicans NOT DETECTED NOT DETECTED Final   Candida auris NOT DETECTED NOT DETECTED Final   Candida glabrata NOT DETECTED NOT DETECTED Final   Candida krusei NOT DETECTED NOT DETECTED Final   Candida parapsilosis NOT DETECTED NOT DETECTED Final   Candida tropicalis NOT DETECTED NOT DETECTED Final   Cryptococcus neoformans/gattii NOT DETECTED NOT DETECTED Final   Meth resistant mecA/C and MREJ NOT DETECTED NOT DETECTED Final    Comment: Performed at [facility], [address], [place], [place] [address]  Culture, Urine     Status: Abnormal   Collection Time: 03/20/2020 11:48 PM   Specimen: Urine, Catheterized  Result Value Ref Range Status   Specimen Description   Final    URINE, CATHETERIZED Performed at [place] [address], [place], [place] [address]    Special Requests   Final    NONE Performed at [facility], [address] [place], [place] [address]    Culture MULTIPLE SPECIES PRESENT, SUGGEST RECOLLECTION (A)  Final   Report Status 03/21/2020 FINAL  Final  Culture, blood (Routine X 2) w Reflex to ID Panel     Status: Abnormal   Collection Time: 03/22/20  2:16 PM   Specimen: BLOOD RIGHT HAND  Result Value Ref Range Status   Specimen Description   Final    BLOOD RIGHT HAND Performed at [facility] [address] [place], [place] [address]    Special Requests   Final    BOTTLES DRAWN AEROBIC AND ANAEROBIC Blood Culture results may not be optimal due to an  inadequate volume of blood received in culture bottles Performed at [place] [address] [place], [place] [address]    Culture  Setup Time   Final    GRAM POSITIVE COCCI IN CLUSTERS IN BOTH AEROBIC AND ANAEROBIC BOTTLES CRITICAL RESULT CALLED TO, READ BACK BY AND VERIFIED WITH: PHARMD J GADHIA [id number] AT 723 AM BY CM CRITICAL VALUE NOTED.  VALUE IS CONSISTENT WITH PREVIOUSLY REPORTED AND CALLED VALUE.    Culture (A)  Final    STAPHYLOCOCCUS AUREUS SUSCEPTIBILITIES PERFORMED ON PREVIOUS CULTURE WITHIN THE LAST 5 DAYS. Performed at [facility], [place] [address] [place], [place] [address]    Report Status 03/25/2020 FINAL  Final  Surgical PCR screen     Status: Abnormal   Collection Time: 04/07/2020 10:08 AM   Specimen: Nasal Mucosa; Nasal Swab  Result Value Ref Range Status   MRSA, PCR NEGATIVE NEGATIVE Final   Staphylococcus aureus POSITIVE (A) NEGATIVE Final    Comment: (NOTE) The Xpert SA Assay (FDA approved for NASAL specimens in patients 41 years of age and older), is one component of a comprehensive surveillance program. It is not intended to diagnose infection nor to guide or monitor treatment. Performed at [facility], [place] [address], [place], [place] [address]   Anaerobic culture     Status: None   Collection Time: 03/22/2020  1:42 PM   Specimen: PATH Other  Result Value Ref Range Status   Specimen Description   Final    FLUID  LEFT OLECRANON Performed at [place] [address] [place], [place] [address]    Special Requests   Final    NONE Performed at [facility], [place] [address], [place], [place] [address]    Culture   Final    NO ANAEROBES ISOLATED Performed at [facility], [place] [address] [place], [place] [address]    Report Status 03/28/2020 FINAL  Final  Aerobic Culture (superficial specimen)     Status: None   Collection Time: 03/22/2020  1:42 PM    Specimen: PATH Other  Result Value Ref Range Status   Specimen Description   Final    FLUID  LEFT OLECRANON Performed at [place] [address] [place], [place] [address]    Special Requests   Final    NONE Performed at [facility], [place] [address], [place], [place] [address]    Gram Stain   Final    NO WBC SEEN RARE GRAM POSITIVE COCCI Performed at [facility], [place] [address] [place], [place] [address]    Culture FEW STAPHYLOCOCCUS AUREUS  Final   Report Status 03/26/2020 FINAL  Final   Organism ID, Bacteria STAPHYLOCOCCUS AUREUS  Final      Susceptibility  Staphylococcus aureus - MIC*    CIPROFLOXACIN <=0.5 SENSITIVE Sensitive     ERYTHROMYCIN <=0.25 SENSITIVE Sensitive     GENTAMICIN <=0.5 SENSITIVE Sensitive     OXACILLIN 0.5 SENSITIVE Sensitive     TETRACYCLINE <=1 SENSITIVE Sensitive     VANCOMYCIN 1 SENSITIVE Sensitive     TRIMETH/SULFA <=10 SENSITIVE Sensitive     CLINDAMYCIN <=0.25 SENSITIVE Sensitive     RIFAMPIN <=0.5 SENSITIVE Sensitive     Inducible Clindamycin NEGATIVE Sensitive     * FEW STAPHYLOCOCCUS AUREUS  Aerobic/Anaerobic Culture (surgical/deep wound)     Status: None   Collection Time: 04/02/2020  1:46 PM   Specimen: PATH Soft tissue  Result Value Ref Range Status   Specimen Description   Final    TISSUE LEFT OLECRANON INFECTED Performed at [place] [address] [place], [place] [address]    Special Requests   Final    NONE Performed at [facility], [place] [address] [place], [place] [address]    Gram Stain   Final    RARE WBC PRESENT, PREDOMINANTLY PMN RARE GRAM POSITIVE COCCI    Culture   Final    FEW STAPHYLOCOCCUS AUREUS NO ANAEROBES ISOLATED Performed at [facility], [place] [address], [place], [place] [address]    Report Status 03/28/2020 FINAL  Final   Organism ID, Bacteria STAPHYLOCOCCUS AUREUS  Final      Susceptibility    Staphylococcus aureus - MIC*    CIPROFLOXACIN <=0.5 SENSITIVE Sensitive     ERYTHROMYCIN <=0.25 SENSITIVE Sensitive     GENTAMICIN <=0.5 SENSITIVE Sensitive     OXACILLIN 0.5 SENSITIVE Sensitive     TETRACYCLINE <=1 SENSITIVE Sensitive     VANCOMYCIN 1 SENSITIVE Sensitive     TRIMETH/SULFA <=10 SENSITIVE Sensitive     CLINDAMYCIN <=0.25 SENSITIVE Sensitive     RIFAMPIN <=0.5 SENSITIVE Sensitive     Inducible Clindamycin NEGATIVE Sensitive     * FEW STAPHYLOCOCCUS AUREUS  Culture, blood (routine x 2)     Status: None   Collection Time: 04/10/2020 12:00 AM   Specimen: BLOOD  Result Value Ref Range Status   Specimen Description   Final    BLOOD RIGHT ANTECUBITAL Performed at [facility] [address], [place], [place] [address]    Special Requests   Final    BOTTLES DRAWN AEROBIC ONLY Blood Culture results may not be optimal due to an inadequate volume of blood received in culture bottles Performed at [facility] [address], [place], [place] [address]    Culture   Final    NO GROWTH 5 DAYS Performed at [facility], [place] [address], [place], [place] [address]    Report Status 03/29/2020 FINAL  Final  Culture, blood (routine x 2)     Status: None   Collection Time: 03/18/2020 12:00 AM   Specimen: BLOOD LEFT HAND  Result Value Ref Range Status   Specimen Description   Final    BLOOD LEFT HAND Performed at [facility] [address], [place], [place] [address]    Special Requests   Final    BOTTLES DRAWN AEROBIC ONLY Blood Culture results may not be optimal due to an inadequate volume of blood received in culture bottles Performed at [place] [address] [place],  [address]    Culture   Final    NO GROWTH 5 DAYS Performed at [facility], [address] [place], [place] [address]    Report Status 03/29/2020 FINAL  Final      Radiology Studies: No results  found.    Scheduled Meds: . atorvastatin  40 mg Oral Daily  . docusate sodium  100 mg Oral BID  . fenofibrate  160 mg Oral Daily  . insulin aspart  0-15 Units Subcutaneous TID WC  . insulin aspart  0-5 Units Subcutaneous QHS  . insulin aspart  5 Units Subcutaneous TID WC  . insulin glargine  18 Units Subcutaneous QHS  . melatonin  3 mg Oral QHS  . metoprolol succinate  100 mg Oral Daily  . potassium chloride  40 mEq Oral Once  . sertraline  100 mg Oral Daily   Continuous Infusions: .  ceFAZolin (ANCEF) IV 2 g (03/29/20 0534)     LOS: 8 days      Time spent: 30 minutes   Dessa Phi, DO [facility] 03/29/2020, 9:52 AM   Available via Epic secure chat 7am-7pm After these hours, please refer to coverage provider listed on [email or web address]

## 2020-03-29 NOTE — Progress Notes (Signed)
Attempted to contact family for PICC consent.  Unable to reach any listed, attempted all phone numbers.  Musician aware and to notify MD.  Pt has adequate IV acces at this time, PICC ordered for OPAT, no pending discharge today.

## 2020-03-30 ENCOUNTER — Inpatient Hospital Stay (HOSPITAL_COMMUNITY): Payer: Medicaid Other

## 2020-03-30 ENCOUNTER — Encounter (HOSPITAL_COMMUNITY): Payer: Self-pay | Admitting: Internal Medicine

## 2020-03-30 LAB — URINALYSIS, ROUTINE W REFLEX MICROSCOPIC
Bilirubin Urine: NEGATIVE
Glucose, UA: 150 mg/dL — AB
Ketones, ur: 5 mg/dL — AB
Nitrite: NEGATIVE
Protein, ur: 100 mg/dL — AB
RBC / HPF: >50 RBC/hpf — ABNORMAL HIGH (ref 0–5)
Specific Gravity, Urine: 1.02 (ref 1.005–1.030)
WBC, UA: >50 WBC/hpf — ABNORMAL HIGH (ref 0–5)
pH: 5 (ref 5.0–8.0)

## 2020-03-30 LAB — GLUCOSE, CAPILLARY
Glucose-Capillary: 171 mg/dL — ABNORMAL HIGH (ref 70–99)
Glucose-Capillary: 155 mg/dL — ABNORMAL HIGH (ref 70–99)
Glucose-Capillary: 161 mg/dL — ABNORMAL HIGH (ref 70–99)
Glucose-Capillary: 142 mg/dL — ABNORMAL HIGH (ref 70–99)
Glucose-Capillary: 161 mg/dL — ABNORMAL HIGH (ref 70–99)

## 2020-03-30 LAB — CBC
HCT: 24.4 % — ABNORMAL LOW (ref 36.0–46.0)
Hemoglobin: 8.2 g/dL — ABNORMAL LOW (ref 12.0–15.0)
MCH: 28.3 pg (ref 26.0–34.0)
MCHC: 33.6 g/dL (ref 30.0–36.0)
MCV: 84.1 fL (ref 80.0–100.0)
Platelets: 322 10*3/uL (ref 150–400)
RBC: 2.9 MIL/uL — ABNORMAL LOW (ref 3.87–5.11)
RDW: 16.8 % — ABNORMAL HIGH (ref 11.5–15.5)
WBC: 20.5 10*3/uL — ABNORMAL HIGH (ref 4.0–10.5)
nRBC: 0.4 % — ABNORMAL HIGH (ref 0.0–0.2)

## 2020-03-30 LAB — BASIC METABOLIC PANEL
Anion gap: 15 (ref 5–15)
BUN: 49 mg/dL — ABNORMAL HIGH (ref 8–23)
CO2: 19 mmol/L — ABNORMAL LOW (ref 22–32)
Calcium: 9.6 mg/dL (ref 8.9–10.3)
Chloride: 115 mmol/L — ABNORMAL HIGH (ref 98–111)
Creatinine, Ser: 1.73 mg/dL — ABNORMAL HIGH (ref 0.44–1.00)
GFR, Estimated: 33 mL/min — ABNORMAL LOW (ref >60)
Glucose, Bld: 172 mg/dL — ABNORMAL HIGH (ref 70–99)
Potassium: 3.3 mmol/L — ABNORMAL LOW (ref 3.5–5.1)
Sodium: 149 mmol/L — ABNORMAL HIGH (ref 135–145)

## 2020-03-30 LAB — LIPASE, BLOOD: Lipase: 60 U/L — ABNORMAL HIGH (ref 11–51)

## 2020-03-30 MED ORDER — PIPERACILLIN-TAZOBACTAM 3.375 G IVPB
3.3750 g | Freq: Three times a day (TID) | INTRAVENOUS | Status: DC
  Administered 2020-03-30 – 2020-03-31 (×4): 3.375 g via INTRAVENOUS
  Filled 2020-03-30 (×4): qty 50

## 2020-03-30 MED ORDER — KCL IN DEXTROSE-NACL 20-5-0.45 MEQ/L-%-% IV SOLN
INTRAVENOUS | Status: DC
  Filled 2020-03-30 (×3): qty 1000

## 2020-03-30 NOTE — Plan of Care (Signed)
  Problem: Coping: Goal: Ability to adjust to condition or change in health will improve Outcome: Not Progressing   

## 2020-03-30 NOTE — Progress Notes (Signed)
Spoke with Joelene Millin RN, she is aware PICC may not be placed today. Patient is not for discharge at this time. Will continue to monitor.

## 2020-03-30 NOTE — Progress Notes (Signed)
Pharmacy Antibiotic Note  Deborah Figueroa is a 65 y.o. female admitted on 04/01/2020 found to have MSSA bacteremia w/ associated left elbow olecronon bursitis, currently on day #9 of Nafcillin>Cefazolin.  Pharmacy has been consulted for escalation to Zosyn dosing for cavitary pneumonia.  Plan: Zosyn 3.375g IV Q8H infused over 4hrs. Follow up renal function, culture results, and clinical course.   Height: 5\' 11"  (180.3 cm) Weight: 93.9 kg (207 lb) (taken from previous charting) IBW/kg (Calculated) : 70.8  Temp (24hrs), Avg:98.1 F (36.7 C), Min:97.5 F (36.4 C), Max:98.6 F (37 C)  Recent Labs  Lab 03/26/20 0611 03/27/20 0605 03/28/20 0705 03/29/20 0518 03/29/20 1051 03/29/20 1257 03/29/20 2303 03/30/20 0507  WBC 13.0* 15.6* 16.9* 19.6*  --   --  20.2* 20.5*  CREATININE 1.68* 1.69* 1.76* 1.81*  --   --   --  1.73*  LATICACIDVEN  --   --   --   --  2.3* 2.2*  --   --     Estimated Creatinine Clearance: 41.5 mL/min (A) (by C-G formula based on SCr of 1.73 mg/dL (H)).    No Known Allergies  Antimicrobials this admission: 1/9 Nafcillin >> 1/10 1/10 Cefazolin>> 1/17 1/17 Zosyn >>   Dose adjustments this admission:  Microbiology results: 1/8 BCx: MSSA 1/9:  BCx: 2/2 show GPC - will not run BCID - assumed to be same as previous culture per lab  1/10 fluid left olecranaon: MSSA 1/11 BCx: NGF 1/16 BCx: ngtd  Thank you for allowing pharmacy to be a part of this patient's care.  Gretta Arab PharmD, BCPS Clinical Pharmacist [facility] main pharmacy [phone] 03/30/2020 10:09 AM

## 2020-03-30 NOTE — Progress Notes (Signed)
PROGRESS NOTE    Deborah Figueroa  VZD:[id number] DOB: Sep 27, 1955 DOA: 03/27/2020 PCP: Ann Held, DO   Brief Narrative:  Deborah Figueroa is a 65 y.o. year old female with medical history significant for poorly controlled Type 2 Diabetes, chronic hypercalcemia presumed to be primary hyperparathyroidism, depression, HTN, HLD, recent history of recurrent falls requiring ED visits on 12/18 and 12/19 with no concerning findings on imaging at that time and treated supportively, as well as elevated calcium being evaluated as outpatient with concern for her likely hyperparathyroidism with plan to see surgeon on 1/12 who presented on 1/8 after having another fall in her hotel room. She was evaluated by EMS noted to have elevated CBG in the setting of increased thirst, polyuria and recently changed diabetic medications.   Last ED visit on 03/19/20 with XR of left elbow for evaluation of left elbow pain with recent fall and concerning for irregularity of the olecranon with possibility of underlying osteomyelitis and recommended MRI if clinically indicated. Lumbar spine XR, Right shoulder XR, and CT head at that time was negative for any acute findings except for periorbital hematoma.   In the ED, T-max 98.5, heart rate range 110-125, respiratory rate 24-30, initial blood pressure 140/84.  Glucose 650, CO2 13, calcium 13.3, anion gap 19, sodium 132.  UA unremarkable, beta hydroxybutyrate 6.21, pH 7.2 on VBG with CO2 35.7, COVID test negative, WBC 18, hemoglobin 7.7 (previous baseline 9.5 on 12/20).  Chest x-ray was nonacute.  Patient was started on IV insulin drip for management of DKA and type II diabetic as well as IV fluids for hypercalcemia. Hospital course complicated by blood cultures positive for MSSA and patient started on cefazolin, as well as worsening calcium greater than 15 for which patient was increased on IV fluids, given calcitonin and zoledronic acid.  New events last 24 hours /  Subjective: Today her confusion seems to be resolved.  She is able to answer that she is at [facility], year "22" and tells me that she is here because of an infection.  However her insight continues to remain poor.  She denies any physical complaints other than being very thirsty.  Her oxygen saturation dropped to 87% on room air earlier this morning.  Per nursing staff, patient had episodes of epistasis, resolved with Afrin  Assessment & Plan:   Principal Problem:   MSSA bacteremia Active Problems:   Hypercalcemia   Depression, major, single episode, moderate (HCC)   DKA (diabetic ketoacidosis) ([place])   Acute pain of right shoulder   Left elbow pain   Acute kidney injury ([place])   Weakness   Recurrent falls   Sepsis due to methicillin susceptible Staphylococcus aureus ([place])   Osteomyelitis of left elbow (HCC)   Osteomyelitis of left ulna (HCC)   Sepsis ([place])   Sepsis secondary to MSSA bacteremia, septic chronic left olecranon bursitis with osteomyelitis associated with phlegmon and small abscess in the lower bicep muscle -MRI left elbow revealed osteomyelitis of the olecranon and proximal ulna, moderate joint effusion concerning for septic arthritis, proximal forearm flexor compartment intramuscular fluid collection concerning for abscess -CT forearm/humerus revealed osteomyelitis involving the olecranon with adjacent inflammatory phlegmon and abscess -Status post I&D of the elbow by orthopedic surgery 1/10 -follow-up with Dr. Tamera Punt in 1 week  -Surgical cultures showing staph aureus, pansensitive -Repeat blood culture 1/11 no growth to date -TEE negative for endocarditis -Appreciate infectious disease; discussed with Dr. Gale Journey.  He recommends 6 weeks of IV  cefazolin.  However, patient has poor disposition, due to lack of insurance, she will be difficult to place in SNF, is homeless and unable to set up home health RN to help with IV antibiotics.  As an alternative, it is  possible that patient could be on 4 weeks of IV antibiotics and then transition to 2 weeks of oral linezolid.  If patient remains in hospital in 3 to 4 weeks, could reengage with infectious disease for switching to linezolid at that time. 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, potentially with chest wall invasion as well as bilateral lower lobe infiltrates, no evidence of cavitary pneumonia/lung abscess, ill-defined peripancreatic tissue, left thyroid nodule -Broadened antibiotics to zosyn for pneumonia, in setting of worsening leukocytosis  -Once clinically stable, she will need to address right apical lung mass as well as thyroid nodule  Acute metabolic encephalopathy -Check UA, urine culture to rule out UTI -Nonfocal exam to indicate any specific neurological deficit -Delirium precaution -Improved since yesterday, now oriented to self, year, place, situation   Hypernatremia -Start D5 half-normal saline and trend BMP  Hypokalemia -Replace, trend  Severe hypercalcemia secondary to primary hyperparathyroidism -Has seen endocrinology as an outpatient, referred for surgery -Calcium >15 on admission, status post bisphosphonate, Calcitonin, IVF -Previous hospitalist discussed with on-call ENT, did not recommend surgery currently for ongoing bacteremia.  Could call back for formal consult if calcium continues to worsen and bacteremia clears -Calcium remains normal  Weakness with recurrent falls -Likely secondary to ongoing hypercalcemia -MRI brain negative for any acute CNS  involvement -PT eval recommending SNF placement  Type 2 diabetes mellitus with DKA -Hemoglobin A1c 10.4 -DKA now resolved -Continue Lantus, sliding scale insulin  AKI on CKD stage IIIb -Baseline creatinine 1.88 in July 2021 -Renal ultrasound unremarkable -AKI resolved, continue to hold home lisinopril/HCTZ  Acute on chronic normocytic anemia -Acutely worsened in setting of illness -No sign of acute blood loss -Status post 1 unit of packed blood cells 1/14 -Remains stable today  Hypertension -Continue to hold lisinopril/HCTZ.  Continue metoprolol  Hyperlipidemia -Continue Lipitor  Depression -Continue Zoloft    DVT prophylaxis:  SCDs Start: 03/28/2020 1608  Code Status: Full code Family Communication: No family at bedside Disposition Plan:  Status is: Inpatient  Remains inpatient appropriate because:Persistent severe electrolyte disturbances, Altered mental status, Unsafe d/c plan, IV treatments appropriate due to intensity of illness or inability to take PO and Inpatient level of care appropriate due to severity of illness   Dispo: The patient is from: Home              Anticipated d/c is to: To be determined              Anticipated d/c date is: > 3 days              Patient currently is not medically stable to d/c.  Continue IV antibiotics.  We will need to readdress her hypercalcemia and hyperparathyroidism with ENT prior to discharge to see if surgical intervention will be inpatient versus outpatient. Calcium level remains normal today.  Patient to receive IV antibiotics for the next 4  to 6 weeks.  Now with developing pneumonia, hyponatremia, hypokalemia   Antimicrobials:  Anti-infectives (From admission, onward)   Start     Dose/Rate Route Frequency Ordered Stop   03/30/20 1030  piperacillin-tazobactam (ZOSYN) IVPB 3.375 g        3.375 g 12.5 mL/hr over 240 Minutes Intravenous Every 8 hours 03/30/20 1006     03/29/20 1300  fluconazole (DIFLUCAN) tablet 150 mg         150 mg Oral  Once 03/29/20 1201     03/22/2020 1145  ceFAZolin (ANCEF) IVPB 2g/100 mL premix  Status:  Discontinued        2 g 200 mL/hr over 30 Minutes Intravenous Every 8 hours 03/14/2020 1055 03/30/20 0951   04/03/2020 1100  nafcillin 12 g in sodium chloride 0.9 % 500 mL continuous infusion  Status:  Discontinued        12 g 20.8 mL/hr over 24 Hours Intravenous Every 24 hours 03/15/2020 0949 03/17/2020 1055   03/22/20 1500  nafcillin 2 g in sodium chloride 0.9 % 100 mL IVPB  Status:  Discontinued        2 g 200 mL/hr over 30 Minutes Intravenous Every 4 hours 03/22/20 1354 04/12/2020 0949   03/22/20 1345  nafcillin injection 2 g  Status:  Discontinued        2 g Intravenous Every 4 hours 03/22/20 1344 03/22/20 1354   03/22/20 1300  ceFAZolin (ANCEF) IVPB 2g/100 mL premix  Status:  Discontinued        2 g 200 mL/hr over 30 Minutes Intravenous Every 8 hours 03/22/20 1257 03/22/20 1344       Objective: Vitals:   03/29/20 1754 03/29/20 2015 03/29/20 2345 03/30/20 0357  BP:  (!) 151/90 134/82 (!) 152/72  Pulse:  (!) 107 (!) 103 (!) 107  Resp: 18 16 20 20   Temp:  97.9 F (36.6 C) 98.2 F (36.8 C) (!) 97.5 F (36.4 C)  TempSrc:   Oral Oral  SpO2:  98% 97% (!) 87%  Weight:      Height:        Intake/Output Summary (Last 24 hours) at 03/30/2020 1134 Last data filed at 03/30/2020 0600 Gross per 24 hour  Intake 320 ml  Output 750 ml  Net -430 ml   Filed Weights   03/28/2020 0952  Weight: 93.9 kg    Examination: General exam: Appears calm and comfortable  Respiratory system: Clear to auscultation. Respiratory effort normal.  On room air Cardiovascular system: S1 & S2 heard, RRR. No pedal edema. Gastrointestinal system: Abdomen is nondistended, soft and nontender. Normal bowel sounds heard. Central nervous system: Alert and oriented. Non focal exam. Speech clear  Extremities: Symmetric in appearance bilaterally  Skin: No rashes, lesions or ulcers on exposed skin, dry blood around  nares.  Left elbow with stitches in place, without erythema or drainage Psychiatry: Judgement and insight appear poor   Data Reviewed: I have personally reviewed following labs and imaging studies  CBC: Recent Labs  Lab 03/27/20 0605 03/27/20 1644 03/28/20 0705 03/29/20 0518 03/29/20 2303 03/30/20 0507  WBC 15.6*  --  16.9* 19.6* 20.2* 20.5*  NEUTROABS  --   --   --   --  19.2*  --   HGB 6.7* 7.8* 7.5* 8.0* 7.8* 8.2*  HCT 21.8* 23.6* 23.3* 25.1* 24.1* 24.4*  MCV 88.3  --  84.7 85.4 84.3 84.1  PLT 232  --  236 305 350 578   Basic Metabolic Panel: Recent  Labs  Lab 03/28/2020 1132 03/25/20 0539 03/26/20 6295 03/26/20 1732 03/27/20 0605 03/28/20 0705 03/29/20 0518 03/30/20 0507  NA 144 141 138  --  139 139 142 149*  K 3.3* 3.1* 3.4*  --  3.2* 3.1* 3.4* 3.3*  CL 111 109 107  --  109 109 111 115*  CO2 22 19* 18*  --  19* 15* 13* 19*  GLUCOSE 269* 324* 339* 340* 309* 238* 300* 172*  BUN 42* 38* 35*  --  40* 42* 47* 49*  CREATININE 1.45* 1.63* 1.68*  --  1.69* 1.76* 1.81* 1.73*  CALCIUM 10.9* 9.7 9.4  --  9.0 8.8* 9.1 9.6  MG 1.5* 1.4* 1.7  --  1.8  --   --   --    GFR: Estimated Creatinine Clearance: 41.5 mL/min (A) (by C-G formula based on SCr of 1.73 mg/dL (H)). Liver Function Tests: No results for input(s): AST, ALT, ALKPHOS, BILITOT, PROT, ALBUMIN in the last 168 hours. No results for input(s): LIPASE, AMYLASE in the last 168 hours. No results for input(s): AMMONIA in the last 168 hours. Coagulation Profile: Recent Labs  Lab 03/30/2020 1132  INR 1.3*   Cardiac Enzymes: No results for input(s): CKTOTAL, CKMB, CKMBINDEX, TROPONINI in the last 168 hours. BNP (last 3 results) No results for input(s): PROBNP in the last 8760 hours. HbA1C: No results for input(s): HGBA1C in the last 72 hours. CBG: Recent Labs  Lab 03/29/20 1606 03/29/20 2010 03/29/20 2342 03/30/20 0400 03/30/20 0722  GLUCAP 221* 316* 188* 171* 155*   Lipid Profile: No results for input(s):  CHOL, HDL, LDLCALC, TRIG, CHOLHDL, LDLDIRECT in the last 72 hours. Thyroid Function Tests: No results for input(s): TSH, T4TOTAL, FREET4, T3FREE, THYROIDAB in the last 72 hours. Anemia Panel: No results for input(s): VITAMINB12, FOLATE, FERRITIN, TIBC, IRON, RETICCTPCT in the last 72 hours. Sepsis Labs: Recent Labs  Lab 03/29/20 1051 03/29/20 1257  LATICACIDVEN 2.3* 2.2*    Recent Results (from the past 240 hour(s))  SARS CORONAVIRUS 2 (TAT 6-24 HRS) Nasopharyngeal Nasopharyngeal Swab     Status: None   Collection Time: 03/29/2020  2:13 PM   Specimen: Nasopharyngeal Swab  Result Value Ref Range Status   SARS Coronavirus 2 NEGATIVE NEGATIVE Final    Comment: (NOTE) SARS-CoV-2 target nucleic acids are NOT DETECTED.  The SARS-CoV-2 RNA is generally detectable in upper and lower respiratory specimens during the acute phase of infection. Negative results do not preclude SARS-CoV-2 infection, do not rule out co-infections with other pathogens, and should not be used as the sole basis for treatment or other patient management decisions. Negative results must be combined with clinical observations, patient history, and epidemiological information. The expected result is Negative.  Fact Sheet for Patients: SugarRoll.be  Fact Sheet for Healthcare Providers: https://www.woods-mathews.com/  This test is not yet approved or cleared by the [place] FDA and  has been authorized for detection and/or diagnosis of SARS-CoV-2 by FDA under an Emergency Use Authorization (EUA). This EUA will remain  in effect (meaning this test can be used) for the duration of the COVID-19 declaration under Se ction 564(b)(1) of the Act, 21 U.S.C. section 360bbb-3(b)(1), unless the authorization is terminated or revoked sooner.  Performed at [facility], [place] [address], [place], [place] [address]   Culture, blood (routine x 2)     Status: Abnormal    Collection Time: 03/23/2020  5:08 PM   Specimen: BLOOD  Result Value Ref Range Status   Specimen Description  Final    BLOOD UNKNOWN Performed at [facility], [place] [address], [place], [place] [address]    Special Requests   Final    BOTTLES DRAWN AEROBIC AND ANAEROBIC Blood Culture adequate volume Performed at [place] [address] [place], [place] [address]    Culture  Setup Time   Final    GRAM POSITIVE COCCI IN BOTH AEROBIC AND ANAEROBIC BOTTLES CRITICAL RESULT CALLED TO, READ BACK BY AND VERIFIED WITH: PHARMD C.PIERCE AT 1200 ON 03/22/2020 BY T.SAAD Performed at [facility], [place] [address] [place], [place] [address]    Culture STAPHYLOCOCCUS AUREUS (A)  Final   Report Status 03/26/2020 FINAL  Final   Organism ID, Bacteria STAPHYLOCOCCUS AUREUS  Final      Susceptibility   Staphylococcus aureus - MIC*    CIPROFLOXACIN <=0.5 SENSITIVE Sensitive     ERYTHROMYCIN <=0.25 SENSITIVE Sensitive     GENTAMICIN <=0.5 SENSITIVE Sensitive     OXACILLIN 0.5 SENSITIVE Sensitive     TETRACYCLINE <=1 SENSITIVE Sensitive     VANCOMYCIN 1 SENSITIVE Sensitive     TRIMETH/SULFA <=10 SENSITIVE Sensitive     CLINDAMYCIN <=0.25 SENSITIVE Sensitive     RIFAMPIN <=0.5 SENSITIVE Sensitive     Inducible Clindamycin NEGATIVE Sensitive     * STAPHYLOCOCCUS AUREUS  Blood Culture ID Panel (Reflexed)     Status: Abnormal   Collection Time: 04/09/2020  5:08 PM  Result Value Ref Range Status   Enterococcus faecalis NOT DETECTED NOT DETECTED Final   Enterococcus Faecium NOT DETECTED NOT DETECTED Final   Listeria monocytogenes NOT DETECTED NOT DETECTED Final   Staphylococcus species DETECTED (A) NOT DETECTED Final    Comment: CRITICAL RESULT CALLED TO, READ BACK BY AND VERIFIED WITH: PHARMD C.PIERCE AT 1200 ON 03/22/2020 BY T.SAAD    Staphylococcus aureus (BCID) DETECTED (A) NOT DETECTED Final    Comment: CRITICAL RESULT CALLED TO, READ BACK BY AND  VERIFIED WITH: PHARMD C.PIERCE AT 1200 ON 03/22/2020 BY T.SAAD    Staphylococcus epidermidis NOT DETECTED NOT DETECTED Final   Staphylococcus lugdunensis NOT DETECTED NOT DETECTED Final   Streptococcus species NOT DETECTED NOT DETECTED Final   Streptococcus agalactiae NOT DETECTED NOT DETECTED Final   Streptococcus pneumoniae NOT DETECTED NOT DETECTED Final   Streptococcus pyogenes NOT DETECTED NOT DETECTED Final   A.calcoaceticus-baumannii NOT DETECTED NOT DETECTED Final   Bacteroides fragilis NOT DETECTED NOT DETECTED Final   Enterobacterales NOT DETECTED NOT DETECTED Final   Enterobacter cloacae complex NOT DETECTED NOT DETECTED Final   Escherichia coli NOT DETECTED NOT DETECTED Final   Klebsiella aerogenes NOT DETECTED NOT DETECTED Final   Klebsiella oxytoca NOT DETECTED NOT DETECTED Final   Klebsiella pneumoniae NOT DETECTED NOT DETECTED Final   Proteus species NOT DETECTED NOT DETECTED Final   Salmonella species NOT DETECTED NOT DETECTED Final   Serratia marcescens NOT DETECTED NOT DETECTED Final   Haemophilus influenzae NOT DETECTED NOT DETECTED Final   Neisseria meningitidis NOT DETECTED NOT DETECTED Final   Pseudomonas aeruginosa NOT DETECTED NOT DETECTED Final   Stenotrophomonas maltophilia NOT DETECTED NOT DETECTED Final   Candida albicans NOT DETECTED NOT DETECTED Final   Candida auris NOT DETECTED NOT DETECTED Final   Candida glabrata NOT DETECTED NOT DETECTED Final   Candida krusei NOT DETECTED NOT DETECTED Final   Candida parapsilosis NOT DETECTED NOT DETECTED Final   Candida tropicalis NOT DETECTED NOT DETECTED Final   Cryptococcus neoformans/gattii NOT DETECTED NOT DETECTED Final  Meth resistant mecA/C and MREJ NOT DETECTED NOT DETECTED Final    Comment: Performed at [facility], [place] [address], [place], [place] [address]  Culture, Urine     Status: Abnormal   Collection Time: 03/25/2020 11:48 PM   Specimen: Urine, Catheterized  Result Value Ref Range Status    Specimen Description   Final    URINE, CATHETERIZED Performed at [place] [address], [place], [place] [address]    Special Requests   Final    NONE Performed at [facility], [place] [address], [place], [place] [address]    Culture MULTIPLE SPECIES PRESENT, SUGGEST RECOLLECTION (A)  Final   Report Status 03/22/2020 FINAL  Final  Culture, blood (Routine X 2) w Reflex to ID Panel     Status: Abnormal   Collection Time: 03/22/20  2:16 PM   Specimen: BLOOD RIGHT HAND  Result Value Ref Range Status   Specimen Description   Final    BLOOD RIGHT HAND Performed at [place] [address], [place], [place] [address]    Special Requests   Final    BOTTLES DRAWN AEROBIC AND ANAEROBIC Blood Culture results may not be optimal due to an inadequate volume of blood received in culture bottles Performed at [place] [address] [place], [place] [address]    Culture  Setup Time   Final    GRAM POSITIVE COCCI IN CLUSTERS IN BOTH AEROBIC AND ANAEROBIC BOTTLES CRITICAL RESULT CALLED TO, READ BACK BY AND VERIFIED WITH: PHARMD J GADHIA [id number] AT 723 AM BY CM CRITICAL VALUE NOTED.  VALUE IS CONSISTENT WITH PREVIOUSLY REPORTED AND CALLED VALUE.    Culture (A)  Final    STAPHYLOCOCCUS AUREUS SUSCEPTIBILITIES PERFORMED ON PREVIOUS CULTURE WITHIN THE LAST 5 DAYS. Performed at [facility], [place] [address], [place], [place] [address]    Report Status 03/25/2020 FINAL  Final  Surgical PCR screen     Status: Abnormal   Collection Time: 04/10/2020 10:08 AM   Specimen: Nasal Mucosa; Nasal Swab  Result Value Ref Range Status   MRSA, PCR NEGATIVE NEGATIVE Final   Staphylococcus aureus POSITIVE (A) NEGATIVE Final    Comment: (NOTE) The Xpert SA Assay (FDA approved for NASAL specimens in patients 18 years of age and older), is one component of a comprehensive surveillance program. It is not intended to  diagnose infection nor to guide or monitor treatment. Performed at [facility], [place] [address], [place], [place] [address]   Anaerobic culture     Status: None   Collection Time: 04/01/2020  1:42 PM   Specimen: PATH Other  Result Value Ref Range Status   Specimen Description   Final    FLUID  LEFT OLECRANON Performed at [place] [address] [place], [place] [address]    Special Requests   Final    NONE Performed at [facility], [place] [address], [place], [place] [address]    Culture   Final    NO ANAEROBES ISOLATED Performed at [facility], [place] [address] [place],  [address]    Report Status 03/28/2020 FINAL  Final  Aerobic Culture (superficial specimen)     Status: None   Collection Time: 03/19/2020  1:42 PM   Specimen: PATH Other  Result Value Ref Range Status   Specimen Description   Final    FLUID  LEFT OLECRANON Performed at [facility]  [place] [address], [place], [place] [address]    Special Requests   Final    NONE Performed at [facility], [place] [address] [place], [place] [address]    Gram Stain   Final    NO WBC SEEN RARE GRAM POSITIVE COCCI Performed at [facility], [place] [address], [place], [place] [address]    Culture FEW STAPHYLOCOCCUS AUREUS  Final   Report Status 03/26/2020 FINAL  Final   Organism ID, Bacteria STAPHYLOCOCCUS AUREUS  Final      Susceptibility   Staphylococcus aureus - MIC*    CIPROFLOXACIN <=0.5 SENSITIVE Sensitive     ERYTHROMYCIN <=0.25 SENSITIVE Sensitive     GENTAMICIN <=0.5 SENSITIVE Sensitive     OXACILLIN 0.5 SENSITIVE Sensitive     TETRACYCLINE <=1 SENSITIVE Sensitive     VANCOMYCIN 1 SENSITIVE Sensitive     TRIMETH/SULFA <=10 SENSITIVE Sensitive     CLINDAMYCIN <=0.25 SENSITIVE Sensitive     RIFAMPIN <=0.5 SENSITIVE Sensitive     Inducible Clindamycin NEGATIVE Sensitive     * FEW STAPHYLOCOCCUS AUREUS   Aerobic/Anaerobic Culture (surgical/deep wound)     Status: None   Collection Time: 03/31/2020  1:46 PM   Specimen: PATH Soft tissue  Result Value Ref Range Status   Specimen Description   Final    TISSUE LEFT OLECRANON INFECTED Performed at [place] [address], [place], [place] [address]    Special Requests   Final    NONE Performed at [facility], [place] [address] [place], [place] [address]    Gram Stain   Final    RARE WBC PRESENT, PREDOMINANTLY PMN RARE GRAM POSITIVE COCCI    Culture   Final    FEW STAPHYLOCOCCUS AUREUS NO ANAEROBES ISOLATED Performed at [facility], [place] [address] [place], [place] [address]    Report Status 03/28/2020 FINAL  Final   Organism ID, Bacteria STAPHYLOCOCCUS AUREUS  Final      Susceptibility   Staphylococcus aureus - MIC*    CIPROFLOXACIN <=0.5 SENSITIVE Sensitive     ERYTHROMYCIN <=0.25 SENSITIVE Sensitive     GENTAMICIN <=0.5 SENSITIVE Sensitive     OXACILLIN 0.5 SENSITIVE Sensitive     TETRACYCLINE <=1 SENSITIVE Sensitive     VANCOMYCIN 1 SENSITIVE Sensitive     TRIMETH/SULFA <=10 SENSITIVE Sensitive     CLINDAMYCIN <=0.25 SENSITIVE Sensitive     RIFAMPIN <=0.5 SENSITIVE Sensitive     Inducible Clindamycin NEGATIVE Sensitive     * FEW STAPHYLOCOCCUS AUREUS  Culture, blood (routine x 2)     Status: None   Collection Time: 04/07/2020 12:00 AM   Specimen: BLOOD  Result Value Ref Range Status   Specimen Description   Final    BLOOD RIGHT ANTECUBITAL Performed at [place] [address], [place], [place] [address]    Special Requests   Final    BOTTLES DRAWN AEROBIC ONLY Blood Culture results may not be optimal due to an inadequate volume of blood received in culture bottles Performed at [place] [address], [place], [place] [address]    Culture   Final    NO GROWTH 5 DAYS Performed at [facility], [place] [address]  [place], [place] [address]    Report Status 03/29/2020 FINAL  Final  Culture, blood (routine x 2)     Status: None   Collection Time: 04/08/2020 12:00 AM  Specimen: BLOOD LEFT HAND  Result Value Ref Range Status   Specimen Description   Final    BLOOD LEFT HAND Performed at [place] [address] [place], [place] [address]    Special Requests   Final    BOTTLES DRAWN AEROBIC ONLY Blood Culture results may not be optimal due to an inadequate volume of blood received in culture bottles Performed at [place] [address], [place], [place] [address]    Culture   Final    NO GROWTH 5 DAYS Performed at [facility], [place] [address] [place], [place] [address]    Report Status 03/29/2020 FINAL  Final  Culture, blood (routine x 2)     Status: None (Preliminary result)   Collection Time: 03/29/20 10:51 AM   Specimen: BLOOD  Result Value Ref Range Status   Specimen Description   Final    BLOOD BLOOD RIGHT HAND Performed at [facility] [address], [place], [place] [address]    Special Requests   Final    BOTTLES DRAWN AEROBIC AND ANAEROBIC Blood Culture adequate volume Performed at [facility] [address] [place], [place] [address]    Culture   Final    NO GROWTH < 24 HOURS Performed at [facility] [address], [place], [place] [address]    Report Status PENDING  Incomplete  Culture, blood (routine x 2)     Status: None (Preliminary result)   Collection Time: 03/29/20 10:52 AM   Specimen: BLOOD  Result Value Ref Range Status   Specimen Description   Final    BLOOD BLOOD RIGHT HAND Performed at [facility] [address], [place], [place] [address]    Special Requests   Final    BOTTLES DRAWN AEROBIC ONLY Blood Culture results may not be optimal due to an inadequate volume of blood received in culture bottles Performed at [facility] [address], [place], [place] [address]    Culture   Final    NO GROWTH < 24 HOURS Performed at [facility] [address], [place], [place] [address]    Report Status PENDING  Incomplete      Radiology Studies: CT CHEST WO CONTRAST  Result Date: 03/30/2020 CLINICAL DATA:  Pneumonia, abnormal chest radiograph with questionable cavitation in the LEFT lower lobe raising question of lung abscess EXAM: CT CHEST WITHOUT CONTRAST TECHNIQUE: Multidetector CT imaging of the chest was performed following the standard protocol without IV contrast. Sagittal and coronal MPR images reconstructed from axial data set. COMPARISON:  Chest radiograph 03/30/2020 FINDINGS: Cardiovascular: Atherosclerotic calcifications aorta, proximal great vessels and coronary arteries. 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 Heart upper normal size. No pericardial effusion. Mediastinum/Nodes: Esophagus unremarkable. LEFT thyroid nodule 2.2 x 1.9 cm; recommend thyroid US. (Ref: J Am Coll Radiol. 2015 Feb;12(2): 143-50). Base of cervical region otherwise normal appearance. No thoracic adenopathy. Lungs/Pleura: RIGHT apex mass 2.7 x 2.3 x 2.0 cm concerning for neoplasm, potentially with chest wall invasion. BILATERAL lower lobe infiltrates consistent with atelectasis and pneumonia. No pleural effusion or pneumothorax. Upper Abdomen: Adrenal thickening without mass. Ill-defined peripancreatic tissue planes raising question of pancreatitis at tail, incompletely visualized and assessed. Infiltrative changes appear to extend between the stomach and spleen. Musculoskeletal: Scattered mild degenerative disc disease changes of the thoracic spine. IMPRESSION: RIGHT apical lung mass 2.7 x 2.3 x 2.0 cm concerning  for neoplasm, potentially with chest wall invasion; recommend PET-CT assessment. BILATERAL lower lobe infiltrates consistent with atelectasis and pneumonia. No evidence of cavitary  pneumonia/lung abscess. Ill-defined peripancreatic tissue planes raising question of pancreatitis at tail, incompletely visualized and assessed; recommend correlation with serum lipase and dedicated CT abdomen. LEFT thyroid nodule 2.2 x 1.9 cm; recommend thyroid ultrasound assessment as above. Aortic Atherosclerosis (ICD10-I70.0). Electronically Signed   By: Lavonia Dana M.D.   On: 03/30/2020 11:20   DG CHEST PORT 1 VIEW  Result Date: 03/30/2020 CLINICAL DATA:  Respiratory failure and hemoptysis. EXAM: PORTABLE CHEST 1 VIEW COMPARISON:  03/20/2020 FINDINGS: The heart size is stable. Worsening bilateral lower lobe pneumonia, left greater than right. There is suggestion potential component of cavitation in the left lower lobe airspace process but this is not very well-defined on the portable chest x-ray. No edema, pneumothorax or pleural fluid identified. IMPRESSION: Worsening bilateral lower lobe pneumonia, left greater than right. There is suggestion of potential component of cavitation in the left lower lobe airspace process. Electronically Signed   By: Aletta Edouard M.D.   On: 03/30/2020 09:20      Scheduled Meds: . atorvastatin  40 mg Oral Daily  . docusate sodium  100 mg Oral BID  . fenofibrate  160 mg Oral Daily  . fluconazole  150 mg Oral Once  . insulin aspart  0-15 Units Subcutaneous Q4H  . insulin aspart  5 Units Subcutaneous TID WC  . insulin glargine  18 Units Subcutaneous QHS  . melatonin  3 mg Oral QHS  . metoprolol succinate  100 mg Oral Daily  . nystatin   Topical TID  . sertraline  100 mg Oral Daily   Continuous Infusions: . dextrose 5 % and 0.45 % NaCl with KCl 20 mEq/L 75 mL/hr at 03/30/20 0811  . piperacillin-tazobactam (ZOSYN)  IV 3.375 g (03/30/20 1020)     LOS: 9 days      Time spent: 40 minutes   Dessa Phi, DO [facility] 03/30/2020, 11:34 AM   Available via Epic secure chat 7am-7pm After these hours, please refer to coverage provider listed on  [email or web address]

## 2020-03-30 NOTE — Progress Notes (Signed)
PT Cancellation Note  Patient Details Name: Deborah Figueroa MRN: [id number] DOB: 1955-04-08   Cancelled Treatment:     RN reports "now is not a good time", pt having a nose bleed and other medical issues to "hold off" for today.  Pt has been evaluated with rec for SNF.    Rica Koyanagi  PTA Acute  Rehabilitation Services Pager      [phone] Office      [phone]

## 2020-03-31 LAB — GLUCOSE, CAPILLARY
Glucose-Capillary: 146 mg/dL — ABNORMAL HIGH (ref 70–99)
Glucose-Capillary: 156 mg/dL — ABNORMAL HIGH (ref 70–99)
Glucose-Capillary: 156 mg/dL — ABNORMAL HIGH (ref 70–99)
Glucose-Capillary: 167 mg/dL — ABNORMAL HIGH (ref 70–99)
Glucose-Capillary: 168 mg/dL — ABNORMAL HIGH (ref 70–99)
Glucose-Capillary: 182 mg/dL — ABNORMAL HIGH (ref 70–99)
Glucose-Capillary: 183 mg/dL — ABNORMAL HIGH (ref 70–99)
Glucose-Capillary: 186 mg/dL — ABNORMAL HIGH (ref 70–99)

## 2020-03-31 LAB — CBC
HCT: 19.5 % — ABNORMAL LOW (ref 36.0–46.0)
Hemoglobin: 6 g/dL — CL (ref 12.0–15.0)
MCH: 27 pg (ref 26.0–34.0)
MCHC: 30.8 g/dL (ref 30.0–36.0)
MCV: 87.8 fL (ref 80.0–100.0)
Platelets: 287 10*3/uL (ref 150–400)
RBC: 2.22 MIL/uL — ABNORMAL LOW (ref 3.87–5.11)
RDW: 16.7 % — ABNORMAL HIGH (ref 11.5–15.5)
WBC: 16.6 10*3/uL — ABNORMAL HIGH (ref 4.0–10.5)
nRBC: 0 % (ref 0.0–0.2)

## 2020-03-31 LAB — BASIC METABOLIC PANEL
Anion gap: 11 (ref 5–15)
BUN: 60 mg/dL — ABNORMAL HIGH (ref 8–23)
CO2: 18 mmol/L — ABNORMAL LOW (ref 22–32)
Calcium: 9.1 mg/dL (ref 8.9–10.3)
Chloride: 114 mmol/L — ABNORMAL HIGH (ref 98–111)
Creatinine, Ser: 2.09 mg/dL — ABNORMAL HIGH (ref 0.44–1.00)
GFR, Estimated: 26 mL/min — ABNORMAL LOW (ref >60)
Glucose, Bld: 188 mg/dL — ABNORMAL HIGH (ref 70–99)
Potassium: 3.2 mmol/L — ABNORMAL LOW (ref 3.5–5.1)
Sodium: 143 mmol/L (ref 135–145)

## 2020-03-31 LAB — PREPARE RBC (CROSSMATCH)

## 2020-03-31 LAB — HEMOGLOBIN AND HEMATOCRIT, BLOOD
HCT: 22.9 % — ABNORMAL LOW (ref 36.0–46.0)
Hemoglobin: 7.4 g/dL — ABNORMAL LOW (ref 12.0–15.0)

## 2020-03-31 LAB — MAGNESIUM: Magnesium: 1.8 mg/dL (ref 1.7–2.4)

## 2020-03-31 MED ORDER — MAGIC MOUTHWASH
5.0000 mL | Freq: Four times a day (QID) | ORAL | Status: DC | PRN
  Administered 2020-03-31 – 2020-04-01 (×2): 5 mL via ORAL
  Filled 2020-03-31 (×3): qty 5

## 2020-03-31 MED ORDER — SODIUM CHLORIDE 0.9% IV SOLUTION: Freq: Once | INTRAVENOUS | Status: AC

## 2020-03-31 MED ORDER — SODIUM CHLORIDE 0.9 % IV SOLN
2.0000 g | Freq: Two times a day (BID) | INTRAVENOUS | Status: DC
  Administered 2020-03-31 – 2020-04-01 (×2): 2 g via INTRAVENOUS
  Filled 2020-03-31 (×2): qty 2

## 2020-03-31 MED ORDER — POTASSIUM CHLORIDE CRYS ER 20 MEQ PO TBCR
40.0000 meq | EXTENDED_RELEASE_TABLET | ORAL | Status: AC
  Administered 2020-03-31: 40 meq via ORAL
  Filled 2020-03-31 (×2): qty 2

## 2020-03-31 NOTE — Progress Notes (Signed)
CRITICAL VALUE ALERT  Critical Value:  Hgb 6.0  Date & Time Notied:  03/31/2020  1020  Provider Notified: Dr. Maylene Roes  Orders Received/Actions taken: orders received

## 2020-03-31 NOTE — Progress Notes (Addendum)
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    BOTTLES DRAWN AEROBIC AND ANAEROBIC Blood Culture adequate volume Performed at [facility] [address] [place], [place] [address]    Culture   Final    NO GROWTH 2 DAYS Performed at [facility] [address] [place], [place] [address]    Report Status  PENDING  Incomplete  Culture, blood (routine x 2)     Status: None (Preliminary result)   Collection Time: 03/29/20 10:52 AM   Specimen: BLOOD  Result Value Ref Range Status   Specimen Description   Final    BLOOD BLOOD RIGHT HAND Performed at [facility] [address], [place], [place] [address]    Special Requests   Final    BOTTLES DRAWN AEROBIC ONLY Blood Culture results may not be optimal due to an inadequate volume of blood received in culture bottles Performed at [facility] [address], [place], [place] [address]    Culture   Final    NO GROWTH 2 DAYS Performed at [facility] [address], [place],  [address]    Report Status PENDING  Incomplete      Radiology Studies: CT CHEST WO CONTRAST  Result Date: 03/30/2020 CLINICAL DATA:  Pneumonia, abnormal chest radiograph with questionable cavitation in the LEFT lower lobe raising question of lung abscess EXAM: CT CHEST WITHOUT CONTRAST TECHNIQUE: Multidetector CT imaging of the chest was performed following the standard protocol without IV contrast. Sagittal and coronal MPR images reconstructed from axial data set. COMPARISON:  Chest radiograph 03/30/2020 FINDINGS: Cardiovascular: Atherosclerotic calcifications aorta, proximal great vessels and coronary arteries. 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 Heart upper normal size. No pericardial effusion. Mediastinum/Nodes: Esophagus unremarkable. LEFT thyroid nodule 2.2 x 1.9 cm; recommend thyroid US. (Ref: J Am Coll Radiol. 2015 Feb;12(2): 143-50). Base of cervical region otherwise normal appearance. No thoracic adenopathy. Lungs/Pleura:  RIGHT apex mass 2.7 x 2.3 x 2.0 cm concerning for neoplasm, potentially with chest wall invasion. BILATERAL lower lobe infiltrates consistent with atelectasis and pneumonia. No pleural effusion or pneumothorax. Upper Abdomen: Adrenal thickening without mass. Ill-defined  peripancreatic tissue planes raising question of pancreatitis at tail, incompletely visualized and assessed. Infiltrative changes appear to extend between the stomach and spleen. Musculoskeletal: Scattered mild degenerative disc disease changes of the thoracic spine. 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 BILATERAL lower lobe infiltrates consistent with atelectasis and pneumonia. No evidence of cavitary pneumonia/lung abscess. Ill-defined peripancreatic tissue planes raising question of pancreatitis at tail, incompletely visualized and assessed; recommend correlation with serum lipase and dedicated CT abdomen. LEFT thyroid nodule 2.2 x 1.9 cm; recommend thyroid ultrasound assessment as above. Aortic Atherosclerosis (ICD10-I70.0). Electronically Signed   By: Lavonia Dana M.D.   On: 03/30/2020 11:20   DG CHEST PORT 1 VIEW  Result Date: 03/30/2020 CLINICAL DATA:  Respiratory failure and hemoptysis. EXAM: PORTABLE CHEST 1 VIEW COMPARISON:  04/09/2020 FINDINGS: The heart size is stable. Worsening bilateral lower lobe pneumonia, left greater than right. There is suggestion potential component of cavitation in the left lower lobe airspace process but this is not very well-defined on the portable chest x-ray. No edema, pneumothorax or pleural fluid identified. IMPRESSION: Worsening bilateral lower lobe pneumonia, left greater than right. There is suggestion of potential component of cavitation in the left lower lobe airspace process. Electronically Signed   By: Aletta Edouard M.D.   On: 03/30/2020 09:20      Scheduled Meds: . sodium chloride   Intravenous Once  . atorvastatin  40 mg Oral Daily  . docusate sodium  100 mg Oral BID  . fenofibrate  160 mg Oral Daily  . fluconazole  150 mg Oral Once  . insulin aspart  0-15 Units Subcutaneous Q4H  . insulin aspart  5 Units Subcutaneous TID WC  . insulin  glargine  18 Units Subcutaneous QHS  . melatonin  3 mg Oral QHS  . metoprolol succinate  100 mg Oral Daily  . nystatin   Topical TID  . potassium chloride  40 mEq Oral Q4H  . sertraline  100 mg Oral Daily   Continuous Infusions: . piperacillin-tazobactam (ZOSYN)  IV 3.375 g (03/31/20 1032)     LOS: 10 days      Time spent: 40 minutes   Dessa Phi, DO [facility] 03/31/2020, 12:28 PM   Available via Epic secure chat 7am-7pm After these hours, please refer to coverage provider listed on [email or web address]

## 2020-03-31 NOTE — Progress Notes (Signed)
Pharmacy Antibiotic Note  Deborah Figueroa is a 65 y.o. female admitted on 04/05/2020 with MSSA bacteremia/ left elbow olecronon bursitis. Patient now noted to have an elevated white count with worsening left lower lobe pneumonia.  Pharmacy has been consulted for cefepime dosing for HAP. WBC today down slightly to 16.6 and patient afebrile. SCr today is up to 2.09.   Plan: Cefepime 2 gm IV q 12 hours Monitor clinical s/sx of infection , renal function Re-narrow when able- Plan MSSA treatment through ~ 05/05/20  Height: 5\' 11"  (180.3 cm) Weight: 93.9 kg (207 lb) (taken from previous charting) IBW/kg (Calculated) : 70.8  Temp (24hrs), Avg:98 F (36.7 C), Min:98 F (36.7 C), Max:98.1 F (36.7 C)  Recent Labs  Lab 03/27/20 0605 03/28/20 0705 03/29/20 0518 03/29/20 1051 03/29/20 1257 03/29/20 2303 03/30/20 0507 03/31/20 0710  WBC 15.6* 16.9* 19.6*  --   --  20.2* 20.5* 16.6*  CREATININE 1.69* 1.76* 1.81*  --   --   --  1.73* 2.09*  LATICACIDVEN  --   --   --  2.3* 2.2*  --   --   --     Estimated Creatinine Clearance: 34.3 mL/min (A) (by C-G formula based on SCr of 2.09 mg/dL (H)).    No Known Allergies   Thank you for allowing pharmacy to be a part of this patient's care.  Jimmy Footman, PharmD, BCPS, [place] Infectious Diseases Clinical Pharmacist Phone: [phone] 03/31/2020 12:38 PM

## 2020-03-31 NOTE — Plan of Care (Signed)
  Problem: Elimination: Goal: Will not experience complications related to bowel motility Outcome: Progressing Goal: Will not experience complications related to urinary retention Outcome: Progressing   Problem: Pain Managment: Goal: General experience of comfort will improve Outcome: Progressing   Problem: Safety: Goal: Ability to remain free from injury will improve Outcome: Progressing   

## 2020-03-31 NOTE — Plan of Care (Signed)

## 2020-04-01 ENCOUNTER — Inpatient Hospital Stay (HOSPITAL_COMMUNITY): Payer: Medicaid Other

## 2020-04-01 DIAGNOSIS — M86032 Acute hematogenous osteomyelitis, left radius and ulna: Secondary | ICD-10-CM

## 2020-04-01 DIAGNOSIS — E872 Acidosis: Secondary | ICD-10-CM

## 2020-04-01 DIAGNOSIS — R609 Edema, unspecified: Secondary | ICD-10-CM

## 2020-04-01 LAB — GLUCOSE, CAPILLARY
Glucose-Capillary: 112 mg/dL — ABNORMAL HIGH (ref 70–99)
Glucose-Capillary: 120 mg/dL — ABNORMAL HIGH (ref 70–99)
Glucose-Capillary: 130 mg/dL — ABNORMAL HIGH (ref 70–99)
Glucose-Capillary: 122 mg/dL — ABNORMAL HIGH (ref 70–99)
Glucose-Capillary: 102 mg/dL — ABNORMAL HIGH (ref 70–99)

## 2020-04-01 LAB — URINE CULTURE: Culture: >=100000 — AB

## 2020-04-01 LAB — BASIC METABOLIC PANEL
Anion gap: 12 (ref 5–15)
BUN: 74 mg/dL — ABNORMAL HIGH (ref 8–23)
CO2: 17 mmol/L — ABNORMAL LOW (ref 22–32)
Calcium: 9.4 mg/dL (ref 8.9–10.3)
Chloride: 115 mmol/L — ABNORMAL HIGH (ref 98–111)
Creatinine, Ser: 2.77 mg/dL — ABNORMAL HIGH (ref 0.44–1.00)
GFR, Estimated: 19 mL/min — ABNORMAL LOW (ref >60)
Glucose, Bld: 116 mg/dL — ABNORMAL HIGH (ref 70–99)
Potassium: 3.6 mmol/L (ref 3.5–5.1)
Sodium: 144 mmol/L (ref 135–145)

## 2020-04-01 LAB — PROTIME-INR
INR: 3.8 — ABNORMAL HIGH (ref 0.8–1.2)
Prothrombin Time: 36.4 seconds — ABNORMAL HIGH (ref 11.4–15.2)

## 2020-04-01 LAB — CBC
HCT: 22.8 % — ABNORMAL LOW (ref 36.0–46.0)
Hemoglobin: 7.1 g/dL — ABNORMAL LOW (ref 12.0–15.0)
MCH: 27.1 pg (ref 26.0–34.0)
MCHC: 31.1 g/dL (ref 30.0–36.0)
MCV: 87 fL (ref 80.0–100.0)
Platelets: 325 10*3/uL (ref 150–400)
RBC: 2.62 MIL/uL — ABNORMAL LOW (ref 3.87–5.11)
RDW: 17.9 % — ABNORMAL HIGH (ref 11.5–15.5)
WBC: 17.5 10*3/uL — ABNORMAL HIGH (ref 4.0–10.5)
nRBC: 0 % (ref 0.0–0.2)

## 2020-04-01 LAB — MAGNESIUM: Magnesium: 1.8 mg/dL (ref 1.7–2.4)

## 2020-04-01 LAB — APTT: aPTT: 134 seconds — ABNORMAL HIGH (ref 24–36)

## 2020-04-01 MED ORDER — MAGIC MOUTHWASH W/LIDOCAINE
5.0000 mL | Freq: Three times a day (TID) | ORAL | Status: DC | PRN
  Administered 2020-04-01 – 2020-04-03 (×4): 5 mL via ORAL
  Filled 2020-04-01 (×5): qty 5

## 2020-04-01 MED ORDER — OXYMETAZOLINE HCL 0.05 % NA SOLN
3.0000 | Freq: Two times a day (BID) | NASAL | Status: DC | PRN
  Administered 2020-04-01: 3 via NASAL
  Filled 2020-04-01: qty 15

## 2020-04-01 MED ORDER — POTASSIUM CHLORIDE IN NACL 20-0.45 MEQ/L-% IV SOLN
INTRAVENOUS | Status: DC
  Filled 2020-04-01 (×3): qty 1000

## 2020-04-01 MED ORDER — PHENOL 1.4 % MT LIQD
1.0000 | OROMUCOSAL | Status: DC | PRN
  Filled 2020-04-01: qty 177

## 2020-04-01 MED ORDER — SODIUM CHLORIDE 0.9 % IV SOLN
2.0000 g | INTRAVENOUS | Status: DC
  Administered 2020-04-02 – 2020-04-05 (×4): 2 g via INTRAVENOUS
  Filled 2020-04-01 (×4): qty 2

## 2020-04-01 NOTE — Progress Notes (Signed)
Pharmacy Antibiotic Note  Deborah Figueroa is a 65 y.o. female admitted on 03/23/2020 with MSSA bacteremia/ left elbow olecronon bursitis. Patient now noted to have an elevated white count with worsening left lower lobe pneumonia.  Pharmacy has been consulted for cefepime dosing for HAP. WBC today up slightly to 17.5. SCr jumped from 2.09 to 2.77.   Plan: Reduce to cefepime 2 gm every 24 hours  Monitor clinical s/sx of infection , renal function Re-narrow when able- Plan MSSA treatment through ~ 05/05/20  Height: 5\' 11"  (180.3 cm) Weight: 93.9 kg (207 lb) (taken from previous charting) IBW/kg (Calculated) : 70.8  Temp (24hrs), Avg:98.1 F (36.7 C), Min:97.3 F (36.3 C), Max:98.5 F (36.9 C)  Recent Labs  Lab 03/28/20 0705 03/29/20 0518 03/29/20 1051 03/29/20 1257 03/29/20 2303 03/30/20 0507 03/31/20 0710 04/01/20 0526  WBC 16.9* 19.6*  --   --  20.2* 20.5* 16.6* 17.5*  CREATININE 1.76* 1.81*  --   --   --  1.73* 2.09* 2.77*  LATICACIDVEN  --   --  2.3* 2.2*  --   --   --   --     Estimated Creatinine Clearance: 25.9 mL/min (A) (by C-G formula based on SCr of 2.77 mg/dL (H)).    No Known Allergies   Thank you for allowing pharmacy to be a part of this patient's care.  Jimmy Footman, PharmD, BCPS, BCIDP Infectious Diseases Clinical Pharmacist Phone: [phone] 04/01/2020 9:30 AM

## 2020-04-01 NOTE — Progress Notes (Signed)
Occupational Therapy Treatment Patient Details Name: Deborah Figueroa MRN: [id number] DOB: Dec 01, 1955 Today's Date: 04/01/2020    History of present illness 65 year old woman presenting with diabetic ketoacidosis and acute renal failure found to have MSSA bacteremia of unclear etiology. PMH of HTN, HLD, and IDDM who presents the ED via EMS for weakness and hyperglycemia.  Pt s/p I and D left elbow infection and osteomyelitis 03/20/2020   OT comments  Patient up in chair with nose bleed when therapist entered the room. Patient set up to wash her face and hands with therapist assisting only after task to improve quality. Patient able to assist with donning hospital gown - lifting arms and threading arms through sleeves. Patient max assist with assistance of two to stand using stedy and to transfer back to bed. Patient's UEs weak and edematous and patient not able to UE strength to maintain upright position on stedy. Patient able to sit edge of bed propping with BUEs. Patient mod assist for LEs to return to supine. Patient left in care of nursing. Patient improved cognition and participation today and hope to start progressing towards goals.   Follow Up Recommendations  SNF;Supervision/Assistance - 24 hour    Equipment Recommendations  Other (comment)    Recommendations for Other Services      Precautions / Restrictions Precautions Precautions: Fall Precaution Comments: left elbow active use to tolerance per ortho notes Restrictions Weight Bearing Restrictions: No       Mobility Bed Mobility Overal bed mobility: Needs Assistance Bed Mobility: Sit to Sidelying     Supine to sit: Mod assist   Sit to sidelying: Mod assist General bed mobility comments: MOd assist for LEs to return to supine.  Transfers Overall transfer level: Needs assistance   Transfers: Sit to/from Stand Sit to Stand: From elevated surface;+2 physical assistance;Max assist         General transfer comment:  Patient max assist to stand from recliner height (assistance of two needed) with use of stedy. Patient exhibits weak hip muscles and difficulty with assist to stand. Use of stedy to transfer back to bed. Patient not able to maintain extended arms to support trunk and leaning towards horizontal bar.    Balance Overall balance assessment: Needs assistance Sitting-balance support: Feet supported;Bilateral upper extremity supported Sitting balance-Leahy Scale: Poor Sitting balance - Comments: Use of BUEs to prop at side of bed                                   ADL either performed or assessed with clinical judgement   ADL Overall ADL's : Needs assistance/impaired     Grooming: Set up;Wash/dry face;Sitting Grooming Details (indicate cue type and reason): Patient bleeding from her nose - with blood on face, hands, telephone, gown, lines and chair. Patient setup to wash face and hands. Therapist went over face and hands to improve quality.         Upper Body Dressing : Moderate assistance Upper Body Dressing Details (indicate cue type and reason): Mod assist to don gown.                         Vision   Vision Assessment?: No apparent visual deficits Additional Comments: Decreased acuity. Patient ordering contacts when therapist entered the room.   Perception     Praxis      Cognition Arousal/Alertness: Awake/alert Behavior During Therapy: [facility] for  tasks assessed/performed Overall Cognitive Status: Within Functional Limits for tasks assessed                                 General Comments: Improved cognition and participation. Patient does state some questionable things - but was able to order contacts on the phone.        Exercises     Shoulder Instructions       General Comments      Pertinent Vitals/ Pain       Pain Assessment: No/denies pain Faces Pain Scale: Hurts little more Pain Location: right hand (edema) Pain Descriptors  / Indicators: Grimacing;Guarding Pain Intervention(s): Repositioned;Monitored during session  Home Living                                          Prior Functioning/Environment              Frequency  Min 2X/week        Progress Toward Goals  OT Goals(current goals can now be found in the care plan section)  Progress towards OT goals: OT to reassess next treatment  Acute Rehab OT Goals Patient Stated Goal: To wash her hair OT Goal Formulation: With patient Time For Goal Achievement: 04/05/20 Potential to Achieve Goals: Good  Plan Discharge plan remains appropriate    Co-evaluation                 AM-PAC OT "6 Clicks" Daily Activity     Outcome Measure   Help from another person eating meals?: A Little Help from another person taking care of personal grooming?: A Little Help from another person toileting, which includes using toliet, bedpan, or urinal?: Total Help from another person bathing (including washing, rinsing, drying)?: A Lot Help from another person to put on and taking off regular upper body clothing?: A Lot Help from another person to put on and taking off regular lower body clothing?: Total 6 Click Score: 12    End of Session Equipment Utilized During Treatment: Other (comment) (stedy)  OT Visit Diagnosis: Other abnormalities of gait and mobility (R26.89);Muscle weakness (generalized) (M62.81);Other symptoms and signs involving cognitive function;Pain   Activity Tolerance Patient tolerated treatment well   Patient Left in bed;with call bell/phone within reach;with family/visitor present   Nurse Communication Mobility status        Time: 1607-3710 OT Time Calculation (min): 29 min  Charges: OT General Charges $OT Visit: 1 Visit OT Treatments $Self Care/Home Management : 8-22 mins $Therapeutic Activity: 8-22 mins  Desirae Mancusi, OTR/L [place]  Office [phone] Pager: [place] 04/01/2020, 4:12 PM

## 2020-04-01 NOTE — Progress Notes (Signed)
Physical Therapy Treatment Patient Details Name: Deborah Figueroa MRN: [id number] DOB: 1956/02/19 Today's Date: 04/01/2020    History of Present Illness 65 year old woman presenting with diabetic ketoacidosis and acute renal failure found to have MSSA bacteremia of unclear etiology. PMH of HTN, HLD, and IDDM who presents the ED via EMS for weakness and hyperglycemia.  Pt s/p I and D left elbow infection and osteomyelitis 03/31/2020    PT Comments    Pt assisted with sitting EOB and able to stand with significant assist.  Michaelyn Barter to transfer to recliner.  Continue to recommend SNF upon d/c.   Follow Up Recommendations  SNF     Equipment Recommendations  None recommended by PT    Recommendations for Other Services       Precautions / Restrictions Precautions Precautions: Fall Precaution Comments: left elbow active use to tolerance per ortho notes    Mobility  Bed Mobility Overal bed mobility: Needs Assistance Bed Mobility: Supine to Sit     Supine to sit: Mod assist     General bed mobility comments: pt initiating with cues, requiring assist for scooting to EOB and trunk upright; utilized bed pad  Transfers Overall transfer level: Needs assistance   Transfers: Sit to/from Stand Sit to Stand: From elevated surface;+2 physical assistance;Max assist         General transfer comment: provided stedy for safe transfer and elevated bed; required use of bed pad to assist with lifting buttocks off bed, also required assist for pericare due to stool on bed pad with rise; utilized stedy to transfer to recliner  Ambulation/Gait                 Stairs             Wheelchair Mobility    Modified Rankin (Stroke Patients Only)       Balance                                            Cognition Arousal/Alertness: Awake/alert Behavior During Therapy: WFL for tasks assessed/performed Overall Cognitive Status: Within Functional Limits  for tasks assessed                                 General Comments: appears more appropriate today however nursing staff reports variable cognition      Exercises      General Comments        Pertinent Vitals/Pain Pain Assessment: Faces Faces Pain Scale: Hurts little more Pain Location: right hand (edema) Pain Descriptors / Indicators: Grimacing;Guarding Pain Intervention(s): Repositioned;Monitored during session    Home Living                      Prior Function            PT Goals (current goals can now be found in the care plan section) Progress towards PT goals: Progressing toward goals    Frequency    Min 2X/week      PT Plan Current plan remains appropriate    Co-evaluation              AM-PAC PT "6 Clicks" Mobility   Outcome Measure  Help needed turning from your back to your side while in a flat bed without using bedrails?: A Lot Help  needed moving from lying on your back to sitting on the side of a flat bed without using bedrails?: A Lot Help needed moving to and from a bed to a chair (including a wheelchair)?: Total Help needed standing up from a chair using your arms (e.g., wheelchair or bedside chair)?: Total Help needed to walk in hospital room?: Total Help needed climbing 3-5 steps with a railing? : Total 6 Click Score: 8    End of Session Equipment Utilized During Treatment: Gait belt Activity Tolerance: Patient tolerated treatment well Patient left: in chair;with call bell/phone within reach;with chair alarm set Nurse Communication: Need for lift equipment;Mobility status PT Visit Diagnosis: Other abnormalities of gait and mobility (R26.89);Muscle weakness (generalized) (M62.81) Pt has maximove lift pad under her in chair for assist for safely returning to bed. - nursing staff aware    Time: [phone] PT Time Calculation (min) (ACUTE ONLY): 29 min  Charges:  $Therapeutic Activity: 23-37 mins                     Jannette Spanner PT, DPT Acute Rehabilitation Services Pager: [phone] Office: [phone]  York Ram E 04/01/2020, 1:37 PM

## 2020-04-01 NOTE — Progress Notes (Signed)
Right upper extremity venous duplex has been completed. Preliminary results can be found in CV Proc through chart review.  Results were given to the patient's nurse, Hinton Dyer.  04/01/20 9:29 AM Deborah Figueroa RVT

## 2020-04-01 NOTE — Progress Notes (Signed)
Pt experienced nose bleed. Pressure held and afrin given per order. Dr. Cyndia Skeeters notified. Applied "gauze pressure dressing" to left nostril. Carney Saxton, Laurel Dimmer, RN

## 2020-04-01 NOTE — Plan of Care (Signed)
  Problem: Education: Goal: Knowledge of General Education information will improve Description: Including pain rating scale, medication(s)/side effects and non-pharmacologic comfort measures Outcome: Completed/Met

## 2020-04-01 NOTE — Progress Notes (Signed)
PROGRESS NOTE  Deborah Figueroa HWE:[id number] DOB: 04-May-1955   PCP: Ann Held, DO  Patient is from: Homeless  DOA: 04/12/2020 LOS: 71  Chief complaints: Fall  Brief Narrative / Interim history: 65 y.o.year old femalewith medical history significant for poorly controlled Type 2 Diabetes, chronic hypercalcemia presumed to be primary hyperparathyroidism,depression, HTN, HLD, recent history of recurrent falls requiring ED visits on 12/18 and 12/19 with no concerning findings on imaging at that time and treated supportively, as well as elevated calcium being evaluated as outpatient with concern for her likely hyperparathyroidism with plan to see surgeon on 1/12 who presented on 1/8 after having another fall in her hotel room. She was evaluated by EMS noted to have elevated CBG in the setting of increased thirst, polyuria and recently changed diabetic medications.   Last ED visit on 03/19/20 with XR of left elbow for evaluation of left elbow pain with recent fall and concerning for irregularity of the olecranon with possibility of underlying osteomyelitis and recommended MRI if clinically indicated. Lumbar spine XR, Right shoulder XR, and CT head at that time was negative for any acute findings except for periorbital hematoma.   In the ED, T-max 98.5, heart rate range 110-125, respiratory rate 24-30, initial blood pressure 140/84. Glucose 650, CO2 13, calcium 13.3, anion gap 19, sodium 132. UA unremarkable, beta hydroxybutyrate 6.21, pH 7.2 on VBG with CO2 35.7, COVID test negative, WBC 18, hemoglobin 7.7 (previous baseline 9.5 on 12/20). Chest x-ray was nonacute.  Patient was started on IV insulin drip for management of DKA and type II diabetic as well as IV fluids for hypercalcemia. Hospital course complicated by blood cultures positive for MSSA and patient started on cefazolin, as well as worsening calcium greater than 15 for which patient was increased on IV fluids, given  calcitonin and zoledronic acid.  She was found to have respiratory failure, and repeat CXR and subsequently CT chest revealed pneumonia as well as new right apical lung mass.   Subjective: Seen and examined earlier this morning.  No major events overnight of this morning.  Main complaint seems to be dry throat and odynophagia.  Denies chest pain or dyspnea.  Denies GI or UTI symptoms.  Patient is not a great historian.   Objective: Vitals:   03/31/20 1745 03/31/20 2115 04/01/20 0532 04/01/20 1543  BP: 129/66 106/78 138/78 (!) 117/58  Pulse: 87 85 79 75  Resp: 20 17 18    Temp: 98.3 F (36.8 C) 98.2 F (36.8 C) 98 F (36.7 C) 98.4 F (36.9 C)  TempSrc: Oral  Oral Oral  SpO2: 95% 97% 98% 96%  Weight:      Height:        Intake/Output Summary (Last 24 hours) at 04/01/2020 1559 Last data filed at 04/01/2020 1535 Gross per 24 hour  Intake 520.5 ml  Output 400 ml  Net 120.5 ml   Filed Weights   03/22/2020 0952  Weight: 93.9 kg    Examination:  GENERAL: No apparent distress.  Nontoxic. HEENT: MMM.  No apparent oropharyngeal lesions.  Vision and hearing grossly intact.  NECK: Supple.  No apparent JVD.  RESP:  No IWOB.  Fair aeration bilaterally. CVS:  RRR. Heart sounds normal.  ABD/GI/GU: BS+. Abd soft, NTND.  MSK/EXT:  Moves extremities. No apparent deformity. No edema.  SKIN: no apparent skin lesion or wound NEURO: Awake, alert and oriented appropriately.  No apparent focal neuro deficit. PSYCH: Calm. Normal affect.   Procedures:  I&D of the  elbow by orthopedic surgery 1/10  Microbiology summarized: 1/9-blood cultures pansensitive staph auris. 1/11-blood cultures NGTD.  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 -S/p   -Surgical cultures showing staph aureus, pansensitive -Repeat blood culture 1/11 no growth  to date -TEE negative for endocarditis -ID, Dr. Gale Journey. recommended 6 weeks of IV cefazolin but unable to set up home health as patient is homeless and has no insurance.  An alternative is 4 weeks of IV antibiotics and two weeks of oral antibiotics (linezolid). -Antibiotics changed to cefepime  due to HCAP.  Acute hypoxemic respiratory failure secondary to HCAP: oxygen saturation dropped to 87% on room air -CT chest obtained which revealed right apical lung mass concerning for neoplasm, potentially with chest wall invasion as well as bilateral lower lobe infiltrates, no evidence of cavitary pneumonia/lung abscess, ill-defined peripancreatic tissue, left thyroid nodule -Antibiotics escalated to cefepime to cover HCAP -Once clinically stable, she will need to address right apical lung mass as well as thyroid nodule   Acute metabolic encephalopathy: some concern about paranoid thoughts, poor insight.  Probably in the setting of the above.   MRI brain without acute finding.  No focal neurodeficits. -Treat treatable causes. -Reorientation and delirium precaution  Right apical lung mass -May need CT-guided biopsy when she is able to consent.  Will discuss with IR  Ill-defined peripancreatic tissue -Lipase 60, no nausea, vomiting, epigastric abdominal pain  Left thyroid nodule -Outpatient thyroid US and biopsy   Hypernatremia: Resolved. -Resolved   Hypokalemia: Resolved. -Replace, trend  Metabolic acidosis  Severe hypercalcemia secondary to primary hyperparathyroidism: Hypercalcemia seems to have resolved. -Followed by endocrinology outpatient, referred for surgery -Calcium >15 on admission, status post bisphosphonate, Calcitonin, IVF  Weakness with recurrent falls -Likely secondary to ongoing hypercalcemia -MRI brain negative for any acute CNS involvement -PT eval recommending SNF placement  Type 2 diabetes mellitus with DKA: A1c 10.4%.  DKA resolved. Recent Labs  Lab  03/31/20 2009 03/31/20 2352 04/01/20 0354 04/01/20 0753 04/01/20 1156  GLUCAP 168* 146* 112* 120* 130*  -Continue Lantus, sliding scale insulin  AKI on CKD stage IIIb/azotemia: Baseline Cr 1.8>>> 2.77.  Seems prerenal in the setting of poor p.o. intake.  Was also on lisinopril/HCTZ.  Renal ultrasound without significant finding. -Gentle IV fluid -Avoid nephrotoxic meds -Repeat BMP in the morning  Acute on chronic normocytic anemia: Hgb 9.5 in 2020>> 7.7 (admit)>> 6.7>1u> 7.8>>6.0>1u>> 7.4> 7.1 -Follow Hemoccult -Check anemia panel  Hypertension: Normotensive -Continue to hold lisinopril/HCTZ.  Continue metoprolol  Hyperlipidemia -Continue Lipitor  Depression -Continue Zoloft  Sore throat/odynophagia -Chloraseptic spray and Magic mouthwash with lidocaine  Body mass index is 28.87 kg/m.         DVT prophylaxis:  SCDs Start: 03/15/2020 1608  Code Status: Full code Family Communication: Patient and/or RN.  No family at bedside. Status is: Inpatient  Remains inpatient appropriate because:Altered mental status, Unsafe d/c plan, IV treatments appropriate due to intensity of illness or inability to take PO and Inpatient level of care appropriate due to severity of illness   Dispo: The patient is from: Homeless               Anticipated d/c is to: SNF              Anticipated d/c date is: > 3 days  Patient currently is not medically stable to d/c.       Consultants:  Infectious disease Cardiology Orthopedic surgery   Sch Meds:  Scheduled Meds: . atorvastatin  40 mg Oral Daily  . docusate sodium  100 mg Oral BID  . fenofibrate  160 mg Oral Daily  . fluconazole  150 mg Oral Once  . insulin aspart  0-15 Units Subcutaneous Q4H  . insulin aspart  5 Units Subcutaneous TID WC  . insulin glargine  18 Units Subcutaneous QHS  . melatonin  3 mg Oral QHS  . metoprolol succinate  100 mg Oral Daily  . nystatin   Topical TID  . sertraline  100 mg Oral  Daily   Continuous Infusions: . 0.45 % NaCl with KCl 20 mEq / L 75 mL/hr at 04/01/20 0937  . [START ON 04/02/2020] ceFEPime (MAXIPIME) IV     PRN Meds:.dextrose, magic mouthwash w/lidocaine, metoCLOPramide **OR** metoCLOPramide (REGLAN) injection, ondansetron **OR** ondansetron (ZOFRAN) IV, phenol  Antimicrobials: Anti-infectives (From admission, onward)   Start     Dose/Rate Route Frequency Ordered Stop   04/02/20 0522  ceFEPIme (MAXIPIME) 2 g in sodium chloride 0.9 % 100 mL IVPB        2 g 200 mL/hr over 30 Minutes Intravenous Every 24 hours 04/01/20 0932     03/31/20 1800  ceFEPIme (MAXIPIME) 2 g in sodium chloride 0.9 % 100 mL IVPB  Status:  Discontinued        2 g 200 mL/hr over 30 Minutes Intravenous Every 12 hours 03/31/20 1244 04/01/20 0932   03/30/20 1030  piperacillin-tazobactam (ZOSYN) IVPB 3.375 g  Status:  Discontinued        3.375 g 12.5 mL/hr over 240 Minutes Intravenous Every 8 hours 03/30/20 1006 03/31/20 1236   03/29/20 1300  fluconazole (DIFLUCAN) tablet 150 mg        150 mg Oral  Once 03/29/20 1201     04/10/2020 1145  ceFAZolin (ANCEF) IVPB 2g/100 mL premix  Status:  Discontinued        2 g 200 mL/hr over 30 Minutes Intravenous Every 8 hours 04/02/2020 1055 03/30/20 0951   04/03/2020 1100  nafcillin 12 g in sodium chloride 0.9 % 500 mL continuous infusion  Status:  Discontinued        12 g 20.8 mL/hr over 24 Hours Intravenous Every 24 hours 04/03/2020 0949 04/10/2020 1055   03/22/20 1500  nafcillin 2 g in sodium chloride 0.9 % 100 mL IVPB  Status:  Discontinued        2 g 200 mL/hr over 30 Minutes Intravenous Every 4 hours 03/22/20 1354 04/04/2020 0949   03/22/20 1345  nafcillin injection 2 g  Status:  Discontinued        2 g Intravenous Every 4 hours 03/22/20 1344 03/22/20 1354   03/22/20 1300  ceFAZolin (ANCEF) IVPB 2g/100 mL premix  Status:  Discontinued        2 g 200 mL/hr over 30 Minutes Intravenous Every 8 hours 03/22/20 1257 03/22/20 1344       I have  personally reviewed the following labs and images: CBC: Recent Labs  Lab 03/29/20 0518 03/29/20 2303 03/30/20 0507 03/31/20 0710 03/31/20 2045 04/01/20 0526  WBC 19.6* 20.2* 20.5* 16.6*  --  17.5*  NEUTROABS  --  19.2*  --   --   --   --   HGB 8.0* 7.8* 8.2* 6.0* 7.4* 7.1*  HCT 25.1* 24.1* 24.4* 19.5* 22.9* 22.8*  MCV 85.4 84.3  84.1 87.8  --  87.0  PLT 305 350 322 287  --  325   BMP &GFR Recent Labs  Lab 03/26/20 0611 03/26/20 1732 03/27/20 0605 03/28/20 0705 03/29/20 0518 03/30/20 0507 03/31/20 0710 04/01/20 0526  NA 138  --  139 139 142 149* 143 144  K 3.4*  --  3.2* 3.1* 3.4* 3.3* 3.2* 3.6  CL 107  --  109 109 111 115* 114* 115*  CO2 18*  --  19* 15* 13* 19* 18* 17*  GLUCOSE 339*   < > 309* 238* 300* 172* 188* 116*  BUN 35*  --  40* 42* 47* 49* 60* 74*  CREATININE 1.68*  --  1.69* 1.76* 1.81* 1.73* 2.09* 2.77*  CALCIUM 9.4  --  9.0 8.8* 9.1 9.6 9.1 9.4  MG 1.7  --  1.8  --   --   --  1.8 1.8   < > = values in this interval not displayed.   Estimated Creatinine Clearance: 25.9 mL/min (A) (by C-G formula based on SCr of 2.77 mg/dL (H)). Liver & Pancreas: No results for input(s): AST, ALT, ALKPHOS, BILITOT, PROT, ALBUMIN in the last 168 hours. Recent Labs  Lab 03/30/20 1311  LIPASE 60*   No results for input(s): AMMONIA in the last 168 hours. Diabetic: No results for input(s): HGBA1C in the last 72 hours. Recent Labs  Lab 03/31/20 2009 03/31/20 2352 04/01/20 0354 04/01/20 0753 04/01/20 1156  GLUCAP 168* 146* 112* 120* 130*   Cardiac Enzymes: No results for input(s): CKTOTAL, CKMB, CKMBINDEX, TROPONINI in the last 168 hours. No results for input(s): PROBNP in the last 8760 hours. Coagulation Profile: No results for input(s): INR, PROTIME in the last 168 hours. Thyroid Function Tests: No results for input(s): TSH, T4TOTAL, FREET4, T3FREE, THYROIDAB in the last 72 hours. Lipid Profile: No results for input(s): CHOL, HDL, LDLCALC, TRIG, CHOLHDL,  LDLDIRECT in the last 72 hours. Anemia Panel: No results for input(s): VITAMINB12, FOLATE, FERRITIN, TIBC, IRON, RETICCTPCT in the last 72 hours. Urine analysis:    Component Value Date/Time   COLORURINE YELLOW 03/30/2020 1330   APPEARANCEUR CLOUDY (A) 03/30/2020 1330   LABSPEC 1.020 03/30/2020 1330   PHURINE 5.0 03/30/2020 1330   GLUCOSEU 150 (A) 03/30/2020 1330   HGBUR SMALL (A) 03/30/2020 1330   BILIRUBINUR NEGATIVE 03/30/2020 1330   BILIRUBINUR Neg 11/21/2014 1158   KETONESUR 5 (A) 03/30/2020 1330   PROTEINUR 100 (A) 03/30/2020 1330   UROBILINOGEN 0.2 01/04/2015 1856   NITRITE NEGATIVE 03/30/2020 1330   LEUKOCYTESUR LARGE (A) 03/30/2020 1330   Sepsis Labs: Invalid input(s): PROCALCITONIN, [place]  Microbiology: Recent Results (from the past 240 hour(s))  Surgical PCR screen     Status: Abnormal   Collection Time: 03/16/2020 10:08 AM   Specimen: Nasal Mucosa; Nasal Swab  Result Value Ref Range Status   MRSA, PCR NEGATIVE NEGATIVE Final   Staphylococcus aureus POSITIVE (A) NEGATIVE Final    Comment: (NOTE) The Xpert SA Assay (FDA approved for NASAL specimens in patients 57 years of age and older), is one component of a comprehensive surveillance program. It is not intended to diagnose infection nor to guide or monitor treatment. Performed at [facility], [place] [address], [place], [place] [address]   Anaerobic culture     Status: None   Collection Time: 04/04/2020  1:42 PM   Specimen: PATH Other  Result Value Ref Range Status   Specimen Description   Final    FLUID  LEFT OLECRANON  Performed at [facility], [place] [address], [place], [place] [address]    Special Requests   Final    NONE Performed at [facility], [place] [address], [place], [place] [address]    Culture   Final    NO ANAEROBES ISOLATED Performed at [facility], [place] [address] [place], [place] [address]    Report Status 03/28/2020 FINAL   Final  Aerobic Culture (superficial specimen)     Status: None   Collection Time: 04/04/2020  1:42 PM   Specimen: PATH Other  Result Value Ref Range Status   Specimen Description   Final    FLUID  LEFT OLECRANON Performed at [place] [address], [place], [place] [address]    Special Requests   Final    NONE Performed at [facility], [place] [address], [place], [place] [address]    Gram Stain   Final    NO WBC SEEN RARE GRAM POSITIVE COCCI Performed at [facility], [place] [address], [place], [place] [address]    Culture FEW STAPHYLOCOCCUS AUREUS  Final   Report Status 03/26/2020 FINAL  Final   Organism ID, Bacteria STAPHYLOCOCCUS AUREUS  Final      Susceptibility   Staphylococcus aureus - MIC*    CIPROFLOXACIN <=0.5 SENSITIVE Sensitive     ERYTHROMYCIN <=0.25 SENSITIVE Sensitive     GENTAMICIN <=0.5 SENSITIVE Sensitive     OXACILLIN 0.5 SENSITIVE Sensitive     TETRACYCLINE <=1 SENSITIVE Sensitive     VANCOMYCIN 1 SENSITIVE Sensitive     TRIMETH/SULFA <=10 SENSITIVE Sensitive     CLINDAMYCIN <=0.25 SENSITIVE Sensitive     RIFAMPIN <=0.5 SENSITIVE Sensitive     Inducible Clindamycin NEGATIVE Sensitive     * FEW STAPHYLOCOCCUS AUREUS  Aerobic/Anaerobic Culture (surgical/deep wound)     Status: None   Collection Time: 03/15/2020  1:46 PM   Specimen: PATH Soft tissue  Result Value Ref Range Status   Specimen Description   Final    TISSUE LEFT OLECRANON INFECTED Performed at [place] [address], [place], [place] [address]    Special Requests   Final    NONE Performed at [facility], [place] [address], [place], [place] [address]    Gram Stain   Final    RARE WBC PRESENT, PREDOMINANTLY PMN RARE GRAM POSITIVE COCCI    Culture   Final    FEW STAPHYLOCOCCUS AUREUS NO ANAEROBES ISOLATED Performed at [facility], [place] [address] [place], [place] [address]    Report Status  03/28/2020 FINAL  Final   Organism ID, Bacteria STAPHYLOCOCCUS AUREUS  Final      Susceptibility   Staphylococcus aureus - MIC*    CIPROFLOXACIN <=0.5 SENSITIVE Sensitive     ERYTHROMYCIN <=0.25 SENSITIVE Sensitive     GENTAMICIN <=0.5 SENSITIVE Sensitive     OXACILLIN 0.5 SENSITIVE Sensitive     TETRACYCLINE <=1 SENSITIVE Sensitive     VANCOMYCIN 1 SENSITIVE Sensitive     TRIMETH/SULFA <=10 SENSITIVE Sensitive     CLINDAMYCIN <=0.25 SENSITIVE Sensitive     RIFAMPIN <=0.5 SENSITIVE Sensitive     Inducible Clindamycin NEGATIVE Sensitive     * FEW STAPHYLOCOCCUS AUREUS  Culture, blood (routine x 2)     Status: None   Collection Time: 03/18/2020 12:00 AM   Specimen: BLOOD  Result Value Ref Range Status   Specimen Description   Final  BLOOD RIGHT ANTECUBITAL Performed at [place] [address], [place], [place] [address]    Special Requests   Final    BOTTLES DRAWN AEROBIC ONLY Blood Culture results may not be optimal due to an inadequate volume of blood received in culture bottles Performed at [place] [address], [place], [place] [address]    Culture   Final    NO GROWTH 5 DAYS Performed at [facility], [place] [address] [place], [place] [address]    Report Status 03/29/2020 FINAL  Final  Culture, blood (routine x 2)     Status: None   Collection Time: 04/06/2020 12:00 AM   Specimen: BLOOD LEFT HAND  Result Value Ref Range Status   Specimen Description   Final    BLOOD LEFT HAND Performed at [place] [address] [place], [place] [address]    Special Requests   Final    BOTTLES DRAWN AEROBIC ONLY Blood Culture results may not be optimal due to an inadequate volume of blood received in culture bottles Performed at [place] [address], [place], [place] [address]    Culture   Final    NO GROWTH 5 DAYS Performed at [facility], [place] [address], [place],  [place] [address]    Report Status 03/29/2020 FINAL  Final  Culture, blood (routine x 2)     Status: None (Preliminary result)   Collection Time: 03/29/20 10:51 AM   Specimen: BLOOD  Result Value Ref Range Status   Specimen Description   Final    BLOOD BLOOD RIGHT HAND Performed at [place] [address] [place], [place] [address]    Special Requests   Final    BOTTLES DRAWN AEROBIC AND ANAEROBIC Blood Culture adequate volume Performed at [place] [address], [place], [place] [address]    Culture   Final    NO GROWTH 3 DAYS Performed at [facility], [place] [address] [place], [place] [address]    Report Status PENDING  Incomplete  Culture, blood (routine x 2)     Status: None (Preliminary result)   Collection Time: 03/29/20 10:52 AM   Specimen: BLOOD  Result Value Ref Range Status   Specimen Description   Final    BLOOD BLOOD RIGHT HAND Performed at [place] [address] [place], [place] [address]    Special Requests   Final    BOTTLES DRAWN AEROBIC ONLY Blood Culture results may not be optimal due to an inadequate volume of blood received in culture bottles Performed at [place] [address], [place], [place] [address]    Culture   Final    NO GROWTH 3 DAYS Performed at [facility], [place] [address], [place], [place] [address]    Report Status PENDING  Incomplete  Culture, Urine     Status: Abnormal   Collection Time: 03/30/20  1:30 PM   Specimen: Urine, Clean Catch  Result Value Ref Range Status   Specimen Description   Final    URINE, CLEAN CATCH Performed at [facility], [place] [address] [place], [place] [address]    Special Requests   Final    NONE Performed at [facility], [place] [address], [place],  [address]    Culture >=100,000 COLONIES/mL YEAST (A)  Final   Report Status 04/01/2020 FINAL  Final     Radiology Studies: VAS [place] UPPER EXTREMITY VENOUS DUPLEX  Result Date: 04/01/2020 UPPER VENOUS STUDY  Indications: Edema Risk Factors: None identified. Comparison Study: No prior studies. Performing Technologist: Oliver Hum RVT  Examination Guidelines: A complete evaluation includes B-mode imaging, spectral Doppler, color Doppler, and power Doppler as needed of all accessible portions of each vessel. Bilateral testing is considered an integral part of a complete examination. Limited examinations for reoccurring indications may be performed as noted.  Right Findings: +----------+------------+---------+-----------+----------+-------+ RIGHT     CompressiblePhasicitySpontaneousPropertiesSummary +----------+------------+---------+-----------+----------+-------+ IJV           Full       Yes       Yes                      +----------+------------+---------+-----------+----------+-------+ Subclavian    Full       Yes       Yes                      +----------+------------+---------+-----------+----------+-------+ Axillary      Full       Yes       Yes                      +----------+------------+---------+-----------+----------+-------+ Brachial      Full       Yes       Yes                      +----------+------------+---------+-----------+----------+-------+ Radial        Full                                          +----------+------------+---------+-----------+----------+-------+ Ulnar         Full                                          +----------+------------+---------+-----------+----------+-------+ Cephalic      None                                   Acute  +----------+------------+---------+-----------+----------+-------+ Basilic       Full                                          +----------+------------+---------+-----------+----------+-------+  Left Findings: +----------+------------+---------+-----------+----------+-------+ LEFT       CompressiblePhasicitySpontaneousPropertiesSummary +----------+------------+---------+-----------+----------+-------+ Subclavian    Full       Yes       Yes                      +----------+------------+---------+-----------+----------+-------+  Summary:  Right: No evidence of deep vein thrombosis in the upper extremity. Findings consistent with acute superficial vein thrombosis involving the right cephalic vein.  Left: No evidence of thrombosis in the subclavian.  *See table(s) above for measurements and observations.    Preliminary      Emari Hreha T. [place]  If 7PM-7AM, please contact night-coverage [email or web address] 04/01/2020, 3:59 PM

## 2020-04-02 DIAGNOSIS — Y95 Nosocomial condition: Secondary | ICD-10-CM

## 2020-04-02 DIAGNOSIS — R040 Epistaxis: Secondary | ICD-10-CM

## 2020-04-02 DIAGNOSIS — D62 Acute posthemorrhagic anemia: Secondary | ICD-10-CM

## 2020-04-02 DIAGNOSIS — J189 Pneumonia, unspecified organism: Secondary | ICD-10-CM

## 2020-04-02 LAB — RENAL FUNCTION PANEL
Albumin: 1.4 g/dL — ABNORMAL LOW (ref 3.5–5.0)
Anion gap: 12 (ref 5–15)
BUN: 80 mg/dL — ABNORMAL HIGH (ref 8–23)
CO2: 14 mmol/L — ABNORMAL LOW (ref 22–32)
Calcium: 9.4 mg/dL (ref 8.9–10.3)
Chloride: 113 mmol/L — ABNORMAL HIGH (ref 98–111)
Creatinine, Ser: 3.08 mg/dL — ABNORMAL HIGH (ref 0.44–1.00)
GFR, Estimated: 16 mL/min — ABNORMAL LOW (ref >60)
Glucose, Bld: 97 mg/dL (ref 70–99)
Phosphorus: 4 mg/dL (ref 2.5–4.6)
Potassium: 3.8 mmol/L (ref 3.5–5.1)
Sodium: 139 mmol/L (ref 135–145)

## 2020-04-02 LAB — MAGNESIUM: Magnesium: 1.5 mg/dL — ABNORMAL LOW (ref 1.7–2.4)

## 2020-04-02 LAB — CBC
HCT: 21.5 % — ABNORMAL LOW (ref 36.0–46.0)
Hemoglobin: 6.4 g/dL — CL (ref 12.0–15.0)
MCH: 26.7 pg (ref 26.0–34.0)
MCHC: 29.8 g/dL — ABNORMAL LOW (ref 30.0–36.0)
MCV: 89.6 fL (ref 80.0–100.0)
Platelets: 321 10*3/uL (ref 150–400)
RBC: 2.4 MIL/uL — ABNORMAL LOW (ref 3.87–5.11)
RDW: 17.9 % — ABNORMAL HIGH (ref 11.5–15.5)
WBC: 16.2 10*3/uL — ABNORMAL HIGH (ref 4.0–10.5)
nRBC: 0 % (ref 0.0–0.2)

## 2020-04-02 LAB — GLUCOSE, CAPILLARY
Glucose-Capillary: 100 mg/dL — ABNORMAL HIGH (ref 70–99)
Glucose-Capillary: 103 mg/dL — ABNORMAL HIGH (ref 70–99)
Glucose-Capillary: 128 mg/dL — ABNORMAL HIGH (ref 70–99)
Glucose-Capillary: 86 mg/dL (ref 70–99)
Glucose-Capillary: 86 mg/dL (ref 70–99)
Glucose-Capillary: 95 mg/dL (ref 70–99)
Glucose-Capillary: 96 mg/dL (ref 70–99)

## 2020-04-02 LAB — PREPARE RBC (CROSSMATCH)

## 2020-04-02 LAB — PREALBUMIN: Prealbumin: 6.8 mg/dL — ABNORMAL LOW (ref 18–38)

## 2020-04-02 LAB — APTT: aPTT: 102 seconds — ABNORMAL HIGH (ref 24–36)

## 2020-04-02 LAB — HEMOGLOBIN AND HEMATOCRIT, BLOOD
HCT: 22.5 % — ABNORMAL LOW (ref 36.0–46.0)
Hemoglobin: 7.1 g/dL — ABNORMAL LOW (ref 12.0–15.0)

## 2020-04-02 MED ORDER — SODIUM BICARBONATE 650 MG PO TABS
650.0000 mg | ORAL_TABLET | Freq: Three times a day (TID) | ORAL | Status: DC
  Administered 2020-04-02 – 2020-04-19 (×27): 650 mg via ORAL
  Filled 2020-04-02 (×42): qty 1

## 2020-04-02 MED ORDER — MAGNESIUM SULFATE 2 GM/50ML IV SOLN
2.0000 g | Freq: Once | INTRAVENOUS | Status: AC
  Administered 2020-04-02: 2 g via INTRAVENOUS
  Filled 2020-04-02: qty 50

## 2020-04-02 MED ORDER — ZINC OXIDE 40 % EX OINT
TOPICAL_OINTMENT | CUTANEOUS | Status: DC | PRN
  Administered 2020-04-08: 1 via TOPICAL
  Filled 2020-04-02 (×5): qty 57

## 2020-04-02 MED ORDER — PHYTONADIONE 5 MG PO TABS
5.0000 mg | ORAL_TABLET | Freq: Once | ORAL | Status: AC
  Administered 2020-04-02: 5 mg via ORAL
  Filled 2020-04-02: qty 1

## 2020-04-02 MED ORDER — SODIUM CHLORIDE 0.9% IV SOLUTION: Freq: Once | INTRAVENOUS | Status: AC

## 2020-04-02 NOTE — Plan of Care (Signed)
  Problem: Clinical Measurements: Goal: Diagnostic test results will improve Outcome: Progressing Goal: Respiratory complications will improve Outcome: Progressing   

## 2020-04-02 NOTE — Progress Notes (Signed)
Sutures removed from left elbow incision per order. Steri strips applied.  [place] Jaquith, Laurel Dimmer, RN

## 2020-04-02 NOTE — Plan of Care (Signed)

## 2020-04-02 NOTE — Progress Notes (Signed)
PROGRESS NOTE  DISA RIEDLINGER KXF:[id number] DOB: 1955/08/04   PCP: Ann Held, DO  Patient is from: Homeless  DOA: 03/23/2020 LOS: 83  Chief complaints: Fall  Brief Narrative / Interim history: 65 year old female with PMH of uncontrolled DM-2, chronic hypercalcemia presumed to be due to primary hypothyroidism, bipolar disorder, depression, HTN, HLD and recurrent falls brought to ED by EMS after fall at her hotel room and admitted for DKA, hypercalcemia and sepsis in the setting of MSSA bacteremia, left olecranon bursitis and osteomyelitis.  She underwent I&D of the left elbow by orthopedic surgery on 1/10.  TEE negative for endocarditis.  Infectious disease recommended IV antibiotics for 6 weeks.  Therapy recommended SNF but patient has no insurance.  Cannot be discharged home with IV antibiotics as she is homeless.  Hospital course complicated acute respiratory failure due to hospital-acquired pneumonia, blood loss anemia due to recurrent epistaxis in the setting of coagulopathy, and AKI.  Subjective: Seen and examined earlier this morning.  She had an episode of epistaxis requiring nasal packing.  PT/PTT/INR elevated.  Hemoglobin dropped to 6.4.  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  Hemoccult negative.  Objective: Vitals:   04/01/20 2137 04/02/20 0058 04/02/20 1315 04/02/20 1340  BP: 114/64 135/78 121/64 121/67  Pulse: 83 81 87 88  Resp: 18 15 16 18   Temp: 99.1 F (37.3 C) 98.5 F (36.9 C) 98.6 F (37 C) 97.6 F (36.4 C)  TempSrc: Oral Oral Oral Oral  SpO2: 96% 95% 97% 98%  Weight:      Height:        Intake/Output Summary (Last 24 hours) at 04/02/2020 1436 Last data filed at 04/02/2020 1340 Gross per 24 hour  Intake 1023.17 ml  Output 200 ml  Net 823.17 ml   Filed Weights   03/22/2020 0952  Weight: 93.9 kg    Examination:  GENERAL: No apparent distress.  Nontoxic. HEENT: MMM.  No apparent oropharyngeal lesions  or bleeding.  Vision and hearing grossly intact. Loose scab with some dry blood in her left nares but no bleeding. NECK: Supple.  No apparent JVD.  RESP: On RA.  No IWOB.  Fair aeration bilaterally. CVS:  RRR. Heart sounds normal.  ABD/GI/GU: BS+. Abd soft, NTND. Fecal matter coming out of vagina. MSK/EXT:  Moves extremities. No apparent deformity. No edema.  SKIN: no apparent skin lesion or wound NEURO: Awake, alert and oriented appropriately.  No apparent focal neuro deficit. PSYCH: Calm. Normal affect.   Patient's RN Brien Mates available as chaperone during GU exam.  Procedures:  I&D of the elbow by orthopedic surgery 1/10.  Called  Microbiology summarized: 1/9-blood cultures pansensitive staph auris. 1/11-blood cultures NGTD.  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 -S/p I&D of left elbow paraplegic surgery 1/10 -Surgical culture grew pansensitive staph auris. -Repeat blood culture 1/11 no growth to date -TEE negative for endocarditis -ID, Dr. Gale Journey. recommended 6 weeks of IV cefazolin but unable to set up home health as patient is homeless and has no insurance.  An alternative is 4 weeks of IV antibiotics and two weeks of oral antibiotics (linezolid). 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  Probably in the setting of the above.   MRI brain without acute finding.  No focal neurodeficits. -Treat treatable causes. -Reorientation and delirium precaution  Right apical lung mass -Consult IR for possible CT-guided biopsy.   Ill-defined peripancreatic tissue -Lipase 60, no nausea, vomiting, epigastric abdominal pain  Left thyroid nodule -Outpatient thyroid US and biopsy   AKI on CKD stage IIIb/azotemia: Baseline Cr 1.8>>> 2.77>3.08.  BUN 38>> 74>> 80.  Seems prerenal in the setting of poor p.o. intake.  Was also on lisinopril/HCTZ.  Renal ultrasound without significant finding. -Continue gentle IV fluid.  Rising creatinine seems to be slowing. -Avoid nephrotoxic meds -Repeat BMP in the morning  Hypernatremia: Resolved. -Resolved   Hypokalemia/hypomagnesemia: Hypokalemia resolved. -Replace magnesium  Metabolic acidosis: Likely due to renal failure. -Start sodium bicarbonate -Continue monitoring  Severe hypercalcemia secondary to primary hyperparathyroidism: Hypercalcemia seems to have resolved. -Followed by endocrinology outpatient, referred for surgery -Calcium >15 on admission, status post bisphosphonate, Calcitonin, IVF  Weakness with recurrent falls: Multifactorial including hypercalcemia, sepsis, uncontrolled diabetes, anemia and possibly polypharmacy. MRI brain negative for any acute CNS involvement -PT eval recommending SNF placement  Type 2 diabetes mellitus with DKA: A1c 10.4%.  DKA resolved. Recent Labs  Lab 04/01/20 2134 04/02/20 0057 04/02/20 0531 04/02/20 0741 04/02/20 1237  GLUCAP 102* 100* 86 86 103*  -Continue Lantus, sliding scale insulin  Acute on chronic normocytic anemia: Hgb 9.5 in 2020>> 7.7 (admit)>> 6.7>1u> 7.8>>6.0>1u>> 7.4> 7.1> 6.4.  Drop likely due to epistaxis and dilutional effect from  IV fluid.  She also seems to have coagulopathy.  Fecal Hemoccult negative. -Transfuse 1 unit -P.o. vitamin K 5 mg x 1 -Repeat coag labs (PT/PTT/INR).  If elevated, will give 1 unit of FFP -Check anemia panel  Recurrent epistaxis-in the setting of coagulopathy.  Seems to have subsided.  Encouraged to stop picking at her nose. -Check coagulopathy labs-if elevated FFP above -ENT consult if further epistaxis after FFP.  Hypertension: Normotensive -Continue to hold lisinopril/HCTZ.  Continue metoprolol  Hyperlipidemia -Continue Lipitor  Depression: Stable -Continue Zoloft  Sore throat/odynophagia -Chloraseptic spray and Magic mouthwash with lidocaine  Superficial DVT involving the right cephalic vein -No indication for treatment, especially in the setting of coagulopathy.  Body mass index is 28.87 kg/m.         DVT prophylaxis:  SCDs Start: 03/20/2020 1608  Code Status: Full code Family Communication: Patient and/or RN.  No family at bedside. Status is: Inpatient  Remains inpatient appropriate because:Altered mental status, Unsafe d/c plan, IV treatments appropriate due to intensity of illness or inability to take PO and Inpatient level of care appropriate due to severity of illness   Dispo: The patient is from: Homeless               Anticipated d/c is to: SNF              Anticipated d/c date is: > 3 days              Patient currently is not medically stable to d/c.       Consultants:  Infectious disease Cardiology  Orthopedic surgery   Sch Meds:  Scheduled Meds: . atorvastatin  40 mg Oral Daily  . docusate sodium  100 mg Oral BID  . fenofibrate  160 mg Oral Daily  . fluconazole  150 mg Oral Once  . insulin aspart  0-15 Units Subcutaneous Q4H  . insulin aspart  5 Units Subcutaneous TID WC  . insulin glargine  18 Units Subcutaneous QHS  . melatonin  3 mg Oral QHS  . metoprolol succinate  100 mg Oral Daily  . nystatin   Topical TID  . sertraline  100  mg Oral Daily   Continuous Infusions: . 0.45 % NaCl with KCl 20 mEq / L 75 mL/hr at 04/02/20 0326  . ceFEPime (MAXIPIME) IV 2 g (04/02/20 0535)   PRN Meds:.dextrose, magic mouthwash w/lidocaine, metoCLOPramide **OR** metoCLOPramide (REGLAN) injection, ondansetron **OR** ondansetron (ZOFRAN) IV, oxymetazoline, phenol  Antimicrobials: Anti-infectives (From admission, onward)   Start     Dose/Rate Route Frequency Ordered Stop   04/02/20 0522  ceFEPIme (MAXIPIME) 2 g in sodium chloride 0.9 % 100 mL IVPB        2 g 200 mL/hr over 30 Minutes Intravenous Every 24 hours 04/01/20 0932     03/31/20 1800  ceFEPIme (MAXIPIME) 2 g in sodium chloride 0.9 % 100 mL IVPB  Status:  Discontinued        2 g 200 mL/hr over 30 Minutes Intravenous Every 12 hours 03/31/20 1244 04/01/20 0932   03/30/20 1030  piperacillin-tazobactam (ZOSYN) IVPB 3.375 g  Status:  Discontinued        3.375 g 12.5 mL/hr over 240 Minutes Intravenous Every 8 hours 03/30/20 1006 03/31/20 1236   03/29/20 1300  fluconazole (DIFLUCAN) tablet 150 mg        150 mg Oral  Once 03/29/20 1201     03/16/2020 1145  ceFAZolin (ANCEF) IVPB 2g/100 mL premix  Status:  Discontinued        2 g 200 mL/hr over 30 Minutes Intravenous Every 8 hours 04/01/2020 1055 03/30/20 0951   03/17/2020 1100  nafcillin 12 g in sodium chloride 0.9 % 500 mL continuous infusion  Status:  Discontinued        12 g 20.8 mL/hr over 24 Hours Intravenous Every 24 hours 03/16/2020 0949 04/07/2020 1055   03/22/20 1500  nafcillin 2 g in sodium chloride 0.9 % 100 mL IVPB  Status:  Discontinued        2 g 200 mL/hr over 30 Minutes Intravenous Every 4 hours 03/22/20 1354 03/17/2020 0949   03/22/20 1345  nafcillin injection 2 g  Status:  Discontinued        2 g Intravenous Every 4 hours 03/22/20 1344 03/22/20 1354   03/22/20 1300  ceFAZolin (ANCEF) IVPB 2g/100 mL premix  Status:  Discontinued        2 g 200 mL/hr over 30 Minutes Intravenous Every 8 hours 03/22/20 1257 03/22/20 1344        I have personally reviewed the following labs and images: CBC: Recent Labs  Lab 03/29/20 2303 03/30/20 0507 03/31/20 0710 03/31/20 2045 04/01/20 0526 04/02/20 0720  WBC 20.2* 20.5* 16.6*  --  17.5* 16.2*  NEUTROABS 19.2*  --   --   --   --   --   HGB 7.8* 8.2* 6.0* 7.4* 7.1* 6.4*  HCT 24.1* 24.4* 19.5* 22.9* 22.8* 21.5*  MCV 84.3 84.1 87.8  --  87.0 89.6  PLT 350 322 287  --  325 321   BMP &  GFR Recent Labs  Lab 03/27/20 0605 03/28/20 0705 03/29/20 0518 03/30/20 0507 03/31/20 0710 04/01/20 0526 04/02/20 0720  NA 139   < > 142 149* 143 144 139  K 3.2*   < > 3.4* 3.3* 3.2* 3.6 3.8  CL 109   < > 111 115* 114* 115* 113*  CO2 19*   < > 13* 19* 18* 17* 14*  GLUCOSE 309*   < > 300* 172* 188* 116* 97  BUN 40*   < > 47* 49* 60* 74* 80*  CREATININE 1.69*   < > 1.81* 1.73* 2.09* 2.77* 3.08*  CALCIUM 9.0   < > 9.1 9.6 9.1 9.4 9.4  MG 1.8  --   --   --  1.8 1.8 1.5*  PHOS  --   --   --   --   --   --  4.0   < > = values in this interval not displayed.   Estimated Creatinine Clearance: 23.3 mL/min (A) (by C-G formula based on SCr of 3.08 mg/dL (H)). Liver & Pancreas: Recent Labs  Lab 04/02/20 0720  ALBUMIN 1.4*   Recent Labs  Lab 03/30/20 1311  LIPASE 60*   No results for input(s): AMMONIA in the last 168 hours. Diabetic: No results for input(s): HGBA1C in the last 72 hours. Recent Labs  Lab 04/01/20 2134 04/02/20 0057 04/02/20 0531 04/02/20 0741 04/02/20 1237  GLUCAP 102* 100* 86 86 103*   Cardiac Enzymes: No results for input(s): CKTOTAL, CKMB, CKMBINDEX, TROPONINI in the last 168 hours. No results for input(s): PROBNP in the last 8760 hours. Coagulation Profile: Recent Labs  Lab 04/01/20 2017  INR 3.8*   Thyroid Function Tests: No results for input(s): TSH, T4TOTAL, FREET4, T3FREE, THYROIDAB in the last 72 hours. Lipid Profile: No results for input(s): CHOL, HDL, LDLCALC, TRIG, CHOLHDL, LDLDIRECT in the last 72 hours. Anemia Panel: No results  for input(s): VITAMINB12, FOLATE, FERRITIN, TIBC, IRON, RETICCTPCT in the last 72 hours. Urine analysis:    Component Value Date/Time   COLORURINE YELLOW 03/30/2020 1330   APPEARANCEUR CLOUDY (A) 03/30/2020 1330   LABSPEC 1.020 03/30/2020 1330   PHURINE 5.0 03/30/2020 1330   GLUCOSEU 150 (A) 03/30/2020 1330   HGBUR SMALL (A) 03/30/2020 1330   BILIRUBINUR NEGATIVE 03/30/2020 1330   BILIRUBINUR Neg 11/21/2014 1158   KETONESUR 5 (A) 03/30/2020 1330   PROTEINUR 100 (A) 03/30/2020 1330   UROBILINOGEN 0.2 01/04/2015 1856   NITRITE NEGATIVE 03/30/2020 1330   LEUKOCYTESUR LARGE (A) 03/30/2020 1330   Sepsis Labs: Invalid input(s): PROCALCITONIN, [place]  Microbiology: Recent Results (from the past 240 hour(s))  Culture, blood (routine x 2)     Status: None   Collection Time: 03/16/2020 12:00 AM   Specimen: BLOOD  Result Value Ref Range Status   Specimen Description   Final    BLOOD RIGHT ANTECUBITAL Performed at [facility] [address] [place], [place] [address]    Special Requests   Final    BOTTLES DRAWN AEROBIC ONLY Blood Culture results may not be optimal due to an inadequate volume of blood received in culture bottles Performed at [facility] [address] [place], [place] [address]    Culture   Final    NO GROWTH 5 DAYS Performed at [facility], [place] [address] [place], [place] [address]    Report Status 03/29/2020 FINAL  Final  Culture, blood (routine x 2)     Status: None   Collection  Time: 03/21/2020 12:00 AM   Specimen: BLOOD LEFT HAND  Result Value Ref Range Status   Specimen Description   Final    BLOOD LEFT HAND Performed at [facility] [address], [place], [place] [address]    Special Requests   Final    BOTTLES DRAWN AEROBIC ONLY Blood Culture results may not be optimal due to an inadequate volume of blood received in culture bottles Performed at [facility] [address] [place], [place] [address]    Culture   Final    NO GROWTH 5 DAYS Performed at [facility], [place] [address], [place], [place] [address]    Report Status 03/29/2020 FINAL  Final  Culture, blood (routine x 2)     Status: None (Preliminary result)   Collection Time: 03/29/20 10:51 AM   Specimen: BLOOD  Result Value Ref Range Status   Specimen Description   Final    BLOOD BLOOD RIGHT HAND Performed at [facility] [address], [place], [place] [address]    Special Requests   Final    BOTTLES DRAWN AEROBIC AND ANAEROBIC Blood Culture adequate volume Performed at [facility] [address] [place], [place] [address]    Culture   Final    NO GROWTH 4 DAYS Performed at [facility], [place] [address], [place], [place] [address]    Report Status PENDING  Incomplete  Culture, blood (routine x 2)     Status: None (Preliminary result)   Collection Time: 03/29/20 10:52 AM   Specimen: BLOOD  Result Value Ref Range Status   Specimen Description   Final    BLOOD BLOOD RIGHT HAND Performed at [facility] [address], [place], [place] [address]    Special Requests   Final    BOTTLES DRAWN AEROBIC ONLY Blood Culture results may not be optimal due to an inadequate volume of blood received in culture bottles Performed at [facility] [address], [place], [place] [address]    Culture   Final    NO GROWTH 4 DAYS Performed at [facility], [place] [address] [place], [place] [address]    Report Status PENDING  Incomplete  Culture, Urine     Status: Abnormal   Collection Time: 03/30/20  1:30 PM   Specimen: Urine, Clean Catch  Result Value Ref Range Status   Specimen Description   Final    URINE, CLEAN CATCH Performed at [facility], [place] [address], [place], [place] [address]    Special Requests   Final    NONE Performed at [facility],  [place] [address], [place], [place] [address]    Culture >=100,000 COLONIES/mL YEAST (A)  Final   Report Status 04/01/2020 FINAL  Final    Radiology Studies: No results found.   Hartley Wyke T. [place]  If 7PM-7AM, please contact night-coverage [email or web address] 04/02/2020, 2:36 PM

## 2020-04-02 NOTE — Progress Notes (Signed)
Pt inserted her contact lenses. Nurse and tech discussed with  Patient importance of proper hygiene prior to inserting and removing contacts.  Trejan Buda, [place], RN

## 2020-04-02 NOTE — Progress Notes (Signed)
   PATIENT ID: Deborah Figueroa   9 Days Post-Op Procedure(s) (LRB): TRANSESOPHAGEAL ECHOCARDIOGRAM (TEE) (N/A)  Subjective: Doing well and denies left elbow pain 10 days s/p left elbow I and D.   Objective:  Vitals:   04/01/20 2137 04/02/20 0058  BP: 114/64 135/78  Pulse: 83 81  Resp: 18 15  Temp: 99.1 F (37.3 C) 98.5 F (36.9 C)  SpO2: 96% 95%     L elbow incision benign, no surrounding erythema, warmth or swelling Full AROM of left elbow with no pain, nontender to palpation Unable to extend elbow/activate triceps against resistance Distally NVI  Labs:  Recent Labs    03/31/20 0710 03/31/20 2045 04/01/20 0526 04/02/20 0720  HGB 6.0* 7.4* 7.1* 6.4*   Recent Labs    04/01/20 0526 04/02/20 0720  WBC 17.5* 16.2*  RBC 2.62* 2.40*  HCT 22.8* 21.5*  PLT 325 321   Recent Labs    03/31/20 0710 04/01/20 0526  NA 143 144  K 3.2* 3.6  CL 114* 115*  CO2 18* 17*  BUN 60* 74*  CREATININE 2.09* 2.77*  GLUCOSE 188* 116*  CALCIUM 9.1 9.4    Assessment and Plan: Doing well with left elbow wound after I and D abx per infectious disease Nursing order in to remove sutures Can follow up outpatient at [facility] with d/ced from hospital for eval on function, may not be able to obtain triceps activation after this

## 2020-04-03 LAB — APTT: aPTT: 74 seconds — ABNORMAL HIGH (ref 24–36)

## 2020-04-03 LAB — CBC
HCT: 20.1 % — ABNORMAL LOW (ref 36.0–46.0)
Hemoglobin: 6.6 g/dL — CL (ref 12.0–15.0)
MCH: 27.3 pg (ref 26.0–34.0)
MCHC: 32.8 g/dL (ref 30.0–36.0)
MCV: 83.1 fL (ref 80.0–100.0)
Platelets: UNDETERMINED 10*3/uL (ref 150–400)
RBC: 2.42 MIL/uL — ABNORMAL LOW (ref 3.87–5.11)
RDW: 16.7 % — ABNORMAL HIGH (ref 11.5–15.5)
WBC: 13.1 10*3/uL — ABNORMAL HIGH (ref 4.0–10.5)
nRBC: 0 % (ref 0.0–0.2)

## 2020-04-03 LAB — CULTURE, BLOOD (ROUTINE X 2)
Culture: NO GROWTH
Culture: NO GROWTH
Special Requests: ADEQUATE

## 2020-04-03 LAB — FOLATE: Folate: 5.4 ng/mL — ABNORMAL LOW (ref >5.9)

## 2020-04-03 LAB — RENAL FUNCTION PANEL
Albumin: 1.3 g/dL — ABNORMAL LOW (ref 3.5–5.0)
Anion gap: 12 (ref 5–15)
BUN: 85 mg/dL — ABNORMAL HIGH (ref 8–23)
CO2: 13 mmol/L — ABNORMAL LOW (ref 22–32)
Calcium: 9 mg/dL (ref 8.9–10.3)
Chloride: 111 mmol/L (ref 98–111)
Creatinine, Ser: 3.09 mg/dL — ABNORMAL HIGH (ref 0.44–1.00)
GFR, Estimated: 16 mL/min — ABNORMAL LOW (ref >60)
Glucose, Bld: 106 mg/dL — ABNORMAL HIGH (ref 70–99)
Phosphorus: 4.2 mg/dL (ref 2.5–4.6)
Potassium: 4.2 mmol/L (ref 3.5–5.1)
Sodium: 136 mmol/L (ref 135–145)

## 2020-04-03 LAB — IRON AND TIBC
Iron: 29 ug/dL (ref 28–170)
Saturation Ratios: 21 % (ref 10.4–31.8)
TIBC: 140 ug/dL — ABNORMAL LOW (ref 250–450)
UIBC: 111 ug/dL

## 2020-04-03 LAB — GLUCOSE, CAPILLARY
Glucose-Capillary: 96 mg/dL (ref 70–99)
Glucose-Capillary: 106 mg/dL — ABNORMAL HIGH (ref 70–99)
Glucose-Capillary: 167 mg/dL — ABNORMAL HIGH (ref 70–99)
Glucose-Capillary: 149 mg/dL — ABNORMAL HIGH (ref 70–99)
Glucose-Capillary: 171 mg/dL — ABNORMAL HIGH (ref 70–99)

## 2020-04-03 LAB — PROTIME-INR
INR: 1.3 — ABNORMAL HIGH (ref 0.8–1.2)
Prothrombin Time: 16.1 seconds — ABNORMAL HIGH (ref 11.4–15.2)

## 2020-04-03 LAB — VITAMIN B12: Vitamin B-12: 1085 pg/mL — ABNORMAL HIGH (ref 180–914)

## 2020-04-03 LAB — HEMOGLOBIN AND HEMATOCRIT, BLOOD
HCT: 22.6 % — ABNORMAL LOW (ref 36.0–46.0)
Hemoglobin: 7.3 g/dL — ABNORMAL LOW (ref 12.0–15.0)

## 2020-04-03 LAB — RETICULOCYTES
Immature Retic Fract: 25.3 % — ABNORMAL HIGH (ref 2.3–15.9)
RBC.: 2.36 MIL/uL — ABNORMAL LOW (ref 3.87–5.11)
Retic Count, Absolute: 35.2 10*3/uL (ref 19.0–186.0)
Retic Ct Pct: 1.5 % (ref 0.4–3.1)

## 2020-04-03 LAB — MAGNESIUM: Magnesium: 1.8 mg/dL (ref 1.7–2.4)

## 2020-04-03 LAB — LACTATE DEHYDROGENASE: LDH: 267 U/L — ABNORMAL HIGH (ref 98–192)

## 2020-04-03 LAB — FERRITIN: Ferritin: 1374 ng/mL — ABNORMAL HIGH (ref 11–307)

## 2020-04-03 LAB — PREPARE RBC (CROSSMATCH)

## 2020-04-03 MED ORDER — SODIUM CHLORIDE 0.9 % IV SOLN: INTRAVENOUS | Status: DC

## 2020-04-03 MED ORDER — SODIUM CHLORIDE 0.9% IV SOLUTION: Freq: Once | INTRAVENOUS | Status: AC

## 2020-04-03 NOTE — Progress Notes (Signed)
Occupational Therapy Treatment Patient Details Name: Deborah Figueroa MRN: [id number] DOB: [birth year] Today's Date: 04/03/2020    History of present illness 65 year old woman presenting with diabetic ketoacidosis and acute renal failure found to have MSSA bacteremia of unclear etiology. PMH of HTN, HLD, and IDDM who presents the ED via EMS for weakness and hyperglycemia.  Pt s/p I and D left elbow infection and osteomyelitis 04/01/2020. Elbow stitches removed 1/20 and patient now with fistula.   OT comments  Patient presents incontinent and needing to be cleaned. Patient now with fistula and fecal matter constantly oozing from vaginal. Patient total care for pericare/toileting supine in bed. Treatment focused on patient assisting more with bed mobility and functional using arms and legs to assist with bed mobility. Therapist educated patient on how to place extremities to assist with rolling - she needs min assist to bend knees and place foot on bed.  Patient able to roll left and right with min assist at end of treatment. Patient using both arms on bed rails to pull herself over and maintain position. Therapist had patient perform shoulder flexion reps - more difficulty on the left due to lack of control of left tricep, bicep curls, fist pumps to work on overall upper extremity ROM/strength and reduction of UE edema. Patient pleasant and agreeable to activity. Patient provided with squeez ball for edematous right hand. Continue POC.   Follow Up Recommendations  SNF;Supervision/Assistance - 24 hour    Equipment Recommendations  Other (comment) (TBD)    Recommendations for Other Services      Precautions / Restrictions Precautions Precaution Comments: left elbow active use to tolerance per ortho notes, may not obtain tricep activation (L) per ortho note. Restrictions Weight Bearing Restrictions: No       Mobility Bed Mobility                  Transfers                       Balance                                           ADL either performed or assessed with clinical judgement   ADL                               Toileting- Clothing Manipulation and Hygiene: Total assistance;Bed level;+2 for physical assistance;+2 for safety/equipment               Vision Patient Visual Report: No change from baseline     Perception     Praxis      Cognition Arousal/Alertness: Awake/alert Behavior During Therapy: WFL for tasks assessed/performed Overall Cognitive Status: Within Functional Limits for tasks assessed                                 General Comments: Waxing and waning cognition. is participatory now and able to follow commands and mostly appropriate. But still states "they treated me bad at that wooden building"        Exercises Other Exercises Other Exercises: Provided patient with squeeze ball to assist with edema reduction in right hand Other Exercises: Had patient performing active shoulder flexion and bicep curls while supine  Shoulder Instructions       General Comments      Pertinent Vitals/ Pain       Faces Pain Scale: Hurts whole lot Pain Location: periarea Pain Descriptors / Indicators: Grimacing Pain Intervention(s): Monitored during session  Home Living                                          Prior Functioning/Environment              Frequency  Min 2X/week        Progress Toward Goals  OT Goals(current goals can now be found in the care plan section)  Progress towards OT goals: Progressing toward goals  Acute Rehab OT Goals Patient Stated Goal: To wash her hair OT Goal Formulation: With patient Time For Goal Achievement: 04/05/20 Potential to Achieve Goals: Good  Plan Discharge plan remains appropriate    Co-evaluation                 AM-PAC OT "6 Clicks" Daily Activity     Outcome Measure   Help from another person  eating meals?: A Little Help from another person taking care of personal grooming?: A Little Help from another person toileting, which includes using toliet, bedpan, or urinal?: Total Help from another person bathing (including washing, rinsing, drying)?: A Lot Help from another person to put on and taking off regular upper body clothing?: A Lot Help from another person to put on and taking off regular lower body clothing?: Total 6 Click Score: 12    End of Session    OT Visit Diagnosis: Other abnormalities of gait and mobility (R26.89);Muscle weakness (generalized) (M62.81);Other symptoms and signs involving cognitive function;Pain   Activity Tolerance Patient tolerated treatment well   Patient Left in bed;with call bell/phone within reach;with nursing/sitter in room   Nurse Communication Mobility status        Time: 1601-0932 OT Time Calculation (min): 32 min  Charges: OT General Charges $OT Visit: 1 Visit OT Treatments $Self Care/Home Management : 8-22 mins $Therapeutic Activity: 8-22 mins  Derl Barrow, OTR/L [place]  Office [phone] Pager: [place] 04/03/2020, 4:23 PM

## 2020-04-03 NOTE — Progress Notes (Signed)
PROGRESS NOTE  Deborah Figueroa:[id number] DOB: 08-Nov-1955   PCP: Ann Held, DO  Patient is from: Homeless  DOA: 03/20/2020 LOS: 29  Chief complaints: Fall  Brief Narrative / Interim history: 65 year old female with PMH of uncontrolled DM-2, chronic hypercalcemia presumed to be due to primary hypothyroidism, bipolar disorder, depression, HTN, HLD and recurrent falls brought to ED by EMS after fall at her hotel room and admitted for DKA, hypercalcemia and sepsis in the setting of MSSA bacteremia, left olecranon bursitis and osteomyelitis.  She underwent I&D of the left elbow by orthopedic surgery on 1/10.  TEE negative for endocarditis.  Infectious disease recommended IV antibiotics for 6 weeks.  Therapy recommended SNF but patient has no insurance.  Cannot be discharged home with IV antibiotics as she is homeless.  Hospital course complicated acute respiratory failure due to hospital-acquired pneumonia, blood loss anemia due to recurrent epistaxis in the setting of coagulopathy, and AKI.  Subjective: Seen and examined earlier this morning.  No major events overnight or this morning.  Continues to complain dry mouth and throat.  She has poor insight into why she is in the hospital.  Not a great historian.  Objective: Vitals:   04/03/20 1057 04/03/20 1127 04/03/20 1128 04/03/20 1155  BP: 114/63 118/68  120/70  Pulse: 87 76  81  Resp: 20 17  16   Temp: 97.7 F (36.5 C) (!) 97.4 F (36.3 C)  97.8 F (36.6 C)  TempSrc: Oral Oral Axillary Oral  SpO2: 98% 98%  98%  Weight:      Height:        Intake/Output Summary (Last 24 hours) at 04/03/2020 1254 Last data filed at 04/03/2020 1128 Gross per 24 hour  Intake 2223.44 ml  Output --  Net 2223.44 ml   Filed Weights   03/22/2020 0952  Weight: 93.9 kg    Examination:  GENERAL: No apparent distress.  Nontoxic. HEENT: MMM.  No apparent lesion.  Vision and hearing grossly intact.  NECK: Supple.  No apparent JVD.  RESP:  On RA.  No IWOB.  Fair aeration bilaterally. CVS:  RRR. Heart sounds normal.  ABD/GI/GU: BS+. Abd soft, NTND.  MSK/EXT:  Moves extremities. No apparent deformity.  RUE swelling/edema. SKIN: no apparent skin lesion or wound NEURO: Awake, alert and oriented fairly.  No apparent focal neuro deficit. PSYCH: Calm.  Poor insight.  Procedures:  I&D of the elbow by orthopedic surgery 1/10.  Called  Microbiology summarized: 1/9-blood cultures pansensitive staph auris. 1/11-blood cultures NGTD.  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 -S/p I&D of left elbow paraplegic surgery 1/10 -Surgical culture grew pansensitive staph auris. -Repeat blood culture 1/11 no growth to date -TEE negative for endocarditis -ID, Dr. Gale Journey. recommended 6 weeks of IV cefazolin but unable to set up home health as patient is homeless and has no insurance.  An alternative is 4 weeks of IV antibiotics and two weeks of oral antibiotics (linezolid). -Antibiotics changed to cefepime  due to hospital-acquired pneumonia  Acute metabolic encephalopathy: some concern about paranoid thoughts. Poor insight.  Probably in the setting of the above. MRI brain without acute finding.  No focal neurodeficits. -Treat treatable causes. -Reorientation and delirium precaution  Acute hypoxemic respiratory failure due to HAP: Resolved.  Desaturated to 87% on RA. CT chest on 1/17 obtained which revealed right apical lung mass concerning  for neoplasm, potentially with chest wall invasion as well as bilateral lower lobe infiltrates, no evidence of cavitary pneumonia/lung abscess, ill-defined peripancreatic tissue, left thyroid nodule -Antibiotics escalated to cefepime to cover hospital-acquired pneumonia  Right apical lung mass -Consult IR for possible CT-guided biopsy.   Left  thyroid nodule -Outpatient thyroid US and biopsy   Ill-defined peripancreatic tissue -Lipase 60, no nausea, vomiting, epigastric abdominal pain  AKI on CKD stage IIIb/azotemia: Baseline Cr 1.8>>> 2.77>3.08> 3.09.  BUN 38>> 74>> 80> 85.  Seems prerenal in the setting of poor p.o. intake.  Was also on lisinopril/HCTZ.  Renal US without significant finding. -Continue gentle IV fluid.  Rising creatinine seems to be slowing. -Avoid nephrotoxic meds -Repeat BMP in the morning  Hypernatremia: Resolved. -Resolved   Hypokalemia/hypomagnesemia: Hypokalemia resolved. -Replace magnesium  Metabolic acidosis: Likely due to renal failure. -Continue sodium bicarbonate -Continue monitoring  Severe hypercalcemia secondary to primary hyperparathyroidism: Hypercalcemia seems to have resolved. -Followed by endocrinology outpatient, referred for surgery -Calcium >15 on admission, status post bisphosphonate, Calcitonin, IVF  Weakness with recurrent falls: Multifactorial including hypercalcemia, sepsis, uncontrolled diabetes, anemia and possibly polypharmacy. MRI brain negative for any acute CNS involvement -PT eval recommending SNF placement  Type 2 diabetes mellitus with DKA: A1c 10.4%.  DKA resolved. Recent Labs  Lab 04/02/20 2023 04/02/20 2334 04/03/20 0411 04/03/20 0748 04/03/20 1153  GLUCAP 95 96 96 106* 167*  -Continue Lantus, sliding scale insulin  Acute on chronic normocytic anemia: Hgb 9.5 in 2020>> 7.7 (admit)>> 6.7>3u>>>>6.6>1u>. Drop likely due to epistaxis and dilutional effect from IV fluid. Fecal Hemoccult negative.  She also has some coagulopathy that has improved with vitamin K. -Transfuse 1 unit -Check LDH, haptoglobin and anemia panel  Recurrent epistaxis-in the setting of coagulopathy.  Seems to have subsided.  Encouraged to stop picking at her nose. -Monitor coagulopathy labs-improved.  Hypertension: Normotensive -Continue to hold lisinopril/HCTZ.  Continue  metoprolol  Hyperlipidemia -Continue Lipitor  Depression: Stable -Continue Zoloft  Sore throat/odynophagia -Chloraseptic spray and Magic mouthwash with lidocaine  Superficial DVT involving the right cephalic vein -No indication for treatment, especially in the setting of coagulopathy.  Rectovaginal fistula -Needs outpatient follow-up with gynecology  Body mass index is 28.87 kg/m.         DVT prophylaxis:  SCDs Start: 03/24/2020 1608  Code Status: Full code Family Communication: Patient and/or RN.  Attempted to call patient's son, [name] but no answer.  Did not leave voicemail on generic voicemail prompt.  Status is: Inpatient  Remains inpatient appropriate because:Altered mental status, Unsafe d/c plan, IV treatments appropriate due to intensity of illness or inability to take PO and Inpatient level of care appropriate due to severity of illness   Dispo: The patient is from: Homeless               Anticipated d/c is to: SNF              Anticipated d/c date is: > 3 days              Patient currently is not medically stable to d/c.       Consultants:  Infectious disease Cardiology Orthopedic surgery   Sch Meds:  Scheduled Meds:  atorvastatin  40 mg Oral Daily   docusate sodium  100 mg Oral BID   fenofibrate  160 mg Oral Daily   fluconazole  150 mg Oral Once   insulin aspart  0-15 Units Subcutaneous Q4H   insulin aspart  5 Units Subcutaneous TID WC  insulin glargine  18 Units Subcutaneous QHS   melatonin  3 mg Oral QHS   metoprolol succinate  100 mg Oral Daily   nystatin   Topical TID   sertraline  100 mg Oral Daily   sodium bicarbonate  650 mg Oral TID   Continuous Infusions:  sodium chloride 75 mL/hr at 04/03/20 0926   ceFEPime (MAXIPIME) IV 2 g (04/03/20 0646)   PRN Meds:.dextrose, liver oil-zinc oxide, magic mouthwash w/lidocaine, metoCLOPramide **OR** metoCLOPramide (REGLAN) injection, ondansetron **OR** ondansetron (ZOFRAN)  IV, oxymetazoline, phenol  Antimicrobials: Anti-infectives (From admission, onward)   Start     Dose/Rate Route Frequency Ordered Stop   04/02/20 0522  ceFEPIme (MAXIPIME) 2 g in sodium chloride 0.9 % 100 mL IVPB        2 g 200 mL/hr over 30 Minutes Intravenous Every 24 hours 04/01/20 0932     03/31/20 1800  ceFEPIme (MAXIPIME) 2 g in sodium chloride 0.9 % 100 mL IVPB  Status:  Discontinued        2 g 200 mL/hr over 30 Minutes Intravenous Every 12 hours 03/31/20 1244 04/01/20 0932   03/30/20 1030  piperacillin-tazobactam (ZOSYN) IVPB 3.375 g  Status:  Discontinued        3.375 g 12.5 mL/hr over 240 Minutes Intravenous Every 8 hours 03/30/20 1006 03/31/20 1236   03/29/20 1300  fluconazole (DIFLUCAN) tablet 150 mg        150 mg Oral  Once 03/29/20 1201     04/12/2020 1145  ceFAZolin (ANCEF) IVPB 2g/100 mL premix  Status:  Discontinued        2 g 200 mL/hr over 30 Minutes Intravenous Every 8 hours 04/06/2020 1055 03/30/20 0951   03/26/2020 1100  nafcillin 12 g in sodium chloride 0.9 % 500 mL continuous infusion  Status:  Discontinued        12 g 20.8 mL/hr over 24 Hours Intravenous Every 24 hours 03/18/2020 0949 03/14/2020 1055   03/22/20 1500  nafcillin 2 g in sodium chloride 0.9 % 100 mL IVPB  Status:  Discontinued        2 g 200 mL/hr over 30 Minutes Intravenous Every 4 hours 03/22/20 1354 03/18/2020 0949   03/22/20 1345  nafcillin injection 2 g  Status:  Discontinued        2 g Intravenous Every 4 hours 03/22/20 1344 03/22/20 1354   03/22/20 1300  ceFAZolin (ANCEF) IVPB 2g/100 mL premix  Status:  Discontinued        2 g 200 mL/hr over 30 Minutes Intravenous Every 8 hours 03/22/20 1257 03/22/20 1344       I have personally reviewed the following labs and images: CBC: Recent Labs  Lab 03/29/20 2303 03/30/20 0507 03/31/20 0710 03/31/20 2045 04/01/20 0526 04/02/20 0720 04/02/20 1659 04/03/20 0525  WBC 20.2* 20.5* 16.6*  --  17.5* 16.2*  --  13.1*  NEUTROABS 19.2*  --   --   --   --    --   --   --   HGB 7.8* 8.2* 6.0* 7.4* 7.1* 6.4* 7.1* 6.6*  HCT 24.1* 24.4* 19.5* 22.9* 22.8* 21.5* 22.5* 20.1*  MCV 84.3 84.1 87.8  --  87.0 89.6  --  83.1  PLT 350 322 287  --  325 321  --  PLATELET CLUMPS NOTED ON SMEAR, UNABLE TO ESTIMATE   BMP &GFR Recent Labs  Lab 03/30/20 0507 03/31/20 0710 04/01/20 0526 04/02/20 0720 04/03/20 0525  NA 149* 143 144 139 136  K  3.3* 3.2* 3.6 3.8 4.2  CL 115* 114* 115* 113* 111  CO2 19* 18* 17* 14* 13*  GLUCOSE 172* 188* 116* 97 106*  BUN 49* 60* 74* 80* 85*  CREATININE 1.73* 2.09* 2.77* 3.08* 3.09*  CALCIUM 9.6 9.1 9.4 9.4 9.0  MG  --  1.8 1.8 1.5* 1.8  PHOS  --   --   --  4.0 4.2   Estimated Creatinine Clearance: 23.2 mL/min (A) (by C-G formula based on SCr of 3.09 mg/dL (H)). Liver & Pancreas: Recent Labs  Lab 04/02/20 0720 04/03/20 0525  ALBUMIN 1.4* 1.3*   Recent Labs  Lab 03/30/20 1311  LIPASE 60*   No results for input(s): AMMONIA in the last 168 hours. Diabetic: No results for input(s): HGBA1C in the last 72 hours. Recent Labs  Lab 04/02/20 2023 04/02/20 2334 04/03/20 0411 04/03/20 0748 04/03/20 1153  GLUCAP 95 96 96 106* 167*   Cardiac Enzymes: No results for input(s): CKTOTAL, CKMB, CKMBINDEX, TROPONINI in the last 168 hours. No results for input(s): PROBNP in the last 8760 hours. Coagulation Profile: Recent Labs  Lab 04/01/20 2017 04/03/20 0525  INR 3.8* 1.3*   Thyroid Function Tests: No results for input(s): TSH, T4TOTAL, FREET4, T3FREE, THYROIDAB in the last 72 hours. Lipid Profile: No results for input(s): CHOL, HDL, LDLCALC, TRIG, CHOLHDL, LDLDIRECT in the last 72 hours. Anemia Panel: Recent Labs    04/03/20 0811  VITAMINB12 1,085*  FOLATE 5.4*  FERRITIN 1,374*  TIBC 140*  IRON 29  RETICCTPCT 1.5   Urine analysis:    Component Value Date/Time   COLORURINE YELLOW 03/30/2020 1330   APPEARANCEUR CLOUDY (A) 03/30/2020 1330   LABSPEC 1.020 03/30/2020 1330   PHURINE 5.0 03/30/2020 1330    GLUCOSEU 150 (A) 03/30/2020 1330   HGBUR SMALL (A) 03/30/2020 1330   BILIRUBINUR NEGATIVE 03/30/2020 1330   BILIRUBINUR Neg 11/21/2014 1158   KETONESUR 5 (A) 03/30/2020 1330   PROTEINUR 100 (A) 03/30/2020 1330   UROBILINOGEN 0.2 01/04/2015 1856   NITRITE NEGATIVE 03/30/2020 1330   LEUKOCYTESUR LARGE (A) 03/30/2020 1330   Sepsis Labs: Invalid input(s): PROCALCITONIN, [place]  Microbiology: Recent Results (from the past 240 hour(s))  Culture, blood (routine x 2)     Status: None   Collection Time: 03/29/20 10:51 AM   Specimen: BLOOD  Result Value Ref Range Status   Specimen Description   Final    BLOOD BLOOD RIGHT HAND Performed at [facility] [address] [place], [place] [address]    Special Requests   Final    BOTTLES DRAWN AEROBIC AND ANAEROBIC Blood Culture adequate volume Performed at [facility] [address] [place], [place] [address]    Culture   Final    NO GROWTH 5 DAYS Performed at [facility], [place] [address], [place], [place] [address]    Report Status 04/03/2020 FINAL  Final  Culture, blood (routine x 2)     Status: None   Collection Time: 03/29/20 10:52 AM   Specimen: BLOOD  Result Value Ref Range Status   Specimen Description   Final    BLOOD BLOOD RIGHT HAND Performed at [facility] [address], [place], [place] [address]    Special Requests   Final    BOTTLES DRAWN AEROBIC ONLY Blood Culture results may not be optimal due to an inadequate volume of blood received in culture bottles Performed at [facility] [address] [place], [place] [address]  Culture   Final    NO GROWTH 5 DAYS Performed at [facility], [place] [address], [place], [place] [address]    Report Status 04/03/2020 FINAL  Final  Culture, Urine     Status: Abnormal   Collection Time: 03/30/20  1:30 PM   Specimen: Urine, Clean Catch  Result Value Ref Range Status   Specimen  Description   Final    URINE, CLEAN CATCH Performed at [facility], [place] [address], [place], [place] [address]    Special Requests   Final    NONE Performed at [facility], [place] [address] [place], [place] [address]    Culture >=100,000 COLONIES/mL YEAST (A)  Final   Report Status 04/01/2020 FINAL  Final    Radiology Studies: No results found.   Nicholous Girgenti T. [place]  If 7PM-7AM, please contact night-coverage [email or web address] 04/03/2020, 12:54 PM

## 2020-04-03 NOTE — Progress Notes (Signed)
Pt complains of mouth pain/soreness. Magic mouthwash provided and given to pt. Pt states she just doesn't feel like taking it right now. (mouthwas h has lidocaine and will help with pain she is having). Will continue to monitor. Pt doesn't feel like taking night pills and states "I just want to lay here".

## 2020-04-03 NOTE — TOC Progression Note (Addendum)
Transition of Care [facility]) - Progression Note    Patient Details  Name: Deborah Figueroa MRN: [id number] Date of Birth: 1955-08-01  Transition of [facility]) CM/SW Contact  Ross Ludwig, [place] Phone Number: 04/03/2020, 5:13 PM  Clinical Narrative:     Patient still does not have any bed offers.  CSW notified leadership, that patient may be considered a difficult to place patient due to not having insurance, being homeless, and needing IV antibiotics for several more weeks per physician.   Expected Discharge Plan: Skilled Nursing Facility Barriers to Discharge: Continued Medical Work up  Expected Discharge Plan and Services Expected Discharge Plan: [place]   Discharge Planning Services: CM Consult                                           Social Determinants of Health (SDOH) Interventions    Readmission Risk Interventions No flowsheet data found.

## 2020-04-03 NOTE — Progress Notes (Signed)
Pharmacy Antibiotic Note  Deborah Figueroa is a 65 y.o. female admitted on 04/01/2020 with MSSA bacteremia/ left elbow olecronon bursitis. Patient now noted to have an elevated white count with worsening left lower lobe pneumonia.  Pharmacy has been consulted for cefepime dosing for HAP. WBC today up slightly to 17.5. SCr jumped from 2.09 to 2.77.   04/03/20 10:02 AM  - SCr elevated, appears to be at plateau - WBC 13.1 - Tm24h: 99.1 - 1/15 BCx: ngtd, 1/17 UCx: yeast  Plan: Continue cefepime 2 gm every 24 hours  Monitor clinical s/sx of infection , renal function Day 3/7 of escalation to cefepime? Re-narrow when able- Plan MSSA treatment through ~ 05/05/20  Height: 5\' 11"  (180.3 cm) Weight: 93.9 kg (207 lb) (taken from previous charting) IBW/kg (Calculated) : 70.8  Temp (24hrs), Avg:97.7 F (36.5 C), Min:97.3 F (36.3 C), Max:98.6 F (37 C)  Recent Labs  Lab 03/29/20 1051 03/29/20 1257 03/29/20 2303 03/30/20 0507 03/31/20 0710 04/01/20 0526 04/02/20 0720 04/03/20 0525  WBC  --   --    < > 20.5* 16.6* 17.5* 16.2* 13.1*  CREATININE  --   --   --  1.73* 2.09* 2.77* 3.08* 3.09*  LATICACIDVEN 2.3* 2.2*  --   --   --   --   --   --    < > = values in this interval not displayed.    Estimated Creatinine Clearance: 23.2 mL/min (A) (by C-G formula based on SCr of 3.09 mg/dL (H)).    No Known Allergies   Thank you for allowing pharmacy to be a part of this patient's care.  Ulice Dash D  04/03/2020 10:02 AM

## 2020-04-03 NOTE — Progress Notes (Signed)
CRITICAL VALUE ALERT  Critical Value:  Hgb; 6.6  Date & Time Notied:  04/03/20 7:05  Provider Notified: Cyndia Skeeters   Orders Received/Actions taken:  Jerene Pitch

## 2020-04-04 DIAGNOSIS — R7989 Other specified abnormal findings of blood chemistry: Secondary | ICD-10-CM

## 2020-04-04 LAB — TYPE AND SCREEN
ABO/RH(D): A POS
Antibody Screen: NEGATIVE
Unit division: 0
Unit division: 0
Unit division: 0

## 2020-04-04 LAB — CBC
HCT: 22.6 % — ABNORMAL LOW (ref 36.0–46.0)
Hemoglobin: 7.2 g/dL — ABNORMAL LOW (ref 12.0–15.0)
MCH: 27.3 pg (ref 26.0–34.0)
MCHC: 31.9 g/dL (ref 30.0–36.0)
MCV: 85.6 fL (ref 80.0–100.0)
Platelets: 254 10*3/uL (ref 150–400)
RBC: 2.64 MIL/uL — ABNORMAL LOW (ref 3.87–5.11)
RDW: 16.8 % — ABNORMAL HIGH (ref 11.5–15.5)
WBC: 12.1 10*3/uL — ABNORMAL HIGH (ref 4.0–10.5)
nRBC: 0 % (ref 0.0–0.2)

## 2020-04-04 LAB — BPAM RBC
Blood Product Expiration Date: 202202022359
Blood Product Expiration Date: 202202162359
Blood Product Expiration Date: 202202162359
ISSUE DATE / TIME: 202201181447
ISSUE DATE / TIME: 202201201321
ISSUE DATE / TIME: 202201211120
Unit Type and Rh: 6200
Unit Type and Rh: 6200
Unit Type and Rh: 6200

## 2020-04-04 LAB — RENAL FUNCTION PANEL
Albumin: 1.3 g/dL — ABNORMAL LOW (ref 3.5–5.0)
Anion gap: 9 (ref 5–15)
BUN: 80 mg/dL — ABNORMAL HIGH (ref 8–23)
CO2: 15 mmol/L — ABNORMAL LOW (ref 22–32)
Calcium: 8.9 mg/dL (ref 8.9–10.3)
Chloride: 112 mmol/L — ABNORMAL HIGH (ref 98–111)
Creatinine, Ser: 2.92 mg/dL — ABNORMAL HIGH (ref 0.44–1.00)
GFR, Estimated: 17 mL/min — ABNORMAL LOW (ref >60)
Glucose, Bld: 145 mg/dL — ABNORMAL HIGH (ref 70–99)
Phosphorus: 3.8 mg/dL (ref 2.5–4.6)
Potassium: 3.7 mmol/L (ref 3.5–5.1)
Sodium: 136 mmol/L (ref 135–145)

## 2020-04-04 LAB — GLUCOSE, CAPILLARY
Glucose-Capillary: 128 mg/dL — ABNORMAL HIGH (ref 70–99)
Glucose-Capillary: 141 mg/dL — ABNORMAL HIGH (ref 70–99)
Glucose-Capillary: 72 mg/dL (ref 70–99)
Glucose-Capillary: 79 mg/dL (ref 70–99)
Glucose-Capillary: 86 mg/dL (ref 70–99)
Glucose-Capillary: 99 mg/dL (ref 70–99)

## 2020-04-04 LAB — PROTIME-INR
INR: 1.3 — ABNORMAL HIGH (ref 0.8–1.2)
Prothrombin Time: 15.8 seconds — ABNORMAL HIGH (ref 11.4–15.2)

## 2020-04-04 LAB — APTT: aPTT: 69 seconds — ABNORMAL HIGH (ref 24–36)

## 2020-04-04 LAB — MAGNESIUM: Magnesium: 1.8 mg/dL (ref 1.7–2.4)

## 2020-04-04 NOTE — Progress Notes (Signed)
PROGRESS NOTE  Deborah Figueroa TKW:[id number] DOB: 25-Jul-1955   PCP: Ann Held, DO  Patient is from: Homeless  DOA: 03/19/2020 LOS: 55  Chief complaints: Fall  Brief Narrative / Interim history: 65 year old female with PMH of uncontrolled DM-2, chronic hypercalcemia presumed to be due to primary hypothyroidism, bipolar disorder, depression, HTN, HLD and recurrent falls brought to ED by EMS after fall at her hotel room and admitted for DKA, hypercalcemia and sepsis in the setting of MSSA bacteremia, left olecranon bursitis and osteomyelitis.  She underwent I&D of the left elbow by orthopedic surgery on 1/10.  TEE negative for endocarditis.  Infectious disease recommended IV antibiotics for 6 weeks.  Therapy recommended SNF but patient has no insurance.  Cannot be discharged home with IV antibiotics as she is homeless.  Hospital course complicated acute respiratory failure due to hospital-acquired pneumonia, blood loss anemia due to recurrent epistaxis in the setting of coagulopathy, and AKI.  Subjective: Seen and examined earlier this morning.  No major events overnight of this morning.  No complaints other than some soreness in her mouth but not a great historian.  Objective: Vitals:   04/03/20 1439 04/03/20 2044 04/04/20 0519 04/04/20 0829  BP: 137/80 114/66 135/80 (!) 144/88  Pulse: 84 87 90 90  Resp:  16 17 16   Temp: 98 F (36.7 C) 97.6 F (36.4 C) 98.5 F (36.9 C) 98.1 F (36.7 C)  TempSrc: Oral Oral Axillary   SpO2: 97% 96% 95% 98%  Weight:      Height:        Intake/Output Summary (Last 24 hours) at 04/04/2020 1407 Last data filed at 04/04/2020 0848 Gross per 24 hour  Intake 2663.48 ml  Output -  Net 2663.48 ml   Filed Weights   03/19/2020 0952  Weight: 93.9 kg    Examination:  GENERAL: No apparent distress.  Nontoxic. HEENT: MMM.  Vision and hearing grossly intact.  NECK: Supple.  No apparent JVD.  RESP: On RA.  No IWOB.  Fair aeration  bilaterally. CVS:  RRR. Heart sounds normal.  ABD/GI/GU: BS+. Abd soft, NTND.  MSK/EXT:  Moves extremities. No apparent deformity. No edema.  SKIN: no apparent skin lesion or wound NEURO: Awake, alert and oriented x4 but no great insight.  No apparent focal neuro deficit. PSYCH: Calm. Normal affect.  Procedures:  I&D of the elbow by orthopedic surgery 1/10.  Called  Microbiology summarized: 1/9-blood cultures pansensitive staph auris. 1/11-blood cultures NGTD.  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 -S/p I&D of left elbow paraplegic surgery 1/10 -Surgical culture grew pansensitive staph auris. -Repeat blood culture 1/11 no growth to date -TEE negative for endocarditis -ID, Dr. Gale Journey. recommended 6 weeks of IV cefazolin but unable to set up home health as patient is homeless and has no insurance.  An alternative is 4 weeks of IV antibiotics and two weeks of oral antibiotics (linezolid). -Antibiotics changed to cefepime  due to hospital-acquired pneumonia  Acute metabolic encephalopathy: Alert and oriented x4 but no great insight.  Probably in the setting of the above. MRI brain without acute finding.  No focal neurodeficits. -Treat treatable causes. -Reorientation and delirium precaution  Acute hypoxemic respiratory failure due to HAP: Resolved.  Desaturated to 87% on RA. CT chest on 1/17 obtained which revealed right apical lung mass concerning for neoplasm, potentially with chest wall invasion  as well as bilateral lower lobe infiltrates, no evidence of cavitary pneumonia/lung abscess, ill-defined peripancreatic tissue, left thyroid nodule -Antibiotics escalated to cefepime to cover hospital-acquired pneumonia  Right apical lung mass -We will consult IR for possible CT-guided biopsy next week.   Left thyroid  nodule -Outpatient thyroid US and biopsy   Ill-defined peripancreatic tissue -Lipase 60, no nausea, vomiting, epigastric abdominal pain  AKI on CKD stage IIIb/azotemia:  Recent Labs    03/26/20 0611 03/27/20 0605 03/28/20 0705 03/29/20 0518 03/30/20 0507 03/31/20 0710 04/01/20 0526 04/02/20 0720 04/03/20 0525 04/04/20 0102  BUN 35* 40* 42* 47* 49* 60* 74* 80* 85* 80*  CREATININE 1.68* 1.69* 1.76* 1.81* 1.73* 2.09* 2.77* 3.08* 3.09* 2.92*  Seems prerenal in the setting of poor p.o. intake.  Was also on lisinopril/HCTZ.  Renal US negative. -Continue gentle IV fluid.  -Avoid nephrotoxic meds -Repeat BMP in the morning  Hypernatremia: Resolved. -Resolved   Hypokalemia/hypomagnesemia: Resolved. -Monitor and replenish as appropriate  Metabolic acidosis: Likely due to renal failure. -Continue sodium bicarbonate -Continue monitoring  Severe hypercalcemia due to primary hyperparathyroidism: Calcium >15 on admission. S/p  bisphosphonate, Calcitonin and IVF.  Resolved. -Followed by endocrinology outpatient, referred for surgery  Weakness with recurrent falls: Multifactorial including hypercalcemia, sepsis, uncontrolled diabetes, anemia and possibly polypharmacy. MRI brain negative for any acute CNS involvement -PT eval recommending SNF placement  Type 2 diabetes mellitus with DKA: A1c 10.4%.  DKA resolved. Recent Labs  Lab 04/03/20 2040 04/04/20 0029 04/04/20 0334 04/04/20 0739 04/04/20 1202  GLUCAP 171* 141* 128* 99 72  -Continue Lantus, sliding scale insulin  Acute on chronic normocytic anemia: Hgb 9.5 in 2020>> 7.7 (admit)>> 6.7>4u>>> 7.2. Fecal Hemoccult negative. Drop likely due to epistaxis, coagulopathy and dilutional effect from IV fluid.  LDH slightly elevated. -Follow haptoglobin -Replenish folic acid-slightly low.  Recurrent epistaxis-in the setting of coagulopathy.  Seems to have subsided.  Encouraged to stop picking at her nose.  Coagulopathy  improved. -Monitor coagulopathy labs  Hypertension: Normotensive -Continue to hold lisinopril/HCTZ.  Continue metoprolol  Hyperlipidemia -Continue Lipitor  Depression: Stable -Continue Zoloft  Sore throat/odynophagia -Chloraseptic spray and Magic mouthwash with lidocaine  Superficial DVT involving the right cephalic vein -No indication for treatment, especially in the setting of coagulopathy.  Rectovaginal fistula -Needs outpatient follow-up with gynecology  Body mass index is 28.87 kg/m.         DVT prophylaxis:  SCDs Start: 03/25/2020 1608  Code Status: Full code Family Communication: Patient and/or RN.  Attempted to call patient's son, [name] on 1/21 but no answer.  Did not leave voicemail on generic voicemail prompt.  Status is: Inpatient  Remains inpatient appropriate because:Unsafe d/c plan, IV treatments appropriate due to intensity of illness or inability to take PO and Inpatient level of care appropriate due to severity of illness   Dispo: The patient is from: Homeless               Anticipated d/c is to: SNF              Anticipated d/c date is: > 3 days              Patient currently is medically stable to d/c.       Consultants:  Infectious disease Cardiology Orthopedic surgery   Sch Meds:  Scheduled Meds: . atorvastatin  40 mg Oral Daily  . docusate sodium  100 mg Oral BID  . fenofibrate  160 mg Oral Daily  . fluconazole  150 mg Oral  Once  . insulin aspart  0-15 Units Subcutaneous Q4H  . insulin aspart  5 Units Subcutaneous TID WC  . insulin glargine  18 Units Subcutaneous QHS  . melatonin  3 mg Oral QHS  . metoprolol succinate  100 mg Oral Daily  . nystatin   Topical TID  . sertraline  100 mg Oral Daily  . sodium bicarbonate  650 mg Oral TID   Continuous Infusions: . sodium chloride 75 mL/hr at 04/04/20 0112  . ceFEPime (MAXIPIME) IV 2 g (04/04/20 0415)   PRN Meds:.dextrose, liver oil-zinc oxide, magic mouthwash w/lidocaine,  metoCLOPramide **OR** metoCLOPramide (REGLAN) injection, ondansetron **OR** ondansetron (ZOFRAN) IV, oxymetazoline, phenol  Antimicrobials: Anti-infectives (From admission, onward)   Start     Dose/Rate Route Frequency Ordered Stop   04/02/20 0522  ceFEPIme (MAXIPIME) 2 g in sodium chloride 0.9 % 100 mL IVPB        2 g 200 mL/hr over 30 Minutes Intravenous Every 24 hours 04/01/20 0932     03/31/20 1800  ceFEPIme (MAXIPIME) 2 g in sodium chloride 0.9 % 100 mL IVPB  Status:  Discontinued        2 g 200 mL/hr over 30 Minutes Intravenous Every 12 hours 03/31/20 1244 04/01/20 0932   03/30/20 1030  piperacillin-tazobactam (ZOSYN) IVPB 3.375 g  Status:  Discontinued        3.375 g 12.5 mL/hr over 240 Minutes Intravenous Every 8 hours 03/30/20 1006 03/31/20 1236   03/29/20 1300  fluconazole (DIFLUCAN) tablet 150 mg        150 mg Oral  Once 03/29/20 1201     03/16/2020 1145  ceFAZolin (ANCEF) IVPB 2g/100 mL premix  Status:  Discontinued        2 g 200 mL/hr over 30 Minutes Intravenous Every 8 hours 04/07/2020 1055 03/30/20 0951   03/30/2020 1100  nafcillin 12 g in sodium chloride 0.9 % 500 mL continuous infusion  Status:  Discontinued        12 g 20.8 mL/hr over 24 Hours Intravenous Every 24 hours 04/03/2020 0949 04/08/2020 1055   03/22/20 1500  nafcillin 2 g in sodium chloride 0.9 % 100 mL IVPB  Status:  Discontinued        2 g 200 mL/hr over 30 Minutes Intravenous Every 4 hours 03/22/20 1354 04/06/2020 0949   03/22/20 1345  nafcillin injection 2 g  Status:  Discontinued        2 g Intravenous Every 4 hours 03/22/20 1344 03/22/20 1354   03/22/20 1300  ceFAZolin (ANCEF) IVPB 2g/100 mL premix  Status:  Discontinued        2 g 200 mL/hr over 30 Minutes Intravenous Every 8 hours 03/22/20 1257 03/22/20 1344       I have personally reviewed the following labs and images: CBC: Recent Labs  Lab 03/29/20 2303 03/30/20 0507 03/31/20 0710 03/31/20 2045 04/01/20 0526 04/02/20 0720 04/02/20 1659  04/03/20 0525 04/03/20 1652 04/04/20 0102  WBC 20.2*   < > 16.6*  --  17.5* 16.2*  --  13.1*  --  12.1*  NEUTROABS 19.2*  --   --   --   --   --   --   --   --   --   HGB 7.8*   < > 6.0*   < > 7.1* 6.4* 7.1* 6.6* 7.3* 7.2*  HCT 24.1*   < > 19.5*   < > 22.8* 21.5* 22.5* 20.1* 22.6* 22.6*  MCV 84.3   < >  87.8  --  87.0 89.6  --  83.1  --  85.6  PLT 350   < > 287  --  325 321  --  PLATELET CLUMPS NOTED ON SMEAR, UNABLE TO ESTIMATE  --  254   < > = values in this interval not displayed.   BMP &GFR Recent Labs  Lab 03/31/20 0710 04/01/20 0526 04/02/20 0720 04/03/20 0525 04/04/20 0102  NA 143 144 139 136 136  K 3.2* 3.6 3.8 4.2 3.7  CL 114* 115* 113* 111 112*  CO2 18* 17* 14* 13* 15*  GLUCOSE 188* 116* 97 106* 145*  BUN 60* 74* 80* 85* 80*  CREATININE 2.09* 2.77* 3.08* 3.09* 2.92*  CALCIUM 9.1 9.4 9.4 9.0 8.9  MG 1.8 1.8 1.5* 1.8 1.8  PHOS  --   --  4.0 4.2 3.8   Estimated Creatinine Clearance: 24.6 mL/min (A) (by C-G formula based on SCr of 2.92 mg/dL (H)). Liver & Pancreas: Recent Labs  Lab 04/02/20 0720 04/03/20 0525 04/04/20 0102  ALBUMIN 1.4* 1.3* 1.3*   Recent Labs  Lab 03/30/20 1311  LIPASE 60*   No results for input(s): AMMONIA in the last 168 hours. Diabetic: No results for input(s): HGBA1C in the last 72 hours. Recent Labs  Lab 04/03/20 2040 04/04/20 0029 04/04/20 0334 04/04/20 0739 04/04/20 1202  GLUCAP 171* 141* 128* 99 72   Cardiac Enzymes: No results for input(s): CKTOTAL, CKMB, CKMBINDEX, TROPONINI in the last 168 hours. No results for input(s): PROBNP in the last 8760 hours. Coagulation Profile: Recent Labs  Lab 04/01/20 2017 04/03/20 0525 04/04/20 0102  INR 3.8* 1.3* 1.3*   Thyroid Function Tests: No results for input(s): TSH, T4TOTAL, FREET4, T3FREE, THYROIDAB in the last 72 hours. Lipid Profile: No results for input(s): CHOL, HDL, LDLCALC, TRIG, CHOLHDL, LDLDIRECT in the last 72 hours. Anemia Panel: Recent Labs    04/03/20 0811   VITAMINB12 1,085*  FOLATE 5.4*  FERRITIN 1,374*  TIBC 140*  IRON 29  RETICCTPCT 1.5   Urine analysis:    Component Value Date/Time   COLORURINE YELLOW 03/30/2020 1330   APPEARANCEUR CLOUDY (A) 03/30/2020 1330   LABSPEC 1.020 03/30/2020 1330   PHURINE 5.0 03/30/2020 1330   GLUCOSEU 150 (A) 03/30/2020 1330   HGBUR SMALL (A) 03/30/2020 1330   BILIRUBINUR NEGATIVE 03/30/2020 1330   BILIRUBINUR Neg 11/21/2014 1158   KETONESUR 5 (A) 03/30/2020 1330   PROTEINUR 100 (A) 03/30/2020 1330   UROBILINOGEN 0.2 01/04/2015 1856   NITRITE NEGATIVE 03/30/2020 1330   LEUKOCYTESUR LARGE (A) 03/30/2020 1330   Sepsis Labs: Invalid input(s): PROCALCITONIN, [place]  Microbiology: Recent Results (from the past 240 hour(s))  Culture, blood (routine x 2)     Status: None   Collection Time: 03/29/20 10:51 AM   Specimen: BLOOD  Result Value Ref Range Status   Specimen Description   Final    BLOOD BLOOD RIGHT HAND Performed at [facility] [address], [place], [place] [address]    Special Requests   Final    BOTTLES DRAWN AEROBIC AND ANAEROBIC Blood Culture adequate volume Performed at [facility] [address] [place], [place] [address]    Culture   Final    NO GROWTH 5 DAYS Performed at [facility], [place] [address], [place], [place] [address]    Report Status 04/03/2020 FINAL  Final  Culture, blood (routine x 2)     Status: None   Collection Time: 03/29/20 10:52 AM  Specimen: BLOOD  Result Value Ref Range Status   Specimen Description   Final    BLOOD BLOOD RIGHT HAND Performed at [facility] [address] [place], [place] [address]    Special Requests   Final    BOTTLES DRAWN AEROBIC ONLY Blood Culture results may not be optimal due to an inadequate volume of blood received in culture bottles Performed at [facility] [address], [place], [place] [address]    Culture   Final     NO GROWTH 5 DAYS Performed at [facility], [place] [address] [place], [place] [address]    Report Status 04/03/2020 FINAL  Final  Culture, Urine     Status: Abnormal   Collection Time: 03/30/20  1:30 PM   Specimen: Urine, Clean Catch  Result Value Ref Range Status   Specimen Description   Final    URINE, CLEAN CATCH Performed at [facility], [place] [address] [place], [place] [address]    Special Requests   Final    NONE Performed at [facility], [place] [address], [place], [place] [address]    Culture >=100,000 COLONIES/mL YEAST (A)  Final   Report Status 04/01/2020 FINAL  Final    Radiology Studies: No results found.   Deborah Figueroa  If 7PM-7AM, please contact night-coverage [email or web address] 04/04/2020, 2:07 PM

## 2020-04-05 ENCOUNTER — Inpatient Hospital Stay (HOSPITAL_COMMUNITY): Payer: Medicaid Other

## 2020-04-05 LAB — APTT: aPTT: 65 seconds — ABNORMAL HIGH (ref 24–36)

## 2020-04-05 LAB — GLUCOSE, CAPILLARY
Glucose-Capillary: 114 mg/dL — ABNORMAL HIGH (ref 70–99)
Glucose-Capillary: 119 mg/dL — ABNORMAL HIGH (ref 70–99)
Glucose-Capillary: 123 mg/dL — ABNORMAL HIGH (ref 70–99)
Glucose-Capillary: 123 mg/dL — ABNORMAL HIGH (ref 70–99)
Glucose-Capillary: 130 mg/dL — ABNORMAL HIGH (ref 70–99)
Glucose-Capillary: 171 mg/dL — ABNORMAL HIGH (ref 70–99)

## 2020-04-05 LAB — RENAL FUNCTION PANEL
Albumin: 1.3 g/dL — ABNORMAL LOW (ref 3.5–5.0)
Anion gap: 9 (ref 5–15)
BUN: 70 mg/dL — ABNORMAL HIGH (ref 8–23)
CO2: 13 mmol/L — ABNORMAL LOW (ref 22–32)
Calcium: 8.8 mg/dL — ABNORMAL LOW (ref 8.9–10.3)
Chloride: 115 mmol/L — ABNORMAL HIGH (ref 98–111)
Creatinine, Ser: 2.59 mg/dL — ABNORMAL HIGH (ref 0.44–1.00)
GFR, Estimated: 20 mL/min — ABNORMAL LOW (ref >60)
Glucose, Bld: 138 mg/dL — ABNORMAL HIGH (ref 70–99)
Phosphorus: 4.1 mg/dL (ref 2.5–4.6)
Potassium: 3.6 mmol/L (ref 3.5–5.1)
Sodium: 137 mmol/L (ref 135–145)

## 2020-04-05 LAB — MAGNESIUM: Magnesium: 1.6 mg/dL — ABNORMAL LOW (ref 1.7–2.4)

## 2020-04-05 LAB — CBC
HCT: 21.8 % — ABNORMAL LOW (ref 36.0–46.0)
Hemoglobin: 6.8 g/dL — CL (ref 12.0–15.0)
MCH: 27.6 pg (ref 26.0–34.0)
MCHC: 31.2 g/dL (ref 30.0–36.0)
MCV: 88.6 fL (ref 80.0–100.0)
Platelets: 211 10*3/uL (ref 150–400)
RBC: 2.46 MIL/uL — ABNORMAL LOW (ref 3.87–5.11)
RDW: 17 % — ABNORMAL HIGH (ref 11.5–15.5)
WBC: 10.5 10*3/uL (ref 4.0–10.5)
nRBC: 0 % (ref 0.0–0.2)

## 2020-04-05 LAB — PROTIME-INR
INR: 1.4 — ABNORMAL HIGH (ref 0.8–1.2)
Prothrombin Time: 16.3 seconds — ABNORMAL HIGH (ref 11.4–15.2)

## 2020-04-05 LAB — HAPTOGLOBIN: Haptoglobin: 266 mg/dL (ref 37–355)

## 2020-04-05 MED ORDER — CEFAZOLIN SODIUM-DEXTROSE 2-4 GM/100ML-% IV SOLN
2.0000 g | Freq: Two times a day (BID) | INTRAVENOUS | Status: DC
  Administered 2020-04-05 – 2020-04-10 (×10): 2 g via INTRAVENOUS
  Filled 2020-04-05 (×10): qty 100

## 2020-04-05 MED ORDER — CEFAZOLIN SODIUM-DEXTROSE 2-4 GM/100ML-% IV SOLN
2.0000 g | Freq: Three times a day (TID) | INTRAVENOUS | Status: DC
  Filled 2020-04-05: qty 100

## 2020-04-05 MED ORDER — LIP MEDEX EX OINT
TOPICAL_OINTMENT | CUTANEOUS | Status: AC
  Filled 2020-04-05: qty 7

## 2020-04-05 MED ORDER — ALUM & MAG HYDROXIDE-SIMETH 200-200-20 MG/5ML PO SUSP
30.0000 mL | Freq: Four times a day (QID) | ORAL | Status: DC | PRN
  Administered 2020-04-06: 30 mL via ORAL
  Filled 2020-04-05: qty 30

## 2020-04-05 NOTE — Progress Notes (Signed)
Pharmacy Antibiotic Note  Deborah Figueroa is a 65 y.o. female admitted on 03/15/2020 with MSSA bacteremia/ left elbow olecronon bursitis. Patient was noted 1/18 to have an elevated white count with worsening left lower lobe pneumonia.  Pharmacy has been consulted for cefepime dosing for HAP. WBC was incr to 17.5   04/05/20 1:02 PM  - SCr elevated, improving, 2.59, Cl < 30 ml/min today  - WBC wnl - Afebrile - Completed 4 days escalation to Cefepime for cavitary PNA & elevated WBC, back to Ancef today  Plan: Ancef 2gm q12 Adjusted per renal function Monitor clinical s/sx of infection , renal function  Plan MSSA treatment per OPAT through ~ 05/05/20  Height: 5\' 11"  (180.3 cm) Weight: 93.9 kg (207 lb) (taken from previous charting) IBW/kg (Calculated) : 70.8  Temp (24hrs), Avg:97.7 F (36.5 C), Min:97.6 F (36.4 C), Max:97.8 F (36.6 C)  Recent Labs  Lab 04/01/20 0526 04/02/20 0720 04/03/20 0525 04/04/20 0102 04/05/20 0532  WBC 17.5* 16.2* 13.1* 12.1* 10.5  CREATININE 2.77* 3.08* 3.09* 2.92* 2.59*    Estimated Creatinine Clearance: 27.7 mL/min (A) (by C-G formula based on SCr of 2.59 mg/dL (H)).    No Known Allergies  Thank you for allowing pharmacy to be a part of this patient's care.  Minda Ditto PharmD 04/05/2020 1:02 PM

## 2020-04-05 NOTE — Progress Notes (Addendum)
PROGRESS NOTE  Deborah Figueroa PZW:[id number] DOB: 02-27-1956   PCP: Ann Held, DO  Patient is from: Homeless  DOA: 04/12/2020 LOS: 39  Chief complaints: Fall  Brief Narrative / Interim history: 65 year old female with PMH of uncontrolled DM-2, chronic hypercalcemia presumed to be due to primary hypothyroidism, bipolar disorder, depression, HTN, HLD and recurrent falls brought to ED by EMS after fall at her hotel room and admitted for DKA, hypercalcemia and sepsis in the setting of MSSA bacteremia, left olecranon bursitis and osteomyelitis.  She underwent I&D of the left elbow by orthopedic surgery on 1/10.  TEE negative for endocarditis.  Infectious disease recommended IV antibiotics for 6 weeks.  Therapy recommended SNF but patient has no insurance.  Cannot be discharged home with IV antibiotics as she is homeless.  Hospital course complicated acute respiratory failure due to hospital-acquired pneumonia, blood loss anemia due to recurrent epistaxis in the setting of coagulopathy, and AKI.  Subjective: Seen and examined earlier this morning.  No major events overnight or this morning.  Slight drop in hemoglobin this morning, down to 6.8 from 7.2 likely hemodilution.  No signs of bleeding.   Objective: Vitals:   04/04/20 0829 04/04/20 1400 04/05/20 0426 04/05/20 0934  BP: (!) 144/88 127/75 135/77 140/84  Pulse: 90 79 85 87  Resp: 16 16 19    Temp: 98.1 F (36.7 C) 97.8 F (36.6 C) 97.6 F (36.4 C)   TempSrc:  Oral    SpO2: 98% 99% 98%   Weight:      Height:        Intake/Output Summary (Last 24 hours) at 04/05/2020 1213 Last data filed at 04/04/2020 1440 Gross per 24 hour  Intake 120 ml  Output -  Net 120 ml   Filed Weights   03/27/2020 0952  Weight: 93.9 kg    Examination:  GENERAL: No apparent distress.  Nontoxic. HEENT: MMM.  Vision and hearing grossly intact.  NECK: Supple.  No apparent JVD.  RESP: On RA.  No IWOB.  Fair aeration bilaterally. CVS:   RRR. Heart sounds normal.  ABD/GI/GU: BS+. Abd soft, NTND.  MSK/EXT:  Moves extremities. No apparent deformity.  Slight swelling in RUE. SKIN: no apparent skin lesion or wound NEURO: Awake, alert and oriented x4 but no great insight.  No apparent focal neuro deficit. PSYCH: Calm. Normal affect.  Procedures:  I&D of the elbow by orthopedic surgery 1/10.  Called  Microbiology summarized: 1/9-blood cultures pansensitive staph auris. 1/11-blood cultures NGTD.  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 -S/p I&D of left elbow by orthopedic surgery on 1/10 -Surgical culture grew MSSA.  Repeat blood culture on 1/11 negative.  TEE negative for endocarditis. -ID, Dr. Gale Journey. recommended 6 weeks of IV cefazolin but unable to set up home health as patient is homeless and has no insurance.  An alternative is 4 weeks of IV antibiotics and two weeks of oral antibiotics (linezolid).  Acute metabolic encephalopathy: Alert and oriented x4 but no great insight.  Probably in the setting of the above. MRI brain without acute finding.  No focal neurodeficits. -Treat treatable causes. -Reorientation and delirium precaution  Acute hypoxemic respiratory failure due to HAP: Resolved.  Desaturated to 87% on RA. CT chest on 1/17 obtained which revealed right apical lung mass concerning for neoplasm, potentially with chest wall invasion as well as bilateral lower  lobe infiltrates, no evidence of cavitary pneumonia/lung abscess, ill-defined peripancreatic tissue, left thyroid nodule -IV Zosyn on 1/17>>IV cefepime 1/18-1/23.   Right apical lung mass -Consult IR for possible CT-guided biopsy next week.   Left thyroid nodule: TFT shows euthyroid sick syndrome with low TSH and normal free T4. -Outpatient thyroid US and biopsy   Ill-defined peripancreatic  tissue -Lipase 60, no nausea, vomiting, epigastric abdominal pain  AKI on CKD stage IIIb/azotemia: Likely in the setting of poor p.o. intake and ACEi/HCZ. Renal US neg. Recent Labs    03/27/20 0605 03/28/20 0705 03/29/20 0518 03/30/20 0507 03/31/20 0710 04/01/20 0526 04/02/20 0720 04/03/20 0525 04/04/20 0102 04/05/20 0532  BUN 40* 42* 47* 49* 60* 74* 80* 85* 80* 70*  CREATININE 1.69* 1.76* 1.81* 1.73* 2.09* 2.77* 3.08* 3.09* 2.92* 2.59*  -Stop IV fluid -Avoid nephrotoxic meds -Repeat BMP in the morning  Hypernatremia: Resolved. -Resolved   Hypokalemia/hypomagnesemia: Resolved. -Monitor and replenish as appropriate  Metabolic acidosis: Likely due to renal failure. -Continue p.o. sodium bicarbonate -Continue monitoring  Severe hypercalcemia due to primary hyperparathyroidism: Calcium >15 on admission. S/p  bisphosphonate, Calcitonin and IVF.  Resolved. -Followed by endocrinology outpatient, referred for surgery  Weakness with recurrent falls: Multifactorial including hypercalcemia, sepsis, uncontrolled diabetes, anemia and possibly polypharmacy. MRI brain negative for any acute CNS involvement -PT eval recommending SNF placement  Type 2 diabetes mellitus with DKA: A1c 10.4%.  DKA resolved. Recent Labs  Lab 04/04/20 2058 04/05/20 0008 04/05/20 0407 04/05/20 0738 04/05/20 1140  GLUCAP 86 123* 123* 130* 114*  -Continue Lantus, sliding scale insulin  Acute on chronic normocytic anemia: Hemoccult negative. Had recurrent epistaxis and coagulopathy.  LDH slightly elevated.  There is also some element of hemodilution from IV fluid.  Folic acid is slightly low. Recent Labs    03/30/20 0507 03/31/20 0710 03/31/20 2045 04/01/20 0526 04/02/20 0720 04/02/20 1659 04/03/20 0525 04/03/20 1652 04/04/20 0102 04/05/20 0532  HGB 8.2* 6.0* 7.4* 7.1* 6.4* 7.1* 6.6* 7.3* 7.2* 6.8*  -Stop IV fluid -Follow haptoglobin -Replenish folic acid  Recurrent  epistaxis/coagulopathy: Epistaxis seems to have stopped.  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  She has significant hypoalbuminemia as well. [place] with normal sonographic appearance of the liver. -Monitor coagulopathy labs  Hypertension: Normotensive -Continue to hold lisinopril/HCTZ.  Continue metoprolol  Hyperlipidemia -Continue Lipitor  Depression: Stable -Continue Zoloft  Sore throat/odynophagia -Chloraseptic spray and Magic mouthwash with lidocaine  Superficial DVT involving the right cephalic vein -No indication for treatment, especially in the setting of coagulopathy.  Rectovaginal fistula -Needs outpatient follow-up with gynecology  Body mass index is 28.87 kg/m.         DVT prophylaxis:  SCDs Start: 04/09/2020 1608  Code Status: Full code Family Communication: Updated patient's son over the phone.  Status is: Inpatient  Remains inpatient appropriate because:Unsafe d/c plan, IV treatments appropriate due to intensity of illness or inability to take PO and Inpatient level of care appropriate due to severity of illness   Dispo: The patient is from: Homeless               Anticipated d/c is to: SNF              Anticipated d/c date is: > 3 days              Patient currently is medically stable to d/c.       Consultants:  Infectious disease Cardiology Orthopedic surgery   Sch Meds:  Scheduled Meds: . atorvastatin  40 mg  Oral Daily  . docusate sodium  100 mg Oral BID  . fenofibrate  160 mg Oral Daily  . fluconazole  150 mg Oral Once  . insulin aspart  0-15 Units Subcutaneous Q4H  . insulin aspart  5 Units Subcutaneous TID WC  . insulin glargine  18 Units Subcutaneous QHS  . melatonin  3 mg Oral QHS  . metoprolol succinate  100 mg Oral Daily  . nystatin   Topical TID  . sertraline  100 mg Oral Daily  . sodium bicarbonate  650 mg Oral TID   Continuous Infusions: . ceFEPime (MAXIPIME) IV 2 g (04/05/20 0524)   PRN Meds:.dextrose, liver oil-zinc  oxide, magic mouthwash w/lidocaine, metoCLOPramide **OR** metoCLOPramide (REGLAN) injection, ondansetron **OR** ondansetron (ZOFRAN) IV, oxymetazoline, phenol  Antimicrobials: Anti-infectives (From admission, onward)   Start     Dose/Rate Route Frequency Ordered Stop   04/02/20 0522  ceFEPIme (MAXIPIME) 2 g in sodium chloride 0.9 % 100 mL IVPB        2 g 200 mL/hr over 30 Minutes Intravenous Every 24 hours 04/01/20 0932     03/31/20 1800  ceFEPIme (MAXIPIME) 2 g in sodium chloride 0.9 % 100 mL IVPB  Status:  Discontinued        2 g 200 mL/hr over 30 Minutes Intravenous Every 12 hours 03/31/20 1244 04/01/20 0932   03/30/20 1030  piperacillin-tazobactam (ZOSYN) IVPB 3.375 g  Status:  Discontinued        3.375 g 12.5 mL/hr over 240 Minutes Intravenous Every 8 hours 03/30/20 1006 03/31/20 1236   03/29/20 1300  fluconazole (DIFLUCAN) tablet 150 mg        150 mg Oral  Once 03/29/20 1201     03/18/2020 1145  ceFAZolin (ANCEF) IVPB 2g/100 mL premix  Status:  Discontinued        2 g 200 mL/hr over 30 Minutes Intravenous Every 8 hours 03/20/2020 1055 03/30/20 0951   04/08/2020 1100  nafcillin 12 g in sodium chloride 0.9 % 500 mL continuous infusion  Status:  Discontinued        12 g 20.8 mL/hr over 24 Hours Intravenous Every 24 hours 03/22/2020 0949 04/06/2020 1055   03/22/20 1500  nafcillin 2 g in sodium chloride 0.9 % 100 mL IVPB  Status:  Discontinued        2 g 200 mL/hr over 30 Minutes Intravenous Every 4 hours 03/22/20 1354 04/03/2020 0949   03/22/20 1345  nafcillin injection 2 g  Status:  Discontinued        2 g Intravenous Every 4 hours 03/22/20 1344 03/22/20 1354   03/22/20 1300  ceFAZolin (ANCEF) IVPB 2g/100 mL premix  Status:  Discontinued        2 g 200 mL/hr over 30 Minutes Intravenous Every 8 hours 03/22/20 1257 03/22/20 1344       I have personally reviewed the following labs and images: CBC: Recent Labs  Lab 03/29/20 2303 03/30/20 0507 04/01/20 0526 04/02/20 0720 04/02/20 1659  04/03/20 0525 04/03/20 1652 04/04/20 0102 04/05/20 0532  WBC 20.2*   < > 17.5* 16.2*  --  13.1*  --  12.1* 10.5  NEUTROABS 19.2*  --   --   --   --   --   --   --   --   HGB 7.8*   < > 7.1* 6.4* 7.1* 6.6* 7.3* 7.2* 6.8*  HCT 24.1*   < > 22.8* 21.5* 22.5* 20.1* 22.6* 22.6* 21.8*  MCV 84.3   < >  87.0 89.6  --  83.1  --  85.6 88.6  PLT 350   < > 325 321  --  PLATELET CLUMPS NOTED ON SMEAR, UNABLE TO ESTIMATE  --  254 211   < > = values in this interval not displayed.   BMP &GFR Recent Labs  Lab 04/01/20 0526 04/02/20 0720 04/03/20 0525 04/04/20 0102 04/05/20 0532  NA 144 139 136 136 137  K 3.6 3.8 4.2 3.7 3.6  CL 115* 113* 111 112* 115*  CO2 17* 14* 13* 15* 13*  GLUCOSE 116* 97 106* 145* 138*  BUN 74* 80* 85* 80* 70*  CREATININE 2.77* 3.08* 3.09* 2.92* 2.59*  CALCIUM 9.4 9.4 9.0 8.9 8.8*  MG 1.8 1.5* 1.8 1.8 1.6*  PHOS  --  4.0 4.2 3.8 4.1   Estimated Creatinine Clearance: 27.7 mL/min (A) (by C-G formula based on SCr of 2.59 mg/dL (H)). Liver & Pancreas: Recent Labs  Lab 04/02/20 0720 04/03/20 0525 04/04/20 0102 04/05/20 0532  ALBUMIN 1.4* 1.3* 1.3* 1.3*   Recent Labs  Lab 03/30/20 1311  LIPASE 60*   No results for input(s): AMMONIA in the last 168 hours. Diabetic: No results for input(s): HGBA1C in the last 72 hours. Recent Labs  Lab 04/04/20 2058 04/05/20 0008 04/05/20 0407 04/05/20 0738 04/05/20 1140  GLUCAP 86 123* 123* 130* 114*   Cardiac Enzymes: No results for input(s): CKTOTAL, CKMB, CKMBINDEX, TROPONINI in the last 168 hours. No results for input(s): PROBNP in the last 8760 hours. Coagulation Profile: Recent Labs  Lab 04/01/20 2017 04/03/20 0525 04/04/20 0102 04/05/20 0532  INR 3.8* 1.3* 1.3* 1.4*   Thyroid Function Tests: No results for input(s): TSH, T4TOTAL, FREET4, T3FREE, THYROIDAB in the last 72 hours. Lipid Profile: No results for input(s): CHOL, HDL, LDLCALC, TRIG, CHOLHDL, LDLDIRECT in the last 72 hours. Anemia Panel: Recent  Labs    04/03/20 0811  VITAMINB12 1,085*  FOLATE 5.4*  FERRITIN 1,374*  TIBC 140*  IRON 29  RETICCTPCT 1.5   Urine analysis:    Component Value Date/Time   COLORURINE YELLOW 03/30/2020 1330   APPEARANCEUR CLOUDY (A) 03/30/2020 1330   LABSPEC 1.020 03/30/2020 1330   PHURINE 5.0 03/30/2020 1330   GLUCOSEU 150 (A) 03/30/2020 1330   HGBUR SMALL (A) 03/30/2020 1330   BILIRUBINUR NEGATIVE 03/30/2020 1330   BILIRUBINUR Neg 11/21/2014 1158   KETONESUR 5 (A) 03/30/2020 1330   PROTEINUR 100 (A) 03/30/2020 1330   UROBILINOGEN 0.2 01/04/2015 1856   NITRITE NEGATIVE 03/30/2020 1330   LEUKOCYTESUR LARGE (A) 03/30/2020 1330   Sepsis Labs: Invalid input(s): PROCALCITONIN, [place]  Microbiology: Recent Results (from the past 240 hour(s))  Culture, blood (routine x 2)     Status: None   Collection Time: 03/29/20 10:51 AM   Specimen: BLOOD  Result Value Ref Range Status   Specimen Description   Final    BLOOD BLOOD RIGHT HAND Performed at [facility] [address], [place], [place] [address]    Special Requests   Final    BOTTLES DRAWN AEROBIC AND ANAEROBIC Blood Culture adequate volume Performed at [facility] [address] [place], [place] [address]    Culture   Final    NO GROWTH 5 DAYS Performed at [facility], [place] [address] [place], [place] [address]    Report Status 04/03/2020 FINAL  Final  Culture, blood (routine x 2)     Status: None   Collection Time: 03/29/20 10:52 AM   Specimen:  BLOOD  Result Value Ref Range Status   Specimen Description   Final    BLOOD BLOOD RIGHT HAND Performed at [facility] [address] [place], [place] [address]    Special Requests   Final    BOTTLES DRAWN AEROBIC ONLY Blood Culture results may not be optimal due to an inadequate volume of blood received in culture bottles Performed at [facility] [address], [place], [place] [address]     Culture   Final    NO GROWTH 5 DAYS Performed at [facility], [place] [address], [place], [place] [address]    Report Status 04/03/2020 FINAL  Final  Culture, Urine     Status: Abnormal   Collection Time: 03/30/20  1:30 PM   Specimen: Urine, Clean Catch  Result Value Ref Range Status   Specimen Description   Final    URINE, CLEAN CATCH Performed at [facility], [place] [address], [place], [place] [address]    Special Requests   Final    NONE Performed at [facility], [place] [address], [place], [place] [address]    Culture >=100,000 COLONIES/mL YEAST (A)  Final   Report Status 04/01/2020 FINAL  Final    Radiology Studies: US Abdomen Limited RUQ (LIVER/GB)  Result Date: 04/05/2020 CLINICAL DATA:  DKA, sepsis, respiratory failure, anemia and coagulopathy. EXAM: ULTRASOUND ABDOMEN LIMITED RIGHT UPPER QUADRANT COMPARISON:  None. FINDINGS: Gallbladder: The gallbladder is contracted around at least a single shadowing calculus. Common bile duct: Diameter: 3 mm Liver: No focal lesion identified. Within normal limits in parenchymal echogenicity. Portal vein is patent on color Doppler imaging with normal direction of blood flow towards the liver. Other: No ascites visualized in the right upper quadrant. IMPRESSION: 1. Cholelithiasis with contracted gallbladder around at least a single gallstone. 2. Normal sonographic appearance of the liver. Electronically Signed   By: Aletta Edouard M.D.   On: 04/05/2020 09:08     Sergei Delo T. [place]  If 7PM-7AM, please contact night-coverage [email or web address] 04/05/2020, 12:13 PM

## 2020-04-05 NOTE — Progress Notes (Signed)
hgb 6.8, mag 1.6 paged to [employer] provider [employer]

## 2020-04-06 LAB — CBC
HCT: 21 % — ABNORMAL LOW (ref 36.0–46.0)
Hemoglobin: 6.5 g/dL — CL (ref 12.0–15.0)
MCH: 27.1 pg (ref 26.0–34.0)
MCHC: 31 g/dL (ref 30.0–36.0)
MCV: 87.5 fL (ref 80.0–100.0)
Platelets: 200 10*3/uL (ref 150–400)
RBC: 2.4 MIL/uL — ABNORMAL LOW (ref 3.87–5.11)
RDW: 17.1 % — ABNORMAL HIGH (ref 11.5–15.5)
WBC: 8.9 10*3/uL (ref 4.0–10.5)
nRBC: 0 % (ref 0.0–0.2)

## 2020-04-06 LAB — RENAL FUNCTION PANEL
Albumin: 1.3 g/dL — ABNORMAL LOW (ref 3.5–5.0)
Anion gap: 11 (ref 5–15)
BUN: 57 mg/dL — ABNORMAL HIGH (ref 8–23)
CO2: 13 mmol/L — ABNORMAL LOW (ref 22–32)
Calcium: 8.7 mg/dL — ABNORMAL LOW (ref 8.9–10.3)
Chloride: 112 mmol/L — ABNORMAL HIGH (ref 98–111)
Creatinine, Ser: 2.64 mg/dL — ABNORMAL HIGH (ref 0.44–1.00)
GFR, Estimated: 20 mL/min — ABNORMAL LOW (ref >60)
Glucose, Bld: 177 mg/dL — ABNORMAL HIGH (ref 70–99)
Phosphorus: 4.7 mg/dL — ABNORMAL HIGH (ref 2.5–4.6)
Potassium: 3.5 mmol/L (ref 3.5–5.1)
Sodium: 136 mmol/L (ref 135–145)

## 2020-04-06 LAB — GLUCOSE, CAPILLARY
Glucose-Capillary: 121 mg/dL — ABNORMAL HIGH (ref 70–99)
Glucose-Capillary: 129 mg/dL — ABNORMAL HIGH (ref 70–99)
Glucose-Capillary: 132 mg/dL — ABNORMAL HIGH (ref 70–99)
Glucose-Capillary: 138 mg/dL — ABNORMAL HIGH (ref 70–99)
Glucose-Capillary: 148 mg/dL — ABNORMAL HIGH (ref 70–99)
Glucose-Capillary: 153 mg/dL — ABNORMAL HIGH (ref 70–99)
Glucose-Capillary: 97 mg/dL (ref 70–99)

## 2020-04-06 LAB — HEMOGLOBIN AND HEMATOCRIT, BLOOD
HCT: 23.1 % — ABNORMAL LOW (ref 36.0–46.0)
Hemoglobin: 7.6 g/dL — ABNORMAL LOW (ref 12.0–15.0)

## 2020-04-06 LAB — PROTIME-INR
INR: 1.4 — ABNORMAL HIGH (ref 0.8–1.2)
Prothrombin Time: 17 seconds — ABNORMAL HIGH (ref 11.4–15.2)

## 2020-04-06 LAB — OCCULT BLOOD X 1 CARD TO LAB, STOOL: Fecal Occult Bld: POSITIVE — AB

## 2020-04-06 LAB — APTT: aPTT: 60 seconds — ABNORMAL HIGH (ref 24–36)

## 2020-04-06 LAB — PREPARE RBC (CROSSMATCH)

## 2020-04-06 MED ORDER — INSULIN GLARGINE 100 UNIT/ML ~~LOC~~ SOLN
18.0000 [IU] | Freq: Every day | SUBCUTANEOUS | Status: DC
  Administered 2020-04-07: 18 [IU] via SUBCUTANEOUS
  Filled 2020-04-06: qty 0.18

## 2020-04-06 MED ORDER — INSULIN GLARGINE 100 UNIT/ML ~~LOC~~ SOLN
8.0000 [IU] | Freq: Once | SUBCUTANEOUS | Status: AC
  Administered 2020-04-06: 8 [IU] via SUBCUTANEOUS
  Filled 2020-04-06: qty 0.08

## 2020-04-06 MED ORDER — SODIUM CHLORIDE 0.9% IV SOLUTION: Freq: Once | INTRAVENOUS | Status: AC

## 2020-04-06 MED ORDER — CEFAZOLIN IV (FOR PTA / DISCHARGE USE ONLY): 2.0000 g | Freq: Three times a day (TID) | INTRAVENOUS | 0 refills | Status: AC

## 2020-04-06 NOTE — Progress Notes (Signed)
Night provider [place] notified HGB 6.5. Previously 1/23-6.8, 1/22- 7.2

## 2020-04-06 NOTE — Progress Notes (Signed)
Lab called to notify feccal occult that resulted previously is POSISTIVE. Night provider notified. NO blood visibly seen in stool over last 3 nights. Pt does have a stage 2 excoriation on buttocks that's bleeds a small 3cc amount occasionally. Concern for coughing up blood in r/t to recent lung mass recorded via radiologist.

## 2020-04-06 NOTE — Progress Notes (Signed)
PROGRESS NOTE  Deborah Figueroa DJS:[id number] DOB: 1955/07/20   PCP: Ann Held, DO  Patient is from: Homeless  DOA: 03/14/2020 LOS: 53  Chief complaints: Fall  Brief Narrative / Interim history: 65 year old female with PMH of uncontrolled DM-2, chronic hypercalcemia presumed to be due to primary hypothyroidism, bipolar disorder, depression, HTN, HLD and recurrent falls brought to ED by EMS after fall at her hotel room and admitted for DKA, hypercalcemia and sepsis in the setting of MSSA bacteremia, left olecranon bursitis and osteomyelitis.  She underwent I&D of the left elbow by orthopedic surgery on 1/10.  TEE negative for endocarditis.  Infectious disease recommended IV antibiotics for 6 weeks.  Therapy recommended SNF but patient has no insurance.  Cannot be discharged home with IV antibiotics as she is homeless.  Hospital course complicated acute respiratory failure due to hospital-acquired pneumonia, blood loss anemia due to recurrent epistaxis in the setting of coagulopathy, and AKI.  Subjective: Seen and examined earlier this morning.  Reportedly had an episode of gum bleeding last night that has resolved.  Hemoglobin down from 6.8-6.5 this morning.  Still with some degree of coagulopathy.  She has no complaints but not a great historian.  Objective: Vitals:   04/06/20 1209 04/06/20 1243 04/06/20 1343 04/06/20 1520  BP: (!) 158/89 (!) 154/103 (!) 147/93 (!) 144/83  Pulse: 92 89 88 87  Resp: 17 20 20 19   Temp: 98.6 F (37 C) 98.6 F (37 C) 99.1 F (37.3 C) 97.9 F (36.6 C)  TempSrc: Axillary Axillary Oral Oral  SpO2: 96% 95% 96% 96%  Weight:      Height:        Intake/Output Summary (Last 24 hours) at 04/06/2020 1604 Last data filed at 04/06/2020 1521 Gross per 24 hour  Intake 1267 ml  Output --  Net 1267 ml   Filed Weights   03/30/2020 0952  Weight: 93.9 kg    Examination:  GENERAL: No apparent distress.  Nontoxic. HEENT: MMM.  Vision and hearing  grossly intact.  NECK: Supple.  No apparent JVD.  RESP: On RA.  No IWOB.  Fair aeration bilaterally. CVS:  RRR. Heart sounds normal.  ABD/GI/GU: BS+. Abd soft, NTND.  MSK/EXT:  Moves extremities. No apparent deformity.  RUE swelling. SKIN: no apparent skin lesion or wound NEURO: Awake, alert and oriented appropriately but no great insight.  No apparent focal neuro deficit. PSYCH: Calm. Normal affect.   Procedures:  I&D of the elbow by orthopedic surgery 1/10.  Called  Microbiology summarized: 1/9-blood cultures pansensitive staph auris. 1/11-blood cultures NGTD.  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 -S/p I&D of left elbow by orthopedic surgery on 1/10 -Surgical culture grew MSSA.  Repeat blood culture on 1/11 negative.  TEE negative for endocarditis. -ID, Dr. Gale Journey. recommended 6 weeks of IV cefazolin but unable to set up home health as patient is homeless and has no insurance.  An alternative is 4 weeks of IV antibiotics and two weeks of oral antibiotics (linezolid).  Acute metabolic encephalopathy: Alert and oriented x4 but no great insight.  Probably in the setting of the above. MRI brain without acute finding.  No focal neurodeficits. -Treat treatable causes. -Reorientation and delirium precaution  Acute hypoxemic respiratory failure due to HAP: Resolved.  Desaturated to 87% on RA. CT chest on 1/17 obtained which revealed right apical lung  mass concerning for neoplasm, potentially with chest wall invasion as well as bilateral lower lobe infiltrates, no evidence of cavitary pneumonia/lung abscess, ill-defined peripancreatic tissue, left thyroid nodule -IV Zosyn on 1/17>>IV cefepime 1/18-1/23.  Currently on Ancef for MSSA bacteremia.  Right apical lung mass: Discussed with IR, Dr. Laurence Ferrari on 04/06/2020 for possible  needle biopsy, and he recommended PET scan referral to thoracic oncology conference after treatment of pneumonia before considering needle biopsy.  -Ambulatory referral to oncology ordered.  Left thyroid nodule: TFT shows euthyroid sick syndrome with low TSH and normal free T4. -Outpatient thyroid US and biopsy   Ill-defined peripancreatic tissue -Lipase 60, no nausea, vomiting, epigastric abdominal pain  AKI on CKD stage IIIb/azotemia: Likely in the setting of poor p.o. intake and ACEi/HCZ. Renal US neg. Recent Labs    03/28/20 0705 03/29/20 0518 03/30/20 0507 03/31/20 0710 04/01/20 0526 04/02/20 0720 04/03/20 0525 04/04/20 0102 04/05/20 0532 04/06/20 0533  BUN 42* 47* 49* 60* 74* 80* 85* 80* 70* 57*  CREATININE 1.76* 1.81* 1.73* 2.09* 2.77* 3.08* 3.09* 2.92* 2.59* 2.64*  -Avoid nephrotoxic meds -Repeat BMP in the morning  Hypernatremia: Resolved. -Resolved   Hypokalemia/hypomagnesemia: Resolved. -Monitor and replenish as appropriate  Metabolic acidosis: Likely due to renal failure. -Continue p.o. sodium bicarbonate -Continue monitoring  Severe hypercalcemia due to primary hyperparathyroidism: Calcium >15 on admission. S/p  bisphosphonate, Calcitonin and IVF.  Resolved. -Followed by endocrinology outpatient, referred for surgery  Weakness with recurrent falls: Multifactorial including hypercalcemia, sepsis, uncontrolled diabetes, anemia and possibly polypharmacy. MRI brain negative for any acute CNS involvement -PT eval recommending SNF placement  Type 2 diabetes mellitus with DKA: A1c 10.4%.  DKA resolved. Recent Labs  Lab 04/05/20 2006 04/06/20 0009 04/06/20 0417 04/06/20 0750 04/06/20 1108  GLUCAP 171* 153* 148* 132* 129*  -Continue Lantus, sliding scale insulin  Acute on chronic normocytic anemia: Hemoccult negative. Had recurrent epistaxis and coagulopathy.  LDH slightly elevated.  There is also some element of hemodilution from IV fluid.  Folic acid  is slightly low. Recent Labs    03/31/20 0710 03/31/20 2045 04/01/20 0526 04/02/20 0720 04/02/20 1659 04/03/20 0525 04/03/20 1652 04/04/20 0102 04/05/20 0532 04/06/20 0533  HGB 6.0* 7.4* 7.1* 6.4* 7.1* 6.6* 7.3* 7.2* 6.8* 6.5*  -Transfuse 1 unit of PRBC and FFP -Follow haptoglobin -Replenish folic acid  Recurrent epistaxis/coagulopathy: Epistaxis seems to have stopped.  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  She has significant hypoalbuminemia as well. [place] with normal sonographic appearance of the liver. -Monitor coagulopathy labs  Hypertension: Normotensive -Continue to hold lisinopril/HCTZ.  Continue metoprolol  Hyperlipidemia -Continue Lipitor  Depression: Stable -Continue Zoloft  Sore throat/odynophagia -Chloraseptic spray and Magic mouthwash with lidocaine  Superficial DVT involving the right cephalic vein -No indication for treatment, especially in the setting of coagulopathy.  Rectovaginal fistula -Needs outpatient follow-up with gynecology  Body mass index is 28.87 kg/m.         DVT prophylaxis:  SCDs Start: 04/09/2020 1608  Code Status: Full code Family Communication: Updated patient's son over the phone on 1/23/clinical.  Status is: Inpatient  Remains inpatient appropriate because:Unsafe d/c plan, IV treatments appropriate due to intensity of illness or inability to take PO and Inpatient level of care appropriate due to severity of illness   Dispo: The patient is from: Homeless               Anticipated d/c is to: SNF              Anticipated d/c date  is: > 3 days              Patient currently is not medically stable to d/c.       Consultants:  Infectious disease Cardiology Orthopedic surgery Interventional radiology over the phone   Sch Meds:  Scheduled Meds: . atorvastatin  40 mg Oral Daily  . docusate sodium  100 mg Oral BID  . fenofibrate  160 mg Oral Daily  . fluconazole  150 mg Oral Once  . insulin aspart  0-15 Units  Subcutaneous Q4H  . insulin aspart  5 Units Subcutaneous TID WC  . insulin glargine  18 Units Subcutaneous QHS  . melatonin  3 mg Oral QHS  . metoprolol succinate  100 mg Oral Daily  . nystatin   Topical TID  . sertraline  100 mg Oral Daily  . sodium bicarbonate  650 mg Oral TID   Continuous Infusions: .  ceFAZolin (ANCEF) IV 2 g (04/06/20 0504)   PRN Meds:.alum & mag hydroxide-simeth, dextrose, liver oil-zinc oxide, metoCLOPramide **OR** metoCLOPramide (REGLAN) injection, ondansetron **OR** ondansetron (ZOFRAN) IV, oxymetazoline, phenol  Antimicrobials: Anti-infectives (From admission, onward)   Start     Dose/Rate Route Frequency Ordered Stop   04/06/20 0000  ceFAZolin (ANCEF) IVPB        2 g Intravenous Every 8 hours 04/06/20 1604 05/16/20 2359   04/05/20 1800  ceFAZolin (ANCEF) IVPB 2g/100 mL premix        2 g 200 mL/hr over 30 Minutes Intravenous Every 12 hours 04/05/20 1345 05/05/20 2359   04/05/20 1400  ceFAZolin (ANCEF) IVPB 2g/100 mL premix  Status:  Discontinued        2 g 200 mL/hr over 30 Minutes Intravenous Every 8 hours 04/05/20 1301 04/05/20 1345   04/02/20 0522  ceFEPIme (MAXIPIME) 2 g in sodium chloride 0.9 % 100 mL IVPB  Status:  Discontinued        2 g 200 mL/hr over 30 Minutes Intravenous Every 24 hours 04/01/20 0932 04/05/20 1215   03/31/20 1800  ceFEPIme (MAXIPIME) 2 g in sodium chloride 0.9 % 100 mL IVPB  Status:  Discontinued        2 g 200 mL/hr over 30 Minutes Intravenous Every 12 hours 03/31/20 1244 04/01/20 0932   03/30/20 1030  piperacillin-tazobactam (ZOSYN) IVPB 3.375 g  Status:  Discontinued        3.375 g 12.5 mL/hr over 240 Minutes Intravenous Every 8 hours 03/30/20 1006 03/31/20 1236   03/29/20 1300  fluconazole (DIFLUCAN) tablet 150 mg        150 mg Oral  Once 03/29/20 1201     03/16/2020 1145  ceFAZolin (ANCEF) IVPB 2g/100 mL premix  Status:  Discontinued        2 g 200 mL/hr over 30 Minutes Intravenous Every 8 hours 04/10/2020 1055 03/30/20 0951    04/07/2020 1100  nafcillin 12 g in sodium chloride 0.9 % 500 mL continuous infusion  Status:  Discontinued        12 g 20.8 mL/hr over 24 Hours Intravenous Every 24 hours 03/20/2020 0949 04/07/2020 1055   03/22/20 1500  nafcillin 2 g in sodium chloride 0.9 % 100 mL IVPB  Status:  Discontinued        2 g 200 mL/hr over 30 Minutes Intravenous Every 4 hours 03/22/20 1354 04/01/2020 0949   03/22/20 1345  nafcillin injection 2 g  Status:  Discontinued        2 g Intravenous Every 4 hours 03/22/20 1344  03/22/20 1354   03/22/20 1300  ceFAZolin (ANCEF) IVPB 2g/100 mL premix  Status:  Discontinued        2 g 200 mL/hr over 30 Minutes Intravenous Every 8 hours 03/22/20 1257 03/22/20 1344       I have personally reviewed the following labs and images: CBC: Recent Labs  Lab 04/02/20 0720 04/02/20 1659 04/03/20 0525 04/03/20 1652 04/04/20 0102 04/05/20 0532 04/06/20 0533  WBC 16.2*  --  13.1*  --  12.1* 10.5 8.9  HGB 6.4*   < > 6.6* 7.3* 7.2* 6.8* 6.5*  HCT 21.5*   < > 20.1* 22.6* 22.6* 21.8* 21.0*  MCV 89.6  --  83.1  --  85.6 88.6 87.5  PLT 321  --  PLATELET CLUMPS NOTED ON SMEAR, UNABLE TO ESTIMATE  --  254 211 200   < > = values in this interval not displayed.   BMP &GFR Recent Labs  Lab 04/01/20 0526 04/02/20 0720 04/03/20 0525 04/04/20 0102 04/05/20 0532 04/06/20 0533  NA 144 139 136 136 137 136  K 3.6 3.8 4.2 3.7 3.6 3.5  CL 115* 113* 111 112* 115* 112*  CO2 17* 14* 13* 15* 13* 13*  GLUCOSE 116* 97 106* 145* 138* 177*  BUN 74* 80* 85* 80* 70* 57*  CREATININE 2.77* 3.08* 3.09* 2.92* 2.59* 2.64*  CALCIUM 9.4 9.4 9.0 8.9 8.8* 8.7*  MG 1.8 1.5* 1.8 1.8 1.6*  --   PHOS  --  4.0 4.2 3.8 4.1 4.7*   Estimated Creatinine Clearance: 27.2 mL/min (A) (by C-G formula based on SCr of 2.64 mg/dL (H)). Liver & Pancreas: Recent Labs  Lab 04/02/20 0720 04/03/20 0525 04/04/20 0102 04/05/20 0532 04/06/20 0533  ALBUMIN 1.4* 1.3* 1.3* 1.3* 1.3*   No results for input(s): LIPASE,  AMYLASE in the last 168 hours. No results for input(s): AMMONIA in the last 168 hours. Diabetic: No results for input(s): HGBA1C in the last 72 hours. Recent Labs  Lab 04/05/20 2006 04/06/20 0009 04/06/20 0417 04/06/20 0750 04/06/20 1108  GLUCAP 171* 153* 148* 132* 129*   Cardiac Enzymes: No results for input(s): CKTOTAL, CKMB, CKMBINDEX, TROPONINI in the last 168 hours. No results for input(s): PROBNP in the last 8760 hours. Coagulation Profile: Recent Labs  Lab 04/01/20 2017 04/03/20 0525 04/04/20 0102 04/05/20 0532 04/06/20 0533  INR 3.8* 1.3* 1.3* 1.4* 1.4*   Thyroid Function Tests: No results for input(s): TSH, T4TOTAL, FREET4, T3FREE, THYROIDAB in the last 72 hours. Lipid Profile: No results for input(s): CHOL, HDL, LDLCALC, TRIG, CHOLHDL, LDLDIRECT in the last 72 hours. Anemia Panel: No results for input(s): VITAMINB12, FOLATE, FERRITIN, TIBC, IRON, RETICCTPCT in the last 72 hours. Urine analysis:    Component Value Date/Time   COLORURINE YELLOW 03/30/2020 1330   APPEARANCEUR CLOUDY (A) 03/30/2020 1330   LABSPEC 1.020 03/30/2020 1330   PHURINE 5.0 03/30/2020 1330   GLUCOSEU 150 (A) 03/30/2020 1330   HGBUR SMALL (A) 03/30/2020 1330   BILIRUBINUR NEGATIVE 03/30/2020 1330   BILIRUBINUR Neg 11/21/2014 1158   KETONESUR 5 (A) 03/30/2020 1330   PROTEINUR 100 (A) 03/30/2020 1330   UROBILINOGEN 0.2 01/04/2015 1856   NITRITE NEGATIVE 03/30/2020 1330   LEUKOCYTESUR LARGE (A) 03/30/2020 1330   Sepsis Labs: Invalid input(s): PROCALCITONIN, [place]  Microbiology: Recent Results (from the past 240 hour(s))  Culture, blood (routine x 2)     Status: None   Collection Time: 03/29/20 10:51 AM   Specimen: BLOOD  Result Value Ref Range Status   Specimen  Description   Final    BLOOD BLOOD RIGHT HAND Performed at [facility] [address] [place], [place] [address]    Special Requests   Final    BOTTLES DRAWN AEROBIC AND ANAEROBIC Blood  Culture adequate volume Performed at [facility] [address] [place], [place] [address]    Culture   Final    NO GROWTH 5 DAYS Performed at [facility], [place] [address], [place], [place] [address]    Report Status 04/03/2020 FINAL  Final  Culture, blood (routine x 2)     Status: None   Collection Time: 03/29/20 10:52 AM   Specimen: BLOOD  Result Value Ref Range Status   Specimen Description   Final    BLOOD BLOOD RIGHT HAND Performed at [facility] [address] [place], [place] [address]    Special Requests   Final    BOTTLES DRAWN AEROBIC ONLY Blood Culture results may not be optimal due to an inadequate volume of blood received in culture bottles Performed at [facility] [address], [place], [place] [address]    Culture   Final    NO GROWTH 5 DAYS Performed at [facility], [place] [address], [place], [place] [address]    Report Status 04/03/2020 FINAL  Final  Culture, Urine     Status: Abnormal   Collection Time: 03/30/20  1:30 PM   Specimen: Urine, Clean Catch  Result Value Ref Range Status   Specimen Description   Final    URINE, CLEAN CATCH Performed at [facility], [place] [address], [place], [place] [address]    Special Requests   Final    NONE Performed at [facility], [place] [address], [place], [place] [address]    Culture >=100,000 COLONIES/mL YEAST (A)  Final   Report Status 04/01/2020 FINAL  Final    Radiology Studies: No results found.   Dulcie Gammon T. Tolstoy  If 7PM-7AM, please contact night-coverage [email or web address] 04/06/2020, 4:04 PM

## 2020-04-06 NOTE — Progress Notes (Signed)
PT Cancellation Note  Patient Details Name: Deborah Figueroa MRN: [id number] DOB: 08/21/1955   Cancelled Treatment:    Reason Eval/Treat Not Completed: Medical issues which prohibited therapy--low hgb. Will hold PT today and check back another day.    [facility]  Office: [phone] Pager: [phone]

## 2020-04-06 NOTE — Progress Notes (Signed)
Pt bleeding from lip, possibly gums and states she coughed up some blood. Approx 30 cc bright red blood/multiple clots. Provider notified. CBC ordered. Pt suctioned, will continue to monitor. Per staff had oral bleeding last weekend. Lidocaine in magic mouthwash could have been preventing/unsure.

## 2020-04-07 ENCOUNTER — Telehealth: Payer: Self-pay | Admitting: Internal Medicine

## 2020-04-07 DIAGNOSIS — D500 Iron deficiency anemia secondary to blood loss (chronic): Secondary | ICD-10-CM

## 2020-04-07 DIAGNOSIS — K921 Melena: Secondary | ICD-10-CM

## 2020-04-07 DIAGNOSIS — D689 Coagulation defect, unspecified: Secondary | ICD-10-CM

## 2020-04-07 LAB — COMPREHENSIVE METABOLIC PANEL
ALT: <5 U/L (ref 0–44)
AST: 22 U/L (ref 15–41)
Albumin: 1.4 g/dL — ABNORMAL LOW (ref 3.5–5.0)
Alkaline Phosphatase: 73 U/L (ref 38–126)
Anion gap: 11 (ref 5–15)
BUN: 59 mg/dL — ABNORMAL HIGH (ref 8–23)
CO2: 14 mmol/L — ABNORMAL LOW (ref 22–32)
Calcium: 9.1 mg/dL (ref 8.9–10.3)
Chloride: 112 mmol/L — ABNORMAL HIGH (ref 98–111)
Creatinine, Ser: 2.43 mg/dL — ABNORMAL HIGH (ref 0.44–1.00)
GFR, Estimated: 22 mL/min — ABNORMAL LOW (ref >60)
Glucose, Bld: 104 mg/dL — ABNORMAL HIGH (ref 70–99)
Potassium: 3.6 mmol/L (ref 3.5–5.1)
Sodium: 137 mmol/L (ref 135–145)
Total Bilirubin: 0.6 mg/dL (ref 0.3–1.2)
Total Protein: 5.5 g/dL — ABNORMAL LOW (ref 6.5–8.1)

## 2020-04-07 LAB — CBC
HCT: 24 % — ABNORMAL LOW (ref 36.0–46.0)
Hemoglobin: 7.6 g/dL — ABNORMAL LOW (ref 12.0–15.0)
MCH: 27.4 pg (ref 26.0–34.0)
MCHC: 31.7 g/dL (ref 30.0–36.0)
MCV: 86.6 fL (ref 80.0–100.0)
Platelets: 209 10*3/uL (ref 150–400)
RBC: 2.77 MIL/uL — ABNORMAL LOW (ref 3.87–5.11)
RDW: 16.8 % — ABNORMAL HIGH (ref 11.5–15.5)
WBC: 9.1 10*3/uL (ref 4.0–10.5)
nRBC: 0 % (ref 0.0–0.2)

## 2020-04-07 LAB — BPAM RBC
Blood Product Expiration Date: 202201272359
ISSUE DATE / TIME: 202201241220
Unit Type and Rh: 6200

## 2020-04-07 LAB — TYPE AND SCREEN
ABO/RH(D): A POS
Antibody Screen: NEGATIVE
Unit division: 0

## 2020-04-07 LAB — BPAM FFP
Blood Product Expiration Date: 202201292359
ISSUE DATE / TIME: 202201240952
Unit Type and Rh: 600

## 2020-04-07 LAB — PROTIME-INR
INR: 1.4 — ABNORMAL HIGH (ref 0.8–1.2)
Prothrombin Time: 16.6 seconds — ABNORMAL HIGH (ref 11.4–15.2)

## 2020-04-07 LAB — GLUCOSE, CAPILLARY
Glucose-Capillary: 95 mg/dL (ref 70–99)
Glucose-Capillary: 111 mg/dL — ABNORMAL HIGH (ref 70–99)
Glucose-Capillary: 104 mg/dL — ABNORMAL HIGH (ref 70–99)
Glucose-Capillary: 106 mg/dL — ABNORMAL HIGH (ref 70–99)
Glucose-Capillary: 135 mg/dL — ABNORMAL HIGH (ref 70–99)

## 2020-04-07 LAB — PREPARE FRESH FROZEN PLASMA: Unit division: 0

## 2020-04-07 LAB — PHOSPHORUS: Phosphorus: 3.6 mg/dL (ref 2.5–4.6)

## 2020-04-07 LAB — FIBRINOGEN: Fibrinogen: 661 mg/dL — ABNORMAL HIGH (ref 210–475)

## 2020-04-07 LAB — MAGNESIUM: Magnesium: 1.4 mg/dL — ABNORMAL LOW (ref 1.7–2.4)

## 2020-04-07 LAB — APTT: aPTT: 62 seconds — ABNORMAL HIGH (ref 24–36)

## 2020-04-07 MED ORDER — BISACODYL 10 MG RE SUPP
10.0000 mg | Freq: Every day | RECTAL | Status: DC | PRN
  Administered 2020-04-07: 10 mg via RECTAL
  Filled 2020-04-07: qty 1

## 2020-04-07 MED ORDER — PEG-KCL-NACL-NASULF-NA ASC-C 100 G PO SOLR
0.5000 | Freq: Once | ORAL | Status: AC
  Administered 2020-04-07: 100 g via ORAL

## 2020-04-07 MED ORDER — PEG-KCL-NACL-NASULF-NA ASC-C 100 G PO SOLR: 0.5000 | Freq: Once | ORAL | Status: DC

## 2020-04-07 MED ORDER — MAGNESIUM SULFATE 2 GM/50ML IV SOLN
2.0000 g | Freq: Once | INTRAVENOUS | Status: AC
  Administered 2020-04-07: 2 g via INTRAVENOUS
  Filled 2020-04-07: qty 50

## 2020-04-07 MED ORDER — VITAMIN K1 10 MG/ML IJ SOLN
10.0000 mg | Freq: Once | INTRAVENOUS | Status: AC
  Administered 2020-04-07: 10 mg via INTRAVENOUS
  Filled 2020-04-07: qty 1

## 2020-04-07 MED ORDER — PEG-KCL-NACL-NASULF-NA ASC-C 100 G PO SOLR
0.5000 | Freq: Once | ORAL | Status: AC
  Administered 2020-04-07: 100 g via ORAL
  Filled 2020-04-07: qty 1

## 2020-04-07 MED ORDER — POLYETHYLENE GLYCOL 3350 17 G PO PACK
17.0000 g | PACK | Freq: Every day | ORAL | Status: DC
  Administered 2020-04-12 – 2020-04-16 (×2): 17 g via ORAL
  Filled 2020-04-07 (×4): qty 1

## 2020-04-07 MED ORDER — PEG-KCL-NACL-NASULF-NA ASC-C 100 G PO SOLR: 1.0000 | Freq: Once | ORAL | Status: DC

## 2020-04-07 MED ORDER — SORBITOL 70 % SOLN
960.0000 mL | TOPICAL_OIL | Freq: Once | ORAL | Status: AC
  Administered 2020-04-07: 960 mL via RECTAL
  Filled 2020-04-07: qty 473

## 2020-04-07 NOTE — Telephone Encounter (Signed)
Ms. Hemminger has been scheduled to see Dr. Julien Nordmann on 2/1 at 2:15pm w/labs at 1:45pm. Pt is currently in the hospital.

## 2020-04-07 NOTE — Progress Notes (Signed)
PT Cancellation Note  Patient Details Name: Deborah Figueroa MRN: [id number] DOB: 08-18-1955   Cancelled Treatment:    Reason Eval/Treat Not Completed: Patient declined, no reason specified Focused on having BM and suppository.  Declines PT at this time.   Ilay Capshaw,KATHrine E 04/07/2020, 12:05 PM Arlyce Dice, DPT [facility] Pager: [phone] Office: [phone]

## 2020-04-07 NOTE — Progress Notes (Signed)
PROGRESS NOTE  Deborah Figueroa VVO:[id number] DOB: 26-Oct-1955   PCP: Ann Held, DO  Patient is from: Homeless  DOA: 03/29/2020 LOS: 74  Chief complaints: Fall  Brief Narrative / Interim history: 65 year old female with PMH of uncontrolled DM-2, chronic hypercalcemia presumed to be due to primary hypothyroidism, bipolar disorder, depression, HTN, HLD and recurrent falls brought to ED by EMS after fall at her hotel room and admitted for DKA, hypercalcemia and sepsis in the setting of MSSA bacteremia, left olecranon bursitis and osteomyelitis.  She underwent I&D of the left elbow by orthopedic surgery on 1/10.  TEE negative for endocarditis.  Infectious disease recommended IV antibiotics for 6 weeks.  Therapy recommended SNF but patient has no insurance.  She is also homeless.  Hospital course complicated acute respiratory failure due to hospital-acquired pneumonia (resolved), ABL in the setting of recurrent epistaxis, gum bleed and positive Hemoccult, coagulopathy, and AKI.  GI and heme-onc consulted.  GI planning EGD and colonoscopy on 1/27.  Subjective: Seen and examined earlier this morning.  No major events overnight of this morning.  Reports a sense of pressure in his rectum.  Also complains of dry mouth.  Denies chest pain, dyspnea, nausea, vomiting or abdominal pain.  Right arm swelling improved.  Objective: Vitals:   04/06/20 1520 04/06/20 1958 04/07/20 0428 04/07/20 1431  BP: (!) 144/83 140/84 140/88 134/78  Pulse: 87 85 84 85  Resp: 19 19 20 20   Temp: 97.9 F (36.6 C) 98.1 F (36.7 C) 98 F (36.7 C) 98.1 F (36.7 C)  TempSrc: Oral     SpO2: 96% 99% 98% 95%  Weight:      Height:        Intake/Output Summary (Last 24 hours) at 04/07/2020 1503 Last data filed at 04/07/2020 1400 Gross per 24 hour  Intake 934 ml  Output 800 ml  Net 134 ml   Filed Weights   04/10/2020 0952  Weight: 93.9 kg    Examination:  GENERAL: No apparent distress.   Nontoxic. HEENT: MMM.  No oropharyngeal lesion. Poor condition. Vision and hearing grossly intact.  NECK: Supple.  No apparent JVD.  RESP: On RA.  No IWOB.  Fair aeration bilaterally. CVS:  RRR. Heart sounds normal.  ABD/GI/GU: BS+. Abd soft, NTND.  MSK/EXT:  Moves extremities. No apparent deformity. No edema.  SKIN: no apparent skin lesion or wound NEURO: Awake, alert and oriented appropriately but no great insight..  No apparent focal neuro deficit. PSYCH: Calm. Normal affect.  Procedures:  I&D of the elbow by orthopedic surgery 1/10.  Called  Microbiology summarized: 1/9-blood cultures pansensitive staph auris. 1/11-blood cultures NGTD.  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 -S/p I&D of left elbow by orthopedic surgery on 1/10 -Surgical culture grew MSSA.  Repeat blood culture on 1/11 negative.  TEE negative for endocarditis. -ID, Dr. Gale Journey. recommended 6 weeks of IV cefazolin but unable to set up home health as patient is homeless and has no insurance.  Remains on IV Ancef through 2/69/4854  Acute metabolic encephalopathy: Alert and oriented x4 but no great insight.  Probably in the setting of the above. MRI brain without acute finding.  No focal neurodeficits. -Treat treatable causes. -Reorientation and delirium precaution  Acute hypoxemic respiratory failure due to HAP: Resolved.  Desaturated to 87% on RA. 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   Right apical lung mass: Discussed with IR, Dr. Laurence Ferrari on 04/06/2020 for  possible needle biopsy, and he recommended PET scan referral to thoracic oncology conference after treatment of pneumonia before considering needle biopsy.  -Ambulatory referral to oncology ordered.  Scheduled with Dr. Julien Nordmann on 04/14/2020 but not sure if she could be discharged by then  Left thyroid nodule: TFT shows euthyroid sick syndrome with low TSH and normal free T4. -Outpatient thyroid US and biopsy   Ill-defined peripancreatic tissue -Lipase 60, no nausea, vomiting, epigastric abdominal pain  AKI on CKD stage IIIb/azotemia: Likely in the setting of poor p.o. intake and ACEi/HCZ. Renal US neg. Recent Labs    03/29/20 0518 03/30/20 0507 03/31/20 0710 04/01/20 0526 04/02/20 0720 04/03/20 0525 04/04/20 0102 04/05/20 0532 04/06/20 0533 04/07/20 0553  BUN 47* 49* 60* 74* 80* 85* 80* 70* 57* 59*  CREATININE 1.81* 1.73* 2.09* 2.77* 3.08* 3.09* 2.92* 2.59* 2.64* 2.43*  -Avoid nephrotoxic meds -Monitor  Hypernatremia: Resolved. -Resolved   Hypokalemia/hypomagnesemia: Mg 1.4. -Replenish and recheck  Metabolic acidosis: Likely due to renal failure. -Continue p.o. sodium bicarbonate -Continue monitoring  Severe hypercalcemia due to primary hyperparathyroidism: Calcium >15 on admission. S/p  bisphosphonate, Calcitonin and IVF.  Resolved. -Followed by endocrinology outpatient, referred for surgery  Weakness with recurrent falls: Multifactorial including hypercalcemia, sepsis, uncontrolled diabetes, anemia and possibly polypharmacy. MRI brain negative for any acute CNS involvement -PT eval recommending SNF placement  Uncontrolled DM-2 with DKA: A1c 10.4%.  DKA resolved. Recent Labs  Lab 04/06/20 1956 04/06/20 2331 04/07/20 0426 04/07/20 0724 04/07/20 1136  GLUCAP 97 121* 95 111* 104*  -Continue Lantus, sliding scale insulin  Acute on chronic normocytic anemia: Hemoccult positive but no overt GI bleed. Had recurrent epistaxis and coagulopathy. Also gum bleed. LDH  slightly elevated. Folic acid is slightly low. Hgb remains low despite multiple transfusions.  Recent Labs    04/01/20 0526 04/02/20 0720 04/02/20 1659 04/03/20 0525 04/03/20 1652 04/04/20 0102 04/05/20 0532 04/06/20 0533 04/06/20 1746 04/07/20 0553  HGB 7.1* 6.4* 7.1* 6.6* 7.3* 7.2* 6.8* 6.5* 7.6* 7.6*  -Appreciate GI input-plan for EGD and colonoscopy on 04/09/2020 -Monitor H&H -Follow haptoglobin -Replenish folic acid  Recurrent epistaxis/coagulopathy: Epistaxis seems to have stopped.  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  She has significant hypoalbuminemia as well. [place] with normal sonographic appearance of the liver. Received p.o. vitamin K 5 mg x 1 and one unit of FFP -Consulted heme-onc -Consult dietitian  Hypertension: Normotensive -Continue to hold lisinopril/HCTZ.  Continue metoprolol  Hyperlipidemia -Continue Lipitor  Depression: Stable -Continue Zoloft  Sore throat/odynophagia -Chloraseptic spray and Magic mouthwash with lidocaine  Superficial DVT involving the right cephalic vein -No indication for treatment, especially in the setting of coagulopathy.  Rectovaginal fistula? Noted stool from vagina.  -May need GYN eval inpatient or outpatient. Will wait on colonoscopy  Body mass index is 28.87 kg/m.         DVT prophylaxis:  SCDs Start: 03/31/2020 1608  Code Status: Full code Family Communication: Updated patient's son over the phone on 1/23/clinical.  Status is: Inpatient  Remains inpatient appropriate because:Unsafe d/c plan, IV treatments appropriate due to intensity of illness or inability to take PO and Inpatient level of care appropriate due to severity of illness   Dispo:  The patient is from: Homeless               Anticipated d/c is to: SNF              Anticipated d/c date is: > 3 days              Patient currently is not medically stable to d/c.       Consultants:  Infectious disease-signed off Cardiology-signed  off Orthopedic surgery-signed off IR over the phone Gastroenterology Hematology/oncology   Sch Meds:  Scheduled Meds: . atorvastatin  40 mg Oral Daily  . docusate sodium  100 mg Oral BID  . fenofibrate  160 mg Oral Daily  . fluconazole  150 mg Oral Once  . insulin aspart  0-15 Units Subcutaneous Q4H  . insulin aspart  5 Units Subcutaneous TID WC  . insulin glargine  18 Units Subcutaneous QHS  . melatonin  3 mg Oral QHS  . metoprolol succinate  100 mg Oral Daily  . nystatin   Topical TID  . polyethylene glycol  17 g Oral Daily  . sertraline  100 mg Oral Daily  . sodium bicarbonate  650 mg Oral TID   Continuous Infusions: .  ceFAZolin (ANCEF) IV Stopped (04/07/20 0617)  . phytonadione (VITAMIN K) IV     PRN Meds:.alum & mag hydroxide-simeth, bisacodyl, dextrose, liver oil-zinc oxide, metoCLOPramide **OR** metoCLOPramide (REGLAN) injection, ondansetron **OR** ondansetron (ZOFRAN) IV, oxymetazoline, phenol  Antimicrobials: Anti-infectives (From admission, onward)   Start     Dose/Rate Route Frequency Ordered Stop   04/06/20 0000  ceFAZolin (ANCEF) IVPB        2 g Intravenous Every 8 hours 04/06/20 1604 05/16/20 2359   04/05/20 1800  ceFAZolin (ANCEF) IVPB 2g/100 mL premix        2 g 200 mL/hr over 30 Minutes Intravenous Every 12 hours 04/05/20 1345 05/05/20 2359   04/05/20 1400  ceFAZolin (ANCEF) IVPB 2g/100 mL premix  Status:  Discontinued        2 g 200 mL/hr over 30 Minutes Intravenous Every 8 hours 04/05/20 1301 04/05/20 1345   04/02/20 0522  ceFEPIme (MAXIPIME) 2 g in sodium chloride 0.9 % 100 mL IVPB  Status:  Discontinued        2 g 200 mL/hr over 30 Minutes Intravenous Every 24 hours 04/01/20 0932 04/05/20 1215   03/31/20 1800  ceFEPIme (MAXIPIME) 2 g in sodium chloride 0.9 % 100 mL IVPB  Status:  Discontinued        2 g 200 mL/hr over 30 Minutes Intravenous Every 12 hours 03/31/20 1244 04/01/20 0932   03/30/20 1030  piperacillin-tazobactam (ZOSYN) IVPB 3.375 g   Status:  Discontinued        3.375 g 12.5 mL/hr over 240 Minutes Intravenous Every 8 hours 03/30/20 1006 03/31/20 1236   03/29/20 1300  fluconazole (DIFLUCAN) tablet 150 mg        150 mg Oral  Once 03/29/20 1201     03/29/2020 1145  ceFAZolin (ANCEF) IVPB 2g/100 mL premix  Status:  Discontinued        2 g 200 mL/hr over 30 Minutes Intravenous Every 8 hours 04/05/2020 1055 03/30/20 0951   03/17/2020 1100  nafcillin 12 g in sodium chloride 0.9 % 500 mL continuous infusion  Status:  Discontinued        12 g 20.8 mL/hr over 24 Hours Intravenous Every 24 hours 04/08/2020 0949 04/12/2020 1055   03/22/20 1500  nafcillin 2 g in  sodium chloride 0.9 % 100 mL IVPB  Status:  Discontinued        2 g 200 mL/hr over 30 Minutes Intravenous Every 4 hours 03/22/20 1354 04/11/2020 0949   03/22/20 1345  nafcillin injection 2 g  Status:  Discontinued        2 g Intravenous Every 4 hours 03/22/20 1344 03/22/20 1354   03/22/20 1300  ceFAZolin (ANCEF) IVPB 2g/100 mL premix  Status:  Discontinued        2 g 200 mL/hr over 30 Minutes Intravenous Every 8 hours 03/22/20 1257 03/22/20 1344       I have personally reviewed the following labs and images: CBC: Recent Labs  Lab 04/03/20 0525 04/03/20 1652 04/04/20 0102 04/05/20 0532 04/06/20 0533 04/06/20 1746 04/07/20 0553  WBC 13.1*  --  12.1* 10.5 8.9  --  9.1  HGB 6.6*   < > 7.2* 6.8* 6.5* 7.6* 7.6*  HCT 20.1*   < > 22.6* 21.8* 21.0* 23.1* 24.0*  MCV 83.1  --  85.6 88.6 87.5  --  86.6  PLT PLATELET CLUMPS NOTED ON SMEAR, UNABLE TO ESTIMATE  --  254 211 200  --  209   < > = values in this interval not displayed.   BMP &GFR Recent Labs  Lab 04/02/20 0720 04/03/20 0525 04/04/20 0102 04/05/20 0532 04/06/20 0533 04/07/20 0553  NA 139 136 136 137 136 137  K 3.8 4.2 3.7 3.6 3.5 3.6  CL 113* 111 112* 115* 112* 112*  CO2 14* 13* 15* 13* 13* 14*  GLUCOSE 97 106* 145* 138* 177* 104*  BUN 80* 85* 80* 70* 57* 59*  CREATININE 3.08* 3.09* 2.92* 2.59* 2.64* 2.43*   CALCIUM 9.4 9.0 8.9 8.8* 8.7* 9.1  MG 1.5* 1.8 1.8 1.6*  --  1.4*  PHOS 4.0 4.2 3.8 4.1 4.7* 3.6   Estimated Creatinine Clearance: 29.5 mL/min (A) (by C-G formula based on SCr of 2.43 mg/dL (H)). Liver & Pancreas: Recent Labs  Lab 04/03/20 0525 04/04/20 0102 04/05/20 0532 04/06/20 0533 04/07/20 0553  AST  --   --   --   --  22  ALT  --   --   --   --  <5  ALKPHOS  --   --   --   --  73  BILITOT  --   --   --   --  0.6  PROT  --   --   --   --  5.5*  ALBUMIN 1.3* 1.3* 1.3* 1.3* 1.4*   No results for input(s): LIPASE, AMYLASE in the last 168 hours. No results for input(s): AMMONIA in the last 168 hours. Diabetic: No results for input(s): HGBA1C in the last 72 hours. Recent Labs  Lab 04/06/20 1956 04/06/20 2331 04/07/20 0426 04/07/20 0724 04/07/20 1136  GLUCAP 97 121* 95 111* 104*   Cardiac Enzymes: No results for input(s): CKTOTAL, CKMB, CKMBINDEX, TROPONINI in the last 168 hours. No results for input(s): PROBNP in the last 8760 hours. Coagulation Profile: Recent Labs  Lab 04/03/20 0525 04/04/20 0102 04/05/20 0532 04/06/20 0533 04/07/20 0553  INR 1.3* 1.3* 1.4* 1.4* 1.4*   Thyroid Function Tests: No results for input(s): TSH, T4TOTAL, FREET4, T3FREE, THYROIDAB in the last 72 hours. Lipid Profile: No results for input(s): CHOL, HDL, LDLCALC, TRIG, CHOLHDL, LDLDIRECT in the last 72 hours. Anemia Panel: No results for input(s): VITAMINB12, FOLATE, FERRITIN, TIBC, IRON, RETICCTPCT in the last 72 hours. Urine analysis:    Component Value  Date/Time   COLORURINE YELLOW 03/30/2020 1330   APPEARANCEUR CLOUDY (A) 03/30/2020 1330   LABSPEC 1.020 03/30/2020 1330   PHURINE 5.0 03/30/2020 1330   GLUCOSEU 150 (A) 03/30/2020 1330   HGBUR SMALL (A) 03/30/2020 1330   BILIRUBINUR NEGATIVE 03/30/2020 1330   BILIRUBINUR Neg 11/21/2014 1158   KETONESUR 5 (A) 03/30/2020 1330   PROTEINUR 100 (A) 03/30/2020 1330   UROBILINOGEN 0.2 01/04/2015 1856   NITRITE NEGATIVE 03/30/2020  1330   LEUKOCYTESUR LARGE (A) 03/30/2020 1330   Sepsis Labs: Invalid input(s): PROCALCITONIN, [place]  Microbiology: Recent Results (from the past 240 hour(s))  Culture, blood (routine x 2)     Status: None   Collection Time: 03/29/20 10:51 AM   Specimen: BLOOD  Result Value Ref Range Status   Specimen Description   Final    BLOOD BLOOD RIGHT HAND Performed at [facility] [address], [place], [place] [address]    Special Requests   Final    BOTTLES DRAWN AEROBIC AND ANAEROBIC Blood Culture adequate volume Performed at [facility] [address], [place], [place] [address]    Culture   Final    NO GROWTH 5 DAYS Performed at [facility], [place] [address] [place], [place] [address]    Report Status 04/03/2020 FINAL  Final  Culture, blood (routine x 2)     Status: None   Collection Time: 03/29/20 10:52 AM   Specimen: BLOOD  Result Value Ref Range Status   Specimen Description   Final    BLOOD BLOOD RIGHT HAND Performed at [facility] [address] [place], [place] [address]    Special Requests   Final    BOTTLES DRAWN AEROBIC ONLY Blood Culture results may not be optimal due to an inadequate volume of blood received in culture bottles Performed at [facility] [address] [place], [place] [address]    Culture   Final    NO GROWTH 5 DAYS Performed at [facility], [place] [address] [place], [place] [address]    Report Status 04/03/2020 FINAL  Final  Culture, Urine     Status: Abnormal   Collection Time: 03/30/20  1:30 PM   Specimen: Urine, Clean Catch  Result Value Ref Range Status   Specimen Description   Final    URINE, CLEAN CATCH Performed at [facility], [place] [address], [place], [place] [address]    Special Requests   Final    NONE Performed at [facility], [place] [address], [place],  [address]    Culture  >=100,000 COLONIES/mL YEAST (A)  Final   Report Status 04/01/2020 FINAL  Final    Radiology Studies: No results found.   Rashika Bettes T. [place]  If 7PM-7AM, please contact night-coverage [email or web address] 04/07/2020, 3:03 PM

## 2020-04-07 NOTE — Consult Note (Addendum)
Consultation  Referring Provider: Dr. Cyndia Skeeters      Primary Care Physician:  Carollee Herter, Alferd Apa, DO Primary Gastroenterologist: Elinor Parkinson PCP (previously dismissed by Sadie Haber)        Reason for Consultation: Anemia, hemoccult +, Coagulopathy         HPI:   Deborah Figueroa is a 65 y.o. female with a past medical history significant for diabetes type 2, hypertension, hyperlipidemia and others listed below, who initially presented to the ER for complaint of "weakness" on 04/02/2020.  Apparently had had multiple falls over the last 2 weeks and injured herself.  At that time reported increasing fatigue, polyuria and polydipsia.  She was found to be in DKA.  We are consulted now in regards to anemia and hemoccult +.    Today, it should be noted the patient is a poor historian, the patient's primary complaint when I see her is of constipation. Apparently she is regular with two or three bowel movements a day prior to hospitalization and has not had a bowel movement now in the past 2 days. Describes a "very uncomfortable pressure in my rectum". The nurse did just insert a suppository but patient tells me this is not working. Does describe some bloating and a feeling of "bleh", which she thinks is due to not having a bowel movement. Does describe seeing some "dark/ black liquid stool", occasionally at home. She is unable to tell me really when this was or how often it was. Does deny any other overt GI symptoms including heartburn, reflux or seeing bright red blood in her stool.    Also describes some dry mouth today which is "making it hard for me to eat". Denies any dysphagia.    Describes her last EGD and colonoscopy has been "years ago", is unaware of any abnormalities from these procedures.    Denies fever, chills, weight loss, abdominal pain or symptoms that awaken her from sleep.      Hospital course: Patient was found to have MSSA bacteremia in the setting of several weeks of progressive  weakness, ground-level falls and left elbow pain with an x-ray suggestive of possible osseous involvement and right shoulder pain.  03/17/2020-thought to have probable endocarditis and osteomyelitis-ID on board also noted significant hypercalcemia (question of underlying malignancy), underwent I&D of left elbow  03/30/2020-severe hypercalcemia secondary to primary hyperparathyroidism, noted acute on chronic anemia with a baseline hemoglobin of 9.5 (on admission hemoglobin was 7.7 with repeat of 8.9), no reported bleeding episodes  03/25/2020-TEE unremarkable for endocarditis  (See other notes in computer)  Yesterday 04/06/2020-noted that ID had recommended IV antibiotics for 6 weeks, but patient cannot be discharged home with IV antibiotics as she is homeless, hospital course complicated by acute respiratory failure due to hospital-acquired pneumonia, blood loss anemia thought due to recurrent epistaxis in the setting of coagulopathy and AKI-hemoglobin dropped from 6.8-6.5--> 1 unit PRBCs ordered and FFP  Most recent labs with a hemoglobin of 7.6 this morning (6.5--> 1 unit PRBCs yesterday), 04/03/2020 iron 29, TIBC low at 140, ferritin elevated at 1374, percent saturation 21%, folate 5.4, hemoccult +  Past Medical History:  Diagnosis Date  . Cellulitis   . Depression   . Diabetes mellitus without complication ([place])   . Hyperlipidemia   . Hypertension   . Renal insufficiency   . Seasonal allergies     Past Surgical History:  Procedure Laterality Date  . BACK SURGERY    . BREAST SURGERY  lumpectomy left breast  . I & D EXTREMITY Left 03/27/2020   Procedure: IRRIGATION AND DEBRIDEMENT elbow;  Surgeon: Tania Ade, MD;  Location: [facility]S;  Service: Orthopedics;  Laterality: Left;  . LUMBAR FUSION    . TEE WITHOUT CARDIOVERSION N/A 04/09/2020   Procedure: TRANSESOPHAGEAL ECHOCARDIOGRAM (TEE);  Surgeon: Buford Dresser, MD;  Location: [facility] ENDOSCOPY;  Service: Cardiovascular;   Laterality: N/A;    Family History  Problem Relation Age of Onset  . Stroke Mother   . Hypertension Mother   . Hyperlipidemia Mother   . Cancer Father 55       liver and colon  . Depression Father   . Cancer Maternal Aunt        breast  . Diabetes Brother   . Cancer Maternal Grandmother        breast    Social History   Tobacco Use  . Smoking status: Never Smoker  . Smokeless tobacco: Never Used  Vaping Use  . Vaping Use: Never used  Substance Use Topics  . Alcohol use: No  . Drug use: No    Prior to Admission medications   Medication Sig Start Date End Date Taking? Authorizing Provider  atorvastatin (LIPITOR) 40 MG tablet Take 1 tablet (40 mg total) by mouth daily. 03/19/20  Yes Roma Schanz R, DO  ceFAZolin (ANCEF) IVPB Inject 2 g into the vein every 8 (eight) hours. Indication:  MSSA bacteremia w/ associated olecranon bursitis   First Dose: Yes Last Day of Therapy:  05/05/2020 Labs - Once weekly:  CBC/D and BMP, Labs - Every other week:  ESR and CRP Method of administration: IV Push Method of administration may be changed at the discretion of home infusion pharmacist based upon assessment of the patient and/or caregiver's ability to self-administer the medication ordered. 04/06/20 05/16/20 Yes Mercy Riding, MD  fenofibrate 160 MG tablet Take 1 tablet (160 mg total) by mouth daily. 03/19/20  Yes Roma Schanz R, DO  glipiZIDE (GLUCOTROL) 5 MG tablet Take 0.5 tablets (2.5 mg total) by mouth daily before breakfast. 11/25/19  Yes Shamleffer, Melanie Crazier, MD  Insulin Pen Needle 31G X 8 MM MISC 1 daily 03/19/20  Yes Carollee Herter, Kendrick Fries R, DO  Lancets [facility] ULTRASOFT) lancets Use as instructed 10/02/17  Yes Roma Schanz R, DO  lisinopril-hydrochlorothiazide (ZESTORETIC) 20-12.5 MG tablet Take 2 tablets by mouth daily. 03/19/20  Yes Ann Held, DO  methocarbamol (ROBAXIN-750) 750 MG tablet Take 1 tablet (750 mg total) by mouth 4 (four) times daily.  03/19/20  Yes Roma Schanz R, DO  metoprolol succinate (TOPROL-XL) 100 MG 24 hr tablet Take with or immediately following a meal. 03/19/20  Yes Roma Schanz R, DO  sertraline (ZOLOFT) 100 MG tablet Take 1 tablet (100 mg total) by mouth daily. 03/19/20  Yes Ann Held, DO  cyclobenzaprine (FLEXERIL) 5 MG tablet Take 0.5-1 tablets (2.5-5 mg total) by mouth 3 (three) times daily as needed for muscle spasms. DO NOT DRINK ALCOHOL OR DRIVE WHILE TAKING THIS MEDICATION 02/29/20   Volney American, PA-C  glucose blood [facility] VERIO) test strip USE AS DIRECTED TO CHECK BLOOD SUGAR ONCE DAILY Patient not taking: Reported on 03/31/2020 07/31/19   Carollee Herter, Alferd Apa, DO  insulin glargine (LANTUS SOLOSTAR) 100 UNIT/ML Solostar Pen Inject 10 Units into the skin daily. Patient not taking: Reported on 04/01/2020 03/19/20   Ann Held, DO    Current Facility-Administered Medications  Medication Dose Route Frequency Provider Last Rate Last Admin  . alum & mag hydroxide-simeth (MAALOX/MYLANTA) 200-200-20 MG/5ML suspension 30 mL  30 mL Oral Q6H PRN Cyndia Skeeters, Taye T, MD   30 mL at 04/06/20 1821  . atorvastatin (LIPITOR) tablet 40 mg  40 mg Oral Daily Buford Dresser, MD   40 mg at 04/06/20 0936  . bisacodyl (DULCOLAX) suppository 10 mg  10 mg Rectal Daily PRN Hollace Hayward K, NP   10 mg at 04/07/20 1019  . ceFAZolin (ANCEF) IVPB 2g/100 mL premix  2 g Intravenous Q12H Susa Raring, RPH 200 mL/hr at 04/07/20 0547 2 g at 04/07/20 0547  . dextrose 50 % solution 0-50 mL  0-50 mL Intravenous PRN Buford Dresser, MD      . docusate sodium (COLACE) capsule 100 mg  100 mg Oral BID Buford Dresser, MD   100 mg at 04/06/20 2251  . fenofibrate tablet 160 mg  160 mg Oral Daily Buford Dresser, MD   160 mg at 04/06/20 0936  . fluconazole (DIFLUCAN) tablet 150 mg  150 mg Oral Once Dessa Phi, DO      . insulin aspart (novoLOG) injection 0-15 Units  0-15 Units  Subcutaneous Q4H Dessa Phi, DO   2 Units at 04/07/20 0050  . insulin aspart (novoLOG) injection 5 Units  5 Units Subcutaneous TID WC Dessa Phi, DO   5 Units at 04/04/20 0840  . insulin glargine (LANTUS) injection 18 Units  18 Units Subcutaneous QHS Kyere, Belinda K, NP      . liver oil-zinc oxide (DESITIN) 40 % ointment   Topical PRN Gonfa, Taye T, MD      . melatonin tablet 3 mg  3 mg Oral QHS Lang Snow, NP   3 mg at 04/06/20 2251  . metoCLOPramide (REGLAN) tablet 5-10 mg  5-10 mg Oral Q8H PRN Buford Dresser, MD   10 mg at 04/06/20 1307   Or  . metoCLOPramide (REGLAN) injection 5-10 mg  5-10 mg Intravenous Q8H PRN Buford Dresser, MD   10 mg at 04/05/20 1311  . metoprolol succinate (TOPROL-XL) 24 hr tablet 100 mg  100 mg Oral Daily Dessa Phi, DO   100 mg at 04/06/20 0936  . nystatin (MYCOSTATIN/NYSTOP) topical powder   Topical TID Dessa Phi, DO   Given at 04/07/20 1019  . ondansetron (ZOFRAN) tablet 4 mg  4 mg Oral Q6H PRN Buford Dresser, MD   4 mg at 03/31/20 1205   Or  . ondansetron (ZOFRAN) injection 4 mg  4 mg Intravenous Q6H PRN Buford Dresser, MD   4 mg at 04/07/20 1036  . oxymetazoline (AFRIN) 0.05 % nasal spray 3 spray  3 spray Each Nare BID PRN Mercy Riding, MD   3 spray at 04/01/20 1800  . phenol (CHLORASEPTIC) mouth spray 1 spray  1 spray Mouth/Throat PRN Gonfa, Taye T, MD      . polyethylene glycol (MIRALAX / GLYCOLAX) packet 17 g  17 g Oral Daily Kyere, Belinda K, NP      . sertraline (ZOLOFT) tablet 100 mg  100 mg Oral Daily Dessa Phi, DO   100 mg at 04/06/20 0936  . sodium bicarbonate tablet 650 mg  650 mg Oral TID Wendee Beavers T, MD   650 mg at 04/06/20 2250    Allergies as of 03/28/2020  . (No Known Allergies)     Review of Systems:    Constitutional: No weight loss, fever or chills Skin: No rash  Cardiovascular: No  chest pain  Respiratory: No SOB  Gastrointestinal: See HPI and otherwise  negative Genitourinary: No dysuria  Neurological: No headache Musculoskeletal: No new muscle or joint pain Hematologic: No bruising Psychiatric:  +depression   Physical Exam:  Vital signs in last 24 hours: Temp:  [97.9 F (36.6 C)-99.1 F (37.3 C)] 98 F (36.7 C) (01/25 0428) Pulse Rate:  [84-92] 84 (01/25 0428) Resp:  [17-20] 20 (01/25 0428) BP: (140-158)/(83-103) 140/88 (01/25 0428) SpO2:  [95 %-99 %] 98 % (01/25 0428) Last BM Date: 04/05/20 General:   Overweight Caucasian demale appears to be in NAD, Well developed, Well nourished, alert and cooperative Head:  Normocephalic and atraumatic. Eyes:   PEERL, EOMI. No icterus. Conjunctiva pink. Ears:  Normal auditory acuity. Neck:  Supple Throat: Oral cavity and pharynx without inflammation, swelling or lesion.  Lungs: Respirations even and unlabored. Lungs clear to auscultation bilaterally.   No wheezes, crackles, or rhonchi.  Heart: Normal S1, S2. No MRG. Regular rate and rhythm. No peripheral edema, cyanosis or pallor.  Abdomen:  Soft, nondistended, nontender. No rebound or guarding. Normal bowel sounds. No appreciable masses or hepatomegaly. Rectal:  Not performed.  Msk:  Symmetrical without gross deformities. Peripheral pulses intact.  Extremities:  Without edema, no deformity or joint abnormality.  Neurologic:  Alert and  oriented x4;  grossly normal neurologically.   Skin:   Dry and intact without significant lesions or rashes. Psychiatric: Demonstrates good judgement and reason without abnormal affect or behaviors.   LAB RESULTS: Recent Labs    04/05/20 0532 04/06/20 0533 04/06/20 1746 04/07/20 0553  WBC 10.5 8.9  --  9.1  HGB 6.8* 6.5* 7.6* 7.6*  HCT 21.8* 21.0* 23.1* 24.0*  PLT 211 200  --  209   BMET Recent Labs    04/05/20 0532 04/06/20 0533 04/07/20 0553  NA 137 136 137  K 3.6 3.5 3.6  CL 115* 112* 112*  CO2 13* 13* 14*  GLUCOSE 138* 177* 104*  BUN 70* 57* 59*  CREATININE 2.59* 2.64* 2.43*   CALCIUM 8.8* 8.7* 9.1   LFT Recent Labs    04/07/20 0553  PROT 5.5*  ALBUMIN 1.4*  AST 22  ALT <5  ALKPHOS 73  BILITOT 0.6   PT/INR Recent Labs    04/06/20 0533 04/07/20 0553  LABPROT 17.0* 16.6*  INR 1.4* 1.4*     Impression / Plan:   Impression: 1. Anemia: Hemoglobin continually dropping during admission (5 units PRBCs so far and 1 unit FFP), started at 7.7--> 6.7 on 03/27/2020--> 1 unit PRBCs--> 8.0 on 03/29/2020--> 6.0 on 03/31/2020--> 1 unit PRBCs--> 7.4--> 6.4 on 04/02/2020--> 1 unit PRBCs--> 7.1--> 6.6 on 04/03/2020--> 1 unit PRBCs--> 7.3 --> 6.5 on 04/06/2020--> 1 unit PRBCs 7.6, history of epistaxis and bleeding gums, but not thought to be the cause of anemia, now with Hemoccult positive stool; consider GI source versus other 2. Constipation: New for the patient over the past couple of days, likely related to medicines 3. Coagulopathy: Uncertain cause, 7/65/4650 right upper quadrant ultrasound with no focal liver lesion, within normal limits and parenchymal echogenicity 4. 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 Acute hypoxemic respiratory failure due to HAP 6. Right apical lung mass: Has been referred to oncology 7. Severe hypercalcemia due to primary hyperparathyroidism: Resolved 8. 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  Continue to monitor hemoglobin with transfusion as needed less than seven 2. Patient would likely benefit from an EGD and colonoscopy at some point during this admission for further evaluation of anemia and Hemoccult-positive stools. Will discuss timing further with Dr. Tarri Glenn who will see the patient this afternoon. 3. Ordered a SMOG enema 4. Please await further recommendations from Dr. Tarri Glenn later today.  Thank you for your kind  consultation, we will continue to follow.  Lavone Nian [facility]  04/07/2020, 11:29 AM

## 2020-04-07 NOTE — Consult Note (Signed)
[facility] CONSULT NOTE  Patient Care Team: Carollee Herter, Alferd Apa, DO as PCP - General  CHIEF COMPLAINTS/PURPOSE OF CONSULTATION:  Anemia  ASSESSMENT & PLAN:  No problem-specific Assessment & Plan notes found for this encounter.  1. Normocytic normochromic anemia. Suspect blood loss, no evidence of hemolysis or overt DIC Mild coagulopathy likey from ongoing infection, no evidence of active DIC I agree with endoscopy and colonoscopy to rule out blood loss No evidence of nutritional deficiencies. If no evidence of blood loss anemia, this could also be from combination of bone marrow suppression from acute infection along with CKD. We can consider a BMB if anemia continue to persist No need to reverse INR at 1.4 unless active bleeding is noted.  2. Right lung mass. Agree with out patient PET CT after a couple weeks from discharge Consider IR guided biopsy outpatient unless she ends up staying in the hospital for rest of the abx duration We will monitor her periodically.    HISTORY OF PRESENTING ILLNESS:  Deborah Figueroa 65 y.o. female is here because of severe anemia, new lung mass  Deborah Figueroa is admitted to the hospital with chief complaint of weakness and several falls. She apparently had multiple falls over the last 2 weeks, and also reported fatigue, polyuria and polydipsia, She was found to be in DKA at the time of the presentation. She tells me that she is very healthy at baseline, lives in a hotel, has three kids who live close by and has decent social support, walks regularly. She mentioned dark liquid stool at times, no bright red blood in stool. She denies any known nutritional deficiencies. She denies any known hematuria, No other complaints for me today She just had an enema and had stool and urine incontinence at the time of my visit. GI has seen her inpatient and are planning to do EGD and colonoscopy. She is also being treated for MSSA bacteremia, IV  Abx recommended for 6 weeks but patient cannot be discharged to home on abx since she is homeless She is also diagnosed with hospital acquired pneumonia, and possible right lung nodule concerning for malignancy. Rest of the pertinent 10 point ROS reviewed and unremarkable.  MEDICAL HISTORY:  Past Medical History:  Diagnosis Date  . Cellulitis   . Depression   . Diabetes mellitus without complication ([place])   . Hyperlipidemia   . Hypertension   . Renal insufficiency   . Seasonal allergies     SURGICAL HISTORY: Past Surgical History:  Procedure Laterality Date  . BACK SURGERY    . BREAST SURGERY     lumpectomy left breast  . I & D EXTREMITY Left 03/22/2020   Procedure: IRRIGATION AND DEBRIDEMENT elbow;  Surgeon: Tania Ade, MD;  Location: [facility] ORS;  Service: Orthopedics;  Laterality: Left;  . LUMBAR FUSION    . TEE WITHOUT CARDIOVERSION N/A 03/17/2020   Procedure: TRANSESOPHAGEAL ECHOCARDIOGRAM (TEE);  Surgeon: Buford Dresser, MD;  Location: [facility] ENDOSCOPY;  Service: Cardiovascular;  Laterality: N/A;    SOCIAL HISTORY: Social History   Socioeconomic History  . Marital status: Divorced    Spouse name: Not on file  . Number of children: Not on file  . Years of education: Not on file  . Highest education level: Not on file  Occupational History  . Not on file  Tobacco Use  . Smoking status: Never Smoker  . Smokeless tobacco: Never Used  Vaping Use  . Vaping Use: Never used  Substance and  Sexual Activity  . Alcohol use: No  . Drug use: No  . Sexual activity: Not on file  Other Topics Concern  . Not on file  Social History Narrative  . Not on file   Social Determinants of Health   Financial Resource Strain: Not on file  Food Insecurity: Not on file  Transportation Needs: Not on file  Physical Activity: Not on file  Stress: Not on file  Social Connections: Not on file  Intimate Partner Violence: Not on file    FAMILY HISTORY: Family History  Problem  Relation Age of Onset  . Stroke Mother   . Hypertension Mother   . Hyperlipidemia Mother   . Cancer Father 19       liver and colon  . Depression Father   . Cancer Maternal Aunt        breast  . Diabetes Brother   . Cancer Maternal Grandmother        breast    ALLERGIES:  has No Known Allergies.  MEDICATIONS:  Current Facility-Administered Medications  Medication Dose Route Frequency Provider Last Rate Last Admin  . alum & mag hydroxide-simeth (MAALOX/MYLANTA) 200-200-20 MG/5ML suspension 30 mL  30 mL Oral Q6H PRN Cyndia Skeeters, Taye T, MD   30 mL at 04/06/20 1821  . atorvastatin (LIPITOR) tablet 40 mg  40 mg Oral Daily Buford Dresser, MD   40 mg at 04/06/20 0936  . bisacodyl (DULCOLAX) suppository 10 mg  10 mg Rectal Daily PRN Hollace Hayward K, NP   10 mg at 04/07/20 1019  . ceFAZolin (ANCEF) IVPB 2g/100 mL premix  2 g Intravenous Q12H Susa Raring, RPH 200 mL/hr at 04/07/20 1717 2 g at 04/07/20 1717  . dextrose 50 % solution 0-50 mL  0-50 mL Intravenous PRN Buford Dresser, MD      . docusate sodium (COLACE) capsule 100 mg  100 mg Oral BID Buford Dresser, MD   100 mg at 04/06/20 2251  . fenofibrate tablet 160 mg  160 mg Oral Daily Buford Dresser, MD   160 mg at 04/06/20 0936  . fluconazole (DIFLUCAN) tablet 150 mg  150 mg Oral Once Dessa Phi, DO      . insulin aspart (novoLOG) injection 0-15 Units  0-15 Units Subcutaneous Q4H Dessa Phi, DO   2 Units at 04/07/20 2015  . insulin aspart (novoLOG) injection 5 Units  5 Units Subcutaneous TID WC Dessa Phi, DO   5 Units at 04/04/20 0840  . insulin glargine (LANTUS) injection 18 Units  18 Units Subcutaneous QHS Kyere, Belinda K, NP      . liver oil-zinc oxide (DESITIN) 40 % ointment   Topical PRN Gonfa, Taye T, MD      . melatonin tablet 3 mg  3 mg Oral QHS Lang Snow, NP   3 mg at 04/06/20 2251  . metoCLOPramide (REGLAN) tablet 5-10 mg  5-10 mg Oral Q8H PRN Buford Dresser, MD    10 mg at 04/06/20 1307   Or  . metoCLOPramide (REGLAN) injection 5-10 mg  5-10 mg Intravenous Q8H PRN Buford Dresser, MD   10 mg at 04/05/20 1311  . metoprolol succinate (TOPROL-XL) 24 hr tablet 100 mg  100 mg Oral Daily Dessa Phi, DO   100 mg at 04/06/20 0936  . nystatin (MYCOSTATIN/NYSTOP) topical powder   Topical TID Dessa Phi, DO   Given at 04/07/20 1610  . ondansetron (ZOFRAN) tablet 4 mg  4 mg Oral Q6H PRN Buford Dresser, MD   4  mg at 03/31/20 1205   Or  . ondansetron (ZOFRAN) injection 4 mg  4 mg Intravenous Q6H PRN Buford Dresser, MD   4 mg at 04/07/20 1036  . oxymetazoline (AFRIN) 0.05 % nasal spray 3 spray  3 spray Each Nare BID PRN Mercy Riding, MD   3 spray at 04/01/20 1800  . peg 3350 powder (MOVIPREP) kit 100 g  0.5 kit Oral Once Gonfa, Taye T, MD      . phenol (CHLORASEPTIC) mouth spray 1 spray  1 spray Mouth/Throat PRN Gonfa, Taye T, MD      . polyethylene glycol (MIRALAX / GLYCOLAX) packet 17 g  17 g Oral Daily Kyere, Belinda K, NP      . sertraline (ZOLOFT) tablet 100 mg  100 mg Oral Daily Dessa Phi, DO   100 mg at 04/06/20 0936  . sodium bicarbonate tablet 650 mg  650 mg Oral TID Wendee Beavers T, MD   650 mg at 04/06/20 2250     PHYSICAL EXAMINATION:   Vitals:   04/07/20 1431 04/07/20 2050  BP: 134/78 131/78  Pulse: 85 92  Resp: 20 18  Temp: 98.1 F (36.7 C) 98.1 F (36.7 C)  SpO2: 95% 97%   Filed Weights   03/18/2020 0952  Weight: 207 lb (93.9 kg)    GENERAL:alert, no distress and comfortable, pale SKIN: Bruise noted right frontal bone and peri orbital region. Conjunctival pallor. LUNGS: clear to auscultation and percussion with normal breathing effort in ant lung field HEART: regular rate & rhythm and no murmurs and no lower extremity edema ABDOMEN:abdomen soft, non-tender and normal bowel sounds, she passed liquid stool at the time of my visit. Musculoskeletal:no cyanosis of digits and no clubbing  PSYCH: alert &  oriented x 3 with fluent speech NEURO: no focal motor/sensory deficits  LABORATORY DATA:  I have reviewed the data as listed Lab Results  Component Value Date   WBC 9.1 04/07/2020   HGB 7.6 (L) 04/07/2020   HCT 24.0 (L) 04/07/2020   MCV 86.6 04/07/2020   PLT 209 04/07/2020   Reviewed labs Normocytic normochromic anemia. No hemolysis Creatinine 2.5-3 Albumin noted at 1.4 INR of 1.4 APTT 62 sec Fibrinogen is high, not suggestive of DIC  RADIOGRAPHIC STUDIES: I have personally reviewed the radiological images as listed and agreed with the findings in the report. DG Lumbar Spine Complete  Result Date: 03/19/2020 CLINICAL DATA:  History of multiple falls with low back pain, initial encounter EXAM: LUMBAR SPINE - COMPLETE 4+ VIEW COMPARISON:  None. FINDINGS: Six non rib-bearing lumbar type vertebral bodies are visualized. Changes of prior fusion at L5-6 is noted. Mild osteophytic changes are seen. Facet hypertrophic changes are noted. No soft tissue abnormality is seen. IMPRESSION: Degenerative and postoperative changes without acute abnormality. Electronically Signed   By: Inez Catalina M.D.   On: 03/19/2020 12:29   DG Shoulder Right  Result Date: 03/19/2020 CLINICAL DATA:  Multiple recent falls with right shoulder pain, initial encounter EXAM: RIGHT SHOULDER - 2+ VIEW COMPARISON:  None. FINDINGS: No acute fracture or dislocation is noted. No soft tissue abnormality is seen. Old healed right rib fractures. IMPRESSION: No acute abnormality noted. Electronically Signed   By: Inez Catalina M.D.   On: 03/19/2020 12:21   DG Elbow Complete Left  Result Date: 03/19/2020 CLINICAL DATA:  History of multiple falls with left elbow pain, initial encounter EXAM: LEFT ELBOW - COMPLETE 3+ VIEW COMPARISON:  None. FINDINGS: No acute fracture or dislocation is noted.  Considerable soft tissue swelling is noted over the olecranon with scattered soft tissue calcifications likely related to olecranon bursitis. The  posterior aspect of the olecranon appears somewhat eroded. Possibility of osteomyelitis would deserve consideration IMPRESSION: Changes most consistent with olecranon bursitis. Some irregularity of the olecranon on the lateral film is noted. Possibility of underlying osteomyelitis deserves consideration. MRI may be helpful as clinically indicated. Electronically Signed   By: Inez Catalina M.D.   On: 03/19/2020 12:24   CT Head Wo Contrast  Result Date: 03/19/2020 CLINICAL DATA:  Head trauma, initial encounter. Facial trauma; head trauma, minor, normal mental status. Additional history provided: Patient reports fall 2 weeks ago at work hitting back of head, fall 2 days ago on sidewalk hitting right forehead, swelling/contusion to right eye. EXAM: CT HEAD WITHOUT CONTRAST TECHNIQUE: Contiguous axial images were obtained from the base of the skull through the vertex without intravenous contrast. COMPARISON:  Head CT 02/09/2019. FINDINGS: Brain: Cerebral volume is normal for age. There is no acute intracranial hemorrhage. No demarcated cortical infarct. No extra-axial fluid collection. No evidence of intracranial mass. No midline shift. Vascular: No hyperdense vessel. Skull: Normal. Negative for fracture or focal lesion. Sinuses/Orbits: Visualized orbits show no acute finding. No significant paranasal sinus disease at the imaged levels. Other: Partially imaged forehead/right periorbital hematoma. IMPRESSION: Unremarkable non-contrast CT appearance of the brain for age. No evidence of acute intracranial abnormality. Partially imaged forehead/right periorbital hematoma. Electronically Signed   By: Kellie Simmering DO   On: 03/19/2020 11:48   CT CHEST WO CONTRAST  Result Date: 03/30/2020 CLINICAL DATA:  Pneumonia, abnormal chest radiograph with questionable cavitation in the LEFT lower lobe raising question of lung abscess EXAM: CT CHEST WITHOUT CONTRAST TECHNIQUE: Multidetector CT imaging of the chest was performed  following the standard protocol without IV contrast. Sagittal and coronal MPR images reconstructed from axial data set. COMPARISON:  Chest radiograph 03/30/2020 FINDINGS: Cardiovascular: Atherosclerotic calcifications aorta, proximal great vessels and coronary arteries. 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 Heart upper normal size. No pericardial effusion. Mediastinum/Nodes: Esophagus unremarkable. LEFT thyroid nodule 2.2 x 1.9 cm; recommend thyroid US. (Ref: J Am Coll Radiol. 2015 Feb;12(2): 143-50). Base of cervical region otherwise normal appearance. No thoracic adenopathy. Lungs/Pleura: RIGHT apex mass 2.7 x 2.3 x 2.0 cm concerning for neoplasm, potentially with chest wall invasion. BILATERAL lower lobe infiltrates consistent with atelectasis and pneumonia. No pleural effusion or pneumothorax. Upper Abdomen: Adrenal thickening without mass. Ill-defined peripancreatic tissue planes raising question of pancreatitis at tail, incompletely visualized and assessed. Infiltrative changes appear to extend between the stomach and spleen. Musculoskeletal: Scattered mild degenerative disc disease changes of the thoracic spine. 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 BILATERAL lower lobe infiltrates consistent with atelectasis and pneumonia. No evidence of cavitary pneumonia/lung abscess. Ill-defined peripancreatic tissue planes raising question of pancreatitis at tail, incompletely visualized and assessed; recommend correlation with serum lipase and dedicated CT abdomen. LEFT thyroid nodule 2.2 x 1.9 cm; recommend thyroid ultrasound assessment as above. Aortic Atherosclerosis (ICD10-I70.0). Electronically Signed   By: Lavonia Dana M.D.   On: 03/30/2020 11:20   CT HUMERUS LEFT W CONTRAST  Result Date: 03/26/2020 CLINICAL DATA:  Evaluate olecranon osteomyelitis and abscesses. EXAM: CT OF  THE UPPER LEFT EXTREMITY WITH CONTRAST TECHNIQUE: Multidetector CT imaging of the upper left extremity was performed according to the standard protocol following intravenous contrast administration. CONTRAST:  40m  OMNIPAQUE IOHEXOL 300 MG/ML  SOLN COMPARISON:  Limited MRI examination from 03/22/2020 FINDINGS: There are destructive bony changes involving the olecranon consistent with osteomyelitis. Adjacent inflammatory phlegmon and abscess noted in the region of the olecranon bursa likely septic bursitis. As seen on the MRI there is also a small fluid collection just anterior to the lower biceps muscle. This measured approximately 17 x 10 mm on the MRI and measures approximately 13 x 9 mm on the CT scan. There is also evidence of pyomyositis most likely in the flexor digitorum superficialis muscle of the proximal forearm. The larger abscess measures 15 x 14 mm and the smaller adjacent abscess measures 8 mm. Surrounding myositis and cellulitis is noted. I do not see any findings suspicious for septic arthritis at the elbow joint. IMPRESSION: 1. Osteomyelitis involving the olecranon with adjacent inflammatory phlegmon and abscess in the region of the olecranon bursa. 2. Evidence of pyomyositis most likely in the flexor digitorum superficialis muscle of the proximal forearm. 3. Small abscess along the ventral aspect of the lower biceps muscle. 4. No CT findings suspicious for septic arthritis at the elbow joint. Electronically Signed   By: Marijo Sanes M.D.   On: 04/06/2020 13:32   MR BRAIN WO CONTRAST  Result Date: 03/22/2020 CLINICAL DATA:  Mental status change, multiple falls, right arm numbness EXAM: MRI HEAD WITHOUT CONTRAST TECHNIQUE: Multiplanar, multiecho pulse sequences of the brain and surrounding structures were obtained without intravenous contrast. COMPARISON:  None. FINDINGS: Brain: There is no acute infarction or intracranial hemorrhage. There is no intracranial mass, mass effect, or edema. There is  no hydrocephalus or extra-axial fluid collection. Multiple small foci of susceptibility hypointensity with involvement of the basal ganglia, thalamus, and pons consistent with chronic hypertensive microhemorrhages. Few scattered small foci of T2 hyperintensity in the supratentorial white matter are nonspecific but may reflect minor chronic microvascular ischemic changes. Ventricles and sulci are within normal limits in size and configuration. Vascular: Major vessel flow voids at the skull base are preserved. Skull and upper cervical spine: Marrow signal within normal limits. Sinuses/Orbits: Paranasal sinuses are aerated. Orbits are unremarkable. Other: Right frontal scalp hematoma. Sella is unremarkable. Mastoid air cells are clear. IMPRESSION: No evidence of recent infarction, hemorrhage, or mass. Chronic hypertensive microhemorrhages. Minor chronic microvascular ischemic changes. Electronically Signed   By: Macy Mis M.D.   On: 03/22/2020 16:36   CT FOREARM LEFT W CONTRAST  Result Date: 04/03/2020 CLINICAL DATA:  Evaluate olecranon osteomyelitis and abscesses. EXAM: CT OF THE UPPER LEFT EXTREMITY WITH CONTRAST TECHNIQUE: Multidetector CT imaging of the upper left extremity was performed according to the standard protocol following intravenous contrast administration. CONTRAST:  39m OMNIPAQUE IOHEXOL 300 MG/ML  SOLN COMPARISON:  Limited MRI examination from 03/22/2020 FINDINGS: There are destructive bony changes involving the olecranon consistent with osteomyelitis. Adjacent inflammatory phlegmon and abscess noted in the region of the olecranon bursa likely septic bursitis. As seen on the MRI there is also a small fluid collection just anterior to the lower biceps muscle. This measured approximately 17 x 10 mm on the MRI and measures approximately 13 x 9 mm on the CT scan. There is also evidence of pyomyositis most likely in the flexor digitorum superficialis muscle of the proximal forearm. The larger  abscess measures 15 x 14 mm and the smaller adjacent abscess measures 8 mm. Surrounding myositis and cellulitis is noted. I do not see any findings suspicious for septic arthritis at the elbow joint. IMPRESSION: 1. Osteomyelitis involving the olecranon  with adjacent inflammatory phlegmon and abscess in the region of the olecranon bursa. 2. Evidence of pyomyositis most likely in the flexor digitorum superficialis muscle of the proximal forearm. 3. Small abscess along the ventral aspect of the lower biceps muscle. 4. No CT findings suspicious for septic arthritis at the elbow joint. Electronically Signed   By: Marijo Sanes M.D.   On: 03/17/2020 13:32   US RENAL  Result Date: 04/09/2020 CLINICAL DATA:  Acute renal insufficiency. EXAM: RENAL / URINARY TRACT ULTRASOUND COMPLETE COMPARISON:  None. FINDINGS: Right Kidney: Renal measurements: 10.6 x 4.4 x 4.8 cm = volume: 117.1 mL. No significant hydronephrosis. Left Kidney: Renal measurements: 10.7 x 5.2 x 4.7 cm = volume: 137 mL. Contains a 1.4 cm cyst. Bladder: Appears normal for degree of bladder distention. Other: Cholelithiasis. IMPRESSION: 1. No cause for acute renal insufficiency. 2. Cholelithiasis. Electronically Signed   By: Dorise Bullion III M.D   On: 03/27/2020 19:00   MR ELBOW LEFT WO CONTRAST  Result Date: 03/22/2020 CLINICAL DATA:  Left elbow pain.  MSSA bacteremia. EXAM: MRI OF THE LEFT ELBOW WITHOUT CONTRAST TECHNIQUE: Multiplanar, multisequence MR imaging of the elbow was performed. No intravenous contrast was administered. COMPARISON:  Left elbow x-rays dated March 19, 2020. FINDINGS: Incomplete study. The patient refused further imaging. Sagittal sequence is nondiagnostic. No coronal sequences were obtained. TENDONS Common forearm flexor origin: Intact. Common forearm extensor origin: Intact. Biceps: Intact. Triceps: High-grade partial tear at the insertion, incompletely evaluated. LIGAMENTS Medial stabilizers: Limited evaluation.  Grossly  intact. Lateral stabilizers:  Limited evaluation.  Grossly intact. Cartilage: Not well evaluated by the axial sequences. Joint: Moderate joint effusion. Cubital tunnel: Increased T2 signal of the ulnar nerve within the cubital tunnel, likely reactive. Bones: Patchy marrow edema and heterogeneously decreased T1 marrow signal involving the olecranon and proximal ulna. Other: Proximal forearm flexor compartment intramuscular fluid collection measuring 1.6 x 4.3 cm (series 9, images 1-10), with surrounding muscle edema. Additional incompletely visualized ill-defined fluid collection in the ulnar aspect of the distal upper arm medial to the brachialis muscle measuring 3.1 x 2.0 cm (series 9, image 30). Incompletely visualized 1.0 x 1.8 cm fluid collection in the distal upper arm anterior to the biceps myotendinous junction (series 9, image 30). IMPRESSION: 1. Limited, incomplete study. The patient refused further imaging. 2. 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 3. Moderate joint effusion concerning for septic arthritis. 4. Proximal forearm flexor compartment intramuscular fluid collection measuring 1.6 x 4.3 cm with surrounding muscle edema, concerning for abscess. 5. Additional incompletely visualized small fluid collections in the distal upper arm medial to the brachialis muscle and anterior to the biceps myotendinous junction, also concerning for abscesses. 6. High-grade partial tear of the distal triceps tendon, incompletely evaluated. Electronically Signed   By: Titus Dubin M.D.   On: 03/22/2020 17:40   DG CHEST PORT 1 VIEW  Result Date: 03/30/2020 CLINICAL DATA:  Respiratory failure and hemoptysis. EXAM: PORTABLE CHEST 1 VIEW COMPARISON:  03/26/2020 FINDINGS: The heart size is stable. Worsening bilateral lower lobe pneumonia, left greater than right. There is suggestion potential component of cavitation in the left lower lobe airspace process but this is not very well-defined on the portable  chest x-ray. No edema, pneumothorax or pleural fluid identified. IMPRESSION: Worsening bilateral lower lobe pneumonia, left greater than right. There is suggestion of potential component of cavitation in the left lower lobe airspace process. Electronically Signed   By: Aletta Edouard M.D.   On: 03/30/2020 09:20  DG CHEST PORT 1 VIEW  Result Date: 04/13/2020 CLINICAL DATA:  Leukocytosis EXAM: PORTABLE CHEST 1 VIEW COMPARISON:  February 15, 2019 FINDINGS: The cardiomediastinal silhouette is unchanged in contour.Atherosclerotic calcifications of the aorta. Low lung volumes with bronchovascular crowding. No pleural effusion. No pneumothorax. No acute pleuroparenchymal abnormality. Visualized abdomen is unremarkable. Mild degenerative changes of the thoracic spine. IMPRESSION: No acute cardiopulmonary abnormality. Electronically Signed   By: Valentino Saxon MD   On: 03/17/2020 17:09   ECHOCARDIOGRAM COMPLETE  Result Date: 03/14/2020    ECHOCARDIOGRAM REPORT   Patient Name:   Deborah Figueroa Date of Exam: 04/13/2020 Medical Rec #:  [id number]         Height:       71.0 in Accession #:    [id number]        Weight:       207.0 lb Date of Birth:  1955/12/04         BSA:          2.140 m Patient Age:    66 years          BP:           139/78 mmHg Patient Gender: F                 HR:           123 bpm. Exam Location:  Inpatient Procedure: 2D Echo, Color Doppler and Cardiac Doppler Indications:    Bacteremia R78.81  History:        Patient has no prior history of Echocardiogram examinations.                 CHF; Risk Factors:Hypertension, Dyslipidemia and Diabetes.  Sonographer:    Bernadene Person RDCS Referring Phys: [id number] [place]  1. Left ventricular ejection fraction, by estimation, is 65 to 70%. The left ventricle has hyperdynamic function. The left ventricle has no regional wall motion abnormalities. There is mild left ventricular hypertrophy. Indeterminate diastolic filling due to E-A  fusion. There is a LV mid-cavity gradient, peak 57 mmHg.  2. Right ventricular systolic function is normal. The right ventricular size is normal. Tricuspid regurgitation signal is inadequate for assessing PA pressure.  3. The aortic valve is tricuspid. Aortic valve regurgitation is not visualized. Mild aortic valve sclerosis is present, with no evidence of aortic valve stenosis.  4. The mitral valve is normal in structure. No evidence of mitral valve regurgitation. No evidence of mitral stenosis.  5. The inferior vena cava is normal in size with greater than 50% respiratory variability, suggesting right atrial pressure of 3 mmHg. FINDINGS  Left Ventricle: Left ventricular ejection fraction, by estimation, is 65 to 70%. The left ventricle has hyperdynamic function. The left ventricle has no regional wall motion abnormalities. The left ventricular internal cavity size was normal in size. There is mild left ventricular hypertrophy. Indeterminate diastolic filling due to E-A fusion. Right Ventricle: The right ventricular size is normal. No increase in right ventricular wall thickness. Right ventricular systolic function is normal. Tricuspid regurgitation signal is inadequate for assessing PA pressure. Left Atrium: Left atrial size was normal in size. Right Atrium: Right atrial size was normal in size. Pericardium: There is no evidence of pericardial effusion. Mitral Valve: The mitral valve is normal in structure. No evidence of mitral valve regurgitation. No evidence of mitral valve stenosis. Tricuspid Valve: The tricuspid valve is normal in structure. Tricuspid valve regurgitation is trivial. Aortic Valve: The aortic valve is tricuspid.  Aortic valve regurgitation is not visualized. Mild aortic valve sclerosis is present, with no evidence of aortic valve stenosis. Pulmonic Valve: The pulmonic valve was normal in structure. Pulmonic valve regurgitation is not visualized. Aorta: The aortic root is normal in size and  structure. Venous: The inferior vena cava is normal in size with greater than 50% respiratory variability, suggesting right atrial pressure of 3 mmHg. IAS/Shunts: No atrial level shunt detected by color flow Doppler.  LEFT VENTRICLE PLAX 2D LVIDd:         3.20 cm  Diastology LVIDs:         1.80 cm  LV e' medial:    13.20 cm/s LV PW:         1.30 cm  LV E/e' medial:  9.4 LV IVS:        1.30 cm  LV e' lateral:   14.30 cm/s LVOT diam:     1.90 cm  LV E/e' lateral: 8.7 LV SV:         75 LV SV Index:   35 LVOT Area:     2.84 cm  RIGHT VENTRICLE RV S prime:     19.20 cm/s TAPSE (M-mode): 1.9 cm LEFT ATRIUM             Index       RIGHT ATRIUM           Index LA diam:        2.80 cm 1.31 cm/m  RA Area:     13.20 cm LA Vol (A2C):   44.9 ml 20.99 ml/m RA Volume:   30.50 ml  14.25 ml/m LA Vol (A4C):   45.5 ml 21.27 ml/m LA Biplane Vol: 45.1 ml 21.08 ml/m  AORTIC VALVE LVOT Vmax:   178.00 cm/s LVOT Vmean:  138.000 cm/s LVOT VTI:    0.266 m  AORTA Ao Root diam: 3.30 cm Ao Asc diam:  3.60 cm MITRAL VALVE MV Area (PHT): 8.62 cm     SHUNTS MV Decel Time: 88 msec      Systemic VTI:  0.27 m MV E velocity: 124.00 cm/s  Systemic Diam: 1.90 cm MV A velocity: 81.40 cm/s MV E/A ratio:  1.52 Loralie Champagne MD Electronically signed by Loralie Champagne MD Signature Date/Time: 04/03/2020/5:00:32 PM    Final    ECHO TEE  Result Date: 04/02/2020    TRANSESOPHOGEAL ECHO REPORT   Patient Name:   Deborah Figueroa Date of Exam: 04/08/2020 Medical Rec #:  [id number]         Height:       71.0 in Accession #:    [id number]        Weight:       207.0 lb Date of Birth:  Sep 25, 1955         BSA:          2.140 m Patient Age:    48 years          BP:           120/69 mmHg Patient Gender: F                 HR:           117 bpm. Exam Location:  Inpatient Procedure: Transesophageal Echo and Color Doppler Indications:     bacteremia  History:         Patient has prior history of Echocardiogram examinations, most  recent 03/20/2020.  Sepsis.  Sonographer:     Johny Chess Referring Phys:  [place] Diagnosing Phys: Buford Dresser MD PROCEDURE: After discussion of the risks and benefits of a TEE, an informed consent was obtained from the patient. The transesophogeal probe was passed without difficulty through the esophogus of the patient. Sedation performed by different physician. The patient was monitored while under deep sedation. Anesthestetic sedation was provided intravenously by Anesthesiology: 260.85m of Propofol. The patient's vital signs; including heart rate, blood pressure, and oxygen saturation; remained stable throughout the procedure. The patient developed no complications during the procedure. IMPRESSIONS  1. Left ventricular ejection fraction, by estimation, is 65 to 70%. The left ventricle has hyperdynamic function.  2. Right ventricular systolic function is normal. The right ventricular size is normal.  3. No left atrial/left atrial appendage thrombus was detected.  4. The mitral valve is normal in structure. Trivial mitral valve regurgitation. No evidence of mitral stenosis.  5. The aortic valve is tricuspid. Aortic valve regurgitation is not visualized. Mild aortic valve sclerosis is present, with no evidence of aortic valve stenosis.  6. Aortic focal calcification in aortic root. There is Moderate (Grade III) plaque involving the descending aorta. Conclusion(s)/Recommendation(s): No evidence of vegetation/infective endocarditis on this transesophageal echocardiogram. FINDINGS  Left Ventricle: Left ventricular ejection fraction, by estimation, is 65 to 70%. The left ventricle has hyperdynamic function. The left ventricular internal cavity size was normal in size. Right Ventricle: The right ventricular size is normal. No increase in right ventricular wall thickness. Right ventricular systolic function is normal. Left Atrium: Left atrial size was not assessed. No left atrial/left atrial appendage thrombus  was detected. Right Atrium: Right atrial size was not assessed. Pericardium: Trivial pericardial effusion is present. Mitral Valve: The mitral valve is normal in structure. There is mild thickening of the mitral valve leaflet(s). Trivial mitral valve regurgitation. No evidence of mitral valve stenosis. There is no evidence of mitral valve vegetation. Tricuspid Valve: The tricuspid valve is normal in structure. Tricuspid valve regurgitation is trivial. No evidence of tricuspid stenosis. There is no evidence of tricuspid valve vegetation. Aortic Valve: The aortic valve is tricuspid. Aortic valve regurgitation is not visualized. Mild aortic valve sclerosis is present, with no evidence of aortic valve stenosis. There is no evidence of aortic valve vegetation. Pulmonic Valve: The pulmonic valve was grossly normal. Pulmonic valve regurgitation is trivial. There is no evidence of pulmonic valve vegetation. Aorta: Focal calcification in aortic root. There is moderate (Grade III) plaque involving the descending aorta. IAS/Shunts: No atrial level shunt detected by color flow Doppler. There is no evidence of a patent foramen ovale. There is no evidence of an atrial septal defect. BBuford DresserMD Electronically signed by BBuford DresserMD Signature Date/Time: 04/02/2020/9:34:04 AM    Final    VAS [place]ER EXTREMITY VENOUS DUPLEX  Result Date: 04/01/2020 UPPER VENOUS STUDY  Indications: Edema Risk Factors: None identified. Comparison Study: No prior studies. Performing Technologist: GOliver HumRVT  Examination Guidelines: A complete evaluation includes B-mode imaging, spectral Doppler, color Doppler, and power Doppler as needed of all accessible portions of each vessel. Bilateral testing is considered an integral part of a complete examination. Limited examinations for reoccurring indications may be performed as noted.  Right Findings: +----------+------------+---------+-----------+----------+-------+  RIGHT     CompressiblePhasicitySpontaneousPropertiesSummary +----------+------------+---------+-----------+----------+-------+ IJV           Full       Yes       Yes                      +----------+------------+---------+-----------+----------+-------+  Subclavian    Full       Yes       Yes                      +----------+------------+---------+-----------+----------+-------+ Axillary      Full       Yes       Yes                      +----------+------------+---------+-----------+----------+-------+ Brachial      Full       Yes       Yes                      +----------+------------+---------+-----------+----------+-------+ Radial        Full                                          +----------+------------+---------+-----------+----------+-------+ Ulnar         Full                                          +----------+------------+---------+-----------+----------+-------+ Cephalic      None                                   Acute  +----------+------------+---------+-----------+----------+-------+ Basilic       Full                                          +----------+------------+---------+-----------+----------+-------+  Left Findings: +----------+------------+---------+-----------+----------+-------+ LEFT      CompressiblePhasicitySpontaneousPropertiesSummary +----------+------------+---------+-----------+----------+-------+ Subclavian    Full       Yes       Yes                      +----------+------------+---------+-----------+----------+-------+  Summary:  Right: No evidence of deep vein thrombosis in the upper extremity. Findings consistent with acute superficial vein thrombosis involving the right cephalic vein.  Left: No evidence of thrombosis in the subclavian.  *See table(s) above for measurements and observations.  Diagnosing physician: Monica Martinez MD Electronically signed by Monica Martinez MD on 04/01/2020 at  5:57:42 PM.    Final    [place] EKG SITE RITE  Result Date: 03/27/2020 If Site Rite image not attached, placement could not be confirmed due to current cardiac rhythm.  US Abdomen Limited RUQ (LIVER/GB)  Result Date: 04/05/2020 CLINICAL DATA:  DKA, sepsis, respiratory failure, anemia and coagulopathy. EXAM: ULTRASOUND ABDOMEN LIMITED RIGHT UPPER QUADRANT COMPARISON:  None. FINDINGS: Gallbladder: The gallbladder is contracted around at least a single shadowing calculus. Common bile duct: Diameter: 3 mm Liver: No focal lesion identified. Within normal limits in parenchymal echogenicity. Portal vein is patent on color Doppler imaging with normal direction of blood flow towards the liver. Other: No ascites visualized in the right upper quadrant. IMPRESSION: 1. Cholelithiasis with contracted gallbladder around at least a single gallstone. 2. Normal sonographic appearance of the liver. Electronically Signed   By: Aletta Edouard M.D.   On: 04/05/2020 09:08    All questions were answered. The patient knows to call  the clinic with any problems, questions or concerns. I spent 50 minutes in the care of this patient including H and P, review of records, counseling and coordination of care.     Benay Pike, MD 04/07/2020 9:50 PM

## 2020-04-07 NOTE — Plan of Care (Signed)
  Problem: Clinical Measurements: Goal: Will remain free from infection Outcome: Progressing Goal: Respiratory complications will improve Outcome: Progressing Goal: Cardiovascular complication will be avoided Outcome: Progressing   Problem: Elimination: Goal: Will not experience complications related to bowel motility Outcome: Progressing Goal: Will not experience complications related to urinary retention Outcome: Progressing

## 2020-04-08 LAB — COMPREHENSIVE METABOLIC PANEL
ALT: <5 U/L (ref 0–44)
AST: 20 U/L (ref 15–41)
Albumin: 1.5 g/dL — ABNORMAL LOW (ref 3.5–5.0)
Alkaline Phosphatase: 77 U/L (ref 38–126)
Anion gap: 15 (ref 5–15)
BUN: 61 mg/dL — ABNORMAL HIGH (ref 8–23)
CO2: 14 mmol/L — ABNORMAL LOW (ref 22–32)
Calcium: 9.3 mg/dL (ref 8.9–10.3)
Chloride: 111 mmol/L (ref 98–111)
Creatinine, Ser: 2.4 mg/dL — ABNORMAL HIGH (ref 0.44–1.00)
GFR, Estimated: 22 mL/min — ABNORMAL LOW (ref >60)
Glucose, Bld: 96 mg/dL (ref 70–99)
Potassium: 3.5 mmol/L (ref 3.5–5.1)
Sodium: 140 mmol/L (ref 135–145)
Total Bilirubin: 0.6 mg/dL (ref 0.3–1.2)
Total Protein: 6 g/dL — ABNORMAL LOW (ref 6.5–8.1)

## 2020-04-08 LAB — CBC
HCT: 24 % — ABNORMAL LOW (ref 36.0–46.0)
Hemoglobin: 7.7 g/dL — ABNORMAL LOW (ref 12.0–15.0)
MCH: 27.9 pg (ref 26.0–34.0)
MCHC: 32.1 g/dL (ref 30.0–36.0)
MCV: 87 fL (ref 80.0–100.0)
Platelets: 215 10*3/uL (ref 150–400)
RBC: 2.76 MIL/uL — ABNORMAL LOW (ref 3.87–5.11)
RDW: 17.1 % — ABNORMAL HIGH (ref 11.5–15.5)
WBC: 9.8 10*3/uL (ref 4.0–10.5)
nRBC: 0 % (ref 0.0–0.2)

## 2020-04-08 LAB — APTT: aPTT: 67 seconds — ABNORMAL HIGH (ref 24–36)

## 2020-04-08 LAB — MAGNESIUM: Magnesium: 1.9 mg/dL (ref 1.7–2.4)

## 2020-04-08 LAB — GLUCOSE, CAPILLARY
Glucose-Capillary: 61 mg/dL — ABNORMAL LOW (ref 70–99)
Glucose-Capillary: 67 mg/dL — ABNORMAL LOW (ref 70–99)
Glucose-Capillary: 80 mg/dL (ref 70–99)
Glucose-Capillary: 83 mg/dL (ref 70–99)
Glucose-Capillary: 84 mg/dL (ref 70–99)
Glucose-Capillary: 85 mg/dL (ref 70–99)
Glucose-Capillary: 89 mg/dL (ref 70–99)
Glucose-Capillary: 95 mg/dL (ref 70–99)

## 2020-04-08 LAB — PROTIME-INR
INR: 1.4 — ABNORMAL HIGH (ref 0.8–1.2)
Prothrombin Time: 16.1 seconds — ABNORMAL HIGH (ref 11.4–15.2)

## 2020-04-08 LAB — PHOSPHORUS: Phosphorus: 4.5 mg/dL (ref 2.5–4.6)

## 2020-04-08 MED ORDER — INSULIN ASPART 100 UNIT/ML ~~LOC~~ SOLN
3.0000 [IU] | Freq: Three times a day (TID) | SUBCUTANEOUS | Status: DC
  Administered 2020-04-11 – 2020-04-12 (×4): 3 [IU] via SUBCUTANEOUS

## 2020-04-08 MED ORDER — INSULIN GLARGINE 100 UNIT/ML ~~LOC~~ SOLN
9.0000 [IU] | Freq: Every day | SUBCUTANEOUS | Status: DC
  Administered 2020-04-11 – 2020-04-15 (×5): 9 [IU] via SUBCUTANEOUS
  Filled 2020-04-08 (×8): qty 0.09

## 2020-04-08 MED ORDER — PEG-KCL-NACL-NASULF-NA ASC-C 100 G PO SOLR
0.5000 | Freq: Two times a day (BID) | ORAL | Status: AC
  Administered 2020-04-08 – 2020-04-09 (×2): 100 g via ORAL
  Filled 2020-04-08: qty 1

## 2020-04-08 NOTE — Consult Note (Signed)
Progress Note  Chief Complaint:    anemia     ASSESSMENT / PLAN:    # 65 yo female with DM, HTN, CKD, hyperlipidemia, cholelithiasis. Admitted with DKA,  AKI with metabolic acidosis, hypercalcemia and multiple falls.   # Progressive [place] anemia, FOBT+.  Low TIBC/ normal % iron saturation speaks again iron deficiency.  Baseline 9.5 in 2020,  declining to 6 this admission. She has received 5 units of blood since admission and hgb now stable at 7.7.    Anemia may be multifactorial with recent epistaxis, bleeding gums and possibly GI blood loss.  --Plan is for EGD and colonoscopy tomorrow. The risks and benefits of EGD and colonoscopy with possible polypectomy / biopsies were discussed and the patient agrees to proceed.  --She is on a two day bowel prep so will need additional prep this evening.   # Sepsis secondary to MSSA bacteremia, chronic left olecranon bursitisi with osteomyelitis. On antibiotics. ID recommended 6 weeks of IV antibiotics but no way to arrange given homelessness and lack of insurance.   # Coagulopathy, mild. Given dose of Vitamin K yesterday in event coagulopathy could be nutritional but no change. INR still at 1.4.   # Abnormal peripancreatic tissue, ? Pancreatitis. This was seen on chest CT scan. Lipase only 60.  --Can evaluate further following resolution of more acute issues.  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  --Already has an appt to see Oncology in February.   # Homelessness. Currently living in hotel.    SUBJECTIVE:    Getting her hair done by a hairstylist. No complaints. Completed bowel prep.    OBJECTIVE:     Scheduled inpatient medications:  . atorvastatin  40 mg Oral Daily  . docusate sodium  100 mg Oral BID  . fenofibrate  160 mg Oral Daily  . fluconazole  150 mg Oral Once  . insulin aspart  0-15 Units Subcutaneous Q4H  . insulin aspart   5 Units Subcutaneous TID WC  . insulin glargine  18 Units Subcutaneous QHS  . melatonin  3 mg Oral QHS  . metoprolol succinate  100 mg Oral Daily  . nystatin   Topical TID  . polyethylene glycol  17 g Oral Daily  . sertraline  100 mg Oral Daily  . sodium bicarbonate  650 mg Oral TID   Continuous inpatient infusions:  .  ceFAZolin (ANCEF) IV 2 g (04/08/20 0527)   PRN inpatient medications: alum & mag hydroxide-simeth, bisacodyl, dextrose, liver oil-zinc oxide, metoCLOPramide **OR** metoCLOPramide (REGLAN) injection, ondansetron **OR** ondansetron (ZOFRAN) IV, oxymetazoline, phenol  Vital signs in last 24 hours: Temp:  [98.1 F (36.7 C)-98.7 F (37.1 C)] 98.7 F (37.1 C) (01/26 0523) Pulse Rate:  [85-92] 89 (01/26 0523) Resp:  [18-20] 20 (01/26 0523) BP: (131-139)/(78-83) 139/83 (01/26 0523) SpO2:  [95 %-98 %] 98 % (01/26 0523) Last BM Date: 04/08/20  Intake/Output Summary (Last 24 hours) at 04/08/2020 0859 Last data filed at 04/08/2020 0600 Gross per 24 hour  Intake 1880 ml  Output -  Net 1880 ml     Physical Exam:  . General: Alert female in NAD . Heart:  Regular rate and rhythm. No lower extremity edema . Pulmonary: Normal respiratory effort . Abdomen: Soft, nondistended, nontender. Normal bowel sounds.  . Neurologic: Alert and oriented . Psych: Pleasant. Cooperative.   Filed Weights   03/18/2020 [phone]  Weight: 93.9 kg    Intake/Output from previous day: 01/25 0701 - 01/26 0700 In: 1880 [P.O.:1480; IV Piggyback:400] Out: 200 [Urine:200] Intake/Output this shift: No intake/output data recorded.    Lab Results: Recent Labs    04/06/20 0533 04/06/20 1746 04/07/20 0553 04/08/20 0559  WBC 8.9  --  9.1 9.8  HGB 6.5* 7.6* 7.6* 7.7*  HCT 21.0* 23.1* 24.0* 24.0*  PLT 200  --  209 215   BMET Recent Labs    04/06/20 0533 04/07/20 0553 04/08/20 0559  NA 136 137 140  K 3.5 3.6 3.5  CL 112* 112* 111  CO2 13* 14* 14*  GLUCOSE 177* 104* 96  BUN 57* 59* 61*   CREATININE 2.64* 2.43* 2.40*  CALCIUM 8.7* 9.1 9.3   LFT Recent Labs    04/08/20 0559  PROT 6.0*  ALBUMIN 1.5*  AST 20  ALT <5  ALKPHOS 77  BILITOT 0.6   PT/INR Recent Labs    04/07/20 0553 04/08/20 0559  LABPROT 16.6* 16.1*  INR 1.4* 1.4*   Hepatitis Panel No results for input(s): HEPBSAG, HCVAB, HEPAIGM, HEPBIGM in the last 72 hours.  No results found.    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

## 2020-04-08 NOTE — H&P (View-Only) (Signed)
Progress Note  Chief Complaint:    anemia     ASSESSMENT / PLAN:    # 65 yo female with DM, HTN, CKD, hyperlipidemia, cholelithiasis. Admitted with DKA,  AKI with metabolic acidosis, hypercalcemia and multiple falls.   # Progressive [place] anemia, FOBT+.  Low TIBC/ normal % iron saturation speaks again iron deficiency.  Baseline 9.5 in 2020,  declining to 6 this admission. She has received 5 units of blood since admission and hgb now stable at 7.7.    Anemia may be multifactorial with recent epistaxis, bleeding gums and possibly GI blood loss.  --Plan is for EGD and colonoscopy tomorrow. The risks and benefits of EGD and colonoscopy with possible polypectomy / biopsies were discussed and the patient agrees to proceed.  --She is on a two day bowel prep so will need additional prep this evening.   # Sepsis secondary to MSSA bacteremia, chronic left olecranon bursitisi with osteomyelitis. On antibiotics. ID recommended 6 weeks of IV antibiotics but no way to arrange given homelessness and lack of insurance.   # Coagulopathy, mild. Given dose of Vitamin K yesterday in event coagulopathy could be nutritional but no change. INR still at 1.4.   # Abnormal peripancreatic tissue, ? Pancreatitis. This was seen on chest CT scan. Lipase only 60.  --Can evaluate further following resolution of more acute issues.  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  --Already has an appt to see Oncology in February.   # Homelessness. Currently living in hotel.    SUBJECTIVE:    Getting her hair done by a hairstylist. No complaints. Completed bowel prep.    OBJECTIVE:     Scheduled inpatient medications:  . atorvastatin  40 mg Oral Daily  . docusate sodium  100 mg Oral BID  . fenofibrate  160 mg Oral Daily  . fluconazole  150 mg Oral Once  . insulin aspart  0-15 Units Subcutaneous Q4H  . insulin aspart   5 Units Subcutaneous TID WC  . insulin glargine  18 Units Subcutaneous QHS  . melatonin  3 mg Oral QHS  . metoprolol succinate  100 mg Oral Daily  . nystatin   Topical TID  . polyethylene glycol  17 g Oral Daily  . sertraline  100 mg Oral Daily  . sodium bicarbonate  650 mg Oral TID   Continuous inpatient infusions:  .  ceFAZolin (ANCEF) IV 2 g (04/08/20 0527)   PRN inpatient medications: alum & mag hydroxide-simeth, bisacodyl, dextrose, liver oil-zinc oxide, metoCLOPramide **OR** metoCLOPramide (REGLAN) injection, ondansetron **OR** ondansetron (ZOFRAN) IV, oxymetazoline, phenol  Vital signs in last 24 hours: Temp:  [98.1 F (36.7 C)-98.7 F (37.1 C)] 98.7 F (37.1 C) (01/26 0523) Pulse Rate:  [85-92] 89 (01/26 0523) Resp:  [18-20] 20 (01/26 0523) BP: (131-139)/(78-83) 139/83 (01/26 0523) SpO2:  [95 %-98 %] 98 % (01/26 0523) Last BM Date: 04/08/20  Intake/Output Summary (Last 24 hours) at 04/08/2020 0859 Last data filed at 04/08/2020 0600 Gross per 24 hour  Intake 1880 ml  Output --  Net 1880 ml     Physical Exam:  . General: Alert female in NAD . Heart:  Regular rate and rhythm. No lower extremity edema . Pulmonary: Normal respiratory effort . Abdomen: Soft, nondistended, nontender. Normal bowel sounds.  . Neurologic: Alert and oriented . Psych: Pleasant. Cooperative.   Filed Weights   03/22/2020 [phone]  Weight: 93.9 kg    Intake/Output from previous day: 01/25 0701 - 01/26 0700 In: 1880 [P.O.:1480; IV Piggyback:400] Out: 200 [Urine:200] Intake/Output this shift: No intake/output data recorded.    Lab Results: Recent Labs    04/06/20 0533 04/06/20 1746 04/07/20 0553 04/08/20 0559  WBC 8.9  --  9.1 9.8  HGB 6.5* 7.6* 7.6* 7.7*  HCT 21.0* 23.1* 24.0* 24.0*  PLT 200  --  209 215   BMET Recent Labs    04/06/20 0533 04/07/20 0553 04/08/20 0559  NA 136 137 140  K 3.5 3.6 3.5  CL 112* 112* 111  CO2 13* 14* 14*  GLUCOSE 177* 104* 96  BUN 57* 59* 61*   CREATININE 2.64* 2.43* 2.40*  CALCIUM 8.7* 9.1 9.3   LFT Recent Labs    04/08/20 0559  PROT 6.0*  ALBUMIN 1.5*  AST 20  ALT <5  ALKPHOS 77  BILITOT 0.6   PT/INR Recent Labs    04/07/20 0553 04/08/20 0559  LABPROT 16.6* 16.1*  INR 1.4* 1.4*   Hepatitis Panel No results for input(s): HEPBSAG, HCVAB, HEPAIGM, HEPBIGM in the last 72 hours.  No results found.    Principal Problem:   MSSA bacteremia Active Problems:   Hypercalcemia   Depression, major, single episode, moderate (HCC)   DKA (diabetic ketoacidosis) (HCC)   Acute pain of right shoulder   Left elbow pain   Acute kidney injury ([place])   Weakness   Recurrent falls   Sepsis due to methicillin susceptible Staphylococcus aureus ([place])   Osteomyelitis of left elbow ([place])   Osteomyelitis of left ulna (HCC)   Sepsis ([place])   Blood in the stool   Anemia due to GI blood loss   Coagulopathy ([place])     LOS: 18 days   Tye Savoy ,NP 04/08/2020, 8:59 AM

## 2020-04-08 NOTE — Progress Notes (Signed)
Chart reviewed  Added mixing study for PT and PTT and also ordered Factor II, V and X assays which are in the common pathway of coagulation cascade since both PT and PTT are prolonged. Hemoglobin stable today. Endoscopy tomorrow. We will continue to monitor her periodically.

## 2020-04-08 NOTE — Progress Notes (Signed)
OT Cancellation Note  Patient Details Name: Deborah Figueroa MRN: [id number] DOB: 08/27/1955   Cancelled Treatment:    Reason Eval/Treat Not Completed: Patient declined, no reason specified. Patient reports constant BMs and receiving bowel prep. Wants to hold therapy till after colonoscopy.   Deborah Figueroa 04/08/2020, 12:54 PM

## 2020-04-08 NOTE — Progress Notes (Signed)
Inpatient Diabetes Program Recommendations  AACE/ADA: New Consensus Statement on Inpatient Glycemic Control (2015)  Target Ranges:  Prepandial:   less than 140 mg/dL      Peak postprandial:   less than 180 mg/dL (1-2 hours)      Critically ill patients:  140 - 180 mg/dL   Lab Results  Component Value Date   GLUCAP 67 (L) 04/08/2020   HGBA1C 10.4 (H) 03/19/2020    Review of Glycemic Control  Results for Deborah Figueroa, Deborah Figueroa (MRN [id number]) as of 04/08/2020 11:51  Ref. Range 04/07/2020 20:07 04/08/2020 00:00 04/08/2020 04:29 04/08/2020 07:25 04/08/2020 11:22  Glucose-Capillary Latest Ref Range: 70 - 99 mg/dL 135 (H) 95 85 80 67 (L)     Inpatient Diabetes Program Recommendations:     NPO after midnight tonight for EGD and colonoscopy.   CBG's trending on lower end.    Please consider discontinuing meal coverage 5 units Novolog TID as patient is not requiring meal coverage  Also decrease correction to Novolog 0-9 Q4H and Levemir 8 units QHS  Will continue to follow while inpatient.  Thank you, Reche Dixon, RN, BSN Diabetes Coordinator Inpatient Diabetes Program [phone] (team pager from 8a-5p)

## 2020-04-08 NOTE — Progress Notes (Signed)
PROGRESS NOTE   Deborah Figueroa  ZOX:[id number] DOB: August 24, 1955 DOA: 03/31/2020 PCP: Ann Held, DO  Brief Narrative:  65 year old white female DM TY 2 uncontrolled Chronic hypercalcemia 2/2 hypothyroidism Depression HTN HLD Admitted 03/22/2019 with DKA Found to have sepsis in setting of MSSA bacteremia left olecranon bursitis/osteomyelitis Orthopedics consulted underwent elbow I&D 1/10 infectious disease saw the patient recommended IV antibiotics for 6 weeks Patient developed acute respiratory failure due to hospital-acquired pneumonia as well as blood loss from epistaxis and positive Hemoccult   Assessment & Plan:   Principal Problem:   MSSA bacteremia Active Problems:   Hypercalcemia   Depression, major, single episode, moderate (HCC)   DKA (diabetic ketoacidosis) ([place])   Acute pain of right shoulder   Left elbow pain   Acute kidney injury ([place])   Weakness   Recurrent falls   Sepsis due to methicillin susceptible Staphylococcus aureus ([place])   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 MSSA bacteremia chronic olecranon bursitis and phlegmon a. Stop date for cefazolin appears to be through 05/05/2020-TEE negative for endocarditis-repeat blood culture 1/11 - b. We will discuss with ID if can use different medication or transition to oral medication 2. Acute metabolic encephalopathy a. Seems to have resolved-MRI brain this admission negative 3. Hypoxemic respiratory failure secondary to HCAP a. Desat to 87% previously b. Treated with broad-spectrum antibiotics and now just treating as above for MSSA 4. Right apical mass a. Outpatient further characterization b. We will need to be set up once discharged 5. 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 Metabolic acidosis a. Baseline creatinine prior to this admit about 1.5 back in  2018 b. Avoid nephrotoxins c. Continue bicarb 3 times daily 7. Diabetes mellitus with complications of DKA on admit-currently hypoglycemic a. DKA physiology resolved b. cut back sliding scale coverage from 5 to 3 units 3 times daily meals 8. Severe hypercalcemia a. Likely secondary to DKA and normalized 9. ?  Rectovaginal fistula a. Outpatient characterization 10. Acute on chronic normocytic anemia 11. Epistaxis coagulopathy a. Stable at this time b. Planning for endoscopy colonoscopy given reports of dark stool/bloody stool 12. Superficial DVT right cephalic vein a. Not a candidate for anticoagulation 13. Depression a. Continue Zoloft 100 daily  DVT prophylaxis: Lovenox Code Status: Full Family Communication: None present Disposition:  Status is: Inpatient  Remains inpatient appropriate because:Hemodynamically unstable, Ongoing diagnostic testing needed not appropriate for outpatient work up, Unsafe d/c plan and IV treatments appropriate due to intensity of illness or inability to take PO   Dispo: The patient is from: Home              Anticipated d/c is to: Unknown              Anticipated d/c date is: 3 days              Patient currently is not medically stable to d/c.   Difficult to place patient No       Consultants:   ID  GI    Procedures:   Antimicrobials: Currently cefazolin   Subjective: Awake arousable no distress right arm seems weak to patient Eating drinking Has not been out of bed today Some abdominal pain  Objective: Vitals:   04/07/20 0428 04/07/20 1431 04/07/20 2050 04/08/20 0523  BP: 140/88  134/78 131/78 139/83  Pulse: 84 85 92 89  Resp: '20 20 18 20  ' Temp: 98 F (36.7 C) 98.1 F (36.7 C) 98.1 F (36.7 C) 98.7 F (37.1 C)  TempSrc:      SpO2: 98% 95% 97% 98%  Weight:      Height:        Intake/Output Summary (Last 24 hours) at 04/08/2020 1219 Last data filed at 04/08/2020 0600 Gross per 24 hour  Intake 1780 ml  Output --   Net 1780 ml   Filed Weights   04/02/2020 0952  Weight: 93.9 kg    Examination: EOMI NCAT no focal deficit Thick neck anicteric no pallor CTA B no added sound no rales no rhonchi Abdomen soft nontender Neurologically intact moving all 4 limbs equally Grade 1 lower extremity edema   Data Reviewed: I have personally reviewed following labs and imaging studies  Sodium 140 potassium 3.6 BUN/creatinine 61/2.4 Hemoglobin 7.7 white count 9.8  COVID-19 Labs  No results for input(s): DDIMER, FERRITIN, LDH, CRP in the last 72 hours.  Lab Results  Component Value Date   [place] NEGATIVE 03/22/2020     Radiology Studies: No results found.   Scheduled Meds: . atorvastatin  40 mg Oral Daily  . docusate sodium  100 mg Oral BID  . fenofibrate  160 mg Oral Daily  . fluconazole  150 mg Oral Once  . insulin aspart  0-15 Units Subcutaneous Q4H  . insulin aspart  5 Units Subcutaneous TID WC  . insulin glargine  18 Units Subcutaneous QHS  . melatonin  3 mg Oral QHS  . metoprolol succinate  100 mg Oral Daily  . nystatin   Topical TID  . peg 3350 powder  0.5 kit Oral BID  . polyethylene glycol  17 g Oral Daily  . sertraline  100 mg Oral Daily  . sodium bicarbonate  650 mg Oral TID   Continuous Infusions: .  ceFAZolin (ANCEF) IV 2 g (04/08/20 0527)     LOS: 18 days    Time spent: [place], MD [facility] To contact the attending provider between 7A-7P or the covering provider during after hours 7P-7A, please log into the web site [email or web address] and access using universal [place] password for that web site. If you do not have the password, please call the hospital operator.  04/08/2020, 12:19 PM

## 2020-04-08 NOTE — Progress Notes (Signed)
  Hypoglycemic Event  CBG: 67  Treatment: 4 oz juice/soda  Symptoms: None  Follow-up CBG: Time: 1221 CBG Result: 83  Possible Reasons for Event: Inadequate meal intake  Comments/MD notified: Dr. Trixie Deis, [place] Found

## 2020-04-09 LAB — CBC WITH DIFFERENTIAL/PLATELET
Abs Immature Granulocytes: 0.74 10*3/uL — ABNORMAL HIGH (ref 0.00–0.07)
Basophils Absolute: 0 10*3/uL (ref 0.0–0.1)
Basophils Relative: 0 %
Eosinophils Absolute: 0 10*3/uL (ref 0.0–0.5)
Eosinophils Relative: 0 %
HCT: 24.2 % — ABNORMAL LOW (ref 36.0–46.0)
Hemoglobin: 7.5 g/dL — ABNORMAL LOW (ref 12.0–15.0)
Immature Granulocytes: 8 %
Lymphocytes Relative: 5 %
Lymphs Abs: 0.5 10*3/uL — ABNORMAL LOW (ref 0.7–4.0)
MCH: 27.7 pg (ref 26.0–34.0)
MCHC: 31 g/dL (ref 30.0–36.0)
MCV: 89.3 fL (ref 80.0–100.0)
Monocytes Absolute: 0.5 10*3/uL (ref 0.1–1.0)
Monocytes Relative: 5 %
Neutro Abs: 7.9 10*3/uL — ABNORMAL HIGH (ref 1.7–7.7)
Neutrophils Relative %: 82 %
Platelets: 219 10*3/uL (ref 150–400)
RBC: 2.71 MIL/uL — ABNORMAL LOW (ref 3.87–5.11)
RDW: 17.4 % — ABNORMAL HIGH (ref 11.5–15.5)
WBC: 9.6 10*3/uL (ref 4.0–10.5)
nRBC: 0 % (ref 0.0–0.2)

## 2020-04-09 LAB — GLUCOSE, CAPILLARY
Glucose-Capillary: 100 mg/dL — ABNORMAL HIGH (ref 70–99)
Glucose-Capillary: 100 mg/dL — ABNORMAL HIGH (ref 70–99)
Glucose-Capillary: 119 mg/dL — ABNORMAL HIGH (ref 70–99)
Glucose-Capillary: 159 mg/dL — ABNORMAL HIGH (ref 70–99)
Glucose-Capillary: 80 mg/dL (ref 70–99)
Glucose-Capillary: 97 mg/dL (ref 70–99)
Glucose-Capillary: 99 mg/dL (ref 70–99)

## 2020-04-09 LAB — COMPREHENSIVE METABOLIC PANEL
ALT: <5 U/L (ref 0–44)
AST: 17 U/L (ref 15–41)
Albumin: 1.5 g/dL — ABNORMAL LOW (ref 3.5–5.0)
Alkaline Phosphatase: 73 U/L (ref 38–126)
Anion gap: 13 (ref 5–15)
BUN: 53 mg/dL — ABNORMAL HIGH (ref 8–23)
CO2: 16 mmol/L — ABNORMAL LOW (ref 22–32)
Calcium: 9.5 mg/dL (ref 8.9–10.3)
Chloride: 112 mmol/L — ABNORMAL HIGH (ref 98–111)
Creatinine, Ser: 2.33 mg/dL — ABNORMAL HIGH (ref 0.44–1.00)
GFR, Estimated: 23 mL/min — ABNORMAL LOW (ref >60)
Glucose, Bld: 110 mg/dL — ABNORMAL HIGH (ref 70–99)
Potassium: 3.4 mmol/L — ABNORMAL LOW (ref 3.5–5.1)
Sodium: 141 mmol/L (ref 135–145)
Total Bilirubin: 0.6 mg/dL (ref 0.3–1.2)
Total Protein: 6 g/dL — ABNORMAL LOW (ref 6.5–8.1)

## 2020-04-09 LAB — HAPTOGLOBIN: Haptoglobin: 283 mg/dL (ref 37–355)

## 2020-04-09 MED ORDER — BOOST / RESOURCE BREEZE PO LIQD CUSTOM
1.0000 | Freq: Three times a day (TID) | ORAL | Status: DC
  Administered 2020-04-12 – 2020-04-13 (×4): 1 via ORAL

## 2020-04-09 MED ORDER — PEG-KCL-NACL-NASULF-NA ASC-C 100 G PO SOLR: 1.0000 | Freq: Once | ORAL | Status: DC

## 2020-04-09 MED ORDER — FLUCONAZOLE 100 MG PO TABS
100.0000 mg | ORAL_TABLET | Freq: Every day | ORAL | Status: DC
  Administered 2020-04-09 – 2020-04-16 (×6): 100 mg via ORAL
  Filled 2020-04-09 (×8): qty 1

## 2020-04-09 MED ORDER — PEG-KCL-NACL-NASULF-NA ASC-C 100 G PO SOLR
0.5000 | Freq: Once | ORAL | Status: AC
  Administered 2020-04-09: 100 g via ORAL
  Filled 2020-04-09: qty 1

## 2020-04-09 MED ORDER — BISACODYL 5 MG PO TBEC
20.0000 mg | DELAYED_RELEASE_TABLET | Freq: Once | ORAL | Status: AC
  Administered 2020-04-09: 20 mg via ORAL
  Filled 2020-04-09: qty 4

## 2020-04-09 MED ORDER — PROSOURCE PLUS PO LIQD
30.0000 mL | Freq: Three times a day (TID) | ORAL | Status: DC
  Administered 2020-04-12 (×3): 30 mL via ORAL
  Filled 2020-04-09 (×12): qty 30

## 2020-04-09 MED ORDER — PEG-KCL-NACL-NASULF-NA ASC-C 100 G PO SOLR
0.5000 | Freq: Once | ORAL | Status: AC
  Administered 2020-04-10: 100 g via ORAL

## 2020-04-09 MED ORDER — MAGIC MOUTHWASH
2.0000 mL | Freq: Four times a day (QID) | ORAL | Status: DC
  Administered 2020-04-09 (×3): 2 mL via ORAL
  Filled 2020-04-09 (×5): qty 5

## 2020-04-09 NOTE — Plan of Care (Signed)
  Problem: Clinical Measurements: Goal: Will remain free from infection Outcome: Progressing Goal: Respiratory complications will improve Outcome: Progressing   Problem: Nutrition: Goal: Adequate nutrition will be maintained Outcome: Progressing   Problem: Coping: Goal: Level of anxiety will decrease Outcome: Progressing   Problem: Elimination: Goal: Will not experience complications related to bowel motility Outcome: Progressing Goal: Will not experience complications related to urinary retention Outcome: Progressing

## 2020-04-09 NOTE — Progress Notes (Signed)
   Reviewed with the patient and her nurse tech. Stool remains brown and semisolid.   Endoscopy rescheduled until 04/06/2020 given her unsuccessful with bowel prep. Will continue the purgative tonight with addition of Dulcolax. NPO at midnight.    Emilija Bohman L. Tarri Glenn, MD, MPH 04/09/2020, 2:26 PM

## 2020-04-09 NOTE — Progress Notes (Signed)
PT Cancellation Note  Patient Details Name: Deborah Figueroa MRN: [id number] DOB: Feb 21, 1956   Cancelled Treatment:    Reason Eval/Treat Not Completed: Patient at procedure or test/unavailable (pt meeting with dietician. Will follow.)   Philomena Doheny PT 04/09/2020  Acute Rehabilitation Services Pager [phone] Office [phone]

## 2020-04-09 NOTE — Progress Notes (Signed)
Initial Nutrition Assessment  DOCUMENTATION CODES:   Not applicable  INTERVENTION:   -Boost Breeze TID (8 fl oz, each supplement provides 250 kcal, 9 g protein) -ProSource Plus TID (30 mL, each supplement provides 100 kcal, 15 g protein) -reweigh pt today  NUTRITION DIAGNOSIS:   Increased nutrient needs related to acute illness (sepsis) as evidenced by estimated needs.  GOAL:   Patient will meet greater than or equal to 90% of their needs  MONITOR:   PO intake,Supplement acceptance,Labs,Weight trends,Skin  REASON FOR ASSESSMENT:   LOS    ASSESSMENT:   87 YOF presented to ED with weakness and after a fall. Pt admitted for DKA, hypercalcemia, and sepsis in the setting of MSSA bacteremia, left olecranon bursitis and osteomyelitis. Pt has also developed acute respiratory failure d/t hospital-acquired pneumonia as well as thrush and a right lung mass was discovered. PMH of DM-2, chronic hypercalcemia d/t hypothyroidism, HTN, HLD and recurrent falls.  Pt is currently experiencing homelessness and does not have insurance, therefore cannot be discharged home due to IV antibiotics regimen for 6 weeks.  Pt discussed with intern that she has had a poor appetite since being in the hospital. She indicated that food has made her feel nauseated and that she is currently preferring to only consume liquids. Pt has been predominantly on a CLD or on NPO, however on 01/25 pt was on full liquid diet during dinner and consumed 25% of meal.  Pt described that she has consistent access to food and typically gets her food from the grocery store. Pt indicated that she typically eats 3 meals a day and usually a snack. Pt discussed with intern that her meals typically consist of a meat, grain and vegetables while she usually snacks on a banana.   Pt denied any chewing or swallowing difficulties other than problems associated with thrush that pt mentioned has recently caused her to experience pain  occasionally with food or beverage consumption. She also experiences sensitivity to cold food and beverages. Intern discussed with pt that cool beverages rather than cold beverages may help alleviate thrush pain as well as her sensitivity.   Pt denied any taste changes, diarrhea or abdominal pain. Pt did mention that she has been experiencing nausea often, however did not explain that there was a specific cause to this. Pt also mentioned that she was experiencing some vomiting, but again did not know what the specific cause of this symptom.   Intern suggested that pt start taking ONS Boost Breeze to aid in her healing process. Pt indicated that she had not tried [employer] PTA, but would be willing to try it.   Pt denied any changes in the fitting of her clothing PTA, however pt did mention that she felt she had lost weight during her hospital stay. Pt's notes indicate last weight was 93.9 kg on 03/25/2020. Pt's weight had increased by 5% in one month.   Pt indicated that she was very mobile PTA and mentioned that she walks quite a bit daily.   Meds Reviewed: Dulcolax (20 mg, given once), Colace (100 mg, BID), Miralax (17 g, daily)  Labs Reviewed: glucose (100 mg/dL), potassium (3.4 mmol/L), chloride (112 mmol/L), CO2 (16 mmol/L), BUN (53 mg/dL), creatinine (2.33 mg/dL)  NUTRITION - FOCUSED PHYSICAL EXAM:  Flowsheet Row Most Recent Value  Orbital Region No depletion  Upper Arm Region No depletion  Thoracic and Lumbar Region No depletion  Buccal Region No depletion  Temple Region No depletion  Clavicle Bone Region  No depletion  Clavicle and Acromion Bone Region No depletion  Scapular Bone Region No depletion  Dorsal Hand No depletion  Patellar Region No depletion  Anterior Thigh Region No depletion  Posterior Calf Region No depletion  Edema (RD Assessment) Moderate  Hair Reviewed  Eyes Reviewed  Mouth Reviewed  Skin Reviewed  Nails Reviewed       Diet Order:   Diet Order             Diet NPO time specified Except for: Sips with Meds  Diet effective midnight           Diet clear liquid Room service appropriate? Yes; Fluid consistency: Thin  Diet effective now                 EDUCATION NEEDS:   No education needs have been identified at this time  Skin:  Skin Assessment: Skin Integrity Issues: Skin Integrity Issues:: Incisions Incisions: open incision/wound on buttocks (bilateral, mid), close incision on left arm  Last BM:  04/09/20 (type 7)  Height:   Ht Readings from Last 1 Encounters:  03/26/2020 5\' 11"  (1.803 m)    Weight:   Wt Readings from Last 1 Encounters:  03/16/2020 93.9 kg    Ideal Body Weight:  71 kg  BMI:  Body mass index is 28.87 kg/m.  Estimated Nutritional Needs:   Kcal:  2400-2600  Protein:  115-130  Fluid:  >2.4 L    Salvadore Oxford, Dietetic Intern 04/09/2020 4:05 PM

## 2020-04-09 NOTE — Anesthesia Preprocedure Evaluation (Addendum)
Anesthesia Evaluation  Patient identified by MRN, date of birth, ID band Patient awake    Reviewed: Allergy & Precautions, NPO status , Patient's Chart, lab work & pertinent test results  History of Anesthesia Complications Negative for: history of anesthetic complications  Airway Mallampati: II  TM Distance: >3 FB Neck ROM: Full    Dental  (+) Missing,    Pulmonary pneumonia,   R lung mass    Pulmonary exam normal        Cardiovascular hypertension, Pt. on medications and Pt. on home beta blockers + Peripheral Vascular Disease  Normal cardiovascular exam     Neuro/Psych PSYCHIATRIC DISORDERS Depression negative neurological ROS     GI/Hepatic negative GI ROS, Neg liver ROS,   Endo/Other  diabetes, Poorly Controlled, Insulin DependentK 2.9 Cl 113   Renal/GU CRFRenal disease     Musculoskeletal negative musculoskeletal ROS (+)   Abdominal   Peds  Hematology  (+) anemia , Hgb 7.1 INR 1.4     Anesthesia Other Findings Covid test negative 03/28/2020 - inpatient since    Reproductive/Obstetrics                         Anesthesia Physical Anesthesia Plan  ASA: IV  Anesthesia Plan: MAC   Post-op Pain Management:    Induction: Intravenous  PONV Risk Score and Plan: 2 and Treatment may vary due to age or medical condition and Propofol infusion  Airway Management Planned: Nasal Cannula and Natural Airway  Additional Equipment: None  Intra-op Plan:   Post-operative Plan:   Informed Consent: I have reviewed the patients History and Physical, chart, labs and discussed the procedure including the risks, benefits and alternatives for the proposed anesthesia with the patient or authorized representative who has indicated his/her understanding and acceptance.     Dental advisory given  Plan Discussed with: CRNA and Anesthesiologist  Anesthesia Plan Comments:       Anesthesia  Quick Evaluation

## 2020-04-09 NOTE — TOC Progression Note (Signed)
Transition of Care [facility]) - Progression Note    Patient Details  Name: Deborah Figueroa MRN: [id number] Date of Birth: 09/11/1955  Transition of Care [facility]) CM/SW Contact  Ross Ludwig, [place] Phone Number: 04/09/2020, 5:24 PM  Clinical Narrative:     Patient is homeless and does not have any insurance.  Patient does not have a SNF that is able to accept her for long term IV antibiotics.   Expected Discharge Plan: Skilled Nursing Facility Barriers to Discharge: Continued Medical Work up  Expected Discharge Plan and Services Expected Discharge Plan: [place]   Discharge Planning Services: CM Consult                                           Social Determinants of Health (SDOH) Interventions    Readmission Risk Interventions No flowsheet data found.

## 2020-04-09 NOTE — Progress Notes (Signed)
PROGRESS NOTE   Deborah Figueroa  HCW:[id number] DOB: March 07, 1956 DOA: 04/05/2020 PCP: Ann Held, DO  Brief Narrative:  65 year old white female DM TY 2 uncontrolled Chronic hypercalcemia 2/2 hypothyroidism Depression HTN HLD Admitted 03/22/2019 with DKA Found to have sepsis in setting of MSSA bacteremia left olecranon bursitis/osteomyelitis Orthopedics consulted underwent elbow I&D 1/10 infectious disease saw the patient recommended IV antibiotics for 6 weeks Patient developed acute respiratory failure due to hospital-acquired pneumonia as well as blood loss from epistaxis and positive Hemoccult   Assessment & Plan:   Principal Problem:   MSSA bacteremia Active Problems:   Hypercalcemia   Depression, major, single episode, moderate (HCC)   DKA (diabetic ketoacidosis) ([place])   Acute pain of right shoulder   Left elbow pain   Acute kidney injury ([place])   Weakness   Recurrent falls   Sepsis due to methicillin susceptible Staphylococcus aureus ([place])   Osteomyelitis of left elbow ([place])   Osteomyelitis of left ulna (HCC)   Sepsis ([place])   Blood in the stool   Anemia due to GI blood loss   Coagulopathy ([place])   1. MSSA bacteremia chronic olecranon bursitis and phlegmon a. Stop date for cefazolin appears to be through 05/05/2020-TEE negative for endocarditis-repeat blood culture 1/11  b. Per ID needs minimum 4 weeks IV prior to consideration for change to oral. 2. Acute metabolic encephalopathy a. Seems to have resolved-MRI brain this admission negative 3. Oropharyngeal thrush a. Add fluconazole and magic mw--complete therapy in 7-10 d 4. Hypoxemic respiratory failure secondary to HCAP a. Desat to 87% previously b. Treated with broad-spectrum antibiotics and now just treating as above for MSSA 5. Right apical mass a. Outpatient further characterization b. We will need to be set up once discharged 6. 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 Metabolic  acidosis a. Baseline creatinine prior to this admit about 1.5 back in 2018 b. Avoid nephrotoxins c. Continue bicarb 3 times daily 8. Diabetes mellitus with complications of DKA on admit-currently hypoglycemic a. DKA physiology resolved b. cut back sliding scale coverage from 5 to 3 units 3 times daily meals 9. Severe hypercalcemia a. Likely secondary to DKA and normalized 10. ?  Rectovaginal fistula a. Outpatient characterization 11. Acute on chronic normocytic anemia 12. Epistaxis coagulopathy a. Stable at this time b. Planning for endoscopy colonoscopy given reports of dark stool/bloody stool c. not ready for scope today as stool still solid 13. Superficial DVT right cephalic vein a. Not a candidate for anticoagulation 14. Depression a. Continue Zoloft 100 daily  DVT prophylaxis: Lovenox Code Status: Full Family Communication: None present Disposition:  Status is: Inpatient  Remains inpatient appropriate because:Hemodynamically unstable, Ongoing diagnostic testing needed not appropriate for outpatient work up, Unsafe d/c plan and IV treatments appropriate due to intensity of illness or inability to take PO   Dispo: The patient is from: Home              Anticipated d/c is to: Unknown              Anticipated d/c date is: 3 days              Patient currently is not medically stable to d/c.   Difficult to place patient No       Consultants:   ID  GI    Procedures:   Antimicrobials: Currently cefazolin   Subjective:  Tired of bowel prep No fever chills  Objective: Vitals:   04/08/20 1340 04/08/20  2056 04/09/20 0540 04/09/20 1300  BP: (!) 151/82 (!) 150/84 (!) 153/89 (!) 151/89  Pulse: 98 (!) 105 86 80  Resp: '18 20 20 20  ' Temp: 98.4 F (36.9 C) 98.4 F (36.9 C) 98.2 F (36.8 C) 97.9 F (36.6 C)  TempSrc: Oral Oral Oral Oral  SpO2: 99% 97% 99% 98%  Weight:      Height:        Intake/Output Summary (Last 24 hours) at 04/09/2020 1451 Last data  filed at 04/09/2020 0600 Gross per 24 hour  Intake 1679.86 ml  Output 350 ml  Net 1329.86 ml   Filed Weights   03/20/2020 0952  Weight: 93.9 kg    Examination: EOMI NCAT no focal deficit Thick neck anicteric no pallor CTA B no added sound no rales no rhonchi Abdomen soft nontender Neurologically intact moving all 4 limbs equally Grade 1 lower extremity edema   Data Reviewed: I have personally reviewed following labs and imaging studies  Sodium 141 potassium 3.4 BUN/creatinine 61/2.4-->53/2.3 Hemoglobin 7.5 white count 9.6  COVID-19 Labs  No results for input(s): DDIMER, FERRITIN, LDH, CRP in the last 72 hours.  Lab Results  Component Value Date   [place] NEGATIVE 03/29/2020     Radiology Studies: No results found.   Scheduled Meds: . atorvastatin  40 mg Oral Daily  . bisacodyl  20 mg Oral Once  . docusate sodium  100 mg Oral BID  . fenofibrate  160 mg Oral Daily  . fluconazole  100 mg Oral Daily  . insulin aspart  0-15 Units Subcutaneous Q4H  . insulin aspart  3 Units Subcutaneous TID WC  . insulin glargine  9 Units Subcutaneous QHS  . magic mouthwash  2 mL Oral QID  . melatonin  3 mg Oral QHS  . metoprolol succinate  100 mg Oral Daily  . nystatin   Topical TID  . peg 3350 powder  0.5 kit Oral Once   And  . [START ON 03/16/2020] peg 3350 powder  0.5 kit Oral Once  . polyethylene glycol  17 g Oral Daily  . sertraline  100 mg Oral Daily  . sodium bicarbonate  650 mg Oral TID   Continuous Infusions: .  ceFAZolin (ANCEF) IV Stopped (04/09/20 0540)     LOS: 19 days    Time spent: 25  Nita Sells, MD [facility] To contact the attending provider between 7A-7P or the covering provider during after hours 7P-7A, please log into the web site [email or web address] and access using universal [place] password for that web site. If you do not have the password, please call the hospital operator.  04/09/2020, 2:51 PM

## 2020-04-10 ENCOUNTER — Encounter (HOSPITAL_COMMUNITY): Payer: Self-pay | Admitting: Internal Medicine

## 2020-04-10 ENCOUNTER — Encounter (HOSPITAL_COMMUNITY): Admission: EM | Disposition: E | Payer: Self-pay | Source: Home / Self Care | Attending: Student

## 2020-04-10 ENCOUNTER — Inpatient Hospital Stay (HOSPITAL_COMMUNITY): Payer: Medicaid Other | Admitting: Anesthesiology

## 2020-04-10 DIAGNOSIS — K635 Polyp of colon: Secondary | ICD-10-CM

## 2020-04-10 DIAGNOSIS — K5641 Fecal impaction: Secondary | ICD-10-CM

## 2020-04-10 DIAGNOSIS — K626 Ulcer of anus and rectum: Secondary | ICD-10-CM

## 2020-04-10 DIAGNOSIS — D122 Benign neoplasm of ascending colon: Secondary | ICD-10-CM

## 2020-04-10 DIAGNOSIS — K253 Acute gastric ulcer without hemorrhage or perforation: Secondary | ICD-10-CM

## 2020-04-10 DIAGNOSIS — K259 Gastric ulcer, unspecified as acute or chronic, without hemorrhage or perforation: Secondary | ICD-10-CM

## 2020-04-10 DIAGNOSIS — K269 Duodenal ulcer, unspecified as acute or chronic, without hemorrhage or perforation: Secondary | ICD-10-CM

## 2020-04-10 DIAGNOSIS — D124 Benign neoplasm of descending colon: Secondary | ICD-10-CM

## 2020-04-10 HISTORY — PX: ESOPHAGOGASTRODUODENOSCOPY (EGD) WITH PROPOFOL: SHX5813

## 2020-04-10 HISTORY — PX: POLYPECTOMY: SHX5525

## 2020-04-10 HISTORY — PX: BIOPSY: SHX5522

## 2020-04-10 HISTORY — PX: COLONOSCOPY WITH PROPOFOL: SHX5780

## 2020-04-10 LAB — COMPREHENSIVE METABOLIC PANEL
ALT: <5 U/L (ref 0–44)
AST: 13 U/L — ABNORMAL LOW (ref 15–41)
Albumin: 1.5 g/dL — ABNORMAL LOW (ref 3.5–5.0)
Alkaline Phosphatase: 69 U/L (ref 38–126)
Anion gap: 15 (ref 5–15)
BUN: 52 mg/dL — ABNORMAL HIGH (ref 8–23)
CO2: 15 mmol/L — ABNORMAL LOW (ref 22–32)
Calcium: 9.4 mg/dL (ref 8.9–10.3)
Chloride: 113 mmol/L — ABNORMAL HIGH (ref 98–111)
Creatinine, Ser: 2.18 mg/dL — ABNORMAL HIGH (ref 0.44–1.00)
GFR, Estimated: 25 mL/min — ABNORMAL LOW (ref >60)
Glucose, Bld: 135 mg/dL — ABNORMAL HIGH (ref 70–99)
Potassium: 2.6 mmol/L — CL (ref 3.5–5.1)
Sodium: 143 mmol/L (ref 135–145)
Total Bilirubin: 0.5 mg/dL (ref 0.3–1.2)
Total Protein: 5.6 g/dL — ABNORMAL LOW (ref 6.5–8.1)

## 2020-04-10 LAB — GLUCOSE, CAPILLARY
Glucose-Capillary: 120 mg/dL — ABNORMAL HIGH (ref 70–99)
Glucose-Capillary: 121 mg/dL — ABNORMAL HIGH (ref 70–99)
Glucose-Capillary: 135 mg/dL — ABNORMAL HIGH (ref 70–99)
Glucose-Capillary: 136 mg/dL — ABNORMAL HIGH (ref 70–99)
Glucose-Capillary: 138 mg/dL — ABNORMAL HIGH (ref 70–99)
Glucose-Capillary: 142 mg/dL — ABNORMAL HIGH (ref 70–99)

## 2020-04-10 LAB — CBC WITH DIFFERENTIAL/PLATELET
Abs Immature Granulocytes: 0.68 10*3/uL — ABNORMAL HIGH (ref 0.00–0.07)
Basophils Absolute: 0 10*3/uL (ref 0.0–0.1)
Basophils Relative: 0 %
Eosinophils Absolute: 0.1 10*3/uL (ref 0.0–0.5)
Eosinophils Relative: 1 %
HCT: 23.3 % — ABNORMAL LOW (ref 36.0–46.0)
Hemoglobin: 7.1 g/dL — ABNORMAL LOW (ref 12.0–15.0)
Immature Granulocytes: 7 %
Lymphocytes Relative: 4 %
Lymphs Abs: 0.4 10*3/uL — ABNORMAL LOW (ref 0.7–4.0)
MCH: 27.4 pg (ref 26.0–34.0)
MCHC: 30.5 g/dL (ref 30.0–36.0)
MCV: 90 fL (ref 80.0–100.0)
Monocytes Absolute: 0.4 10*3/uL (ref 0.1–1.0)
Monocytes Relative: 4 %
Neutro Abs: 7.7 10*3/uL (ref 1.7–7.7)
Neutrophils Relative %: 84 %
Platelets: 217 10*3/uL (ref 150–400)
RBC: 2.59 MIL/uL — ABNORMAL LOW (ref 3.87–5.11)
RDW: 17.5 % — ABNORMAL HIGH (ref 11.5–15.5)
WBC: 9.3 10*3/uL (ref 4.0–10.5)
nRBC: 0 % (ref 0.0–0.2)

## 2020-04-10 LAB — FACTOR 2 ASSAY: Factor II Activity: 83 % (ref 50–154)

## 2020-04-10 LAB — POTASSIUM: Potassium: 2.9 mmol/L — ABNORMAL LOW (ref 3.5–5.1)

## 2020-04-10 LAB — PROTIME-INR
INR: 1.3 — ABNORMAL HIGH (ref 0.8–1.2)
Prothrombin Time: 15.2 seconds (ref 11.4–15.2)

## 2020-04-10 LAB — FACTOR 10 ASSAY: Factor X Activity: 72 % — ABNORMAL LOW (ref 76–183)

## 2020-04-10 LAB — APTT: aPTT: 53 seconds — ABNORMAL HIGH (ref 24–36)

## 2020-04-10 SURGERY — ESOPHAGOGASTRODUODENOSCOPY (EGD) WITH PROPOFOL
Anesthesia: Monitor Anesthesia Care

## 2020-04-10 MED ORDER — PROPOFOL 10 MG/ML IV BOLUS
INTRAVENOUS | Status: DC | PRN
  Administered 2020-04-10: 20 mg via INTRAVENOUS

## 2020-04-10 MED ORDER — CEFAZOLIN SODIUM-DEXTROSE 2-4 GM/100ML-% IV SOLN
2.0000 g | Freq: Three times a day (TID) | INTRAVENOUS | Status: DC
  Administered 2020-04-10 – 2020-04-19 (×26): 2 g via INTRAVENOUS
  Filled 2020-04-10 (×28): qty 100

## 2020-04-10 MED ORDER — LIDOCAINE 2% (20 MG/ML) 5 ML SYRINGE
INTRAMUSCULAR | Status: DC | PRN
  Administered 2020-04-10: 100 mg via INTRAVENOUS

## 2020-04-10 MED ORDER — POTASSIUM CHLORIDE CRYS ER 20 MEQ PO TBCR
40.0000 meq | EXTENDED_RELEASE_TABLET | Freq: Three times a day (TID) | ORAL | Status: DC
  Administered 2020-04-10 – 2020-04-12 (×7): 40 meq via ORAL
  Filled 2020-04-10 (×8): qty 2

## 2020-04-10 MED ORDER — PROPOFOL 500 MG/50ML IV EMUL
INTRAVENOUS | Status: DC | PRN
  Administered 2020-04-10: 200 ug/kg/min via INTRAVENOUS

## 2020-04-10 MED ORDER — POTASSIUM CHLORIDE 10 MEQ/100ML IV SOLN
10.0000 meq | INTRAVENOUS | Status: AC
  Administered 2020-04-10 (×3): 10 meq via INTRAVENOUS
  Filled 2020-04-10 (×4): qty 100

## 2020-04-10 MED ORDER — PANTOPRAZOLE SODIUM 40 MG IV SOLR
40.0000 mg | Freq: Two times a day (BID) | INTRAVENOUS | Status: DC
  Administered 2020-04-10 – 2020-04-11 (×3): 40 mg via INTRAVENOUS
  Filled 2020-04-10 (×5): qty 40

## 2020-04-10 MED ORDER — NYSTATIN 100000 UNIT/ML MT SUSP
5.0000 mL | Freq: Four times a day (QID) | OROMUCOSAL | Status: DC
  Administered 2020-04-10 – 2020-04-22 (×25): 500000 [IU] via ORAL
  Filled 2020-04-10 (×35): qty 5

## 2020-04-10 MED ORDER — LACTATED RINGERS IV SOLN: INTRAVENOUS | Status: DC | PRN

## 2020-04-10 MED ORDER — ONDANSETRON HCL 4 MG/2ML IJ SOLN
INTRAMUSCULAR | Status: AC
  Filled 2020-04-10: qty 2

## 2020-04-10 SURGICAL SUPPLY — 25 items

## 2020-04-10 NOTE — Progress Notes (Signed)
OT Cancellation Note  Patient Details Name: Deborah Figueroa MRN: [id number] DOB: 03/01/1956   Cancelled Treatment:    Reason Eval/Treat Not Completed: Other (comment) Patient still receiving bowel prep and needing K+, scheduled for colonoscopy at 1400 today. Will check back at later date.   Delbert Phenix OT OT pager: [phone]   Rosemary Holms 04/06/2020, 12:53 PM

## 2020-04-10 NOTE — Progress Notes (Signed)
PT Cancellation Note  Patient Details Name: Deborah Figueroa MRN: [id number] DOB: [birth year]   Cancelled Treatment:    Reason Eval/Treat Not Completed: Other (comment)   Patient still receiving bowel prep and needing K+, scheduled for colonoscopy at 1400 today. Will check back at later date.  Abran Richard, PT Acute Rehab Services Pager [phone] [facility] [place] 03/25/2020, 2:00 PM

## 2020-04-10 NOTE — Op Note (Signed)
[facility] Patient Name: Deborah Figueroa Procedure Date: 03/28/2020 MRN: [id number] Attending MD: Thornton Park MD, MD Date of Birth: [birth year] CSN: [id number] Age: 65 Admit Type: Inpatient Procedure:                Colonoscopy Indications:              Iron deficiency anemia secondary to chronic blood                            loss Providers:                Thornton Park MD, MD, Cleda Daub, RN,                            Nelia Shi, RN, Tyna Jaksch Technician Referring MD:              Medicines:                Monitored Anesthesia Care Complications:            No immediate complications. Estimated blood loss:                            Minimal. Estimated Blood Loss:     Estimated blood loss was minimal. Procedure:                Pre-Anesthesia Assessment:                           - Prior to the procedure, a History and Physical                            was performed, and patient medications and                            allergies were reviewed. The patient's tolerance of                            previous anesthesia was also reviewed. The risks                            and benefits of the procedure and the sedation                            options and risks were discussed with the patient.                            All questions were answered, and informed consent                            was obtained. Prior Anticoagulants: The patient has                            taken no previous anticoagulant or antiplatelet                            agents. ASA Grade Assessment:  III - A patient with                            severe systemic disease. After reviewing the risks                            and benefits, the patient was deemed in                            satisfactory condition to undergo the procedure.                           After obtaining informed consent, the colonoscope                            was passed under direct  vision. Throughout the                            procedure, the patient's blood pressure, pulse, and                            oxygen saturations were monitored continuously. The                            CF-HQ190L ([id number] Olympus colonoscope was                            introduced through the anus and advanced to the the                            cecum, identified by appendiceal orifice and                            ileocecal valve. The colonoscopy was performed with                            moderate difficulty due to poor endoscopic                            visualization and a redundant colon. The patient                            tolerated the procedure well. The quality of the                            bowel preparation was adequate. The ileocecal                            valve, appendiceal orifice, and rectum were                            photographed. Scope In: 2:54:40 PM Scope Out: 3:13:26 PM Scope Withdrawal Time: 0 hours 12 minutes 33 seconds  Total Procedure Duration: 0 hours 18 minutes 46 seconds  Findings:  The digital rectal exam was abnormal. Fecal impaction present in the       rectal vault.      A large amount of solid stool was found in the rectum, precluding       visualization. Manual disimpaction performed      A single (solitary) 8-10 mm ulcer was found in the rectum. No bleeding       was present.      A 2 mm polyp was found in the descending colon. The polyp was sessile.       The polyp was removed with a cold snare. Resection and retrieval were       complete. Estimated blood loss was minimal.      A 3 mm polyp was found in the ascending colon. The polyp was sessile.       The polyp was removed with a cold snare. Resection and retrieval were       complete. Estimated blood loss was minimal.      The exam was otherwise without abnormality on direct and retroflexion       views. Impression:               - Fecal impaction with associated  sterocoral ulcer.                            Manual disimpaction performed today.                           - One 2 mm polyp in the descending colon, removed                            with a cold snare. Resected and retrieved.                           - One 3 mm polyp in the ascending colon, removed                            with a cold snare. Resected and retrieved.                           - The examination was otherwise normal on direct                            and retroflexion views. Moderate Sedation:      Not Applicable - Patient had care per Anesthesia. Recommendation:           - Advance diet as tolerated.                           - Continue present medications.                           - Agressive bowel regimen to keep stools soft and                            prevent recurrence of fecal impaction.                           -  Consider Carafate enemas x 6 weeks to treat the                            rectal ulcer.                           - Repeat colonoscopy in 1 year for surveillance.                            The retained stool throughout the colon prevents                            this from being an adequate screening colonoscopy. Procedure Code(s):        --- Professional ---                           [phone], Colonoscopy, flexible; with removal of                            tumor(s), polyp(s), or other lesion(s) by snare                            technique Diagnosis Code(s):        --- Professional ---                           K62.89, Other specified diseases of anus and rectum                           K62.6, Ulcer of anus and rectum                           K63.5, Polyp of colon                           D50.0, Iron deficiency anemia secondary to blood                            loss (chronic) CPT copyright 2019 American Medical Association. All rights reserved. The codes documented in this report are preliminary and upon coder review may  be revised to  meet current compliance requirements. Thornton Park MD, MD 04/07/2020 3:35:08 PM This report has been signed electronically. Number of Addenda: 0

## 2020-04-10 NOTE — NC FL2 (Signed)
[place] LEVEL OF CARE SCREENING TOOL     IDENTIFICATION  Patient Name: Deborah Figueroa Birthdate: 1956-02-28 Sex: female Admission Date (Current Location): 03/23/2020  [facility] and [place] Number:  Psychologist, counselling and Address:  [facility],  [place] [address] [place]      Provider Number: [id number]  Attending Physician Name and Address:  Nita Sells, MD  Relative Name and Phone Number:  Cleta, Heatley [phone]    Aideliz, Garmany Daughter [phone]  [phone]    Current Level of Care: Hospital Recommended Level of Care: [place] Prior Approval Number:    Date Approved/Denied:   PASRR Number: [id number]  Discharge Plan: SNF    Current Diagnoses: Patient Active Problem List   Diagnosis Date Noted  . Blood in the stool   . Anemia due to GI blood loss   . Coagulopathy ([place])   . Sepsis ([place]) 04/13/2020  . Olecranon bursitis of left elbow 03/22/2020  . Head trauma, initial encounter 03/22/2020  . Acute pain of right shoulder 03/22/2020  . Left elbow pain 03/22/2020  . MSSA bacteremia 03/22/2020  . Acute kidney injury ([place]) 03/22/2020  . Weakness 03/22/2020  . Recurrent falls 03/22/2020  . Sepsis due to methicillin susceptible Staphylococcus aureus ([place]) 03/22/2020  . 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 DKA (diabetic ketoacidosis) ([place]) 03/21/2020  . DM (diabetes mellitus) type II uncontrolled, periph vascular disorder ([place]) 01/12/2018  . Daytime somnolence 10/02/2017  . Diabetes mellitus type II, uncontrolled ([place]) 08/12/2016  . Toenail fungus 07/31/2012  . Hypercalcemia 04/10/2009  . Hyperlipidemia 02/06/2008  . Depression, major, single episode, moderate ([place]) 02/06/2008  . Essential hypertension 02/06/2008    Orientation RESPIRATION BLADDER Height & Weight     Self,Situation,Time,Place  Normal Continent  Weight: 207 lb (93.9 kg) (taken from previous charting) Height:  5\' 11"  (180.3 cm)  BEHAVIORAL SYMPTOMS/MOOD NEUROLOGICAL BOWEL NUTRITION STATUS      Incontinent Diet (Regular)  AMBULATORY STATUS COMMUNICATION OF NEEDS Skin   Limited Assist Verbally Surgical wounds                       Personal Care Assistance Level of Assistance  Feeding,Dressing,Bathing Bathing Assistance: Limited assistance Feeding assistance: Independent Dressing Assistance: Limited assistance     Functional Limitations Info  Sight,Hearing,Speech Sight Info: Adequate Hearing Info: Adequate Speech Info: Adequate    SPECIAL CARE FACTORS FREQUENCY  PT (By licensed PT),OT (By licensed OT)     PT Frequency: Minimum 5x a week OT Frequency: Minimum 5x a week            Contractures Contractures Info: Not present    Additional Factors Info  Code Status,Allergies,Psychotropic,Insulin Sliding Scale Code Status Info: Full Code Allergies Info: NKA Psychotropic Info: sertraline (ZOLOFT) tablet 100 mg Insulin Sliding Scale Info: insulin aspart (novoLOG) injection 3 Units 3x a day with meals Isolation Precautions Info:  (MSSA Bacteremia)     Current Medications (04/02/2020):  This is the current hospital active medication list Current Facility-Administered Medications  Medication Dose Route Frequency Provider Last Rate Last Admin  . (feeding supplement) PROSource Plus liquid 30 mL  30 mL Oral TID BM Samtani, Jai-Gurmukh, MD      . alum & mag hydroxide-simeth (MAALOX/MYLANTA) 200-200-20 MG/5ML suspension 30 mL  30 mL Oral Q6H PRN Cyndia Skeeters, Taye T, MD   30 mL at 04/06/20 1821  . atorvastatin (LIPITOR) tablet 40  mg  40 mg Oral Daily Buford Dresser, MD   40 mg at 04/06/20 0936  . bisacodyl (DULCOLAX) suppository 10 mg  10 mg Rectal Daily PRN Hollace Hayward K, NP   10 mg at 04/07/20 1019  . ceFAZolin (ANCEF) IVPB 2g/100 mL premix  2 g Intravenous Q8H Samtani, Jai-Gurmukh, MD      . dextrose 50 % solution  0-50 mL  0-50 mL Intravenous PRN Buford Dresser, MD      . docusate sodium (COLACE) capsule 100 mg  100 mg Oral BID Buford Dresser, MD   100 mg at 04/07/20 2209  . feeding supplement (BOOST / RESOURCE BREEZE) liquid 1 Container  1 Container Oral TID BM Samtani, Jai-Gurmukh, MD      . fenofibrate tablet 160 mg  160 mg Oral Daily Buford Dresser, MD   160 mg at 04/06/20 0936  . fluconazole (DIFLUCAN) tablet 100 mg  100 mg Oral Daily Nita Sells, MD   100 mg at 04/09/20 1250  . insulin aspart (novoLOG) injection 0-15 Units  0-15 Units Subcutaneous Q4H Dessa Phi, DO   2 Units at 03/27/2020 0448  . insulin aspart (novoLOG) injection 3 Units  3 Units Subcutaneous TID WC Samtani, Jai-Gurmukh, MD      . insulin glargine (LANTUS) injection 9 Units  9 Units Subcutaneous QHS Samtani, Jai-Gurmukh, MD      . liver oil-zinc oxide (DESITIN) 40 % ointment   Topical PRN Mercy Riding, MD   1 application at 27/74/12 1430  . melatonin tablet 3 mg  3 mg Oral QHS Lang Snow, NP   3 mg at 04/07/20 2209  . metoprolol succinate (TOPROL-XL) 24 hr tablet 100 mg  100 mg Oral Daily Dessa Phi, DO   100 mg at 04/09/20 8786  . nystatin (MYCOSTATIN) 100000 UNIT/ML suspension 500,000 Units  5 mL Oral QID Nita Sells, MD      . nystatin (MYCOSTATIN/NYSTOP) topical powder   Topical TID Dessa Phi, DO   Given at 04/13/2020 1001  . ondansetron (ZOFRAN) tablet 4 mg  4 mg Oral Q6H PRN Buford Dresser, MD   4 mg at 03/31/20 1205   Or  . ondansetron (ZOFRAN) injection 4 mg  4 mg Intravenous Q6H PRN Buford Dresser, MD   4 mg at 04/05/2020 0007  . oxymetazoline (AFRIN) 0.05 % nasal spray 3 spray  3 spray Each Nare BID PRN Mercy Riding, MD   3 spray at 04/01/20 1800  . phenol (CHLORASEPTIC) mouth spray 1 spray  1 spray Mouth/Throat PRN Gonfa, Taye T, MD      . polyethylene glycol (MIRALAX / GLYCOLAX) packet 17 g  17 g Oral Daily Kyere, Belinda K, NP      .  potassium chloride 10 mEq in 100 mL IVPB  10 mEq Intravenous Q1 Hr x 4 Samtani, Jai-Gurmukh, MD 100 mL/hr at 04/02/2020 1237 10 mEq at 04/13/2020 1237  . potassium chloride SA (KLOR-CON) CR tablet 40 mEq  40 mEq Oral TID Nita Sells, MD   40 mEq at 04/06/2020 1016  . sertraline (ZOLOFT) tablet 100 mg  100 mg Oral Daily Dessa Phi, DO   100 mg at 04/06/20 0936  . sodium bicarbonate tablet 650 mg  650 mg Oral TID Mercy Riding, MD   650 mg at 04/07/20 2209     Discharge Medications: Please see discharge summary for a list of discharge medications.  Relevant Imaging Results:  Relevant Lab Results:   Additional Information SSN [id number]  Ross Ludwig, LCSW

## 2020-04-10 NOTE — Progress Notes (Signed)
Spoke with RN, patient's bowels still not clear. Passing liquid brown stool. She completed most but not all the prep. Will have her drink remaining prep now to be completed by 12 noon. I was able to move colonoscopy time from 1pm to 2 pm  Patient hypokalemic. Anesthesia wants K+ up to a minimum of 2.9 prior to procedure. K+ is 2.6 this am. Will arrange for IV K+ and repeat serum potassium at 1pm.

## 2020-04-10 NOTE — Transfer of Care (Signed)
Immediate Anesthesia Transfer of Care Note  Patient: Deborah Figueroa  Procedure(s) Performed: ESOPHAGOGASTRODUODENOSCOPY (EGD) WITH PROPOFOL (N/A ) COLONOSCOPY WITH PROPOFOL (N/A ) BIOPSY POLYPECTOMY  Patient Location: PACU  Anesthesia Type:MAC  Level of Consciousness: awake  Airway & Oxygen Therapy: Patient Spontanous Breathing and Patient connected to nasal cannula oxygen  Post-op Assessment: Report given to RN and Post -op Vital signs reviewed and stable  Post vital signs: Reviewed and stable  Last Vitals:  Vitals Value Taken Time  BP 131/62 03/18/2020 1524  Temp    Pulse 79 04/09/2020 1525  Resp 23 04/01/2020 1525  SpO2 96 % 04/09/2020 1525  Vitals shown include unvalidated device data.  Last Pain:  Vitals:   03/19/2020 1400  TempSrc: Oral  PainSc: 0-No pain      Patients Stated Pain Goal: 0 (74/93/55 2174)  Complications: No complications documented.

## 2020-04-10 NOTE — Op Note (Signed)
[facility] Patient Name: Deborah Figueroa Procedure Date: 04/05/2020 MRN: [id number] Attending MD: Thornton Park MD, MD Date of Birth: 12-18-1955 CSN: [id number] Age: 65 Admit Type: Inpatient Procedure:                Upper GI endoscopy Indications:              Acute post hemorrhagic anemia Providers:                Thornton Park MD, MD, Cleda Daub, RN,                            Nelia Shi, RN, Tyna Jaksch Technician Referring MD:              Medicines:                Monitored Anesthesia Care Complications:            No immediate complications. Estimated blood loss:                            Minimal. Estimated Blood Loss:     Estimated blood loss was minimal. Procedure:                Pre-Anesthesia Assessment:                           - Prior to the procedure, a History and Physical                            was performed, and patient medications and                            allergies were reviewed. The patient's tolerance of                            previous anesthesia was also reviewed. The risks                            and benefits of the procedure and the sedation                            options and risks were discussed with the patient.                            All questions were answered, and informed consent                            was obtained. Prior Anticoagulants: The patient has                            taken no previous anticoagulant or antiplatelet                            agents. ASA Grade Assessment: III - A patient with  severe systemic disease. After reviewing the risks                            and benefits, the patient was deemed in                            satisfactory condition to undergo the procedure.                           After obtaining informed consent, the endoscope was                            passed under direct vision. Throughout the                             procedure, the patient's blood pressure, pulse, and                            oxygen saturations were monitored continuously. The                            [id number]-[id number] ([id number]) Olympus gastroscope was                            introduced through the mouth, and advanced to the                            third part of duodenum. The upper GI endoscopy was                            accomplished without difficulty. The patient                            tolerated the procedure well. Scope In: Scope Out: Findings:      The examined esophagus showed a linear mid-esophageal ulcer. Biopsies       were obtained with cold forceps for histology. The z-line was located 40       cm from the incisors and appeared normal.      One non-bleeding superficial gastric ulcer with no stigmata of bleeding       was found in the gastric antrum. The lesion was 4 mm in largest       dimension. Biopsies were taken from the antrum, body, and fundus with a       cold forceps for histology. Estimated blood loss was minimal.      Multiple small erosions with no bleeding and no stigmata of recent       bleeding were found in the gastric antrum.      Three non-bleeding cratered duodenal ulcers with pigmented material were       found in the duodenal bulb. The largest lesion was 12 mm in largest       dimension.      The exam was otherwise without abnormality. Impression:               - Linear esophageal ulcer. Biopsied.                           -  Non-bleeding gastric ulcer with no stigmata of                            bleeding. Biopsied.                           - Erosive gastropathy with no bleeding and no                            stigmata of recent bleeding.                           - Non-bleeding duodenal ulcers with pigmented                            material.                           - The examination was otherwise normal. Moderate Sedation:      Not Applicable - Patient had care per  Anesthesia. Recommendation:           - Patient has a contact number available for                            emergencies. The signs and symptoms of potential                            delayed complications were discussed with the                            patient. Return to normal activities tomorrow.                            Written discharge instructions were provided to the                            patient.                           - Advance diet as tolerated.                           - No aspirin, ibuprofen, naproxen, or other                            non-steroidal anti-inflammatory drugs.                           - Start pantoprazole 40 mg BID.                           - Await pathology results.                           - Continue serial hgb/hct with transfusion as  indicated.                           - Repeat upper endoscopy in 12 weeks to check                            healing.                           The results and recommendations were reviewed with                            the patient in the recovery room. A copy of the                            procedure report was given to the patient. Procedure Code(s):        --- Professional ---                           [phone], Esophagogastroduodenoscopy, flexible,                            transoral; with biopsy, single or multiple Diagnosis Code(s):        --- Professional ---                           K25.9, Gastric ulcer, unspecified as acute or                            chronic, without hemorrhage or perforation                           K31.89, Other diseases of stomach and duodenum                           K26.9, Duodenal ulcer, unspecified as acute or                            chronic, without hemorrhage or perforation                           D62, Acute posthemorrhagic anemia CPT copyright 2019 American Medical Association. All rights reserved. The codes documented in this  report are preliminary and upon coder review may  be revised to meet current compliance requirements. Thornton Park MD, MD 04/11/2020 3:27:50 PM This report has been signed electronically. Number of Addenda: 0

## 2020-04-10 NOTE — Progress Notes (Signed)
PHARMACY NOTE:  ANTIMICROBIAL RENAL DOSAGE ADJUSTMENT  Current antimicrobial regimen includes a mismatch between antimicrobial dosage and estimated renal function.  As per policy approved by the Pharmacy & Therapeutics and Medical Executive Committees, the antimicrobial dosage will be adjusted accordingly.  Current antimicrobial dosage:  Cefazolin 2 g iv q 12 hours  Indication: bacteremia  Renal Function:  Estimated Creatinine Clearance: 32.9 mL/min (A) (by C-G formula based on SCr of 2.18 mg/dL (H)). []      On intermittent HD, scheduled: []      On CRRT    Antimicrobial dosage has been changed to:  Cefazolin 2 g iv q 8 hours  Additional comments:   Thank you for allowing pharmacy to be a part of this patient's care.  Napoleon Form, [facility] 04/09/2020 7:36 AM

## 2020-04-10 NOTE — Progress Notes (Signed)
PROGRESS NOTE   Deborah Figueroa  KKX:[id number] DOB: [birth year] DOA: 03/16/2020 PCP: Ann Held, DO  Brief Narrative:  65 year old white female DM TY 2 uncontrolled Chronic hypercalcemia 2/2 hypothyroidism Depression HTN HLD Admitted 03/22/2019 with DKA Found to have sepsis in setting of MSSA bacteremia left olecranon bursitis/osteomyelitis Orthopedics consulted underwent elbow I&D 1/10 infectious disease saw the patient recommended IV antibiotics for 6 weeks Patient developed acute respiratory failure due to hospital-acquired pneumonia as well as blood loss from epistaxis and positive Hemoccult   Assessment & Plan:   Principal Problem:   MSSA bacteremia Active Problems:   Hypercalcemia   Depression, major, single episode, moderate (HCC)   DKA (diabetic ketoacidosis) ([place])   Acute pain of right shoulder   Left elbow pain   Acute kidney injury ([place])   Weakness   Recurrent falls   Sepsis due to methicillin susceptible Staphylococcus aureus ([place])   Osteomyelitis of left elbow ([place])   Osteomyelitis of left ulna (HCC)   Sepsis ([place])   Blood in the stool   Anemia due to GI blood loss   Coagulopathy ([place])   1. MSSA bacteremia chronic olecranon bursitis and phlegmon a. Stop date for cefazolin appears to be through 05/05/2020-TEE negative for endocarditis-repeat blood culture 1/11  b. Per ID needs minimum 4 weeks IV prior to consideration for change to oral. 2. Severe hypokalemia a. Replace orally with 40 mg 3 times daily today b. Recheck in a.m. with magnesium 3. Acute metabolic encephalopathy a. Seems to have resolved-MRI brain this admission negative 4. Oropharyngeal thrush a. Add fluconazole stop in 10 days, change Magic mouthwash to nystatin b. Hopefully will improve scope will clarify several thanks 5. Hypoxemic respiratory failure secondary to HCAP a. Desat to 87% previously b. Treated with broad-spectrum antibiotics and now just treating as above for  MSSA 6. Right apical mass a. Outpatient further characterization b. We will need to be set up once discharged 7. 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 Metabolic acidosis a. Baseline creatinine prior to this admit about 1.5 back in 2018 b. Avoid nephrotoxins c. Continue bicarb 3 times daily 9. Diabetes mellitus with complications of DKA on admit-currently hypoglycemic a. DKA physiology resolved b. cut back sliding scale coverage from 5 to 3 units 3 times daily meals c. CBGs are well controlled in the 1 20-1 50 range 10. Severe hypercalcemia a. Likely secondary to DKA and normalized 11. ?  Rectovaginal fistula a. Outpatient characterization 12. Acute on chronic normocytic anemia 13. Epistaxis coagulopathy a. Stable at this time b. Planning for endoscopy colonoscopy given reports of dark stool/bloody stool c. Planning for colonoscopy and EGD on 1/28 14. Superficial DVT right cephalic vein a. Not a candidate for anticoagulation 15. Depression a. Continue Zoloft 100 daily  DVT prophylaxis: Lovenox Code Status: Full Family Communication:  Disposition:  Status is: Inpatient  Remains inpatient appropriate because:Hemodynamically unstable, Ongoing diagnostic testing needed not appropriate for outpatient work up, Unsafe d/c plan and IV treatments appropriate due to intensity of illness or inability to take PO   Dispo: The patient is from: Home              Anticipated d/c is to: Unknown              Anticipated d/c date is: 3 days              Patient currently is not medically stable to d/c.   Difficult to place patient No  Consultants:   ID  GI    Procedures:   Antimicrobials: Currently cefazolin   Subjective: Feels a sticking feeling in the throat No chest pain no fever Stool is still not clear No other issue   Objective: Vitals:   04/09/20 0540 04/09/20 1300 04/09/20 2011 04/03/2020 0429  BP: (!) 153/89 (!) 151/89 (!) 160/88 (!) 154/76   Pulse: 86 80 83 81  Resp: 20 20 19 19   Temp: 98.2 F (36.8 C) 97.9 F (36.6 C) 98 F (36.7 C) 98.3 F (36.8 C)  TempSrc: Oral Oral Oral Oral  SpO2: 99% 98% 100% 97%  Weight:      Height:        Intake/Output Summary (Last 24 hours) at 04/05/2020 0946 Last data filed at 03/31/2020 0500 Gross per 24 hour  Intake 579.86 ml  Output 3 ml  Net 576.86 ml   Filed Weights   03/25/2020 0952  Weight: 93.9 kg    Examination: No focal deficit pleasant thrush is improved Thick neck anicteric no pallor CTA B no added sound no rales no rhonchi Left upper extremity moves well without deficit Abdomen soft nontender Neurologically intact moving all 4 limbs equally Grade 1 lower extremity edema   Data Reviewed: I have personally reviewed following labs and imaging studies  Potassium 2.6  BUNs/creatinine 53/2.3- >52/2.1 Hemoglobin 7.1 White count 9.3   COVID-19 Labs  No results for input(s): DDIMER, FERRITIN, LDH, CRP in the last 72 hours.  Lab Results  Component Value Date   [place] NEGATIVE 03/16/2020     Radiology Studies: No results found.   Scheduled Meds: . (feeding supplement) PROSource Plus  30 mL Oral TID BM  . atorvastatin  40 mg Oral Daily  . docusate sodium  100 mg Oral BID  . feeding supplement  1 Container Oral TID BM  . fenofibrate  160 mg Oral Daily  . fluconazole  100 mg Oral Daily  . insulin aspart  0-15 Units Subcutaneous Q4H  . insulin aspart  3 Units Subcutaneous TID WC  . insulin glargine  9 Units Subcutaneous QHS  . magic mouthwash  2 mL Oral QID  . melatonin  3 mg Oral QHS  . metoprolol succinate  100 mg Oral Daily  . nystatin   Topical TID  . polyethylene glycol  17 g Oral Daily  . potassium chloride  40 mEq Oral TID  . sertraline  100 mg Oral Daily  . sodium bicarbonate  650 mg Oral TID   Continuous Infusions: .  ceFAZolin (ANCEF) IV       LOS: 20 days    Time spent: [place], MD [facility] To contact  the attending provider between 7A-7P or the covering provider during after hours 7P-7A, please log into the web site [email or web address] and access using universal [place] password for that web site. If you do not have the password, please call the hospital operator.  04/05/2020, 9:46 AM

## 2020-04-10 NOTE — Anesthesia Postprocedure Evaluation (Signed)
Anesthesia Post Note  Patient: KAILEENA OBI  Procedure(s) Performed: ESOPHAGOGASTRODUODENOSCOPY (EGD) WITH PROPOFOL (N/A ) COLONOSCOPY WITH PROPOFOL (N/A ) BIOPSY POLYPECTOMY     Patient location during evaluation: PACU Anesthesia Type: MAC Level of consciousness: awake and alert and oriented Pain management: pain level controlled Vital Signs Assessment: post-procedure vital signs reviewed and stable Respiratory status: spontaneous breathing, nonlabored ventilation and respiratory function stable Cardiovascular status: blood pressure returned to baseline Postop Assessment: no apparent nausea or vomiting Anesthetic complications: no   No complications documented.  Last Vitals:  Vitals:   03/29/2020 1540 03/15/2020 1550  BP: (!) 153/78 (!) 151/80  Pulse: 81 81  Resp: 15 14  Temp:    SpO2: 97% 96%    Last Pain:  Vitals:   03/22/2020 1550  TempSrc:   PainSc: 0-No pain                 Brennan Bailey

## 2020-04-10 NOTE — Progress Notes (Signed)
CRITICAL VALUE ALERT  Critical Value:  Potassium 2.6  Date & Time Notied:  04/07/2020 7:39  Provider Notified: Dr. Joyce Gross- Rogelia Boga Samtini  Orders Received/Actions taken:  Jerene Pitch

## 2020-04-10 NOTE — Interval H&P Note (Signed)
History and Physical Interval Note:  2/57/5051 8:33 PM  Deborah Figueroa  has presented today for surgery, with the diagnosis of Anemia.  The various methods of treatment have been discussed with the patient and family. After consideration of risks, benefits and other options for treatment, the patient has consented to  Procedure(s): ESOPHAGOGASTRODUODENOSCOPY (EGD) WITH PROPOFOL (N/A) COLONOSCOPY WITH PROPOFOL (N/A) as a surgical intervention.  The patient's history has been reviewed, patient examined, no change in status, stable for surgery.  I have reviewed the patient's chart and labs.  Questions were answered to the patient's satisfaction.     [place]

## 2020-04-11 LAB — CBC WITH DIFFERENTIAL/PLATELET
Abs Immature Granulocytes: 0.74 10*3/uL — ABNORMAL HIGH (ref 0.00–0.07)
Basophils Absolute: 0 10*3/uL (ref 0.0–0.1)
Basophils Relative: 0 %
Eosinophils Absolute: 0.1 10*3/uL (ref 0.0–0.5)
Eosinophils Relative: 1 %
HCT: 23.9 % — ABNORMAL LOW (ref 36.0–46.0)
Hemoglobin: 7.3 g/dL — ABNORMAL LOW (ref 12.0–15.0)
Immature Granulocytes: 7 %
Lymphocytes Relative: 5 %
Lymphs Abs: 0.5 10*3/uL — ABNORMAL LOW (ref 0.7–4.0)
MCH: 27.4 pg (ref 26.0–34.0)
MCHC: 30.5 g/dL (ref 30.0–36.0)
MCV: 89.8 fL (ref 80.0–100.0)
Monocytes Absolute: 0.3 10*3/uL (ref 0.1–1.0)
Monocytes Relative: 3 %
Neutro Abs: 8.4 10*3/uL — ABNORMAL HIGH (ref 1.7–7.7)
Neutrophils Relative %: 84 %
Platelets: 209 10*3/uL (ref 150–400)
RBC: 2.66 MIL/uL — ABNORMAL LOW (ref 3.87–5.11)
RDW: 17.9 % — ABNORMAL HIGH (ref 11.5–15.5)
WBC: 10.1 10*3/uL (ref 4.0–10.5)
nRBC: 0 % (ref 0.0–0.2)

## 2020-04-11 LAB — COMPREHENSIVE METABOLIC PANEL
ALT: <5 U/L (ref 0–44)
AST: 12 U/L — ABNORMAL LOW (ref 15–41)
Albumin: 1.5 g/dL — ABNORMAL LOW (ref 3.5–5.0)
Alkaline Phosphatase: 68 U/L (ref 38–126)
Anion gap: 14 (ref 5–15)
BUN: 47 mg/dL — ABNORMAL HIGH (ref 8–23)
CO2: 15 mmol/L — ABNORMAL LOW (ref 22–32)
Calcium: 9.8 mg/dL (ref 8.9–10.3)
Chloride: 114 mmol/L — ABNORMAL HIGH (ref 98–111)
Creatinine, Ser: 2.12 mg/dL — ABNORMAL HIGH (ref 0.44–1.00)
GFR, Estimated: 26 mL/min — ABNORMAL LOW (ref >60)
Glucose, Bld: 188 mg/dL — ABNORMAL HIGH (ref 70–99)
Potassium: 3 mmol/L — ABNORMAL LOW (ref 3.5–5.1)
Sodium: 143 mmol/L (ref 135–145)
Total Bilirubin: 0.6 mg/dL (ref 0.3–1.2)
Total Protein: 5.6 g/dL — ABNORMAL LOW (ref 6.5–8.1)

## 2020-04-11 LAB — FACTOR 5 ASSAY: Factor V Activity: 85 % (ref 70–150)

## 2020-04-11 LAB — PT FACTOR INHIBITOR (MIXING STUDY)
1 HR INCUB PT 1:1NP: 11 s (ref 9.1–12.0)
PT 1:1NP: 11.2 s (ref 9.1–12.0)
PT: 12.7 s — ABNORMAL HIGH (ref 9.1–12.0)

## 2020-04-11 LAB — PTT FACTOR INHIBITOR (MIXING STUDY): aPTT: 30.6 s — ABNORMAL HIGH (ref 22.9–30.2)

## 2020-04-11 LAB — GLUCOSE, CAPILLARY
Glucose-Capillary: 148 mg/dL — ABNORMAL HIGH (ref 70–99)
Glucose-Capillary: 160 mg/dL — ABNORMAL HIGH (ref 70–99)
Glucose-Capillary: 177 mg/dL — ABNORMAL HIGH (ref 70–99)
Glucose-Capillary: 160 mg/dL — ABNORMAL HIGH (ref 70–99)
Glucose-Capillary: 158 mg/dL — ABNORMAL HIGH (ref 70–99)

## 2020-04-11 LAB — MAGNESIUM: Magnesium: 1.5 mg/dL — ABNORMAL LOW (ref 1.7–2.4)

## 2020-04-11 MED ORDER — KCL-LACTATED RINGERS 20 MEQ/L IV SOLN
INTRAVENOUS | Status: DC
  Filled 2020-04-11: qty 1000

## 2020-04-11 MED ORDER — SUCRALFATE 1 G PO TABS
1.0000 g | ORAL_TABLET | Freq: Three times a day (TID) | ORAL | Status: DC
  Administered 2020-04-11 – 2020-04-20 (×15): 1 g via ORAL
  Filled 2020-04-11 (×27): qty 1

## 2020-04-11 MED ORDER — MAGNESIUM SULFATE 2 GM/50ML IV SOLN
2.0000 g | Freq: Once | INTRAVENOUS | Status: AC
  Administered 2020-04-11: 2 g via INTRAVENOUS
  Filled 2020-04-11: qty 50

## 2020-04-11 MED ORDER — POTASSIUM CHLORIDE 2 MEQ/ML IV SOLN
INTRAVENOUS | Status: DC
  Filled 2020-04-11: qty 1000

## 2020-04-11 NOTE — Hospital Course (Addendum)
65 year old woman admitted 1/8 with DKA, found to have sepsis in the setting of MSSA bacteremia secondary to left olecranon bursitis and osteomyelitis of the olecranon and proximal ulna, underwent incision, bone debridement and drainage per orthopedics, seen by infectious disease with plans for IV antibiotics for 6 weeks.  Developed acute respiratory failure secondary to pneumonia as well as blood loss anemia from epistaxis.  Underwent EGD and colonoscopy showing stercoral ulcer and gastric ulcer.  Imaging suggested lung mass concerning for malignancy, abnormal pancreatic mass, thyroid nodule.  MRI obtained however more suggestive of fluid and infection rather than mass. Discussed with neurosurgery, recommended continuing antibiotics, no operative intervention, no indication for IR drainage.  Continued on antibiotics for cervical and thoracic paravertebral infection, thoracic discitis and osteomyelitis.  2/7 developed hypotension, worsening AKI, metabolic acidosis concerning for developing sepsis and transferred to stepdown unit.  Later started on vasopressors for persistent hypotension.  Further imaging revealed acute pancreatitis versus intra-abdominal fluid collections.  Clinical condition remains guarded with multiple consultants involved.  A & P  Shock with associated AKI, acute anion gap metabolic acidosis, coagulopathy --Now on vasopressors, renal function may be stabilizing although she is still acidotic despite increased bicarb.  Etiology remains unclear lactic acid is within normal limits and clinically she does not appear septic and her mentation is stable.  Blood sugars are stable, no reason to suspect euglycemic DKA. --Although CT shows fluid collections and pancreatitis, she has no abdominal symptoms other than nausea and lipase is within normal limits.  Concern for intra-abdominal infection. --Appreciate GI involvement, IR involvement.  Plan for IR procedure and drain placement when clinically  improved.  Possible pancreatitis, intra-abdominal fluid collections of uncertain etiology.  LFTs and lipase unremarkable. --Concern for intra-abdominal infection.  Continue antibiotics per ID.  Plan as above to proceed with drain placement when more stable.  Coagulopathy of unclear etiology --Improved with high-dose vitamin K and FFP.  No signs of bleeding. --Continue management as per hematology.  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 --Continue antibiotics through minimum 2/22. --Continue OT for triceps activation.  Follow-up with [facility] as an outpatient.  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  Discussed with neurosurgery Dr. Reatha Armour, who recommended ongoing antibiotics, no role for surgical intervention or IR drainage. --Per ID considering prolonged length of therapy to 8 weeks and oral antibiotics after that.  Repeat imaging in 8 weeks. --Consider lumbar MRI when stable  Lung abscess right apex.  Discussed with pulmonology 2/5.  Recommends antibiotics, repeat study in the outpatient setting to ensure resolution.  Could consider IR aspiration. --No hypoxia and no pulmonary symptoms.  Repeat chest x-ray without change. --Reimage in the future to ensure resolution.  AKI superimposed on CKD stage IIIb -baseline unclear, but probably around 1.7.  Renal function hopefully has peaked at this time.  Urine output relatively low.  Nutrition --Remain n.p.o.  As per GI, no NG tube. --PICC line.  May need TPN.  Primary hyperparathyroidism with hypercalcemia.  Followed by endocrinology as an outpatient, noted to be a candidate for surgery prior to admission.   --Follow-up with ENT after completion of treatment for bacteremia. --Calcium trending back up.  Treated with calcitonin.  Nephrology considering bisphosphonate.  Diabetes mellitus type 2 with  hemoglobin A1c 10.4. --blood sugars remain stable.  Generalized weakness with recurrent falls in the setting of hypercalcemia, bacteremia and DKA.  MRI brain negative. --unsafe d/c, cannot  care for self, no support, homeless, no insurance. No bed offers from SNF.  Difficult to place.  Left-sided thyroid nodule minimally suppressed TSH with low free T3 and T4 within normal limits. --Recommend outpatient ultrasound, however given difficult disposition, will obtain in-house  Acute on chronic normocytic anemia, anemia of critical illness.  Hemoccult positive but no overt GI bleed.  Earlier in hospitalization had epistaxis and coagulopathy, gum bleeding. Has required multiple transfusions. EGD/colonoscopy unrevealing. Hematology without specific recs at this point. Bone marrow suppression suspected as well. --Will trend hemoglobin.  Transfuse as needed.  Lower extremity edema secondary to hypoalbuminemia.  Echocardiogram was unremarkable. --Continue TED hose.  Aortic atherosclerosis --No treatment indicated.  Stercoral ulcer, fecal impaction noted 1/28.  Asymptomatic.  Continue aggressive bowel regimen recommended to keep stool soft and prevent recurrence of fecal impaction.   --Asymptomatic.  Continue bowel regimen.  Rectovaginal fistula?.  Stool noted from the vagina?.  Consider GYN evaluation as outpatient.  Not clear whether this is actual. --Follow-up as an outpatient.  Appears to be asymptomatic at this time.  RESOLVED Sepsis secondary to MSSA bacteremia  DKA Severe hypercalcemia superimposed on chronic hypercalcemia secondary to primary hyperparathyroidism. Followed by endocrinology as an outpatient. Treated with bisphosphonate and calcitonin. Acute hypoxemic respiratory failure with uncompensated respiratory acidosis secondary to bilateral lower lobe pneumonia Acute metabolic encephalopathy.  MRI brain no acute finding. Oropharyngeal thrush

## 2020-04-11 NOTE — Progress Notes (Signed)
PROGRESS NOTE   Deborah Figueroa  ONG:[id number] DOB: 18-Aug-1955 DOA: 04/12/2020 PCP: Ann Held, DO  Brief Narrative:  65 year old white female DM TY 2 uncontrolled Chronic hypercalcemia 2/2 hypothyroidism Depression HTN HLD Admitted 03/22/2019 with DKA Found to have sepsis in setting of MSSA bacteremia left olecranon bursitis/osteomyelitis Orthopedics consulted underwent elbow I&D 1/10 infectious disease saw the patient recommended IV antibiotics for 6 weeks Patient developed acute respiratory failure due to hospital-acquired pneumonia as well as blood loss from epistaxis and positive Hemoccult At EGD colonoscopy 1/28   Assessment & Plan:   Principal Problem:   MSSA bacteremia Active Problems:   Hypercalcemia   Depression, major, single episode, moderate (HCC)   DKA (diabetic ketoacidosis) (HCC)   Acute pain of right shoulder   Left elbow pain   Acute kidney injury ([place])   Weakness   Recurrent falls   Sepsis due to methicillin susceptible Staphylococcus aureus ([place])   Osteomyelitis of left elbow (HCC)   Osteomyelitis of left ulna (HCC)   Sepsis ([place])   Blood in the stool   Anemia due to GI blood loss   Coagulopathy (HCC)   Duodenal ulcer   Acute gastric ulcer without hemorrhage or perforation   Adenomatous polyp of ascending colon   Adenomatous polyp of descending colon   Stercoral ulcer of rectum   Fecal impaction ([place])   1. MSSA bacteremia chronic olecranon bursitis and phlegmon a. Stop date for cefazolin appears to be through 05/05/2020-TEE negative for endocarditis-repeat blood culture 1/11  b. Per ID needs minimum 4 weeks IV prior to consideration for change to oral. 2. Severe hypokalemia a. Replace orally with 40 mg 3 times daily today b. Stopped IV fluid with potassium as feeling swollen c. Replace potassium 2 g IV 3. Acute metabolic encephalopathy a. Seems to have resolved-MRI brain this admission negative 4. Oropharyngeal thrush a. Add  fluconazole stop in 10 days, change Magic mouthwash to nystatin b. Improvement today 5. Hypoxemic respiratory failure secondary to HCAP a. Desat to 87% previously b. Treated with broad-spectrum antibiotics and now just treating as above for MSSA 6. Right apical mass a. Outpatient further characterization b. We will need to be set up once discharged 7. 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 Metabolic acidosis a. Baseline creatinine prior to this admit about 1.5 back in 2018 b. Avoid nephrotoxins c. Continue bicarb 3 times daily 9. Diabetes mellitus with complications of DKA on admit-currently hypoglycemic a. DKA physiology resolved b. cut back sliding scale coverage from 5 to 3 units 3 times daily meals c. CBGs are well controlled in the 1 48-1 60 eating 50% of meals 10. Severe hypercalcemia a. Likely secondary to DKA and normalized 11. ?  Rectovaginal fistula a. Outpatient characterization 12. Acute on chronic normocytic anemia 13. Epistaxis coagulopathy a. Stable at this time b. Scope as below adding Carafate given mild vomiting on 1/29 continue PPI twice daily per GI 14. Superficial DVT right cephalic vein a. Not a candidate for anticoagulation 15. Depression a. Continue Zoloft 100 daily  DVT prophylaxis: Lovenox Code Status: Full Family Communication:  Disposition:  Status is: Inpatient  Remains inpatient appropriate because:Hemodynamically unstable, Ongoing diagnostic testing needed not appropriate for outpatient work up, Unsafe d/c plan and IV treatments appropriate due to intensity of illness or inability to take PO   Dispo: The patient is from: Home              Anticipated d/c is to: Unknown  Anticipated d/c date is: 3 days              Patient currently is not medically stable to d/c.   Difficult to place patient No       Consultants:   ID  GI    Procedures:  Impression:               - Linear esophageal ulcer. Biopsied.                            - Non-bleeding gastric ulcer with no stigmata of                            bleeding. Biopsied.                           - Erosive gastropathy with no bleeding and no                            stigmata of recent bleeding.                           - Non-bleeding duodenal ulcers with pigmented                            material.                           - The examination was otherwise normal.  Antimicrobials: Currently cefazolin   Subjective: Episodic vomiting 1 episode only eating applesauce No chest pain no fever Overall throat feels better   Objective: Vitals:   04/07/2020 1540 04/05/2020 1550 04/09/2020 1934 04/11/20 0640  BP: (!) 153/78 (!) 151/80 (!) 156/93 (!) 155/86  Pulse: 81 81 87 91  Resp: 15 14 18 18   Temp:   98 F (36.7 C) 97.7 F (36.5 C)  TempSrc:   Oral   SpO2: 97% 96% 99% 100%  Weight:      Height:        Intake/Output Summary (Last 24 hours) at 04/11/2020 1332 Last data filed at 04/11/2020 1028 Gross per 24 hour  Intake 590 ml  Output --  Net 590 ml   Filed Weights   04/13/2020 0952 03/17/2020 1400  Weight: 93.9 kg 93.9 kg    Examination: No focal deficit pleasant thrush is improved Thick neck anicteric no pallor CTA B no added sound no rales no rhonchi Left upper extremity moves well without deficit Abdomen soft nontender Neurologically intact moving all 4 limbs equally Grade 1 lower extremity edema   Data Reviewed: I have personally reviewed following labs and imaging studies  Potassium 2.6---> 3.0 BUNs/creatinine 53/2.3- >52/2.1-->47/2.1 Hemoglobin 7.3 White count 10.1 Magnesium 1.5  COVID-19 Labs  No results for input(s): DDIMER, FERRITIN, LDH, CRP in the last 72 hours.  Lab Results  Component Value Date   [place] NEGATIVE 03/25/2020     Radiology Studies: No results found.   Scheduled Meds: . (feeding supplement) PROSource Plus  30 mL Oral TID BM  . atorvastatin  40 mg Oral Daily  . docusate sodium   100 mg Oral BID  . feeding supplement  1 Container Oral TID BM  . fenofibrate  160 mg Oral Daily  .  fluconazole  100 mg Oral Daily  . insulin aspart  0-15 Units Subcutaneous Q4H  . insulin aspart  3 Units Subcutaneous TID WC  . insulin glargine  9 Units Subcutaneous QHS  . melatonin  3 mg Oral QHS  . metoprolol succinate  100 mg Oral Daily  . nystatin  5 mL Oral QID  . nystatin   Topical TID  . pantoprazole (PROTONIX) IV  40 mg Intravenous Q12H  . polyethylene glycol  17 g Oral Daily  . potassium chloride  40 mEq Oral TID  . sertraline  100 mg Oral Daily  . sodium bicarbonate  650 mg Oral TID  . sucralfate  1 g Oral TID WC & HS   Continuous Infusions: .  ceFAZolin (ANCEF) IV 2 g (04/11/20 0527)     LOS: 21 days    Time spent: [place], MD [facility] To contact the attending provider between 7A-7P or the covering provider during after hours 7P-7A, please log into the web site [email or web address] and access using universal [place] password for that web site. If you do not have the password, please call the hospital operator.  04/11/2020, 1:32 PM

## 2020-04-11 NOTE — Progress Notes (Signed)
Physical Therapy Treatment Patient Details Name: Deborah Figueroa MRN: [id number] DOB: Mar 05, 1956 Today's Date: 04/11/2020    History of Present Illness 65 year old woman presenting with diabetic ketoacidosis and acute renal failure found to have MSSA bacteremia of unclear etiology. PMH of HTN, HLD, and IDDM who presents the ED via EMS for weakness and hyperglycemia.  Pt s/p I and D left elbow infection and osteomyelitis 03/19/2020. Elbow stitches removed 1/20 and patient now with fistula.    PT Comments    Max encouragement for pt to put forth full effort. Poorly motivated. Pt is likely able to do more for herself. She has multiple reasons/excuses as to why she can't mobilize. She quickly responds with "I can't" or "you move it for me" when encouraged to participate. Heavy +2 assist for mobility. Will need SNF.     Follow Up Recommendations  SNF     Equipment Recommendations  None recommended by PT    Recommendations for Other Services       Precautions / Restrictions Precautions Precautions: Fall Precaution Comments: left elbow active use to tolerance per ortho notes, may not obtain tricep activation (L) per ortho note. Restrictions Weight Bearing Restrictions: No    Mobility  Bed Mobility Overal bed mobility: Needs Assistance Bed Mobility: Rolling;Sidelying to Sit Rolling: Mod assist;+2 for physical assistance;+2 for safety/equipment Sidelying to sit: Max assist;+2 for physical assistance;+2 for safety/equipment     Sit to sidelying: Max assist;+2 for physical assistance;+2 for safety/equipment General bed mobility comments: Max encouragement for pt to put forth full effort. Assist to postion LEs in preparation for rolling. Assist for trunk and bil LEs. Utilized bedpad for scooting, positioning. Sat EOB for at least 8 minutes. Pt c/o dizziness that resolved with time.  Transfers                 General transfer comment: NT-pt declined to attempt  Ambulation/Gait                  Stairs             Wheelchair Mobility    Modified Rankin (Stroke Patients Only)       Balance Overall balance assessment: Needs assistance Sitting-balance support: Bilateral upper extremity supported;Feet supported Sitting balance-Leahy Scale: Poor Sitting balance - Comments: Pt performed some LE exercises                                    Cognition Arousal/Alertness: Awake/alert   Overall Cognitive Status: Within Functional Limits for tasks assessed                                 General Comments: poorly motivated. multiple reasons/excuses as to why she can't do things. pt can likely do more if she would put forth more effort      Exercises General Exercises - Lower Extremity Ankle Circles/Pumps: AROM;Both;10 reps Long Arc Quad: AROM;Both;10 reps    General Comments        Pertinent Vitals/Pain Pain Assessment: Faces Faces Pain Scale: Hurts even more Pain Location: abdomen, peri area Pain Descriptors / Indicators: Grimacing;Discomfort Pain Intervention(s): Limited activity within patient's tolerance;Monitored during session;Repositioned    Home Living                      Prior Function  PT Goals (current goals can now be found in the care plan section) Progress towards PT goals: Progressing toward goals    Frequency    Min 2X/week      PT Plan Current plan remains appropriate    Co-evaluation              AM-PAC PT "6 Clicks" Mobility   Outcome Measure  Help needed turning from your back to your side while in a flat bed without using bedrails?: A Lot Help needed moving from lying on your back to sitting on the side of a flat bed without using bedrails?: A Lot Help needed moving to and from a bed to a chair (including a wheelchair)?: Total Help needed standing up from a chair using your arms (e.g., wheelchair or bedside chair)?: Total Help needed to walk in  hospital room?: Total Help needed climbing 3-5 steps with a railing? : Total 6 Click Score: 8    End of Session   Activity Tolerance: Patient tolerated treatment well Patient left: in bed;with call bell/phone within reach;with bed alarm set   PT Visit Diagnosis: Muscle weakness (generalized) (M62.81);Other abnormalities of gait and mobility (R26.89)     Time: 5631-4970 PT Time Calculation (min) (ACUTE ONLY): 31 min  Charges:  $Therapeutic Activity: 23-37 mins                        Doreatha Massed, PT Acute Rehabilitation  Office: [phone] Pager: [phone]

## 2020-04-11 NOTE — Progress Notes (Signed)
Subjective: Feeling better.  Objective: Vital signs in last 24 hours: Temp:  [97.7 F (36.5 C)-98.2 F (36.8 C)] 98.2 F (36.8 C) (01/29 1340) Pulse Rate:  [81-95] 95 (01/29 1340) Resp:  [14-18] 16 (01/29 1340) BP: (150-156)/(78-93) 150/80 (01/29 1340) SpO2:  [96 %-100 %] 98 % (01/29 1340) Last BM Date: 03/29/2020  Intake/Output from previous day: 01/28 0701 - 01/29 0700 In: 350 [I.V.:250; IV Piggyback:100] Out: -  Intake/Output this shift: Total I/O In: 240 [P.O.:240] Out: -   General appearance: alert and no distress GI: soft, non-tender; bowel sounds normal; no masses,  no organomegaly  Lab Results: Recent Labs    04/09/20 0535 04/11/2020 0557 04/11/20 0555  WBC 9.6 9.3 10.1  HGB 7.5* 7.1* 7.3*  HCT 24.2* 23.3* 23.9*  PLT 219 217 209   BMET Recent Labs    04/09/20 0535 03/16/2020 0557 03/27/2020 1248 04/11/20 0555  NA 141 143  --  143  K 3.4* 2.6* 2.9* 3.0*  CL 112* 113*  --  114*  CO2 16* 15*  --  15*  GLUCOSE 110* 135*  --  188*  BUN 53* 52*  --  47*  CREATININE 2.33* 2.18*  --  2.12*  CALCIUM 9.5 9.4  --  9.8   LFT Recent Labs    04/11/20 0555  PROT 5.6*  ALBUMIN 1.5*  AST 12*  ALT <5  ALKPHOS 68  BILITOT 0.6   PT/INR Recent Labs    03/22/2020 1708 04/11/2020 1841  LABPROT SPECIMEN CLOTTED 15.2  INR NOT CALCULATED 1.3*   Hepatitis Panel No results for input(s): HEPBSAG, HCVAB, HEPAIGM, HEPBIGM in the last 72 hours. C-Diff No results for input(s): CDIFFTOX in the last 72 hours. Fecal Lactopherrin No results for input(s): FECLLACTOFRN in the last 72 hours.  Studies/Results: No results found.  Medications:  Scheduled: . (feeding supplement) PROSource Plus  30 mL Oral TID BM  . atorvastatin  40 mg Oral Daily  . docusate sodium  100 mg Oral BID  . feeding supplement  1 Container Oral TID BM  . fenofibrate  160 mg Oral Daily  . fluconazole  100 mg Oral Daily  . insulin aspart  0-15 Units Subcutaneous Q4H  . insulin aspart  3 Units  Subcutaneous TID WC  . insulin glargine  9 Units Subcutaneous QHS  . melatonin  3 mg Oral QHS  . metoprolol succinate  100 mg Oral Daily  . nystatin  5 mL Oral QID  . nystatin   Topical TID  . pantoprazole (PROTONIX) IV  40 mg Intravenous Q12H  . polyethylene glycol  17 g Oral Daily  . potassium chloride  40 mEq Oral TID  . sertraline  100 mg Oral Daily  . sodium bicarbonate  650 mg Oral TID  . sucralfate  1 g Oral TID WC & HS   Continuous: .  ceFAZolin (ANCEF) IV 2 g (04/11/20 0527)  . magnesium sulfate bolus IVPB      Assessment/Plan: 1) DU and GU. 2) Anemia - Stable. 3) Stercoral ulcer.   The patient is stable.  Her HGB is stable and there is no evidence of any active GI bleeding, currently.  A nonbleeding stercoral ulcer was identified yesterday and she feels better with the disimpaction.  Plan: 1) PPI BID.  She can be converted over to oral pantoprazole. 2) Avoid sennakot for a laxative as she does not feel well with taking the medication. 3) Continue with Miralax. 4) Signing off.  Call with any questions.  LOS: 21 days   Dalicia Kisner D 04/11/2020, 3:33 PM

## 2020-04-11 NOTE — Plan of Care (Signed)
  Problem: Skin Integrity: Goal: Risk for impaired skin integrity will decrease Outcome: Progressing   Problem: Fluid Volume: Goal: Ability to maintain a balanced intake and output will improve Outcome: Progressing   Problem: Activity: Goal: Risk for activity intolerance will decrease Outcome: Not Progressing   Problem: Nutrition: Goal: Adequate nutrition will be maintained Outcome: Not Progressing  Patient still presenting with low willingness to increase activity. Patient unable to tolerate food at this time due to vomiting episode.

## 2020-04-12 LAB — GLUCOSE, CAPILLARY
Glucose-Capillary: 109 mg/dL — ABNORMAL HIGH (ref 70–99)
Glucose-Capillary: 112 mg/dL — ABNORMAL HIGH (ref 70–99)
Glucose-Capillary: 131 mg/dL — ABNORMAL HIGH (ref 70–99)
Glucose-Capillary: 76 mg/dL (ref 70–99)
Glucose-Capillary: 93 mg/dL (ref 70–99)
Glucose-Capillary: 97 mg/dL (ref 70–99)

## 2020-04-12 MED ORDER — PANTOPRAZOLE SODIUM 40 MG PO TBEC
40.0000 mg | DELAYED_RELEASE_TABLET | Freq: Two times a day (BID) | ORAL | Status: DC
  Administered 2020-04-12 – 2020-04-19 (×10): 40 mg via ORAL
  Filled 2020-04-12 (×13): qty 1

## 2020-04-12 NOTE — Progress Notes (Signed)
PROGRESS NOTE   Deborah Figueroa  MAU:[id number] DOB: 25-Dec-1955 DOA: 04/08/2020 PCP: Ann Held, DO  Brief Narrative:  65 year old white female DM TY 2 uncontrolled Chronic hypercalcemia 2/2 hypothyroidism Depression HTN HLD Admitted 03/22/2019 with DKA Found to have sepsis in setting of MSSA bacteremia left olecranon bursitis/osteomyelitis Orthopedics consulted underwent elbow I&D 1/10 infectious disease saw the patient recommended IV antibiotics for 6 weeks Patient developed acute respiratory failure due to hospital-acquired pneumonia as well as blood loss from epistaxis and positive Hemoccult Had EGD and colonoscopy 1/28 showing stercoral ulcer and gastric ulcer Awaiting placement at this time   Assessment & Plan:   Principal Problem:   MSSA bacteremia Active Problems:   Hypercalcemia   Depression, major, single episode, moderate (HCC)   DKA (diabetic ketoacidosis) ([place])   Acute pain of right shoulder   Left elbow pain   Acute kidney injury ([place])   Weakness   Recurrent falls   Sepsis due to methicillin susceptible Staphylococcus aureus ([place])   Osteomyelitis of left elbow (HCC)   Osteomyelitis of left ulna (HCC)   Sepsis ([place])   Blood in the stool   Anemia due to GI blood loss   Coagulopathy (HCC)   Duodenal ulcer   Acute gastric ulcer without hemorrhage or perforation   Adenomatous polyp of ascending colon   Adenomatous polyp of descending colon   Stercoral ulcer of rectum   Fecal impaction ([place])   1. MSSA bacteremia chronic olecranon bursitis and phlegmon a. Stop date for cefazolin appears to be through 05/05/2020-TEE negative for endocarditis-repeat blood culture 1/11  b. Per ID needs minimum 4 weeks IV prior to consideration for change to oral c.   We will reach out to infectious disease in the next several weeks to see when she can be transitioned 2. Severe hypokalemia a. Replace orally with 40 mg 3 times daily today b. Tried to replace IV but she  is feeling swollen therefore had to hold IV replacement c. Magnesium still low yesterday so recheck all labs tomorrow and continue to replace 3. Acute metabolic encephalopathy a. Seems to have resolved-MRI brain this admission negative 4. Oropharyngeal thrush a. Add fluconazole to stop on 04/18/2018 20 to 10 days, change Magic mouthwash to nystatin b. Improvement today noted and able to eat little bit more 5. Hypoxemic respiratory failure secondary to HCAP a. Desat to 87% previously b. Treated with broad-spectrum antibiotics and now just treating as above for MSSA 6. Right apical mass a. Outpatient further characterization b. We will need to be set up once discharged in the outpatient setting with pulmonary 7. 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 Metabolic acidosis a. Baseline creatinine prior to this admit about 1.5 back in 2018 b. Avoid nephrotoxins c. Continue bicarb 3 times daily for metabolic acidosis 9. Diabetes mellitus with complications of DKA on admit-currently hypoglycemic a. DKA physiology resolved b. cut back sliding scale coverage from 5 to 3 units 3 times daily meals c. CBGs 76-1 09 eating 50% of meals 10. Severe hypercalcemia a. Likely secondary to DKA and normalized 11. ?  Rectovaginal fistula a. Outpatient characterization 12. Acute on chronic normocytic anemia 13. Epistaxis coagulopathy a. Stable at this time b. Scope as below adding Carafate given mild vomiting on 1/29 continue PPI twice daily per GI 14. Superficial DVT right cephalic vein a. Not a candidate for anticoagulation 15. Depression a. Continue Zoloft 100 daily  DVT prophylaxis: Lovenox Code Status: Full Family Communication:  Disposition:  Status  is: Inpatient  Remains inpatient appropriate because:Hemodynamically unstable, Ongoing diagnostic testing needed not appropriate for outpatient work up, Unsafe d/c plan and IV treatments appropriate due to intensity of illness or inability to  take PO   Dispo: The patient is from: Home              Anticipated d/c is to: Unknown              Anticipated d/c date is: 3 days              Patient currently is not medically stable to d/c.   Difficult to place patient No       Consultants:   ID  GI    Procedures:  Impression:               - Linear esophageal ulcer. Biopsied.                           - Non-bleeding gastric ulcer with no stigmata of                            bleeding. Biopsied.                           - Erosive gastropathy with no bleeding and no                            stigmata of recent bleeding.                           - Non-bleeding duodenal ulcers with pigmented                            material.                           - The examination was otherwise normal.  Antimicrobials: Currently cefazolin, fluconazole   Subjective:  1 episode of vomiting earlier today Eating a little better though and swallowing is improved No chest pain no fever Right arm still is painful and difficult to move at times   Objective: Vitals:   04/11/20 1340 04/11/20 2001 04/12/20 0521 04/12/20 1246  BP: (!) 150/80 (!) 148/94 (!) 145/90 (!) 174/95  Pulse: 95 94 94 79  Resp: 16 18 18 20   Temp: 98.2 F (36.8 C) 97.8 F (36.6 C) 98.4 F (36.9 C) 97.7 F (36.5 C)  TempSrc: Oral Oral Oral Oral  SpO2: 98% 97% 97% 98%  Weight:      Height:        Intake/Output Summary (Last 24 hours) at 04/12/2020 1553 Last data filed at 04/12/2020 1456 Gross per 24 hour  Intake 600 ml  Output 900 ml  Net -300 ml   Filed Weights   03/20/2020 0952 04/10/20 1400  Weight: 93.9 kg 93.9 kg    Examination: No thrush pleasant but sleepy Thick neck anicteric no pallor CTA B no added sound Left upper extremity moves well without deficit but right upper extremity is slightly painful and antalgic Abdomen soft nontender Neurologically intact moving all 4 limbs equally Grade 1 lower extremity edema   Data Reviewed:  I have personally reviewed following labs and imaging studies  No labs  today  COVID-19 Labs  No results for input(s): DDIMER, FERRITIN, LDH, CRP in the last 72 hours.  Lab Results  Component Value Date   [place] NEGATIVE 03/25/2020     Radiology Studies: No results found.   Scheduled Meds: . (feeding supplement) PROSource Plus  30 mL Oral TID BM  . atorvastatin  40 mg Oral Daily  . docusate sodium  100 mg Oral BID  . feeding supplement  1 Container Oral TID BM  . fenofibrate  160 mg Oral Daily  . fluconazole  100 mg Oral Daily  . insulin aspart  0-15 Units Subcutaneous Q4H  . insulin aspart  3 Units Subcutaneous TID WC  . insulin glargine  9 Units Subcutaneous QHS  . melatonin  3 mg Oral QHS  . metoprolol succinate  100 mg Oral Daily  . nystatin  5 mL Oral QID  . nystatin   Topical TID  . pantoprazole  40 mg Oral BID  . polyethylene glycol  17 g Oral Daily  . potassium chloride  40 mEq Oral TID  . sertraline  100 mg Oral Daily  . sodium bicarbonate  650 mg Oral TID  . sucralfate  1 g Oral TID WC & HS   Continuous Infusions: .  ceFAZolin (ANCEF) IV 2 g (04/12/20 1351)     LOS: 22 days    Time spent: 25  Nita Sells, MD [facility] To contact the attending provider between 7A-7P or the covering provider during after hours 7P-7A, please log into the web site [email or web address] and access using universal [place] password for that web site. If you do not have the password, please call the hospital operator.  04/12/2020, 3:53 PM

## 2020-04-13 ENCOUNTER — Encounter (HOSPITAL_COMMUNITY): Payer: Self-pay | Admitting: Gastroenterology

## 2020-04-13 ENCOUNTER — Encounter: Payer: Self-pay | Admitting: Gastroenterology

## 2020-04-13 ENCOUNTER — Other Ambulatory Visit: Payer: Self-pay

## 2020-04-13 LAB — CBC WITH DIFFERENTIAL/PLATELET
Abs Immature Granulocytes: 0.68 10*3/uL — ABNORMAL HIGH (ref 0.00–0.07)
Basophils Absolute: 0 10*3/uL (ref 0.0–0.1)
Basophils Relative: 0 %
Eosinophils Absolute: 0 10*3/uL (ref 0.0–0.5)
Eosinophils Relative: 0 %
HCT: 22.5 % — ABNORMAL LOW (ref 36.0–46.0)
Hemoglobin: 7.1 g/dL — ABNORMAL LOW (ref 12.0–15.0)
Immature Granulocytes: 5 %
Lymphocytes Relative: 4 %
Lymphs Abs: 0.5 10*3/uL — ABNORMAL LOW (ref 0.7–4.0)
MCH: 27.5 pg (ref 26.0–34.0)
MCHC: 31.6 g/dL (ref 30.0–36.0)
MCV: 87.2 fL (ref 80.0–100.0)
Monocytes Absolute: 0.5 10*3/uL (ref 0.1–1.0)
Monocytes Relative: 4 %
Neutro Abs: 11 10*3/uL — ABNORMAL HIGH (ref 1.7–7.7)
Neutrophils Relative %: 87 %
Platelets: 188 10*3/uL (ref 150–400)
RBC: 2.58 MIL/uL — ABNORMAL LOW (ref 3.87–5.11)
RDW: 17.9 % — ABNORMAL HIGH (ref 11.5–15.5)
WBC: 12.7 10*3/uL — ABNORMAL HIGH (ref 4.0–10.5)
nRBC: 0 % (ref 0.0–0.2)

## 2020-04-13 LAB — COMPREHENSIVE METABOLIC PANEL
ALT: <5 U/L (ref 0–44)
AST: 13 U/L — ABNORMAL LOW (ref 15–41)
Albumin: 1.4 g/dL — ABNORMAL LOW (ref 3.5–5.0)
Alkaline Phosphatase: 71 U/L (ref 38–126)
Anion gap: 12 (ref 5–15)
BUN: 49 mg/dL — ABNORMAL HIGH (ref 8–23)
CO2: 18 mmol/L — ABNORMAL LOW (ref 22–32)
Calcium: 10.5 mg/dL — ABNORMAL HIGH (ref 8.9–10.3)
Chloride: 112 mmol/L — ABNORMAL HIGH (ref 98–111)
Creatinine, Ser: 2.29 mg/dL — ABNORMAL HIGH (ref 0.44–1.00)
GFR, Estimated: 23 mL/min — ABNORMAL LOW (ref >60)
Glucose, Bld: 102 mg/dL — ABNORMAL HIGH (ref 70–99)
Potassium: 2.8 mmol/L — ABNORMAL LOW (ref 3.5–5.1)
Sodium: 142 mmol/L (ref 135–145)
Total Bilirubin: 0.3 mg/dL (ref 0.3–1.2)
Total Protein: 5.7 g/dL — ABNORMAL LOW (ref 6.5–8.1)

## 2020-04-13 LAB — GLUCOSE, CAPILLARY
Glucose-Capillary: 105 mg/dL — ABNORMAL HIGH (ref 70–99)
Glucose-Capillary: 108 mg/dL — ABNORMAL HIGH (ref 70–99)
Glucose-Capillary: 153 mg/dL — ABNORMAL HIGH (ref 70–99)
Glucose-Capillary: 89 mg/dL (ref 70–99)
Glucose-Capillary: 93 mg/dL (ref 70–99)
Glucose-Capillary: 96 mg/dL (ref 70–99)

## 2020-04-13 LAB — SURGICAL PATHOLOGY

## 2020-04-13 LAB — MAGNESIUM: Magnesium: 1.6 mg/dL — ABNORMAL LOW (ref 1.7–2.4)

## 2020-04-13 MED ORDER — POTASSIUM CHLORIDE 10 MEQ/100ML IV SOLN
10.0000 meq | INTRAVENOUS | Status: DC
  Filled 2020-04-13: qty 100

## 2020-04-13 MED ORDER — MAGNESIUM SULFATE 2 GM/50ML IV SOLN
2.0000 g | Freq: Once | INTRAVENOUS | Status: AC
  Administered 2020-04-13: 2 g via INTRAVENOUS
  Filled 2020-04-13: qty 50

## 2020-04-13 MED ORDER — POTASSIUM CHLORIDE 10 MEQ/100ML IV SOLN
10.0000 meq | INTRAVENOUS | Status: AC
  Administered 2020-04-13 (×5): 10 meq via INTRAVENOUS
  Filled 2020-04-13 (×4): qty 100

## 2020-04-13 MED ORDER — POTASSIUM CHLORIDE 10 MEQ/100ML IV SOLN: 10.0000 meq | INTRAVENOUS | Status: AC

## 2020-04-13 MED ORDER — METOCLOPRAMIDE HCL 5 MG/ML IJ SOLN
5.0000 mg | Freq: Two times a day (BID) | INTRAMUSCULAR | Status: DC
  Administered 2020-04-13 – 2020-04-14 (×3): 5 mg via INTRAVENOUS
  Filled 2020-04-13 (×3): qty 2

## 2020-04-13 NOTE — Plan of Care (Signed)
  Problem: Health Behavior/Discharge Planning: Goal: Ability to manage health-related needs will improve Outcome: Progressing   Problem: Activity: Goal: Risk for activity intolerance will decrease Outcome: Progressing   Problem: Nutritional: Goal: Maintenance of adequate nutrition will improve Outcome: Progressing

## 2020-04-13 NOTE — Progress Notes (Signed)
Occupational Therapy Treatment Patient Details Name: Deborah Figueroa MRN: [id number] DOB: 1955-12-12 Today's Date: 04/13/2020    History of present illness 65 year old woman presenting with diabetic ketoacidosis and acute renal failure found to have MSSA bacteremia of unclear etiology. PMH of HTN, HLD, and IDDM who presents the ED via EMS for weakness and hyperglycemia.  Pt s/p I and D left elbow infection and osteomyelitis 03/31/2020. Elbow stitches removed 1/20 and patient now with fistula.   OT comments  Pt received with c/o nausea and vomiting with RN at bedside. Agreeable 2nd attempt to address oral care and clean up at bed level with set up and min A to assist with opening container to brush teeth due to R hand weakness and edema. Therex of RUE for improved ROM and use of RUE for ADLs., LUE limited due to IV. RUE elevated to decreased edema. Continue with POC to ensure improved ADL function and strength prior to DC.    Follow Up Recommendations  SNF;Supervision/Assistance - 24 hour    Equipment Recommendations  Other (comment) (TBD)    Recommendations for Other Services PT consult    Precautions / Restrictions Precautions Precautions: Fall Precaution Comments: left elbow active use to tolerance per ortho notes, may not obtain tricep activation (L) per ortho note.       Mobility Bed Mobility Overal bed mobility: Needs Assistance             General bed mobility comments: deferred this session, will further assess.  Transfers Overall transfer level: Needs assistance                    Balance Overall balance assessment: Needs assistance Sitting-balance support: Bilateral upper extremity supported;Feet supported Sitting balance-Leahy Scale: Poor Sitting balance - Comments: Pt performed some LE exercises   Standing balance support: Bilateral upper extremity supported Standing balance-Leahy Scale: Poor Standing balance comment: reliant on UE support                            ADL either performed or assessed with clinical judgement   ADL Overall ADL's : Needs assistance/impaired     Grooming: Set up;Wash/dry face;Sitting Grooming Details (indicate cue type and reason): pt received initially with vomitting this am, agreeable to oral care and washing face with set up to min A. Pt limited with R hand strength to manage items at bed level.                     Toileting- Clothing Manipulation and Hygiene: Total assistance;Bed level;+2 for physical assistance;+2 for safety/equipment         General ADL Comments: Pt only agreeable to bed level ADL. Two attempts for sessions due to some lack of motivation and c/o nausea/vomiting.     Vision   Vision Assessment?: No apparent visual deficits   Perception     Praxis      Cognition Arousal/Alertness: Awake/alert Behavior During Therapy: WFL for tasks assessed/performed Overall Cognitive Status: Within Functional Limits for tasks assessed Area of Impairment: Orientation;Memory;Following commands;Safety/judgement;Awareness;Problem solving                 Orientation Level: Disoriented to;Time;Situation;Place Current Attention Level: Sustained Memory: Decreased short-term memory Following Commands: Follows one step commands inconsistently;Follows one step commands with increased time Safety/Judgement: Decreased awareness of safety;Decreased awareness of deficits Awareness: Intellectual Problem Solving: Slow processing;Difficulty sequencing;Requires verbal cues;Requires tactile cues General Comments: improved motivation this session  however, still required encouragement and two attempts with session.        Exercises Exercises: General Upper Extremity General Exercises - Upper Extremity Shoulder Flexion: AROM;10 reps;Supine Elbow Flexion: AROM;Supine;10 reps Digit Composite Flexion: 10 reps;AROM   Shoulder Instructions       General Comments RUE elevated with  built up pillow to decrease swelling and with ther ex to improve ROM.    Pertinent Vitals/ Pain       Pain Assessment: Faces Faces Pain Scale: Hurts even more Pain Location: abdomen, peri area Pain Descriptors / Indicators: Grimacing;Discomfort Pain Intervention(s): Limited activity within patient's tolerance  Home Living                                          Prior Functioning/Environment              Frequency  Min 2X/week        Progress Toward Goals  OT Goals(current goals can now be found in the care plan section)  Progress towards OT goals: Not progressing toward goals - comment  Acute Rehab OT Goals Patient Stated Goal: To wash her hair OT Goal Formulation: With patient Time For Goal Achievement: 04/05/20 Potential to Achieve Goals: Good ADL Goals Pt Will Perform Grooming: with set-up;standing;sitting Pt Will Perform Lower Body Dressing: with min guard assist;sit to/from stand Pt Will Transfer to Toilet: with min guard assist;ambulating Additional ADL Goal #1: Pt will complete multistep cognition task with <3 errors for safe engagement in ADL/IADL.  Plan Discharge plan remains appropriate    Co-evaluation                 AM-PAC OT "6 Clicks" Daily Activity     Outcome Measure   Help from another person eating meals?: A Little Help from another person taking care of personal grooming?: A Little Help from another person toileting, which includes using toliet, bedpan, or urinal?: Total Help from another person bathing (including washing, rinsing, drying)?: A Lot Help from another person to put on and taking off regular upper body clothing?: A Lot Help from another person to put on and taking off regular lower body clothing?: Total 6 Click Score: 12    End of Session Equipment Utilized During Treatment: Other (comment) (stedy)  OT Visit Diagnosis: Other abnormalities of gait and mobility (R26.89);Muscle weakness (generalized)  (M62.81);Other symptoms and signs involving cognitive function;Pain Pain - Right/Left: Right Pain - part of body: Arm;Shoulder   Activity Tolerance Patient tolerated treatment well   Patient Left in bed;with call bell/phone within reach;with nursing/sitter in room   Nurse Communication Mobility status        Time: 1045-1105 OT Time Calculation (min): 20 min  Charges: OT General Charges $OT Visit: 1 Visit OT Treatments $Self Care/Home Management : 8-22 mins  Minus Breeding, MSOT, OTR/L  Supplemental Rehabilitation Services  [phone]    Marius Ditch 04/13/2020, 12:07 PM

## 2020-04-13 NOTE — Progress Notes (Signed)
PROGRESS NOTE   Deborah Figueroa  WJX:[id number] DOB: 04-27-1955 DOA: 03/24/2020 PCP: Ann Held, DO  Brief Narrative:  65 year old white female DM TY 2 uncontrolled Chronic hypercalcemia 2/2 hypothyroidism Depression HTN HLD Admitted 03/22/2019 with DKA Found to have sepsis in setting of MSSA bacteremia left olecranon bursitis/osteomyelitis Orthopedics consulted underwent elbow I&D 1/10 infectious disease saw the patient recommended IV antibiotics for 6 weeks Patient developed acute respiratory failure due to hospital-acquired pneumonia as well as blood loss from epistaxis and positive Hemoccult Had EGD and colonoscopy 1/28 showing stercoral ulcer and gastric ulcer Awaiting placement at this time   Assessment & Plan:   Principal Problem:   MSSA bacteremia Active Problems:   Hypercalcemia   Depression, major, single episode, moderate (HCC)   DKA (diabetic ketoacidosis) ([place])   Acute pain of right shoulder   Left elbow pain   Acute kidney injury ([place])   Weakness   Recurrent falls   Sepsis due to methicillin susceptible Staphylococcus aureus ([place])   Osteomyelitis of left elbow (HCC)   Osteomyelitis of left ulna (HCC)   Sepsis ([place])   Blood in the stool   Anemia due to GI blood loss   Coagulopathy (HCC)   Duodenal ulcer   Acute gastric ulcer without hemorrhage or perforation   Adenomatous polyp of ascending colon   Adenomatous polyp of descending colon   Stercoral ulcer of rectum   Fecal impaction ([place])   1. MSSA bacteremia chronic olecranon bursitis and phlegmon a. Stop date for cefazolin appears to be through 05/05/2020-TEE negative for endocarditis-repeat blood culture 1/11  b. Per ID needs minimum 4 weeks IV prior to consideration for change to oral c.   We will reach out to infectious disease in the next several weeks to see when she can be transitioned 2. Nausea vomiting since 1/30 a. Unclear if related to ulcer b. Stop nonessential meds like cholesterol  medications start Reglan every 12 5 mg and see if this makes a difference back down to clear liquid diet c. If no better will need to speak to GI again 3. Severe hypokalemia a. Replace orally with 40 mg 3 times daily today b. Give potassium IV in addition to the above p.o. c. Magnesium replaced with IV magnesium 2 g 4. Acute metabolic encephalopathy a. Seems to have resolved-MRI brain this admission negative 5. Oropharyngeal thrush a. Add fluconazole to stop on 04/18/2018 20 to 10 days, change Magic mouthwash to nystatin b. Still having nausea as above 6. Hypoxemic respiratory failure secondary to HCAP a. Desat to 87% previously b. Treated with broad-spectrum antibiotics and now just treating as above for MSSA 7. Right apical mass a. Outpatient further characterization b. We will need to be set up once discharged in the outpatient setting with pulmonary 8. 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 Metabolic acidosis a. Baseline creatinine prior to this admit about 1.5 back in 2018 b. Avoid nephrotoxins c. Continue bicarb 3 times daily for metabolic acidosis 10. Diabetes mellitus with complications of DKA on admit-currently hypoglycemic a. DKA physiology resolved b. Patient hardly eating discontinue scheduled insulin 1/31 c. Continue Lantus and sliding scale only d. Give juice if sugar drops low 11. Severe hypercalcemia a. Likely secondary to DKA and normalized 12. ?  Rectovaginal fistula a. Outpatient characterization 13. Acute on chronic normocytic anemia 14. Epistaxis coagulopathy a. Stable at this time b. Scope as below adding Carafate given mild vomiting on 1/29 continue PPI twice daily per GI c. Pathology  on colonoscopy shows tubular adenoma without malignant potential and endoscopy shows no malignancy 15. Superficial DVT right cephalic vein a. Not a candidate for anticoagulation 16. Depression a. Continue Zoloft 100 daily  DVT prophylaxis: Lovenox Code Status:  Full Family Communication:  Disposition:  Status is: Inpatient  Remains inpatient appropriate because:Hemodynamically unstable, Ongoing diagnostic testing needed not appropriate for outpatient work up, Unsafe d/c plan and IV treatments appropriate due to intensity of illness or inability to take PO   Dispo: The patient is from: Home              Anticipated d/c is to: Unknown              Anticipated d/c date is: 3 days              Patient currently is not medically stable to d/c.   Difficult to place patient No       Consultants:   ID  GI   Procedures:  Impression:               - Linear esophageal ulcer. Biopsied.                           - Non-bleeding gastric ulcer with no stigmata of                            bleeding. Biopsied.                           - Erosive gastropathy with no bleeding and no                            stigmata of recent bleeding.                           - Non-bleeding duodenal ulcers with pigmented                            material.                           - The examination was otherwise normal.  Antimicrobials: Currently cefazolin, fluconazole   Subjective:  2 episodes of vomiting unable to keep stuff down, feels like she has a lot of mucus in the back of her throat No chest pain no fever Overall does not feel well   Objective: Vitals:   04/12/20 1246 04/12/20 1300 04/12/20 2035 04/13/20 0415  BP: (!) 174/95 (!) 148/82 (!) 156/84 (!) 149/87  Pulse: 79  78 75  Resp: 20  20 18   Temp: 97.7 F (36.5 C)  98.1 F (36.7 C) 97.7 F (36.5 C)  TempSrc: Oral   Oral  SpO2: 98%  97% 96%  Weight:      Height:        Intake/Output Summary (Last 24 hours) at 04/13/2020 1226 Last data filed at 04/13/2020 0400 Gross per 24 hour  Intake 440 ml  Output 1371 ml  Net -931 ml   Filed Weights   04/06/2020 0952 04/01/2020 1400  Weight: 93.9 kg 93.9 kg    Examination:  Pleasant no thrush no icterus no pallor Thick neck CTA B no  added sound Left upper extremity moves  well without deficit but right upper extremity is slightly painful and antalgic Abdomen soft nontender Neurologically intact moving all 4 limbs equally Grade 1 lower extremity edema   Data Reviewed: I have personally reviewed following labs and imaging studies  Potassium 2.8 bicarb 18 BUN/creatinine 47/2.1-->49/2.2 Magnesium 1.5 LFTs low  White count 12.7 hemoglobin 7.1  COVID-19 Labs  No results for input(s): DDIMER, FERRITIN, LDH, CRP in the last 72 hours.  Lab Results  Component Value Date   [place] NEGATIVE 03/20/2020     Radiology Studies: No results found.   Scheduled Meds: . (feeding supplement) PROSource Plus  30 mL Oral TID BM  . atorvastatin  40 mg Oral Daily  . docusate sodium  100 mg Oral BID  . feeding supplement  1 Container Oral TID BM  . fenofibrate  160 mg Oral Daily  . fluconazole  100 mg Oral Daily  . insulin aspart  0-15 Units Subcutaneous Q4H  . insulin aspart  3 Units Subcutaneous TID WC  . insulin glargine  9 Units Subcutaneous QHS  . melatonin  3 mg Oral QHS  . metoCLOPramide (REGLAN) injection  5 mg Intravenous Q12H  . metoprolol succinate  100 mg Oral Daily  . nystatin  5 mL Oral QID  . nystatin   Topical TID  . pantoprazole  40 mg Oral BID  . polyethylene glycol  17 g Oral Daily  . potassium chloride  40 mEq Oral TID  . sertraline  100 mg Oral Daily  . sodium bicarbonate  650 mg Oral TID  . sucralfate  1 g Oral TID WC & HS   Continuous Infusions: .  ceFAZolin (ANCEF) IV 2 g (04/13/20 3833)  . potassium chloride       LOS: 23 days    Time spent: 25  Nita Sells, MD [facility] To contact the attending provider between 7A-7P or the covering provider during after hours 7P-7A, please log into the web site [email or web address] and access using universal [place] password for that web site. If you do not have the password, please call the hospital operator.  04/13/2020, 12:26 PM

## 2020-04-13 NOTE — Progress Notes (Signed)
Pt has been adamantly refusing turning/mobility this shift. Educated patient on risks for skin breakdown among other things. Pt restated her refusal and stated "a little later". Plan formed with patient to wait one more hour and then turn. Pt agreeable.  Coolidge Breeze, RN 04/13/2020

## 2020-04-13 NOTE — TOC Progression Note (Signed)
Transition of Care [facility]) - Progression Note    Patient Details  Name: Deborah Figueroa MRN: [id number] Date of Birth: 08/07/1955  Transition of [facility]) CM/SW Contact  Ross Ludwig, [place] Phone Number: 04/13/2020, 10:17 AM  Clinical Narrative:     CSW spoke to patient and talked to her about her insurance.  Per patient she states that she does have insurance through [facility].  SNF tried to check on patient's insurance, per SNF, she does not have insurance.  CSW contacted registration, and they verified that patient does not have any insurance.  CSW spoke to patient and she stated she does not understand why there is no insurance.  CSW asked patient what her plan is after discharge, and she stated, I'm self-sufficient and will figure something out.  CSW asked if she has spoken to DSS or [facility] and she said she already tried them and does not want CSW to help out.  Patient was asked if she had any family she said no and she does not despite faceshheet saying she does.  Patient does not wan it.  Expected Discharge Plan: Skilled Nursing Facility Barriers to Discharge: Continued Medical Work up  Expected Discharge Plan and Services Expected Discharge Plan: [place]   Discharge Planning Services: CM Consult                                           Social Determinants of Health (SDOH) Interventions    Readmission Risk Interventions No flowsheet data found.

## 2020-04-13 NOTE — Progress Notes (Signed)
Deborah Figueroa   ATF:5[birth year]2   RK#:[id number]   GBT#:[id number]  Assessment and Plan  ASSESSMENT & PLAN:  No problem-specific Assessment & Plan notes found for this encounter.  1. Normocytic normochromic anemia. Suspect blood loss, no evidence of hemolysis or overt DIC Mild coagulopathy likey from ongoing infection,  Mixing studies confirm mild coagulopathy, which has spontaneously improved. Mixing studies and factor essays reviewed, no major concerns. Anemia likely from intermittent GI bleed vs Bone marrow suppression Recommend folic acid supplementation.  2. Right lung mass. She has no memory of our discussion which is very surprising I suggested to speak to her son but she didn't want me to call him. Please consider inpatient biopsy since disposition remains unknown at this time and since patient is not bacteremic. Would recommend completing staging with CT abdomen and pelvis. MR imaging of brain without any evidence of metastatic disease. If no evidence of metastatic disease on CT abdomen and CT biopsy of lung lesion confirms primary lung carcinoma, can consider surgery consult.  We will arrange for FU outpatient since chemotherapy wont be the primary treatment for this patient unless metastatic disease is found.  Subjective:   Pt is sitting up in bed. She appears sad but overall says the scopes were hard on her. She is recovering from the scope. She cant seem to remember anything about the lung mass. She denies any change in her breathing. No new leg swelling.  Objective:  Vitals:   04/13/20 0415 04/13/20 1412  BP: (!) 149/87 (!) 149/79  Pulse: 75 76  Resp: 18 20  Temp: 97.7 F (36.5 C) 97.8 F (36.6 C)  SpO2: 96% 96%    Body mass index is 28.87 kg/m.  Intake/Output Summary (Last 24 hours) at 04/13/2020 1704 Last data filed at 04/13/2020 0400 Gross per 24 hour  Intake 320 ml  Output 721 ml  Net -401 ml     Sclerae unicteric  Oropharynx clear  No  peripheral adenopathy  Lungs clear -- no rales or rhonchi  Heart regular rate and rhythm  Abdomen benign  BLE 2+ edema.  CBG (last 3)  Recent Labs    04/13/20 0804 04/13/20 1217 04/13/20 1635  GLUCAP 93 89 108*     Labs:  Lab Results  Component Value Date   WBC 12.7 (H) 04/13/2020   HGB 7.1 (L) 04/13/2020   HCT 22.5 (L) 04/13/2020   MCV 87.2 04/13/2020   PLT 188 04/13/2020   NEUTROABS 11.0 (H) 04/13/2020     Urine Studies No results for input(s): UHGB, CRYS in the last 72 hours.  Invalid input(s): UACOL, UAPR, USPG, UPH, UTP, UGL, UKET, UBIL, UNIT, UROB, ULEU, UEPI, UWBC, Junie Panning [place], [place], [place]  Basic Metabolic Panel: Recent Labs  Lab 04/07/20 0553 04/08/20 0559 04/09/20 0535 04/12/2020 0557 03/23/2020 1248 04/11/20 0555 04/13/20 0521  NA 137 140 141 143  --  143 142  K 3.6 3.5 3.4* 2.6*   < > 3.0* 2.8*  CL 112* 111 112* 113*  --  114* 112*  CO2 14* 14* 16* 15*  --  15* 18*  GLUCOSE 104* 96 110* 135*  --  188* 102*  BUN 59* 61* 53* 52*  --  47* 49*  CREATININE 2.43* 2.40* 2.33* 2.18*  --  2.12* 2.29*  CALCIUM 9.1 9.3 9.5 9.4  --  9.8 10.5*  MG 1.4* 1.9  --   --   --  1.5* 1.6*  PHOS 3.6 4.5  --   --   --   --   --    < > =  values in this interval not displayed.   GFR Estimated Creatinine Clearance: 31.3 mL/min (A) (by C-G formula based on SCr of 2.29 mg/dL (H)). Liver Function Tests: Recent Labs  Lab 04/08/20 0559 04/09/20 0535 04/09/2020 0557 04/11/20 0555 04/13/20 0521  AST 20 17 13* 12* 13*  ALT <5 <5 <5 <5 <5  ALKPHOS 77 73 69 68 71  BILITOT 0.6 0.6 0.5 0.6 0.3  PROT 6.0* 6.0* 5.6* 5.6* 5.7*  ALBUMIN 1.5* 1.5* 1.5* 1.5* 1.4*   No results for input(s): LIPASE, AMYLASE in the last 168 hours. No results for input(s): AMMONIA in the last 168 hours. Coagulation profile Recent Labs  Lab 04/07/20 0553 04/08/20 0559 03/23/2020 1708 03/31/2020 1841  INR 1.4* 1.4* NOT CALCULATED 1.3*    CBC: Recent Labs  Lab 04/08/20 0559 04/09/20 0535  03/20/2020 0557 04/11/20 0555 04/13/20 0521  WBC 9.8 9.6 9.3 10.1 12.7*  NEUTROABS  --  7.9* 7.7 8.4* 11.0*  HGB 7.7* 7.5* 7.1* 7.3* 7.1*  HCT 24.0* 24.2* 23.3* 23.9* 22.5*  MCV 87.0 89.3 90.0 89.8 87.2  PLT 215 219 217 209 188   Cardiac Enzymes: No results for input(s): CKTOTAL, CKMB, CKMBINDEX, TROPONINI in the last 168 hours. BNP: Invalid input(s): POCBNP CBG: Recent Labs  Lab 04/13/20 0004 04/13/20 0451 04/13/20 0804 04/13/20 1217 04/13/20 1635  GLUCAP 153* 96 93 89 108*   D-Dimer No results for input(s): DDIMER in the last 72 hours. Hgb A1c No results for input(s): HGBA1C in the last 72 hours. Lipid Profile No results for input(s): CHOL, HDL, LDLCALC, TRIG, CHOLHDL, LDLDIRECT in the last 72 hours. Thyroid function studies No results for input(s): TSH, T4TOTAL, T3FREE, THYROIDAB in the last 72 hours.  Invalid input(s): FREET3 Anemia work up No results for input(s): VITAMINB12, FOLATE, FERRITIN, TIBC, IRON, RETICCTPCT in the last 72 hours. Microbiology No results found for this or any previous visit (from the past 240 hour(s)).  Reviewed c  Studies:  No results found.   PT corrected on mixing study suggesting some deficiency PTT almost normal by the time we sent for mixing study. Factor levels normal.  Benay Pike, MD 04/13/2020  5:04 PM

## 2020-04-13 NOTE — Progress Notes (Signed)
Pt states she does not like the [employer]. She would prefer a supplemental drink that was chocolate or vanilla. She is also unable to tolerate most doses of the Prosource. She would like to speak with a dietician to see whether she could find some other supplements that would be more tolerable for her.  RN paged dietician, awaiting call back.  Coolidge Breeze, RN 04/13/2020

## 2020-04-14 ENCOUNTER — Inpatient Hospital Stay (HOSPITAL_COMMUNITY): Payer: Medicaid Other

## 2020-04-14 ENCOUNTER — Inpatient Hospital Stay: Payer: MEDICAID | Admitting: Internal Medicine

## 2020-04-14 ENCOUNTER — Inpatient Hospital Stay: Payer: MEDICAID

## 2020-04-14 LAB — CBC WITH DIFFERENTIAL/PLATELET
Abs Immature Granulocytes: 0.49 10*3/uL — ABNORMAL HIGH (ref 0.00–0.07)
Basophils Absolute: 0 10*3/uL (ref 0.0–0.1)
Basophils Relative: 0 %
Eosinophils Absolute: 0 10*3/uL (ref 0.0–0.5)
Eosinophils Relative: 0 %
HCT: 21.9 % — ABNORMAL LOW (ref 36.0–46.0)
Hemoglobin: 6.8 g/dL — CL (ref 12.0–15.0)
Immature Granulocytes: 4 %
Lymphocytes Relative: 4 %
Lymphs Abs: 0.5 10*3/uL — ABNORMAL LOW (ref 0.7–4.0)
MCH: 27.5 pg (ref 26.0–34.0)
MCHC: 31.1 g/dL (ref 30.0–36.0)
MCV: 88.7 fL (ref 80.0–100.0)
Monocytes Absolute: 0.4 10*3/uL (ref 0.1–1.0)
Monocytes Relative: 3 %
Neutro Abs: 11 10*3/uL — ABNORMAL HIGH (ref 1.7–7.7)
Neutrophils Relative %: 89 %
Platelets: 173 10*3/uL (ref 150–400)
RBC: 2.47 MIL/uL — ABNORMAL LOW (ref 3.87–5.11)
RDW: 18.3 % — ABNORMAL HIGH (ref 11.5–15.5)
WBC: 12.3 10*3/uL — ABNORMAL HIGH (ref 4.0–10.5)
nRBC: 0 % (ref 0.0–0.2)

## 2020-04-14 LAB — COMPREHENSIVE METABOLIC PANEL
ALT: <5 U/L (ref 0–44)
AST: 13 U/L — ABNORMAL LOW (ref 15–41)
Albumin: 1.4 g/dL — ABNORMAL LOW (ref 3.5–5.0)
Alkaline Phosphatase: 68 U/L (ref 38–126)
Anion gap: 12 (ref 5–15)
BUN: 46 mg/dL — ABNORMAL HIGH (ref 8–23)
CO2: 17 mmol/L — ABNORMAL LOW (ref 22–32)
Calcium: 10 mg/dL (ref 8.9–10.3)
Chloride: 112 mmol/L — ABNORMAL HIGH (ref 98–111)
Creatinine, Ser: 2.35 mg/dL — ABNORMAL HIGH (ref 0.44–1.00)
GFR, Estimated: 23 mL/min — ABNORMAL LOW (ref >60)
Glucose, Bld: 115 mg/dL — ABNORMAL HIGH (ref 70–99)
Potassium: 3.4 mmol/L — ABNORMAL LOW (ref 3.5–5.1)
Sodium: 141 mmol/L (ref 135–145)
Total Bilirubin: 0.7 mg/dL (ref 0.3–1.2)
Total Protein: 5.6 g/dL — ABNORMAL LOW (ref 6.5–8.1)

## 2020-04-14 LAB — GLUCOSE, CAPILLARY
Glucose-Capillary: 104 mg/dL — ABNORMAL HIGH (ref 70–99)
Glucose-Capillary: 104 mg/dL — ABNORMAL HIGH (ref 70–99)
Glucose-Capillary: 110 mg/dL — ABNORMAL HIGH (ref 70–99)
Glucose-Capillary: 114 mg/dL — ABNORMAL HIGH (ref 70–99)
Glucose-Capillary: 96 mg/dL (ref 70–99)
Glucose-Capillary: 98 mg/dL (ref 70–99)

## 2020-04-14 LAB — PROCALCITONIN: Procalcitonin: 0.39 ng/mL

## 2020-04-14 LAB — PREPARE RBC (CROSSMATCH)

## 2020-04-14 MED ORDER — ACETAMINOPHEN 325 MG PO TABS
650.0000 mg | ORAL_TABLET | Freq: Once | ORAL | Status: AC
  Administered 2020-04-14: 650 mg via ORAL
  Filled 2020-04-14: qty 2

## 2020-04-14 MED ORDER — SODIUM CHLORIDE 0.9% IV SOLUTION: Freq: Once | INTRAVENOUS | Status: AC

## 2020-04-14 MED ORDER — METOCLOPRAMIDE HCL 5 MG/ML IJ SOLN
5.0000 mg | Freq: Three times a day (TID) | INTRAMUSCULAR | Status: DC
  Administered 2020-04-14 – 2020-04-15 (×3): 5 mg via INTRAVENOUS
  Filled 2020-04-14 (×3): qty 2

## 2020-04-14 MED ORDER — FUROSEMIDE 10 MG/ML IJ SOLN
20.0000 mg | Freq: Once | INTRAMUSCULAR | Status: AC
  Administered 2020-04-14: 20 mg via INTRAVENOUS
  Filled 2020-04-14: qty 2

## 2020-04-14 MED ORDER — DIPHENHYDRAMINE HCL 25 MG PO CAPS
25.0000 mg | ORAL_CAPSULE | Freq: Once | ORAL | Status: AC
  Administered 2020-04-14: 25 mg via ORAL
  Filled 2020-04-14: qty 1

## 2020-04-14 MED ORDER — MILK AND MOLASSES ENEMA
1.0000 | Freq: Once | RECTAL | Status: AC
  Administered 2020-04-15: 240 mL via RECTAL
  Filled 2020-04-14: qty 240

## 2020-04-14 NOTE — Progress Notes (Signed)
Attempted to give patient her ordered milk and molasses enema x2, she refused. Patient stated concern that she would be up all night. I provided education about the enema and the importance of it, she has agreed to get the enema in the morning after breakfast.   Ms. [name], RN

## 2020-04-14 NOTE — Progress Notes (Signed)
CRITICAL VALUE ALERT  Critical Value:  Hemoglobin 6.8  Date & Time Notied:  04/14/20 @0633   Provider Notified: X. Blount  Orders Received/Actions taken: Awaiting new orders

## 2020-04-14 NOTE — Progress Notes (Signed)
Patient swallowed her evening dose of Protonix and shortly after began to feel nauseous and vomited 100 mL, patient then stated that this is why she does not like to swallow her pills, that it makes her feel sick and then she throws up.  [name], RN

## 2020-04-14 NOTE — Progress Notes (Signed)
PROGRESS NOTE   Deborah Figueroa  JJH:[id number] DOB: Feb 04, 1956 DOA: 03/28/2020 PCP: Ann Held, DO  Brief Narrative:  65 year old white female DM TY 2 uncontrolled Chronic hypercalcemia 2/2 hypothyroidism Depression HTN HLD Admitted 03/22/2019 with DKA Found to have sepsis in setting of MSSA bacteremia left olecranon bursitis/osteomyelitis Orthopedics consulted underwent elbow I&D 1/10 infectious disease saw the patient recommended IV antibiotics for 6 weeks Patient developed acute respiratory failure due to hospital-acquired pneumonia as well as blood loss from epistaxis and positive Hemoccult Had EGD and colonoscopy 1/28 showing stercoral ulcer and gastric ulcer Awaiting placement at this time   Assessment & Plan:   Principal Problem:   MSSA bacteremia Active Problems:   Hypercalcemia   Depression, major, single episode, moderate (HCC)   DKA (diabetic ketoacidosis) ([place])   Acute pain of right shoulder   Left elbow pain   Acute kidney injury ([place])   Weakness   Recurrent falls   Sepsis due to methicillin susceptible Staphylococcus aureus ([place])   Osteomyelitis of left elbow (HCC)   Osteomyelitis of left ulna (HCC)   Sepsis ([place])   Blood in the stool   Anemia due to GI blood loss   Coagulopathy (HCC)   Duodenal ulcer   Acute gastric ulcer without hemorrhage or perforation   Adenomatous polyp of ascending colon   Adenomatous polyp of descending colon   Stercoral ulcer of rectum   Fecal impaction ([place])   1. MSSA bacteremia chronic olecranon bursitis and phlegmon a. Stop date for cefazolin appears to be through 05/05/2020-TEE negative for endocarditis-repeat blood culture 1/11  b. Per ID needs minimum 4 weeks IV prior to consideration for change to oral c.  We will reach out to infectious disease in the next several weeks to see when she can be transitioned 2. Nausea vomiting since 1/30 a. Unclear if related to ulcer-trying to give enema today to see if clears  her out as this may be part of why she is nauseous has not had a stool recently  b. Scope as below adding Carafate given mild vomiting on 1/29 continue PPI twice daily per GI c. Stop nonessential meds like cholesterol meds-increase Reglan to 3 times daily d.  slight improvement today-if does not tolerate soft diet graduation 2/1 consult GI again 3. Severe hypokalemia a. Replaced with IV potassium 1/31 b. recheck magnesium again tomorrow 4. Acute metabolic encephalopathy a. Seems to have resolved-MRI brain this admission negative 5. Oropharyngeal thrush a. Add fluconazole to stop on 04/18/2020 to 10 days, change Magic mouthwash to nystatin b. Still having nausea as above 6. Hypoxemic respiratory failure secondary to HCAP a. Desat to 87% previously b. Treated with broad-spectrum antibiotics and now just treating as above for MSSA 7. Right apical mass 8. ?  Pneumonia a. Outpatient further characterization b. We will need to be set up once discharged in the outpatient setting with pulmonary c. She has increasing opacity and may be pleural effusion on chest x-ray 2/1 so obtain limited CT without contrast given her coughing and nausea to make sure that there is no further issue 9. 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 Metabolic acidosis a. Baseline creatinine prior to this admit about 1.5 back in 2018 b. Avoid nephrotoxins c. Continue bicarb 3 times daily for metabolic acidosis 11. Diabetes mellitus with complications of DKA on admit-currently hypoglycemic a. DKA physiology resolved b. Patient hardly eating discontinue scheduled insulin 1/31 c. Continue Lantus and sliding scale only, no scheduled insulin d. Blood  sugars 90s to 115 12. Severe hypercalcemia a. Likely secondary to DKA and normalized 13. ?  Rectovaginal fistula a. Outpatient characterization and will need discussion of the same 14. Acute on chronic normocytic anemia 15. Epistaxis coagulopathy a. Stable at this  time b. Pathology on colonoscopy shows tubular adenoma without malignant potential and endoscopy shows no malignancy c. transfusing 1 unit blood 04/14/2020 given Likely dilutional drop in hemoglobin 16. Superficial DVT right cephalic vein a. Not a candidate for anticoagulation 17. Depression a. Continue Zoloft 100 daily  DVT prophylaxis: Lovenox Code Status: Full Family Communication: No family present over several days Disposition:  Status is: Inpatient  Remains inpatient appropriate because:Hemodynamically unstable, Ongoing diagnostic testing needed not appropriate for outpatient work up, Unsafe d/c plan and IV treatments appropriate due to intensity of illness or inability to take PO   Dispo: The patient is from: Home              Anticipated d/c is to: Unknown              Anticipated d/c date is: 3 days              Patient currently is not medically stable to d/c.   Difficult to place patient No       Consultants:   ID  GI   Procedures:  Impression:               - Linear esophageal ulcer. Biopsied.                           - Non-bleeding gastric ulcer with no stigmata of                            bleeding. Biopsied.                           - Erosive gastropathy with no bleeding and no                            stigmata of recent bleeding.                           - Non-bleeding duodenal ulcers with pigmented                            material.                           - The examination was otherwise normal.  Antimicrobials: Currently cefazolin, fluconazole   Subjective:  Mild sputum this morning but tolerating some liquids continues to complain of sputum No abdominal pain no fever no chills no dysphagia   Objective: Vitals:   04/13/20 2112 04/14/20 0609 04/14/20 0948 04/14/20 1332  BP: (!) 142/78 135/82 (!) 153/83 (!) 152/82  Pulse: 83 83 78 81  Resp: 18 20 16 20   Temp: (!) 97.3 F (36.3 C) 98 F (36.7 C) 97.9 F (36.6 C) 97.9 F (36.6 C)   TempSrc:  Oral Oral Oral  SpO2: 96% 94% 99% 97%  Weight:      Height:        Intake/Output Summary (Last 24 hours) at 04/14/2020 1545 Last data filed at 04/14/2020 2725 Gross  per 24 hour  Intake 857.49 ml  Output 850 ml  Net 7.49 ml   Filed Weights   04/04/2020 0952 04/12/2020 1400  Weight: 93.9 kg 93.9 kg    Examination:  No thrush anicteric no pallor Thick neck CTA B no added sound Good ROM to both extremities Abdomen soft nontender Neurologically intact moving all 4 limbs equally Grade 1 lower extremity edema   Data Reviewed: I have personally reviewed following labs and imaging studies  Potassium 2.8-->3.4 Bicarb 17 BUN/creatinine 47/2.1-->49/2.2-->46/2.3  White count 12.3 hemoglobin 7.1-->6.8-->1 unit PRBC  COVID-19 Labs  No results for input(s): DDIMER, FERRITIN, LDH, CRP in the last 72 hours.  Lab Results  Component Value Date   [place] NEGATIVE 03/31/2020     Radiology Studies: DG CHEST PORT 1 VIEW  Result Date: 04/14/2020 CLINICAL DATA:  Pneumonia and hyperglycemia.  Post EGD 03/19/2020 EXAM: PORTABLE CHEST 1 VIEW COMPARISON:  120 match that 03/30/2020 FINDINGS: Heart size upper normal. Vascularity normal. Progressive left lower lobe consolidation with small left effusion. Mild right lower lobe airspace disease slightly improved. IMPRESSION: Progressive left lower lobe consolidation and left effusion. Mild right lower lobe airspace disease slightly improved. Electronically Signed   By: Franchot Gallo M.D.   On: 04/14/2020 10:40     Scheduled Meds: . (feeding supplement) PROSource Plus  30 mL Oral TID BM  . sodium chloride   Intravenous Once  . acetaminophen  650 mg Oral Once  . diphenhydrAMINE  25 mg Oral Once  . feeding supplement  1 Container Oral TID BM  . fluconazole  100 mg Oral Daily  . furosemide  20 mg Intravenous Once  . insulin aspart  0-15 Units Subcutaneous Q4H  . insulin glargine  9 Units Subcutaneous QHS  . metoCLOPramide (REGLAN)  injection  5 mg Intravenous Q8H  . metoprolol succinate  100 mg Oral Daily  . milk and molasses  1 enema Rectal Once  . nystatin  5 mL Oral QID  . nystatin   Topical TID  . pantoprazole  40 mg Oral BID  . polyethylene glycol  17 g Oral Daily  . sertraline  100 mg Oral Daily  . sodium bicarbonate  650 mg Oral TID  . sucralfate  1 g Oral TID WC & HS   Continuous Infusions: .  ceFAZolin (ANCEF) IV 2 g (04/14/20 0820)     LOS: 24 days    Time spent: [place], MD [facility] To contact the attending provider between 7A-7P or the covering provider during after hours 7P-7A, please log into the web site [email or web address] and access using universal [place] password for that web site. If you do not have the password, please call the hospital operator.  04/14/2020, 3:45 PM

## 2020-04-14 NOTE — Progress Notes (Signed)
Attempted to administer milk and molasses enema but patient wanted to eat first. After eating patient was transported to radiology for a CT, she returned to unit around 1810. PRBC ordered and were ready, PRBC were started and patient was in agreeance to take the enema this evening will pass along to night shift RN and move enema to 2000. Will continue to monitor.

## 2020-04-14 DEATH — deceased

## 2020-04-15 ENCOUNTER — Encounter (HOSPITAL_COMMUNITY): Payer: Self-pay | Admitting: Internal Medicine

## 2020-04-15 DIAGNOSIS — I700 Atherosclerosis of aorta: Secondary | ICD-10-CM

## 2020-04-15 DIAGNOSIS — R918 Other nonspecific abnormal finding of lung field: Secondary | ICD-10-CM

## 2020-04-15 DIAGNOSIS — E041 Nontoxic single thyroid nodule: Secondary | ICD-10-CM

## 2020-04-15 DIAGNOSIS — K8689 Other specified diseases of pancreas: Secondary | ICD-10-CM

## 2020-04-15 HISTORY — DX: Other nonspecific abnormal finding of lung field: R91.8

## 2020-04-15 HISTORY — DX: Nontoxic single thyroid nodule: E04.1

## 2020-04-15 HISTORY — DX: Other specified diseases of pancreas: K86.89

## 2020-04-15 LAB — TYPE AND SCREEN
ABO/RH(D): A POS
Antibody Screen: NEGATIVE
Unit division: 0

## 2020-04-15 LAB — CBC WITH DIFFERENTIAL/PLATELET
Abs Immature Granulocytes: 0.38 10*3/uL — ABNORMAL HIGH (ref 0.00–0.07)
Basophils Absolute: 0 10*3/uL (ref 0.0–0.1)
Basophils Relative: 0 %
Eosinophils Absolute: 0.1 10*3/uL (ref 0.0–0.5)
Eosinophils Relative: 1 %
HCT: 26 % — ABNORMAL LOW (ref 36.0–46.0)
Hemoglobin: 8.3 g/dL — ABNORMAL LOW (ref 12.0–15.0)
Immature Granulocytes: 3 %
Lymphocytes Relative: 4 %
Lymphs Abs: 0.5 10*3/uL — ABNORMAL LOW (ref 0.7–4.0)
MCH: 28 pg (ref 26.0–34.0)
MCHC: 31.9 g/dL (ref 30.0–36.0)
MCV: 87.8 fL (ref 80.0–100.0)
Monocytes Absolute: 0.4 10*3/uL (ref 0.1–1.0)
Monocytes Relative: 4 %
Neutro Abs: 10.2 10*3/uL — ABNORMAL HIGH (ref 1.7–7.7)
Neutrophils Relative %: 88 %
Platelets: 180 10*3/uL (ref 150–400)
RBC: 2.96 MIL/uL — ABNORMAL LOW (ref 3.87–5.11)
RDW: 17.7 % — ABNORMAL HIGH (ref 11.5–15.5)
WBC: 11.6 10*3/uL — ABNORMAL HIGH (ref 4.0–10.5)
nRBC: 0 % (ref 0.0–0.2)

## 2020-04-15 LAB — GLUCOSE, CAPILLARY
Glucose-Capillary: 107 mg/dL — ABNORMAL HIGH (ref 70–99)
Glucose-Capillary: 74 mg/dL (ref 70–99)
Glucose-Capillary: 78 mg/dL (ref 70–99)
Glucose-Capillary: 86 mg/dL (ref 70–99)
Glucose-Capillary: 95 mg/dL (ref 70–99)
Glucose-Capillary: 99 mg/dL (ref 70–99)

## 2020-04-15 LAB — COMPREHENSIVE METABOLIC PANEL
ALT: <5 U/L (ref 0–44)
AST: 13 U/L — ABNORMAL LOW (ref 15–41)
Albumin: 1.6 g/dL — ABNORMAL LOW (ref 3.5–5.0)
Alkaline Phosphatase: 69 U/L (ref 38–126)
Anion gap: 14 (ref 5–15)
BUN: 46 mg/dL — ABNORMAL HIGH (ref 8–23)
CO2: 17 mmol/L — ABNORMAL LOW (ref 22–32)
Calcium: 9.9 mg/dL (ref 8.9–10.3)
Chloride: 109 mmol/L (ref 98–111)
Creatinine, Ser: 2.31 mg/dL — ABNORMAL HIGH (ref 0.44–1.00)
GFR, Estimated: 23 mL/min — ABNORMAL LOW (ref >60)
Glucose, Bld: 90 mg/dL (ref 70–99)
Potassium: 3.2 mmol/L — ABNORMAL LOW (ref 3.5–5.1)
Sodium: 140 mmol/L (ref 135–145)
Total Bilirubin: 0.4 mg/dL (ref 0.3–1.2)
Total Protein: 5.9 g/dL — ABNORMAL LOW (ref 6.5–8.1)

## 2020-04-15 LAB — PROCALCITONIN: Procalcitonin: 0.32 ng/mL

## 2020-04-15 LAB — BPAM RBC
Blood Product Expiration Date: 202202202359
ISSUE DATE / TIME: 202202011832
Unit Type and Rh: 6200

## 2020-04-15 LAB — MAGNESIUM: Magnesium: 1.7 mg/dL (ref 1.7–2.4)

## 2020-04-15 NOTE — Progress Notes (Signed)
PROGRESS NOTE  Deborah Figueroa HFW:[id number] DOB: 1955/07/16 DOA: 04/05/2020 PCP: Deborah Held, DO  Brief History   65 year old woman admitted 1/8 with DKA, found to have sepsis in the setting of MSSA bacteremia secondary to left olecranon bursitis, underwent incision and drainage per orthopedics, seen by infectious disease with plans for IV antibiotics for 6 weeks.  Developed acute respiratory failure secondary pneumonia as well as blood loss anemia from epistaxis.  Underwent EGD and colonoscopy showing stercoral ulcer and gastric ulcer.  Awaiting placement at this time.  A & P  MSSA bacteremia secondary to chronic olecranon bursitis and phlegmon --Continue antibiotics through 2/22 --Close follow-up with infectious disease for transition to oral antibiotics  Reported nausea vomiting.  Unclear what to make of this.  Abdominal exam is benign and the patient is eating.  Had results with Deborah Figueroa.  Follow clinically. --Continue PPI.  Pneumonia.  Radiographically still present but clinically appears resolved.  No hypoxia.  DKA on admission, diabetes mellitus type 2 --CBG stable.  Continue insulin.  Left pleural effusion Possible right apical lung mass: Possible pathologic fracture T1 transverse process, abnormal appearance T7 and T8 vertebral bodies concerning for metastatic disease. --Contrast-enhanced cervical and thoracic spine MRI in the future  Abnormal appearance of the pancreas, concerning for pancreatic tail mass --Follow-up with contrast-enhanced CT of abdomen  Left-sided thyroid nodule --Recommend outpatient ultrasound  Aortic atherosclerosis --No treatment indicated.  Nutritional Assessment: Body mass index is 28.87 kg/m.[place] Kitchen Seen by dietician.  I agree with the assessment and plan as outlined below: Nutrition Status: Nutrition Problem: Increased nutrient needs Etiology: acute illness (sepsis) Signs/Symptoms: estimated needs Interventions: Boost  Breeze,Prostat,Refer to RD note for recommendations  Disposition Plan:  Discussion: Complex case, plan as above  Status is: Inpatient  Remains inpatient appropriate because:Unsafe d/c plan   Dispo: The patient is from: Home              Anticipated d/c is to: uncertain              Anticipated d/c date is: > 3 days              Patient currently is not medically stable to d/c.   Difficult to place patient Yes  DVT prophylaxis: SCDs Start: 03/16/2020 1608   Code Status: Full Code Level of care: Telemetry Family Communication: none  Deborah Hodgkins, MD  [facility] Direct contact: see [email or web address] (further directions at bottom of note if needed) 7PM-7AM contact night coverage as at bottom of note 04/15/2020, 9:07 PM  LOS: 25 days   Significant Hospital Events   .    Consults:  .    Procedures:  .   Significant Diagnostic Tests:  [place] Kitchen    Micro Data:  .    Antimicrobials:  .   Interval History/Subjective  CC: f/u infection  Feels ok, had BM. Vomited this AM. However tolerated food after that. Left elbow feels fine.  Objective   Vitals:  Vitals:   04/15/20 0443 04/15/20 1300  BP: (!) 154/82 (!) 149/87  Pulse: 72 73  Resp: 18 20  Temp: 97.6 F (36.4 C) 98.3 F (36.8 C)  SpO2: 99% 99%    Exam:  Constitutional:   . Appears calm and comfortable ENMT:  . grossly normal hearing  Respiratory:  . CTA bilaterally, no w/r/r.  . Respiratory effort normal.  Cardiovascular:  . RRR, no m/r/g . 2+ BLE extremity edema   Skin:  . Left elbow incision well-healed Psychiatric:  .  Mental status o Mood appears normal, affect odd  I have personally reviewed the following:   Today's Data  . CBG stable . K= 3.2 . Creatinine stable 2.31 . Hgb up to 8.3 s/p PRBC  Scheduled Meds: . (feeding supplement) PROSource Plus  30 mL Oral TID BM  . feeding supplement  1 Container Oral TID BM  . fluconazole  100 mg Oral Daily  . insulin aspart  0-15 Units Subcutaneous  Q4H  . insulin glargine  9 Units Subcutaneous QHS  . metoprolol succinate  100 mg Oral Daily  . nystatin  5 mL Oral QID  . nystatin   Topical TID  . pantoprazole  40 mg Oral BID  . polyethylene glycol  17 g Oral Daily  . sertraline  100 mg Oral Daily  . sodium bicarbonate  650 mg Oral TID  . sucralfate  1 g Oral TID WC & HS   Continuous Infusions: .  ceFAZolin (ANCEF) IV 2 g (04/15/20 1658)    Principal Problem:   MSSA bacteremia Active Problems:   Hypercalcemia   Depression, major, single episode, moderate (HCC)   DKA (diabetic ketoacidosis) (HCC)   Acute pain of right shoulder   Left elbow pain   Acute kidney injury ([place])   Weakness   Recurrent falls   Sepsis due to methicillin susceptible Staphylococcus aureus ([place])   Osteomyelitis of left elbow (HCC)   Osteomyelitis of left ulna (HCC)   Sepsis (HCC)   Blood in the stool   Anemia due to GI blood loss   Coagulopathy (HCC)   Duodenal ulcer   Acute gastric ulcer without hemorrhage or perforation   Adenomatous polyp of ascending colon   Adenomatous polyp of descending colon   Stercoral ulcer of rectum   Fecal impaction (HCC)   Lung mass   Thyroid nodule   Pancreatic mass   Aortic atherosclerosis ([place])   LOS: 25 days   How to contact the [facility] Attending or Consulting provider 7A - 7P or covering provider during after hours 7P -7A, for this patient?  1. Check the care team in [facility] and look for a) attending/consulting TRH provider listed and b) the [facility] team listed 2. Log into [email or web address] and use [place] universal password to access. If you do not have the password, please contact the hospital operator. 3. Locate the [facility] provider you are looking for under [facility] and page to a number that you can be directly reached. 4. If you still have difficulty reaching the provider, please page the [facility] (Director on Call) for the Hospitalists listed on amion for assistance.

## 2020-04-15 NOTE — Progress Notes (Signed)
Physical Therapy Treatment Patient Details Name: Deborah Figueroa MRN: [id number] DOB: 03-Apr-1955 Today's Date: 04/15/2020    History of Present Illness 65 year old woman presenting with diabetic ketoacidosis and acute renal failure found to have MSSA bacteremia of unclear etiology. PMH of HTN, HLD, and IDDM who presents the ED via EMS for weakness and hyperglycemia.  Pt s/p I and D left elbow infection and osteomyelitis 04/11/2020. Elbow stitches removed 1/20 and patient now with fistula.    PT Comments    Pt flat throughout session, frustrated with being in hospital and being nauseas. Pt initially declines PT due to "I'm in a procedure" then pt begins to explain how she is going to get an enema after breakfast. Therapist attempts to encourage pt to participate with therapy, educated pt on purpose and benefit of therapeutic interventions with questionable carryover. Pt agreeable to supine exercises, cues for motor control to decrease momentum, cues for technique to decrease compensations. Pt becomes nauseas with exercise, begins to spit into tray and emesis bag- RN notified. Pt declines to transfer to EOB throughout session despite education and encouragement. Educated pt to perform additional reps later this afternoon if she feels better and pt verbalizes understanding. Pt will benefit from continued physical therapy in hospital and recommendations below to increase strength, balance, endurance for safe ADLs and gait.    Follow Up Recommendations  SNF     Equipment Recommendations  None recommended by PT    Recommendations for Other Services       Precautions / Restrictions Precautions Precautions: Fall Precaution Comments: left elbow active use to tolerance per ortho notes, may not obtain tricep activation (L) per ortho note. Restrictions Weight Bearing Restrictions: No    Mobility  Bed Mobility  General bed mobility comments: pt declined despite encouragement and  education  Transfers  General transfer comment: pt declined despite encouragement and education  Ambulation/Gait      Stairs             Wheelchair Mobility    Modified Rankin (Stroke Patients Only)       Balance               Cognition Arousal/Alertness: Awake/alert Behavior During Therapy: Flat affect Overall Cognitive Status: Within Functional Limits for tasks assessed  General Comments: Pt flat throughout treatment, requires encouragement to participate and education on therapeutic process to motivate      Exercises General Exercises - Lower Extremity Ankle Circles/Pumps: Supine;AROM;Strengthening;Both;20 reps Short Arc Quad: Supine;AROM;Strengthening;Both;10 reps Hip ABduction/ADduction: Supine;AROM;Strengthening;Both;10 reps    General Comments        Pertinent Vitals/Pain Pain Assessment: Faces Faces Pain Scale: Hurts little more Pain Location: stomach ache/nausea Pain Descriptors / Indicators: Aching Pain Intervention(s): Limited activity within patient's tolerance;Monitored during session;Premedicated before session    Home Living                      Prior Function            PT Goals (current goals can now be found in the care plan section) Acute Rehab PT Goals Patient Stated Goal: "stop being nauseas" PT Goal Formulation: With patient Time For Goal Achievement: 04/29/20 Potential to Achieve Goals: Fair Progress towards PT goals: Progressing toward goals (slowly)    Frequency    Min 2X/week      PT Plan Current plan remains appropriate    Co-evaluation              AM-PAC PT "6  Clicks" Mobility   Outcome Measure  Help needed turning from your back to your side while in a flat bed without using bedrails?: A Lot Help needed moving from lying on your back to sitting on the side of a flat bed without using bedrails?: A Lot Help needed moving to and from a bed to a chair (including a wheelchair)?: Total Help  needed standing up from a chair using your arms (e.g., wheelchair or bedside chair)?: Total Help needed to walk in hospital room?: Total Help needed climbing 3-5 steps with a railing? : Total 6 Click Score: 8    End of Session   Activity Tolerance: Other (comment) (limited by nausea) Patient left: in bed;with call bell/phone within reach;with bed alarm set Nurse Communication: Mobility status;Other (comment) (pt nauseas) PT Visit Diagnosis: Muscle weakness (generalized) (M62.81);Other abnormalities of gait and mobility (R26.89)     Time: 4585-9292 PT Time Calculation (min) (ACUTE ONLY): 17 min  Charges:  $Therapeutic Exercise: 8-22 mins                      Tori Cilicia Borden PT, DPT 04/15/20, 9:17 AM

## 2020-04-16 ENCOUNTER — Inpatient Hospital Stay: Payer: Self-pay | Admitting: Infectious Diseases

## 2020-04-16 ENCOUNTER — Inpatient Hospital Stay (HOSPITAL_COMMUNITY): Payer: Medicaid Other

## 2020-04-16 ENCOUNTER — Other Ambulatory Visit: Payer: Self-pay | Admitting: *Deleted

## 2020-04-16 LAB — COMPREHENSIVE METABOLIC PANEL
ALT: <5 U/L (ref 0–44)
AST: 10 U/L — ABNORMAL LOW (ref 15–41)
Albumin: 1.5 g/dL — ABNORMAL LOW (ref 3.5–5.0)
Alkaline Phosphatase: 72 U/L (ref 38–126)
Anion gap: 15 (ref 5–15)
BUN: 48 mg/dL — ABNORMAL HIGH (ref 8–23)
CO2: 16 mmol/L — ABNORMAL LOW (ref 22–32)
Calcium: 9.7 mg/dL (ref 8.9–10.3)
Chloride: 106 mmol/L (ref 98–111)
Creatinine, Ser: 2.31 mg/dL — ABNORMAL HIGH (ref 0.44–1.00)
GFR, Estimated: 23 mL/min — ABNORMAL LOW (ref >60)
Glucose, Bld: 112 mg/dL — ABNORMAL HIGH (ref 70–99)
Potassium: 2.9 mmol/L — ABNORMAL LOW (ref 3.5–5.1)
Sodium: 137 mmol/L (ref 135–145)
Total Bilirubin: 0.7 mg/dL (ref 0.3–1.2)
Total Protein: 6 g/dL — ABNORMAL LOW (ref 6.5–8.1)

## 2020-04-16 LAB — GLUCOSE, CAPILLARY
Glucose-Capillary: 105 mg/dL — ABNORMAL HIGH (ref 70–99)
Glucose-Capillary: 107 mg/dL — ABNORMAL HIGH (ref 70–99)
Glucose-Capillary: 112 mg/dL — ABNORMAL HIGH (ref 70–99)
Glucose-Capillary: 118 mg/dL — ABNORMAL HIGH (ref 70–99)
Glucose-Capillary: 142 mg/dL — ABNORMAL HIGH (ref 70–99)
Glucose-Capillary: 84 mg/dL (ref 70–99)

## 2020-04-16 LAB — PROCALCITONIN: Procalcitonin: 0.29 ng/mL

## 2020-04-16 MED ORDER — POTASSIUM CHLORIDE CRYS ER 20 MEQ PO TBCR
40.0000 meq | EXTENDED_RELEASE_TABLET | Freq: Once | ORAL | Status: AC
  Administered 2020-04-16: 40 meq via ORAL
  Filled 2020-04-16: qty 2

## 2020-04-16 MED ORDER — FLUCONAZOLE 100MG IVPB
100.0000 mg | INTRAVENOUS | Status: AC
  Administered 2020-04-17 – 2020-04-20 (×4): 100 mg via INTRAVENOUS
  Filled 2020-04-16 (×4): qty 50

## 2020-04-16 MED ORDER — FOLIC ACID 1 MG PO TABS
1.0000 mg | ORAL_TABLET | Freq: Every day | ORAL | Status: DC
  Administered 2020-04-16 – 2020-04-19 (×3): 1 mg via ORAL
  Filled 2020-04-16 (×4): qty 1

## 2020-04-16 MED ORDER — POLYETHYLENE GLYCOL 3350 17 G PO PACK
17.0000 g | PACK | Freq: Two times a day (BID) | ORAL | Status: DC
  Administered 2020-04-17 – 2020-04-19 (×5): 17 g via ORAL
  Filled 2020-04-16 (×5): qty 1

## 2020-04-16 MED ORDER — MAGNESIUM SULFATE 2 GM/50ML IV SOLN
2.0000 g | Freq: Once | INTRAVENOUS | Status: AC
  Administered 2020-04-16: 2 g via INTRAVENOUS
  Filled 2020-04-16: qty 50

## 2020-04-16 MED ORDER — GLUCERNA SHAKE PO LIQD
237.0000 mL | Freq: Three times a day (TID) | ORAL | Status: DC
  Administered 2020-04-18 (×2): 237 mL via ORAL
  Filled 2020-04-16 (×14): qty 237

## 2020-04-16 NOTE — Progress Notes (Signed)
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  Developed acute respiratory failure secondary pneumonia as well as blood loss anemia from epistaxis.  Underwent EGD and colonoscopy showing stercoral ulcer and gastric ulcer.  Imaging revealed lung mass concerning for malignancy, abnormal pancreatic mass, thyroid nodule.  SNF recommended but patient has no insurance and is homeless.  Difficult to place patient.  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 --Continue antibiotics through 2/22 --OT for obtain triceps activation.  Follow-up with [facility] as an outpatient.  Reported nausea vomiting.  Appears resolved, can occur with some medications.  Abdominal exam benign.  Follow clinically.  Diabetes mellitus type 2 with hemoglobin A1c 10.4. --CBG remains stable.  Continue insulin.  AKI superimposed on CKD stage IIIb -baseline unclear, but probably around 1.7. Creatinine remains in 2s.  Hypokalemia --Replete.  Possible right apical lung mass, possible pathologic fracture T1 transverse process, abnormal appearance T7 and T8 vertebral bodies concerning for metastatic disease. --Dr. Cyndia Skeeters discussed with Dr. Laurence Ferrari 1/24 for possible needle biopsy, he recommended PET scan referral to thoracic oncology conference after treatment of pneumonia before considering needle biopsy. --CT abdomen pelvis for staging will discuss with radiology if not on contrast study is  adequate --Contrast-enhanced cervical and thoracic spine MRI in the future rec per radiology, will discuss contrast administration with radiology --Recommendations from tumor board are to order MR of thoracic spine and CT bx of mass.  Dr. Julien Nordmann is aware of pt and will see her at an outpatient appt.  Abnormal appearance of the pancreas, concerning for pancreatic tail mass --Follow-up with contrast-enhanced CT of abdomen when able  Generalized weakness with recurrent falls in the setting of hypercalcemia, bacteremia and DKA.  MRI brain negative. --unsafe d/c, cannot care for self, no support, homeless, no insurance. No bed offers from SNF.  Left-sided thyroid nodule minimally suppressed TSH with low free T3 and T4 within normal limits. --Recommend outpatient ultrasound  Acute on chronic normocytic anemia, Hemoccult positive but no overt GI bleed.  Earlier in hospitalization had epistaxis and coagulopathy, gum bleeding. Has required multiple transfusions. EGD/colonoscopy unrevealing. Hematology without specific recs at this point. Bone marrow suppression suspected as well. --periodic CBC.   Aortic atherosclerosis --No treatment indicated.  Primary hyperparathyroidism.  Followed by endocrinology as an outpatient, noted to be a candidate for surgery prior to admission.   --Follow-up with ENT after completion of treatment for bacteremia.  Stercoral ulcer, fecal impaction noted 1/28.  At that time aggressive bowel regimen recommended to keep stool soft and prevent recurrence of fecal impaction.  Consider Carafate enemas for 6 weeks to treat rectal ulcer. --continue bowel regimen  Rectovaginal fistula?.  Stool noted from the vagina?.  Consider GYN evaluation as outpatient.  RESOLVED . Sepsis secondary to MSSA bacteremia  . DKA . Severe hypercalcemia superimposed on chronic I per calcium he is secondary to primary hyperparathyroidism. Followed by endocrinology as an outpatient. Treated with  bisphosphonate and calcitonin. . Acute hypoxemic respiratory failure with uncompensated respiratory acidosis secondary to bilateral lower lobe pneumonia .  Acute metabolic encephalopathy.  MRI brain no acute finding. . Oropharyngeal thrush  Nutritional Assessment: Body mass index is 28.87 kg/m.Marland Kitchen Seen by dietician.  I agree with the assessment and plan as outlined below: Nutrition Status: Nutrition Problem: Increased nutrient needs Etiology: acute illness (sepsis) Signs/Symptoms: estimated needs Interventions: Prostat,MVI,Glucerna shake  Disposition Plan:  Discussion: Complex case, plan as above  Status is: Inpatient  Remains inpatient appropriate because:Unsafe d/c plan   Dispo: The patient is from: Home              Anticipated d/c is to: uncertain              Anticipated d/c date is: > 3 days              Patient currently is not medically stable to d/c.   Difficult to place patient Yes  DVT prophylaxis: SCDs Start: 04/09/2020 1608   Code Status: Full Code Level of care: Telemetry Family Communication: none  Murray Hodgkins, MD  [facility] Direct contact: see [email or web address] (further directions at bottom of note if needed) 7PM-7AM contact night coverage as at bottom of note 04/16/2020, 7:02 PM  LOS: 26 days   Significant Hospital Events   . 1/8 admit for DKA, falls, AKI, hypercalcemia . 1/9 MSSA bacteremia, ID consult, ortho consult for elbow infection . 1/10 I&D . 1/13 ID signs off . 1/25 GI consult for anemia . 1/25 oncology consult   Consults:  . ID . Ortho . GI   Procedures:  . 1/10 I&D left septic olecranon bursitis including osteo of the olecranon . 1/28 colonoscopy.  Fecal impaction with associated stercoral ulcer.  Manual disimpaction.  Polyp. . 1/28 EGD.  Linear esophageal ulcer, nonbleeding gastric ulcer, erosive gastropathy, nonbleeding duodenal ulcers  Significant Diagnostic Tests:  . 1/11 TEE no endocarditis  Interval History/Subjective  CC:  f/u infection  Feels somewhat better today, she was able to eat somewhat today.  She was able to tolerate some potassium.  Might have some mild abdominal pain.  Left arm is doing well.  Reports pain in her right wrist which has been present since her fall.  Breathing fine.  Objective   Vitals:  Vitals:   04/16/20 0942 04/16/20 1306  BP: (!) 148/72 (!) 142/81  Pulse: 74 83  Resp:  20  Temp:  97.7 F (36.5 C)  SpO2:  100%    Exam:  Constitutional:   . Appears calm and comfortable ENMT:  . grossly normal hearing  Respiratory:  . CTA bilaterally, no w/r/r.  . Respiratory effort normal. Cardiovascular:  . RRR, no m/r/g . No LE extremity edema   Musculoskeletal:  . Right hand w/ mild swelling, wrist tender  Skin:  . No rashes, lesions, ulcers . palpation of skin: no induration or nodules Psychiatric:  . Mental status o Mood appropriate, affect odd   I have personally reviewed the following:   Today's Data  . CBG stable . Potassium 2.9 . Creatinine 2.3  Scheduled Meds: . (feeding supplement) PROSource Plus  30 mL Oral TID BM  . feeding supplement (GLUCERNA SHAKE)  237 mL Oral TID BM  . folic acid  1 mg Oral Daily  . insulin aspart  0-15 Units Subcutaneous Q4H  . metoprolol succinate  100 mg Oral Daily  . nystatin  5 mL Oral QID  . nystatin   Topical TID  . pantoprazole  40 mg Oral BID  . polyethylene glycol  17 g Oral BID  . sertraline  100 mg Oral Daily  . sodium bicarbonate  650 mg Oral TID  . sucralfate  1 g Oral TID WC & HS   Continuous Infusions: .  ceFAZolin (ANCEF) IV 2 g (04/16/20 1700)  . [START ON 04/17/2020] fluconazole (DIFLUCAN) IV      Principal Problem:   MSSA bacteremia Active Problems:   Hypercalcemia   Depression, major, single episode, moderate (HCC)   DKA (diabetic ketoacidosis) (HCC)   Acute pain of right shoulder   Left elbow pain   Acute kidney injury ([place])   Weakness   Recurrent falls   Sepsis due to methicillin susceptible  Staphylococcus aureus ([place])   Osteomyelitis of left elbow (HCC)   Osteomyelitis of left ulna (HCC)   Sepsis (HCC)   Blood in the stool   Anemia due to GI blood loss   Coagulopathy (HCC)   Duodenal ulcer   Acute gastric ulcer without hemorrhage or perforation   Adenomatous polyp of ascending colon   Adenomatous polyp of descending colon   Stercoral ulcer of rectum   Fecal impaction (HCC)   Lung mass   Thyroid nodule   Pancreatic mass   Aortic atherosclerosis ([place])   LOS: 26 days   How to contact the [facility] Attending or Consulting provider 7A - 7P or covering provider during after hours 7P -7A, for this patient?  1. Check the care team in [facility] and look for a) attending/consulting TRH provider listed and b) the [facility] team listed 2. Log into [email or web address] and use [place]'s universal password to access. If you do not have the password, please contact the hospital operator. 3. Locate the [facility] provider you are looking for under [facility] and page to a number that you can be directly reached. 4. If you still have difficulty reaching the provider, please page the [facility] (Director on Call) for the Hospitalists listed on amion for assistance.

## 2020-04-16 NOTE — Progress Notes (Signed)
Nutrition Follow-up  RD working remotely.  DOCUMENTATION CODES:   Not applicable  INTERVENTION:  - will d/c Boost Breeze. - continue 30 ml Prosource Plus BID, each supplement provides 100 kcal and 15 grams protein.  - will order Glucerna Shake TID, each supplement provides 220 kcal and 10 grams of protein.  NUTRITION DIAGNOSIS:   Increased nutrient needs related to acute illness (sepsis) as evidenced by estimated needs. -ongoing  GOAL:   Patient will meet greater than or equal to 90% of their needs -unmet  MONITOR:   PO intake,Supplement acceptance,Labs,Weight trends  ASSESSMENT:   78 YOF presented to ED with weakness and after a fall. Pt admitted for DKA, hypercalcemia, and sepsis in the setting of MSSA bacteremia, left olecranon bursitis and osteomyelitis. Pt has also developed acute respiratory failure d/t hospital-acquired pneumonia as well as thrush and a right lung mass was discovered. PMH of DM-2, chronic hypercalcemia d/t hypothyroidism, HTN, HLD and recurrent falls.  Pt is currently experiencing homelessness and does not have insurance, therefore cannot be discharged home due to IV antibiotics regimen for 6 weeks.  Diet advanced from CLD to Heart Healthy on 1/28 at 1518, returned to CLD on 1/31 at 1225, and advanced to soft on 2/1 at 1542. She has been eating 0-50% at meals since 1/27.   She has been accepting Prosource ~75% of the time offered and Boost Breeze ~50% of the time offered.  Patient was last weighed on 1/28 and other than that, the only other weight in the chart was on admission date of 1/10. Weight was stable between these dates. Moderate pitting edema to BLE documented in the edema section of flow sheet.   Per notes: - on abx until 2/22 d/t MSSA bacteremia 2/2 chronic olecranon bursitis and phlegmon - L pleural effusion  - concern for pancreatic tail mass  - L-sided thyroid nodule with plan for outpatient ultrasound    Labs reviewed; CBGs: 118, 112,  105 mg/dl, K: 2.9 mmol/l, BUN: 48 mg/dl, creatinine: 2.31 mg/dl, GFR: 23 ml/min. Medications reviewed; sliding scale novolog, 9 units lantus/day, 5 ml mycostatin QID, 40 mg oral protonix BID, 17 g miralax/day, 40 mEq Klor-Con x1 dose 2/3, 650 mg sodium bicarb TID, 1 g carafate TID.   Diet Order:   Diet Order            DIET SOFT Room service appropriate? Yes; Fluid consistency: Thin  Diet effective now                 EDUCATION NEEDS:   No education needs have been identified at this time  Skin:  Skin Assessment: Skin Integrity Issues: Skin Integrity Issues:: Incisions Incisions: open incision/wound on buttocks (bilateral, mid), close incision on left arm  Last BM:  2/2  Height:   Ht Readings from Last 1 Encounters:  03/31/2020 5\' 11"  (1.803 m)    Weight:   Wt Readings from Last 1 Encounters:  03/17/2020 93.9 kg    Estimated Nutritional Needs:  Kcal:  2400-2600 Protein:  115-130 Fluid:  >2.4 L     Jarome Matin, MS, RD, LDN, CNSC Inpatient Clinical Dietitian RD pager # available in AMION  After hours/weekend pager # available in [facility]

## 2020-04-16 NOTE — TOC Progression Note (Signed)
Transition of Care [facility]) - Progression Note    Patient Details  Name: Deborah Figueroa MRN: [id number] Date of Birth: Mar 26, 1955  Transition of Care [facility]) CM/SW Contact  Ross Ludwig, [place] Phone Number: 04/16/2020, 3:12 PM  Clinical Narrative:    CSW sent updated therapy notes to different SNFs to see if a facility can accept patient, she still does not have any bed offers.   Expected Discharge Plan: Skilled Nursing Facility Barriers to Discharge: Continued Medical Work up  Expected Discharge Plan and Services Expected Discharge Plan: [facility] [place]   Discharge Planning Services: CM Consult                                           Social Determinants of Health (SDOH) Interventions    Readmission Risk Interventions No flowsheet data found.

## 2020-04-16 NOTE — Progress Notes (Signed)
Occupational Therapy Treatment Patient Details Name: Deborah Figueroa MRN: [id number] DOB: 1956/03/11 Today's Date: 04/16/2020    History of present illness 65 year old woman presenting with diabetic ketoacidosis and acute renal failure found to have MSSA bacteremia of unclear etiology. PMH of HTN, HLD, and IDDM who presents the ED via EMS for weakness and hyperglycemia.  Pt s/p I and D left elbow infection and osteomyelitis 03/21/2020. Elbow stitches removed 1/20 and patient now with fistula.   OT comments  Treatment focused on UE strengthening needed for ADLs and functional mobility. Patient performed supine in bed with minimal resistance/orange theraband. Patient reports pain/weakness in right hand/wrist - reporting she fell before she came in and her "wrist is sprained." Patient transferred to side of bed with mod assistance and able to tolerate 10 min at edge of bed working on sitting tolerance and activation of core strength and stability needed for standing and dynamic sitting balance. Patient needing to prop elbows on knees due to fatigue. Patient reported feeling light headed with sitting but able to tolerate position approx 10 minute. Patient returned to supine when she reported increased dizziness. Mod assist for LEs and assistance for pulling patient up into bed. Improved participation today. Patient had not been making progress towards and goals downgraded and POC updated.    Follow Up Recommendations  SNF;Supervision/Assistance - 24 hour    Equipment Recommendations  Other (comment) (TBD)    Recommendations for Other Services      Precautions / Restrictions Precautions Precautions: Fall Precaution Comments: left elbow active use to tolerance per ortho notes, may not obtain tricep activation (L) per ortho note. Restrictions Weight Bearing Restrictions: No       Mobility Bed Mobility Overal bed mobility: Needs Assistance Bed Mobility: Supine to Sit;Sit to Supine     Supine  to sit: Mod assist;HOB elevated Sit to supine: Mod assist   General bed mobility comments: Mod assist for guiding lower extremities and trunk lift off - use of pad to scoot to edge of bed. Patient able to assist. Patient mod assist for LEs to return to supine.  Transfers                      Balance Overall balance assessment: Needs assistance Sitting-balance support: No upper extremity supported Sitting balance-Leahy Scale: Fair Sitting balance - Comments: sat at edge of bed min guard for safety - at times patient propping elbows on knees                                   ADL either performed or assessed with clinical judgement   ADL                                               Vision Patient Visual Report: No change from baseline     Perception     Praxis      Cognition Arousal/Alertness: Awake/alert Behavior During Therapy: WFL for tasks assessed/performed Overall Cognitive Status: Within Functional Limits for tasks assessed                                          Exercises Other Exercises Other Exercises: Elbow  flexion x 10 each arm with orange TB (minimal resistance, Elbow extension x 10 each arm with orange TB (minimal resistance), horizontal abduction x 10 w/ BUE Other Exercises: Encouraged ankle pumps, straight leg raises, knee hikes in supine   Shoulder Instructions       General Comments      Pertinent Vitals/ Pain       Pain Assessment: Faces Faces Pain Scale: Hurts little more Pain Location: R hand/wrist Pain Descriptors / Indicators: Grimacing Pain Intervention(s): Limited activity within patient's tolerance;Monitored during session  Home Living                                          Prior Functioning/Environment              Frequency  Min 2X/week        Progress Toward Goals  OT Goals(current goals can now be found in the care plan section)  Progress  towards OT goals: Goals drowngraded-see care plan  Acute Rehab OT Goals Patient Stated Goal: to walk again OT Goal Formulation: With patient Time For Goal Achievement: 04/30/20 Potential to Achieve Goals: Fair ADL Goals Pt Will Perform Grooming: with set-up;sitting Pt Will Perform Lower Body Dressing: with max assist;sit to/from stand Pt Will Transfer to Toilet: stand pivot transfer;bedside commode;with +2 assist Additional ADL Goal #2: Patient will perform UB bathing task seated in recliner with setup.  Plan Discharge plan remains appropriate    Co-evaluation                 AM-PAC OT "6 Clicks" Daily Activity     Outcome Measure   Help from another person eating meals?: A Little Help from another person taking care of personal grooming?: A Little Help from another person toileting, which includes using toliet, bedpan, or urinal?: Total Help from another person bathing (including washing, rinsing, drying)?: A Lot Help from another person to put on and taking off regular upper body clothing?: A Lot Help from another person to put on and taking off regular lower body clothing?: Total 6 Click Score: 12    End of Session    OT Visit Diagnosis: Other abnormalities of gait and mobility (R26.89);Muscle weakness (generalized) (M62.81);Other symptoms and signs involving cognitive function;Pain Pain - Right/Left: Right Pain - part of body: Arm;Shoulder   Activity Tolerance Patient tolerated treatment well   Patient Left in bed;with call bell/phone within reach;with nursing/sitter in room   Nurse Communication Mobility status        Time: 1962-2297 OT Time Calculation (min): 31 min  Charges: OT General Charges $OT Visit: 1 Visit OT Treatments $Therapeutic Activity: 8-22 mins $Therapeutic Exercise: 8-22 mins  Derl Barrow, OTR/L [place]  Office [phone] Pager: [place] 04/16/2020, 4:47 PM

## 2020-04-16 NOTE — Progress Notes (Signed)
The proposed treatment discussed in cancer conference is for discussion purpose only and is not a binding recommendation.  The patient was not physically examined nor present for their treatment options.  Therefore, final treatment plans cannot be decided.  Dr. Cyndia Skeeters aware of recommendations.

## 2020-04-17 ENCOUNTER — Inpatient Hospital Stay (HOSPITAL_COMMUNITY): Payer: Medicaid Other

## 2020-04-17 LAB — BASIC METABOLIC PANEL
Anion gap: 15 (ref 5–15)
BUN: 49 mg/dL — ABNORMAL HIGH (ref 8–23)
CO2: 16 mmol/L — ABNORMAL LOW (ref 22–32)
Calcium: 10.1 mg/dL (ref 8.9–10.3)
Chloride: 108 mmol/L (ref 98–111)
Creatinine, Ser: 2.44 mg/dL — ABNORMAL HIGH (ref 0.44–1.00)
GFR, Estimated: 22 mL/min — ABNORMAL LOW (ref >60)
Glucose, Bld: 119 mg/dL — ABNORMAL HIGH (ref 70–99)
Potassium: 2.8 mmol/L — ABNORMAL LOW (ref 3.5–5.1)
Sodium: 139 mmol/L (ref 135–145)

## 2020-04-17 LAB — GLUCOSE, CAPILLARY
Glucose-Capillary: 117 mg/dL — ABNORMAL HIGH (ref 70–99)
Glucose-Capillary: 121 mg/dL — ABNORMAL HIGH (ref 70–99)
Glucose-Capillary: 136 mg/dL — ABNORMAL HIGH (ref 70–99)
Glucose-Capillary: 266 mg/dL — ABNORMAL HIGH (ref 70–99)
Glucose-Capillary: 62 mg/dL — ABNORMAL LOW (ref 70–99)
Glucose-Capillary: 70 mg/dL (ref 70–99)
Glucose-Capillary: 80 mg/dL (ref 70–99)
Glucose-Capillary: 87 mg/dL (ref 70–99)

## 2020-04-17 LAB — MAGNESIUM: Magnesium: 2 mg/dL (ref 1.7–2.4)

## 2020-04-17 MED ORDER — POTASSIUM CHLORIDE CRYS ER 20 MEQ PO TBCR
40.0000 meq | EXTENDED_RELEASE_TABLET | ORAL | Status: AC
  Administered 2020-04-17 (×2): 40 meq via ORAL
  Filled 2020-04-17 (×2): qty 2

## 2020-04-17 MED ORDER — ACETAMINOPHEN 325 MG PO TABS
650.0000 mg | ORAL_TABLET | Freq: Four times a day (QID) | ORAL | Status: DC | PRN
  Administered 2020-04-17 – 2020-04-23 (×6): 650 mg via ORAL
  Filled 2020-04-17 (×6): qty 2

## 2020-04-17 MED ORDER — TRAZODONE HCL 50 MG PO TABS: 50.0000 mg | ORAL_TABLET | Freq: Every evening | ORAL | Status: DC | PRN

## 2020-04-17 MED ORDER — GADOBUTROL 1 MMOL/ML IV SOLN: 9.0000 mL | Freq: Once | INTRAVENOUS | Status: DC | PRN

## 2020-04-17 NOTE — Progress Notes (Signed)
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  Developed acute respiratory failure secondary pneumonia as well as blood loss anemia from epistaxis.  Underwent EGD and colonoscopy showing stercoral ulcer and gastric ulcer.  Imaging revealed lung mass concerning for malignancy, abnormal pancreatic mass, thyroid nodule.  SNF recommended but patient has no insurance and is homeless.  Difficult to place patient.  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 --Continue antibiotics through 2/22. --OT for triceps activation.  Follow-up with [facility] as an outpatient.  Reported nausea vomiting.  Intermittent.  Abdominal exam benign.  Will check plain film.  Check CMP and lipase in the morning.  Significance unclear.  Diabetes mellitus type 2 with hemoglobin A1c 10.4. --One episode of hypoglycemia.  Secondary to missed lunch.  Blood sugars labile.  AKI superimposed on CKD stage IIIb -baseline unclear, but probably around 1.7. Creatinine remains in 2s without significant change.  Discussed in detail with nephrology, reviewed chart, no recommendations, no identifiable reversible condition.  Okay to proceed with MRI with gadolinium.  Avoid IV contrast.  Hypokalemia, potassium 2.8 --Will replete.  Magnesium within normal limits.  Possible right apical lung mass, possible pathologic fracture T1 transverse process, abnormal appearance T7 and T8 vertebral bodies concerning for metastatic  disease. --Discussed in detail with radiology, okay to proceed with contrast enhanced MRI cervical and thoracic spine.  Discussed also with nephrology as above. --Recommendations from tumor board are to order MR of thoracic spine and CT bx of mass.  Dr. Julien Nordmann is aware of pt and will see her at an outpatient appt.  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  Consider MRI pancreatic protocol in the future, but first priority is further characterizing the lung mass and related effects.  Generalized weakness with recurrent falls in the setting of hypercalcemia, bacteremia and DKA.  MRI brain negative. --unsafe d/c, cannot care for self, no support, homeless, no insurance. No bed offers from SNF.  Difficult to place.  Left-sided thyroid nodule minimally suppressed TSH with low free T3 and T4 within normal limits. --Recommend outpatient ultrasound  Acute on chronic normocytic anemia, Hemoccult positive but no overt GI bleed.  Earlier in hospitalization had epistaxis and coagulopathy, gum bleeding. Has required multiple transfusions. EGD/colonoscopy unrevealing. Hematology without specific recs at this point. Bone marrow suppression suspected as well. --periodic CBC planned.  Lower extremity edema is probably secondary to hypoalbuminemia.  Echocardiogram was unremarkable. --compression hose, supportive care  Aortic atherosclerosis --No treatment indicated.  Primary hyperparathyroidism.  Followed by endocrinology as an outpatient, noted to be a candidate for surgery prior to admission.   --Follow-up with ENT after completion of treatment for bacteremia.  Stercoral ulcer, fecal impaction noted 1/28.  At that time aggressive bowel regimen recommended to keep stool soft and prevent recurrence of fecal impaction.  Consider Carafate enemas for 6 weeks to treat rectal ulcer. --continue bowel regimen, will discuss  enemas with patient tomorrow.  Rectovaginal fistula?.  Stool noted from  the vagina?.  Consider GYN evaluation as outpatient.  Not clear whether this is actual.  RESOLVED . Sepsis secondary to MSSA bacteremia  . DKA . Severe hypercalcemia superimposed on chronic I per calcium he is secondary to primary hyperparathyroidism. Followed by endocrinology as an outpatient. Treated with bisphosphonate and calcitonin. . Acute hypoxemic respiratory failure with uncompensated respiratory acidosis secondary to bilateral lower lobe pneumonia . Acute metabolic encephalopathy.  MRI brain no acute finding. . Oropharyngeal thrush  Nutritional Assessment: Body mass index is 28.87 kg/m.Marland Kitchen Seen by dietician.  I agree with the assessment and plan as outlined below: Nutrition Status: Nutrition Problem: Increased nutrient needs Etiology: acute illness (sepsis) Signs/Symptoms: estimated needs Interventions: Prostat,MVI,Glucerna shake  Disposition Plan:  Discussion: Complex case.  Case was discussed with nephrology and radiologist today, time greater than 35 minutes, greater than 50% counseling and coordination of care.  Status is: Inpatient  Remains inpatient appropriate because:Unsafe d/c plan   Dispo: The patient is from: Home              Anticipated d/c is to: uncertain              Anticipated d/c date is: > 3 days              Patient currently is not medically stable to d/c.   Difficult to place patient Yes  DVT prophylaxis: SCDs Start: 03/15/2020 1608   Code Status: Full Code Level of care: Med-Surg Family Communication: none  Murray Hodgkins, MD  [facility] Direct contact: see [email or web address] (further directions at bottom of note if needed) 7PM-7AM contact night coverage as at bottom of note 04/17/2020, 6:59 PM  LOS: 27 days   Significant Hospital Events   . 1/8 admit for DKA, falls, AKI, hypercalcemia . 1/9 MSSA bacteremia, ID consult, ortho consult for elbow infection . 1/10  I&D . 1/13 ID signs off . 1/25 GI consult for anemia . 1/25 oncology consult   Consults:  . ID . Ortho . GI   Procedures:  . 1/10 I&D left septic olecranon bursitis including osteo of the olecranon . 1/28 colonoscopy.  Fecal impaction with associated stercoral ulcer.  Manual disimpaction.  Polyp. . 1/28 EGD.  Linear esophageal ulcer, nonbleeding gastric ulcer, erosive gastropathy, nonbleeding duodenal ulcers  Significant Diagnostic Tests:  . 1/11 TEE no endocarditis  Interval History/Subjective  CC: f/u infection  Reports some nausea today.  Not clear whether she had vomiting or not.  Had some grits for breakfast.  Breathing okay. Objective   Vitals:  Vitals:   04/17/20 0447 04/17/20 1514  BP: (!) 150/85 130/81  Pulse: 84 78  Resp: 16 20  Temp: 98 F (36.7 C) 98.8 F (37.1 C)  SpO2: 99% 97%    Exam:  Constitutional:   . Appears calm and comfortable ENMT:  . grossly normal hearing  Respiratory:  . CTA bilaterally, no w/r/r.  . Respiratory effort normal.  Cardiovascular:  . RRR, no m/r/g . 2+ BLE extremity edema   Abdomen:  . Soft, ntnd, +BS Psychiatric:  . Mental status o Mood appropriate, affect odd  I have personally reviewed the following:   Today's Data  . Glucose 62 this afternoon after skipping lunch.  Scheduled Meds: . (feeding supplement) PROSource Plus  30 mL Oral TID BM  . feeding supplement (GLUCERNA SHAKE)  237 mL Oral TID BM  . folic acid  1 mg Oral Daily  . insulin aspart  0-15 Units Subcutaneous Q4H  . metoprolol succinate  100 mg Oral Daily  . nystatin  5 mL Oral QID  . nystatin   Topical TID  . pantoprazole  40 mg Oral BID  . polyethylene glycol  17 g Oral BID  . sertraline  100 mg Oral Daily  . sodium bicarbonate  650 mg Oral TID  . sucralfate  1 g Oral TID WC & HS   Continuous Infusions: .  ceFAZolin (ANCEF) IV 2 g (04/17/20 1726)  . fluconazole (DIFLUCAN) IV 100 mg (04/17/20 1130)    Principal Problem:   MSSA  bacteremia Active Problems:   Hypercalcemia   Depression, major, single episode, moderate (HCC)   DKA (diabetic ketoacidosis) (HCC)   Acute pain of right shoulder   Left elbow pain   Acute kidney injury ([place])   Weakness   Recurrent falls   Sepsis due to methicillin susceptible Staphylococcus aureus ([place])   Osteomyelitis of left elbow (HCC)   Osteomyelitis of left ulna (HCC)   Sepsis (HCC)   Blood in the stool   Anemia due to GI blood loss   Coagulopathy (HCC)   Duodenal ulcer   Acute gastric ulcer without hemorrhage or perforation   Adenomatous polyp of ascending colon   Adenomatous polyp of descending colon   Stercoral ulcer of rectum   Fecal impaction (HCC)   Lung mass   Thyroid nodule   Pancreatic mass   Aortic atherosclerosis ([place])   LOS: 27 days   How to contact the [facility] Attending or Consulting provider 7A - 7P or covering provider during after hours 7P -7A, for this patient?  1. Check the care team in [facility] and look for a) attending/consulting [facility] provider listed and b) the [facility] team listed 2. Log into [email or web address] and use [facility]'s universal password to access. If you do not have the password, please contact the hospital operator. 3. Locate the [facility] provider you are looking for under [facility] and page to a number that you can be directly reached. 4. If you still have difficulty reaching the provider, please page the [facility] (Director on Call) for the Hospitalists listed on amion for assistance.

## 2020-04-17 NOTE — Progress Notes (Signed)
Physical Therapy Treatment Patient Details Name: Deborah Figueroa MRN: [id number] DOB: 02-Mar-1956 Today's Date: 04/17/2020    History of Present Illness Pt 65 year old woman presenting with diabetic ketoacidosis and acute renal failure found to have MSSA bacteremia of unclear etiology. PMH of HTN, HLD, and IDDM who presents the ED via EMS for weakness and hyperglycemia.  Pt s/p I and D left elbow infection and osteomyelitis 04/04/2020. Elbow stitches removed 1/20. Pt with complex hospital stay including PNE, possible pancreatic mass, possible R apical lung mass, possible pathologic fracture T1 transverse process, abnormal appearance T7 and T8 vertebral bodies concerning for metastatic disease, and possible rectovaginal fistula.    PT Comments    Pt making good progress today.  She was able to stand into STEDY with assist of 2, which she has not been able to do in weeks.  She is anxious and required increased time for cues and encouragement.  Noted pt difficult d/c plan due to no insurance and homeless.  Pt needs SNF level of care.     Follow Up Recommendations  SNF     Equipment Recommendations  None recommended by PT    Recommendations for Other Services       Precautions / Restrictions Precautions Precautions: Fall Precaution Comments: left elbow active use to tolerance per ortho notes, may not obtain tricep activation (L) per ortho note.    Mobility  Bed Mobility Overal bed mobility: Needs Assistance Bed Mobility: Supine to Sit Rolling: Mod assist;+2 for physical assistance;+2 for safety/equipment Sidelying to sit: Mod assist;+2 for physical assistance;+2 for safety/equipment;HOB elevated       General bed mobility comments: Cues for sequence: Pt assisted with legs to EOB with cues, mod A to roll, and mod A x 2 to slowly lift trunk due to hx of dizziness - no dizziness today  Transfers Overall transfer level: Needs assistance Equipment used: Rolling walker (2  wheeled) Transfers: Sit to/from Stand Sit to Stand: From elevated surface;+2 physical assistance;Mod assist         General transfer comment: Required 2 attempts into STEDY from bed.  First attempt with therapist directly beside pt.  On second attempt, PT and tech postioned side and front of pt to lean back to lift pt, used gait belt, and bed pad - once initiated pt able to stand up into STEDY with assist to keep L hand on bar.  STEDY used for transfer.  Pt able to stand from STEDY with min A x 2  Ambulation/Gait                 Stairs             Wheelchair Mobility    Modified Rankin (Stroke Patients Only)       Balance Overall balance assessment: Needs assistance Sitting-balance support: Bilateral upper extremity supported Sitting balance-Leahy Scale: Fair Sitting balance - Comments: sat at edge of bed min guard for safety - at times patient propping elbows on knees   Standing balance support: Bilateral upper extremity supported Standing balance-Leahy Scale: Poor Standing balance comment: Used bar of STEDY but pt able to stand for 1 min with min A of 2 for safety and to encourage upright posture (support and cues to lift shoulders)                            Cognition Arousal/Alertness: Awake/alert Behavior During Therapy: Anxious Overall Cognitive Status: Within Functional Limits for tasks assessed  General Comments: Pt oriented x 4 and able to follow commands; She was anxious at times with high fear of falling requiring increased time and encouragement      Exercises General Exercises - Lower Extremity Ankle Circles/Pumps: Supine;AROM;Strengthening;Both;20 reps Long Arc Quad: AROM;Both;10 reps;Seated Heel Slides: AROM;Both;5 reps;Supine    General Comments General comments (skin integrity, edema, etc.): VSS      Pertinent Vitals/Pain Pain Assessment: Faces Faces Pain Scale: Hurts little  more Pain Location: back Pain Descriptors / Indicators: Discomfort Pain Intervention(s): Limited activity within patient's tolerance;Monitored during session;Repositioned    Home Living                      Prior Function            PT Goals (current goals can now be found in the care plan section) Acute Rehab PT Goals Patient Stated Goal: to walk again PT Goal Formulation: With patient Time For Goal Achievement: 04/29/20 Potential to Achieve Goals: Fair Progress towards PT goals: Progressing toward goals    Frequency    Min 2X/week      PT Plan Current plan remains appropriate    Co-evaluation              AM-PAC PT "6 Clicks" Mobility   Outcome Measure  Help needed turning from your back to your side while in a flat bed without using bedrails?: A Lot Help needed moving from lying on your back to sitting on the side of a flat bed without using bedrails?: A Lot Help needed moving to and from a bed to a chair (including a wheelchair)?: Total Help needed standing up from a chair using your arms (e.g., wheelchair or bedside chair)?: Total Help needed to walk in hospital room?: Total Help needed climbing 3-5 steps with a railing? : Total 6 Click Score: 8    End of Session Equipment Utilized During Treatment: Gait belt;Other (comment) (STEDY) Activity Tolerance: Patient tolerated treatment well Patient left: with call bell/phone within reach;with chair alarm set;in chair (hoyer pad under pt with bed pad and pillow between skin and hoyer; pillows at L side and under R arm and leg) Nurse Communication: Mobility status;Need for lift equipment (white board communication) PT Visit Diagnosis: Muscle weakness (generalized) (M62.81);Other abnormalities of gait and mobility (R26.89)     Time: 0100-7121 PT Time Calculation (min) (ACUTE ONLY): 33 min  Charges:  $Therapeutic Activity: 23-37 mins                     Abran Richard, PT Acute Rehab Services Pager  [phone] [facility] [phone]     Karlton Lemon 04/17/2020, 2:06 PM

## 2020-04-17 NOTE — Progress Notes (Signed)
Pt requested to terminate the MRI exam early. Very limited MRI of the thoracic spine no contrast given.

## 2020-04-18 ENCOUNTER — Inpatient Hospital Stay (HOSPITAL_COMMUNITY): Payer: Medicaid Other

## 2020-04-18 DIAGNOSIS — M4622 Osteomyelitis of vertebra, cervical region: Secondary | ICD-10-CM

## 2020-04-18 DIAGNOSIS — K5900 Constipation, unspecified: Secondary | ICD-10-CM

## 2020-04-18 DIAGNOSIS — K253 Acute gastric ulcer without hemorrhage or perforation: Secondary | ICD-10-CM

## 2020-04-18 DIAGNOSIS — M4624 Osteomyelitis of vertebra, thoracic region: Secondary | ICD-10-CM

## 2020-04-18 LAB — COMPREHENSIVE METABOLIC PANEL
ALT: <5 U/L (ref 0–44)
AST: 10 U/L — ABNORMAL LOW (ref 15–41)
Albumin: 1.5 g/dL — ABNORMAL LOW (ref 3.5–5.0)
Alkaline Phosphatase: 73 U/L (ref 38–126)
Anion gap: 14 (ref 5–15)
BUN: 52 mg/dL — ABNORMAL HIGH (ref 8–23)
CO2: 15 mmol/L — ABNORMAL LOW (ref 22–32)
Calcium: 10.6 mg/dL — ABNORMAL HIGH (ref 8.9–10.3)
Chloride: 111 mmol/L (ref 98–111)
Creatinine, Ser: 2.06 mg/dL — ABNORMAL HIGH (ref 0.44–1.00)
GFR, Estimated: 26 mL/min — ABNORMAL LOW (ref >60)
Glucose, Bld: 142 mg/dL — ABNORMAL HIGH (ref 70–99)
Potassium: 3.6 mmol/L (ref 3.5–5.1)
Sodium: 140 mmol/L (ref 135–145)
Total Bilirubin: 0.6 mg/dL (ref 0.3–1.2)
Total Protein: 6.2 g/dL — ABNORMAL LOW (ref 6.5–8.1)

## 2020-04-18 LAB — GLUCOSE, CAPILLARY
Glucose-Capillary: 112 mg/dL — ABNORMAL HIGH (ref 70–99)
Glucose-Capillary: 113 mg/dL — ABNORMAL HIGH (ref 70–99)
Glucose-Capillary: 126 mg/dL — ABNORMAL HIGH (ref 70–99)
Glucose-Capillary: 130 mg/dL — ABNORMAL HIGH (ref 70–99)
Glucose-Capillary: 130 mg/dL — ABNORMAL HIGH (ref 70–99)

## 2020-04-18 LAB — LIPASE, BLOOD: Lipase: 40 U/L (ref 11–51)

## 2020-04-18 LAB — MAGNESIUM: Magnesium: 2 mg/dL (ref 1.7–2.4)

## 2020-04-18 MED ORDER — PROCHLORPERAZINE EDISYLATE 10 MG/2ML IJ SOLN
10.0000 mg | Freq: Four times a day (QID) | INTRAMUSCULAR | Status: DC | PRN
  Administered 2020-04-18 – 2020-04-20 (×2): 10 mg via INTRAVENOUS
  Filled 2020-04-18 (×2): qty 2

## 2020-04-18 MED ORDER — MILK AND MOLASSES ENEMA
1.0000 | Freq: Once | RECTAL | Status: AC
  Administered 2020-04-18: 240 mL via RECTAL
  Filled 2020-04-18: qty 240

## 2020-04-18 NOTE — Plan of Care (Signed)

## 2020-04-18 NOTE — Consult Note (Addendum)
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 Consider L/S spine MRI (she states she has hx of L5 fusion) 2. Continue her ancef 3. Consider prolonging her total length of therapy to 8 weeks  4. Consider po anbx after that 5. Repeat imaging at 8 weeks 6. Continue eval of pancreatic tail lesion  7. Appreciate neurosurgical eval  Will follow with you  Thank you so much for this interesting consult,  Principal Problem:   MSSA bacteremia Active Problems:   Hypercalcemia   Depression, major, single episode, moderate (HCC)   DKA (diabetic ketoacidosis) (HCC)   Acute pain of right shoulder   Left elbow pain   Acute kidney injury ([place])   Weakness   Recurrent falls   Sepsis due to methicillin susceptible Staphylococcus aureus ([place])   Osteomyelitis of left elbow (HCC)   Osteomyelitis of left ulna (HCC)   Sepsis ([place])   Blood in the stool   Anemia due to GI blood loss   Coagulopathy (HCC)   Duodenal ulcer   Acute gastric ulcer without hemorrhage or perforation   Adenomatous polyp of ascending colon   Adenomatous polyp of descending colon   Stercoral ulcer of rectum   Fecal impaction (HCC)   Lung mass   Thyroid nodule   Pancreatic mass   Aortic atherosclerosis ([place])   . (feeding supplement) PROSource Plus  30 mL Oral TID BM  . feeding supplement (GLUCERNA SHAKE)  237 mL Oral TID BM  . folic acid  1 mg Oral Daily  . insulin aspart  0-15 Units Subcutaneous Q4H  . metoprolol succinate  100 mg Oral Daily  . nystatin  5 mL Oral QID  . nystatin   Topical TID  . pantoprazole  40 mg Oral BID  . polyethylene glycol  17 g Oral BID   . sertraline  100 mg Oral Daily  . sodium bicarbonate  650 mg Oral TID  . sucralfate  1 g Oral TID WC & HS    HPI: LANIECE HORNBAKER is a 65 y.o. female adm 1-8 with MSSA bacteremia and DKA. She was found to have L olecranon bursitis, osteomyelitis. She also was found to have LUE myositis and abscess (I & D 1-10). She had TEE (-). She was treated with ancef. She was planned for 6 weeks of anbx.  Her course has been complicated by gastric and stercoral ulcer (1-28).  As well, she was dx with HCAP and a rectovaginal fistula.   She was found to have a R lung mass as well as a pancreatic mass during her w/u. She was noted to have T1, T7 and T8 vertebral lesions.  As f/u, staging, she had MRI which showed that her:  right lung apex looks more like a fluid collection by MRI. Additionally, there is a right paravertebral fluid collection extending from C6-T2. There is fluid associated with the right C7-T1. Neurosurgery did not think surgical intervention or aspirate was indicated.  Review of Systems: Review of Systems  Constitutional: Negative for chills and fever.  Eyes: Negative for blurred vision.  Respiratory: Negative for cough and shortness of breath.   Gastrointestinal: Positive for constipation. Negative for abdominal pain and diarrhea.  Neurological: Negative for sensory change.  her elbow feels much better.  She is unclear how long she has had loss of light touch sensation in her feet.  Please see HPI. All other systems reviewed and negative.   Past Medical History:  Diagnosis Date  . Cellulitis   . Depression   . Diabetes mellitus without complication ([place])   . Hyperlipidemia   . Hypertension   . Lung mass 04/15/2020  . Pancreatic mass 04/15/2020  . Renal insufficiency   . Seasonal allergies   . Thyroid nodule 04/15/2020    Social History   Tobacco Use  . Smoking status: Never Smoker  . Smokeless tobacco: Never Used  Vaping Use  . Vaping Use: Never used  Substance Use  Topics  . Alcohol use: No  . Drug use: No    Family History  Problem Relation Age of Onset  . Stroke Mother   . Hypertension Mother   . Hyperlipidemia Mother   . Cancer Father 86       liver and colon  . Depression Father   . Cancer Maternal Aunt        breast  . Diabetes Brother   . Cancer Maternal Grandmother        breast     Medications:  Scheduled: . (feeding supplement) PROSource Plus  30 mL Oral TID BM  . feeding supplement (GLUCERNA SHAKE)  237 mL Oral TID BM  . folic acid  1 mg Oral Daily  . insulin aspart  0-15 Units Subcutaneous Q4H  . metoprolol succinate  100 mg Oral Daily  . nystatin  5 mL Oral QID  . nystatin   Topical TID  . pantoprazole  40 mg Oral BID  . polyethylene glycol  17 g Oral BID  . sertraline  100 mg Oral Daily  . sodium bicarbonate  650 mg Oral TID  . sucralfate  1 g Oral TID WC & HS    Abtx:  Anti-infectives (From admission, onward)   Start     Dose/Rate Route Frequency Ordered Stop   04/17/20 1000  fluconazole (DIFLUCAN) IVPB 100 mg        100 mg 50 mL/hr over 60 Minutes Intravenous Every 24 hours 04/16/20 1229 04/21/20 0959   04/07/2020 1400  ceFAZolin (ANCEF) IVPB 2g/100 mL premix        2 g 200 mL/hr over 30 Minutes Intravenous Every 8 hours 03/29/2020 0735     04/09/20 1300  fluconazole (DIFLUCAN) tablet 100 mg  Status:  Discontinued        100 mg Oral Daily 04/09/20 1222 04/16/20 1227   04/06/20 0000  ceFAZolin (ANCEF) IVPB        2 g Intravenous Every 8 hours 04/06/20 1604 05/16/20 2359   04/05/20 1800  ceFAZolin (ANCEF) IVPB 2g/100 mL premix  Status:  Discontinued        2 g 200 mL/hr over 30 Minutes Intravenous Every 12 hours 04/05/20 1345 04/02/2020 0735   04/05/20 1400  ceFAZolin (ANCEF) IVPB 2g/100 mL premix  Status:  Discontinued        2 g 200 mL/hr over 30 Minutes Intravenous Every 8 hours 04/05/20 1301 04/05/20 1345   04/02/20 0522  ceFEPIme (MAXIPIME) 2 g in sodium chloride 0.9 % 100 mL IVPB  Status:  Discontinued         2 g 200  mL/hr over 30 Minutes Intravenous Every 24 hours 04/01/20 0932 04/05/20 1215   03/31/20 1800  ceFEPIme (MAXIPIME) 2 g in sodium chloride 0.9 % 100 mL IVPB  Status:  Discontinued        2 g 200 mL/hr over 30 Minutes Intravenous Every 12 hours 03/31/20 1244 04/01/20 0932   03/30/20 1030  piperacillin-tazobactam (ZOSYN) IVPB 3.375 g  Status:  Discontinued        3.375 g 12.5 mL/hr over 240 Minutes Intravenous Every 8 hours 03/30/20 1006 03/31/20 1236   03/29/20 1300  fluconazole (DIFLUCAN) tablet 150 mg  Status:  Discontinued        150 mg Oral  Once 03/29/20 1201 04/09/20 1222   04/08/2020 1145  ceFAZolin (ANCEF) IVPB 2g/100 mL premix  Status:  Discontinued        2 g 200 mL/hr over 30 Minutes Intravenous Every 8 hours 03/27/2020 1055 03/30/20 0951   03/20/2020 1100  nafcillin 12 g in sodium chloride 0.9 % 500 mL continuous infusion  Status:  Discontinued        12 g 20.8 mL/hr over 24 Hours Intravenous Every 24 hours 04/01/2020 0949 03/26/2020 1055   03/22/20 1500  nafcillin 2 g in sodium chloride 0.9 % 100 mL IVPB  Status:  Discontinued        2 g 200 mL/hr over 30 Minutes Intravenous Every 4 hours 03/22/20 1354 04/01/2020 0949   03/22/20 1345  nafcillin injection 2 g  Status:  Discontinued        2 g Intravenous Every 4 hours 03/22/20 1344 03/22/20 1354   03/22/20 1300  ceFAZolin (ANCEF) IVPB 2g/100 mL premix  Status:  Discontinued        2 g 200 mL/hr over 30 Minutes Intravenous Every 8 hours 03/22/20 1257 03/22/20 1344        OBJECTIVE: Blood pressure (!) 92/52, pulse 74, temperature (!) 97.5 F (36.4 C), temperature source Oral, resp. rate 16, height 5\' 11"  (1.803 m), weight 93.9 kg, SpO2 99 %.  Physical Exam Constitutional:      Appearance: Normal appearance. She is obese.  HENT:     Mouth/Throat:     Mouth: Mucous membranes are moist.     Pharynx: No oropharyngeal exudate.  Eyes:     Extraocular Movements: Extraocular movements intact.     Pupils: Pupils are equal,  round, and reactive to light.  Cardiovascular:     Rate and Rhythm: Normal rate and regular rhythm.  Pulmonary:     Effort: Pulmonary effort is normal.     Breath sounds: Normal breath sounds.  Abdominal:     General: Bowel sounds are normal. There is no distension.     Palpations: Abdomen is soft.     Tenderness: There is no abdominal tenderness.  Musculoskeletal:     Cervical back: Normal range of motion and neck supple.     Right lower leg: No edema.     Left lower leg: No edema.  Skin:    General: Skin is warm and dry.     Comments: She has a erythematous blister (/) over R 2nd toe  Neurological:     Mental Status: She is alert.  Psychiatric:        Mood and Affect: Mood normal.     Comments: She knows date, year, president     Lab Results Results for orders placed or performed during the hospital encounter of 03/15/2020 (from the past 48 hour(s))  Glucose, capillary  Status: None   Collection Time: 04/16/20  4:30 PM  Result Value Ref Range   Glucose-Capillary 84 70 - 99 mg/dL    Comment: Glucose reference range applies only to samples taken after fasting for at least 8 hours.  Glucose, capillary     Status: Abnormal   Collection Time: 04/16/20  7:46 PM  Result Value Ref Range   Glucose-Capillary 107 (H) 70 - 99 mg/dL    Comment: Glucose reference range applies only to samples taken after fasting for at least 8 hours.  Glucose, capillary     Status: Abnormal   Collection Time: 04/17/20 12:21 AM  Result Value Ref Range   Glucose-Capillary 121 (H) 70 - 99 mg/dL    Comment: Glucose reference range applies only to samples taken after fasting for at least 8 hours.  Glucose, capillary     Status: Abnormal   Collection Time: 04/17/20  3:44 AM  Result Value Ref Range   Glucose-Capillary 136 (H) 70 - 99 mg/dL    Comment: Glucose reference range applies only to samples taken after fasting for at least 8 hours.  Basic metabolic panel     Status: Abnormal   Collection Time:  04/17/20  5:11 AM  Result Value Ref Range   Sodium 139 135 - 145 mmol/L   Potassium 2.8 (L) 3.5 - 5.1 mmol/L   Chloride 108 98 - 111 mmol/L   CO2 16 (L) 22 - 32 mmol/L   Glucose, Bld 119 (H) 70 - 99 mg/dL    Comment: Glucose reference range applies only to samples taken after fasting for at least 8 hours.   BUN 49 (H) 8 - 23 mg/dL   Creatinine, Ser 2.44 (H) 0.44 - 1.00 mg/dL   Calcium 10.1 8.9 - 10.3 mg/dL   GFR, Estimated 22 (L) >60 mL/min    Comment: (NOTE) Calculated using the CKD-EPI Creatinine Equation (2021)    Anion gap 15 5 - 15    Comment: Performed at [facility], [place] [address] [place], [place] [address]  Magnesium     Status: None   Collection Time: 04/17/20  5:11 AM  Result Value Ref Range   Magnesium 2.0 1.7 - 2.4 mg/dL    Comment: Performed at [facility], [place] [address], [place], [place] [address]  Glucose, capillary     Status: None   Collection Time: 04/17/20  7:31 AM  Result Value Ref Range   Glucose-Capillary 80 70 - 99 mg/dL    Comment: Glucose reference range applies only to samples taken after fasting for at least 8 hours.  Glucose, capillary     Status: Abnormal   Collection Time: 04/17/20 12:12 PM  Result Value Ref Range   Glucose-Capillary 266 (H) 70 - 99 mg/dL    Comment: Glucose reference range applies only to samples taken after fasting for at least 8 hours.  Glucose, capillary     Status: None   Collection Time: 04/17/20  4:00 PM  Result Value Ref Range   Glucose-Capillary 87 70 - 99 mg/dL    Comment: Glucose reference range applies only to samples taken after fasting for at least 8 hours.  Glucose, capillary     Status: Abnormal   Collection Time: 04/17/20  6:17 PM  Result Value Ref Range   Glucose-Capillary 62 (L) 70 - 99 mg/dL    Comment: Glucose reference range applies only to samples taken after fasting for at least 8 hours.  Glucose, capillary  Status: None   Collection Time: 04/17/20  6:43 PM   Result Value Ref Range   Glucose-Capillary 70 70 - 99 mg/dL    Comment: Glucose reference range applies only to samples taken after fasting for at least 8 hours.  Glucose, capillary     Status: Abnormal   Collection Time: 04/17/20  8:36 PM  Result Value Ref Range   Glucose-Capillary 117 (H) 70 - 99 mg/dL    Comment: Glucose reference range applies only to samples taken after fasting for at least 8 hours.  Glucose, capillary     Status: Abnormal   Collection Time: 04/18/20 12:14 AM  Result Value Ref Range   Glucose-Capillary 113 (H) 70 - 99 mg/dL    Comment: Glucose reference range applies only to samples taken after fasting for at least 8 hours.  Glucose, capillary     Status: Abnormal   Collection Time: 04/18/20  4:46 AM  Result Value Ref Range   Glucose-Capillary 126 (H) 70 - 99 mg/dL    Comment: Glucose reference range applies only to samples taken after fasting for at least 8 hours.  Magnesium     Status: None   Collection Time: 04/18/20  6:05 AM  Result Value Ref Range   Magnesium 2.0 1.7 - 2.4 mg/dL    Comment: Performed at [facility], [place] [address] [place], [place] [address]  Lipase, blood     Status: None   Collection Time: 04/18/20  6:05 AM  Result Value Ref Range   Lipase 40 11 - 51 U/L    Comment: Performed at [facility], [place] [address] [place], [place] [address]  Comprehensive metabolic panel     Status: Abnormal   Collection Time: 04/18/20  6:05 AM  Result Value Ref Range   Sodium 140 135 - 145 mmol/L   Potassium 3.6 3.5 - 5.1 mmol/L    Comment: DELTA CHECK NOTED NO VISIBLE HEMOLYSIS    Chloride 111 98 - 111 mmol/L   CO2 15 (L) 22 - 32 mmol/L   Glucose, Bld 142 (H) 70 - 99 mg/dL    Comment: Glucose reference range applies only to samples taken after fasting for at least 8 hours.   BUN 52 (H) 8 - 23 mg/dL   Creatinine, Ser 2.06 (H) 0.44 - 1.00 mg/dL   Calcium 10.6 (H) 8.9 - 10.3 mg/dL   Total Protein 6.2 (L) 6.5 -  8.1 g/dL   Albumin 1.5 (L) 3.5 - 5.0 g/dL   AST 10 (L) 15 - 41 U/L   ALT <5 0 - 44 U/L   Alkaline Phosphatase 73 38 - 126 U/L   Total Bilirubin 0.6 0.3 - 1.2 mg/dL   GFR, Estimated 26 (L) >60 mL/min    Comment: (NOTE) Calculated using the CKD-EPI Creatinine Equation (2021)    Anion gap 14 5 - 15    Comment: Performed at [facility], [place] [address], [place], [place] [address]  Glucose, capillary     Status: Abnormal   Collection Time: 04/18/20  7:28 AM  Result Value Ref Range   Glucose-Capillary 130 (H) 70 - 99 mg/dL    Comment: Glucose reference range applies only to samples taken after fasting for at least 8 hours.   Comment 1 Notify RN   Glucose, capillary     Status: Abnormal   Collection Time: 04/18/20 11:30 AM  Result Value Ref Range   Glucose-Capillary 112 (H) 70 - 99 mg/dL  Comment: Glucose reference range applies only to samples taken after fasting for at least 8 hours.   Comment 1 Notify RN       Component Value Date/Time   SDES  03/30/2020 1330    URINE, CLEAN CATCH Performed at [facility], [place] [address], [place], [place] [address]    SPECREQUEST  03/30/2020 1330    NONE Performed at [facility], [place] [address], [place], [place] [address]    CULT >=100,000 COLONIES/mL YEAST (A) 03/30/2020 1330   REPTSTATUS 04/01/2020 FINAL 03/30/2020 1330   DG Wrist 2 Views Right  Result Date: 04/16/2020 CLINICAL DATA:  Right wrist pain after fall last month. EXAM: RIGHT WRIST - 2 VIEW COMPARISON:  None. FINDINGS: There is no evidence of fracture or dislocation. Severe narrowing of radiocarpal joint is noted as well as several intercarpal joints. Moderate degenerative changes seen involving the first carpometacarpal joint. Soft tissues are unremarkable. IMPRESSION: Multifocal degenerative joint disease. No acute abnormality seen in the right wrist. Electronically Signed   By: Marijo Conception M.D.   On: 04/16/2020 17:03    MR CERVICAL SPINE WO CONTRAST  Result Date: 04/17/2020 CLINICAL DATA:  Apical lung mass with bone involvement. Assess for spinal metastatic disease. History of renal disease. EXAM: MRI CERVICAL SPINE WITHOUT CONTRAST TECHNIQUE: Multiplanar, multisequence MR imaging of the cervical spine was performed. No intravenous contrast was administered. COMPARISON:  Chest CT 04/14/2020 FINDINGS: Alignment: 1 or 2 mm of degenerative anterolisthesis at C7-T1. Otherwise normal. Vertebrae: I do not see evidence clear metastatic disease to bone. There is some endplate edema at the M0-9 level to a mild degree and a more pronounced degree at the C7-T1 level. The differential diagnosis is degenerative disease versus is early discitis osteomyelitis versus arthropathy associated with chronic renal disease. See below. Cord: No cord compression or primary cord lesion. Posterior Fossa, vertebral arteries, paraspinal tissues: Limited visualization of the posterior fossa is unremarkable. There is a right paravertebral T2 bright abnormality most consistent with a fluid collection, extending from C6 to T2 along the anterior right margin of the vertebral bodies. There appears to be involvement of the costovertebral articulation on the right at the first rib. Question if this is in communication with fluid associated with right-sided facet arthropathy at C7-T1. See below. Disc levels: Foramen magnum is widely patent.  C1-2 and C2-3 are unremarkable. C3-4: Endplate osteophytes and bulging of the disc. More prominent on the left. Mild narrowing of the left side of the canal and intervertebral foramen on the left. Some potential this could affect the left C4 nerve. As noted above, there is mild endplate edema, which I favor is degenerative at this level. C4-5: Chronic disc degeneration with endplate osteophytes and bulging of the disc. No compressive canal stenosis. Moderate foraminal narrowing on the left that could affect the C5 nerve. C5-6:  Endplate osteophytes and bulging of the disc. No compressive canal or foraminal narrowing. C6-7: Endplate osteophytes and bulging of the disc. No compressive canal or foraminal narrowing. C7-T1: Facet arthropathy on the right with edematous change. Disc pathology with disc space narrowing and fluid intensity material in the disc space. Mild endplate edema. As noted above, there is a right paravertebral fluid collection extending from C6-T2. There is also pathology affecting the right first costovertebral articulation. Whereas all these findings could conceivably relate to degenerative disease or spondylopathy of renal failure, infectious inflammation is a real concern. This does not look to me like neoplastic disease. I would  also say that the pleural density at the apex on the right is also T2 bright and could be a similar fluid collection. IMPRESSION: I do not see findings that suggest that we are dealing with metastatic disease to the spine. What was described as a mass at the right lung apex looks more like a fluid collection by MRI. Additionally, there is a right paravertebral fluid collection extending from C6-T2. There is fluid associated with the right C7-T1 facet joint, the right first costovertebral articulation, and within the C7-T1 disc space. This constellation of findings is most concerning for spinal infection. Spondylopathy of renal failure could possibly relate to these findings, but infection is my primary concern. Electronically Signed   By: Nelson Chimes M.D.   On: 04/17/2020 22:03   MR THORACIC SPINE WO CONTRAST  Result Date: 04/18/2020 CLINICAL DATA:  Cancer of unknown primary. Staging apical lung mass. Bacteremia EXAM: MRI THORACIC SPINE WITHOUT CONTRAST TECHNIQUE: Multiplanar, multisequence MR imaging of the thoracic spine was performed. No intravenous contrast was administered. COMPARISON:  Chest CT from 3 days ago. Cervical MRI from the same day. FINDINGS: The patient terminated the study  early. Only sagittal T2 and axial T2 weighted imaging was acquired. Apparent fluid collection at the right first costovertebral junction was better seen on prior cervical MRI, with attendant regional marrow signal abnormality and presumed fluid collection at the right superior sulcus measuring 17 mm. At T7-8 there is disc space T2 hyperintensity without ankylosis but with subtle endplate erosions by CT. The endplates appear indistinct by T1 weighted imaging on the sagittal counter sequence as well. No visible adjacent paravertebral fat inflammation. T9-10 central disc protrusion without neural impingement. Small left more than right pleural effusion IMPRESSION: 1. Truncated study due to patient request. 2. Presumed fluid collection at the right first costovertebral junction with presumed separate fluid collection at the superior sulcus. As noted on prior cervical MRI, spinal infection is the primary concern. 3. 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 Electronically Signed   By: Monte Fantasia M.D.   On: 04/18/2020 09:29   DG Hand 2 View Right  Result Date: 04/16/2020 CLINICAL DATA:  Right hand pain after fall last month. EXAM: RIGHT HAND - 2 VIEW COMPARISON:  None. FINDINGS: There is no evidence of fracture or dislocation. Moderate degenerative changes seen involving the first carpometacarpal joint. Soft tissues are unremarkable. IMPRESSION: Moderate degenerative joint disease of the first carpometacarpal joint. No acute abnormality seen in the right hand. Electronically Signed   By: Marijo Conception M.D.   On: 04/16/2020 17:04   DG Abd Portable 1V  Result Date: 04/18/2020 CLINICAL DATA:  Nausea. EXAM: PORTABLE ABDOMEN - 1 VIEW COMPARISON:  None. FINDINGS: The bowel gas pattern is normal. No radio-opaque calculi or other significant radiographic abnormality are seen. IMPRESSION: Negative. Electronically Signed   By: Marijo Conception M.D.   On: 04/18/2020 10:17   No results found for this or any  previous visit (from the past 240 hour(s)).  Microbiology: No results found for this or any previous visit (from the past 240 hour(s)).  Radiographs and labs were personally reviewed by me.   Bobby Rumpf, MD [facility] [phone] 04/18/2020, 4:14 PM

## 2020-04-18 NOTE — Progress Notes (Signed)
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  Developed acute respiratory failure secondary pneumonia as well as blood loss anemia from epistaxis.  Underwent EGD and colonoscopy showing stercoral ulcer and gastric ulcer.  Imaging suggested lung mass concerning for malignancy, abnormal pancreatic mass, thyroid nodule.  MRI obtained however more suggestive of fluid and infection rather than mass.  Discussed with neurosurgery, recommended continuing antibiotics, no operative intervention, no indication for IR drainage.  Plan to work-up pancreas with MR of the abdomen.  SNF recommended but patient has no insurance and is homeless.  Difficult to place patient.  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 --Continue antibiotics through 2/22. --Continue OT for triceps activation.  Follow-up with [facility] as an outpatient.  Reported nausea vomiting.  Etiology and significance unclear.  Lipase within normal limits.  Exam benign.  X-ray negative although I do think she has significant stool burden.  Continue supportive care.  Diabetes mellitus type 2 with hemoglobin A1c 10.4. -- blood sugars stable.  AKI superimposed on CKD stage IIIb -baseline unclear, but probably around 1.7. Creatinine remains in 2s with slight improvement today.  Discussed in detail with nephrology, reviewed chart, no recommendations, no identifiable reversible condition.   --Continue to monitor.  Hypokalemia, potassium  2.8 --Borderline.  Replete.  Possible right apical lung mass, possible pathologic fracture T1 transverse process, abnormal appearance T7 and T8 vertebral bodies concerning for metastatic disease. --On MRI, findings most consistent with fluid and infection per my discussion with radiologist today.  Discussed with neurosurgery Dr. Reatha Armour, who recommended ongoing antibiotics, no role for surgical intervention or IR drainage. --Unclear whether right apical mass is fluid or lesion.  Will discuss further with specialist. --Updated infectious disease.  Abnormal appearance of the pancreas, concerning for pancreatic tail mass --Obtain MRI abdomen.  Not a candidate for CT contrast.  Generalized weakness with recurrent falls in the setting of hypercalcemia, bacteremia and DKA.  MRI brain negative. --unsafe d/c, cannot care for self, no support, homeless, no insurance. No bed offers from SNF.  Difficult to place.  Left-sided thyroid nodule minimally suppressed TSH with low free T3 and T4 within normal limits. --Recommend outpatient ultrasound  Acute on chronic normocytic anemia, Hemoccult positive but no overt GI bleed.  Earlier in hospitalization had epistaxis and coagulopathy, gum bleeding. Has required multiple transfusions. EGD/colonoscopy unrevealing. Hematology without specific recs at this point. Bone marrow suppression suspected as well. --periodic CBC planned.  Lower extremity edema is probably secondary to hypoalbuminemia.  Echocardiogram was unremarkable. --compression hose, supportive care  Aortic atherosclerosis --No treatment indicated.  Primary hyperparathyroidism.  Followed by endocrinology as an outpatient, noted to be a candidate for surgery prior to admission.   --Follow-up with ENT after completion of treatment for bacteremia. --Calcium starting to trend back up.  Follow.  May need additional pamidronate.  Stercoral ulcer, fecal impaction noted 1/28.  At that time aggressive  bowel regimen recommended to keep stool soft and prevent recurrence of fecal impaction.   --X-ray suggests retained stool.  Enema.  Rectovaginal fistula?Marland Kitchen  Stool noted from the vagina?.  Consider GYN evaluation as outpatient.  Not clear whether this is actual. --Follow-up as an outpatient.  Appears to be asymptomatic at this time.  RESOLVED . Sepsis secondary to MSSA bacteremia  . DKA . Severe hypercalcemia superimposed on chronic I per calcium he is secondary to primary hyperparathyroidism. Followed by endocrinology as an outpatient. Treated with bisphosphonate and calcitonin. . Acute hypoxemic respiratory failure with uncompensated respiratory acidosis secondary to bilateral lower lobe pneumonia . Acute metabolic encephalopathy.  MRI brain no acute finding. . Oropharyngeal thrush  Nutritional Assessment: Body mass index is 28.87 kg/m.Marland Kitchen Seen by dietician.  I agree with the assessment and plan as outlined below: Nutrition Status: Nutrition Problem: Increased nutrient needs Etiology: acute illness (sepsis) Signs/Symptoms: estimated needs Interventions: Prostat,MVI,Glucerna shake  Disposition Plan:  Discussion: Complex case.  Greater than 35 minutes spent, greater than 50% counseling coordination of care.  Discussion with radiologist, neurosurgeon, pulmonology, infectious disease.  Status is: Inpatient  Remains inpatient appropriate because:Unsafe d/c plan   Dispo: The patient is from: Home              Anticipated d/c is to: uncertain              Anticipated d/c date is: > 3 days              Patient currently is not medically stable to d/c.   Difficult to place patient Yes  DVT prophylaxis: SCDs Start: 04/08/2020 1608   Code Status: Full Code Level of care: Med-Surg Family Communication: none  Murray Hodgkins, MD  [facility] Direct contact: see [email or web address] (further directions at bottom of note if needed) 7PM-7AM contact night coverage as at bottom of note 04/18/2020,  3:02 PM  LOS: 28 days   Significant Hospital Events   . 1/8 admit for DKA, falls, AKI, hypercalcemia . 1/9 MSSA bacteremia, ID consult, ortho consult for elbow infection . 1/10 I&D . 1/13 ID signs off . 1/25 GI consult for anemia . 1/25 oncology consult   Consults:  . ID . Ortho . GI   Procedures:  . 1/10 I&D left septic olecranon bursitis including osteo of the olecranon . 1/28 colonoscopy.  Fecal impaction with associated stercoral ulcer.  Manual disimpaction.  Polyp. . 1/28 EGD.  Linear esophageal ulcer, nonbleeding gastric ulcer, erosive gastropathy, nonbleeding duodenal ulcers  Significant Diagnostic Tests:  . 1/11 TEE no endocarditis  Interval History/Subjective  CC: f/u infection  Reports nausea again today.  No abdominal pain.  No bowel movement today.  Objective   Vitals:  Vitals:   04/18/20 0659 04/18/20 1415  BP: (!) 147/83 (!) 92/52  Pulse: 85 74  Resp: 20 16  Temp: 97.7 F (36.5 C) (!) 97.5 F (36.4 C)  SpO2: 100% 99%    Exam:  Constitutional:   . Appears calm and comfortable ENMT:  . grossly normal hearing  Respiratory:  . CTA bilaterally, no w/r/r.  . Respiratory effort normal.  Cardiovascular:  . RRR, no m/r/g . No change in 2+ BLE extremity edema   Abdomen:  . Soft, ntnd, obese Musculoskeletal:  . RUE, LUE, RLE, LLE   . Strength globally weak, tone normal Psychiatric:  . Mental status o Mood appropriate, affect odd  I have personally reviewed the following:   Today's Data  . CBG stable  CBG stable.  Potassium 3.6.  Creatinine slightly improved at 2.06.  Calcium starting to trend back up, 10.6.  Lipase within normal limits.  Transaminases unremarkable.  Total biliruben within normal limits.  Abdominal x-ray read as negative.  To my eye significant stool burden is seen.  Scheduled Meds: . (feeding supplement) PROSource Plus  30 mL Oral TID BM  . feeding supplement (GLUCERNA SHAKE)  237 mL Oral TID BM  . folic acid  1 mg Oral  Daily  . insulin aspart  0-15 Units Subcutaneous Q4H  . metoprolol succinate  100 mg Oral Daily  . nystatin  5 mL Oral QID  . nystatin   Topical TID  . pantoprazole  40 mg Oral BID  . polyethylene glycol  17 g Oral BID  . sertraline  100 mg Oral Daily  . sodium bicarbonate  650 mg Oral TID  . sucralfate  1 g Oral TID WC & HS   Continuous Infusions: .  ceFAZolin (ANCEF) IV Stopped (04/18/20 0908)  . fluconazole (DIFLUCAN) IV 50 mL/hr at 04/18/20 1019    Principal Problem:   MSSA bacteremia Active Problems:   Hypercalcemia   Depression, major, single episode, moderate (HCC)   DKA (diabetic ketoacidosis) (HCC)   Acute pain of right shoulder   Left elbow pain   Acute kidney injury ([place])   Weakness   Recurrent falls   Sepsis due to methicillin susceptible Staphylococcus aureus ([place])   Osteomyelitis of left elbow (HCC)   Osteomyelitis of left ulna (HCC)   Sepsis (HCC)   Blood in the stool   Anemia due to GI blood loss   Coagulopathy (HCC)   Duodenal ulcer   Acute gastric ulcer without hemorrhage or perforation   Adenomatous polyp of ascending colon   Adenomatous polyp of descending colon   Stercoral ulcer of rectum   Fecal impaction (HCC)   Lung mass   Thyroid nodule   Pancreatic mass   Aortic atherosclerosis ([place])   LOS: 28 days   How to contact the [facility] Attending or Consulting provider 7A - 7P or covering provider during after hours 7P -7A, for this patient?  1. Check the care team in [facility] and look for a) attending/consulting TRH provider listed and b) the [facility] team listed 2. Log into [email or web address] and use [place] universal password to access. If you do not have the password, please contact the hospital operator. 3. Locate the [facility] provider you are looking for under [facility] and page to a number that you can be directly reached. 4. If you still have difficulty reaching the provider, please page the [facility] (Director on Call) for the Hospitalists listed on amion  for assistance.

## 2020-04-19 ENCOUNTER — Inpatient Hospital Stay (HOSPITAL_COMMUNITY): Payer: Medicaid Other

## 2020-04-19 DIAGNOSIS — M4620 Osteomyelitis of vertebra, site unspecified: Secondary | ICD-10-CM

## 2020-04-19 DIAGNOSIS — M4640 Discitis, unspecified, site unspecified: Secondary | ICD-10-CM

## 2020-04-19 DIAGNOSIS — J852 Abscess of lung without pneumonia: Secondary | ICD-10-CM

## 2020-04-19 LAB — GLUCOSE, CAPILLARY
Glucose-Capillary: 100 mg/dL — ABNORMAL HIGH (ref 70–99)
Glucose-Capillary: 111 mg/dL — ABNORMAL HIGH (ref 70–99)
Glucose-Capillary: 117 mg/dL — ABNORMAL HIGH (ref 70–99)
Glucose-Capillary: 122 mg/dL — ABNORMAL HIGH (ref 70–99)
Glucose-Capillary: 131 mg/dL — ABNORMAL HIGH (ref 70–99)
Glucose-Capillary: 165 mg/dL — ABNORMAL HIGH (ref 70–99)

## 2020-04-19 LAB — COMPREHENSIVE METABOLIC PANEL
ALT: <5 U/L (ref 0–44)
AST: 10 U/L — ABNORMAL LOW (ref 15–41)
Albumin: 1.6 g/dL — ABNORMAL LOW (ref 3.5–5.0)
Alkaline Phosphatase: 77 U/L (ref 38–126)
Anion gap: 14 (ref 5–15)
BUN: 58 mg/dL — ABNORMAL HIGH (ref 8–23)
CO2: 16 mmol/L — ABNORMAL LOW (ref 22–32)
Calcium: 11.1 mg/dL — ABNORMAL HIGH (ref 8.9–10.3)
Chloride: 110 mmol/L (ref 98–111)
Creatinine, Ser: 2.65 mg/dL — ABNORMAL HIGH (ref 0.44–1.00)
GFR, Estimated: 20 mL/min — ABNORMAL LOW (ref >60)
Glucose, Bld: 105 mg/dL — ABNORMAL HIGH (ref 70–99)
Potassium: 3.6 mmol/L (ref 3.5–5.1)
Sodium: 140 mmol/L (ref 135–145)
Total Bilirubin: 0.4 mg/dL (ref 0.3–1.2)
Total Protein: 6.3 g/dL — ABNORMAL LOW (ref 6.5–8.1)

## 2020-04-19 MED ORDER — CEFAZOLIN SODIUM-DEXTROSE 2-4 GM/100ML-% IV SOLN
2.0000 g | Freq: Three times a day (TID) | INTRAVENOUS | Status: DC
  Administered 2020-04-19 – 2020-04-20 (×3): 2 g via INTRAVENOUS
  Filled 2020-04-19 (×4): qty 100

## 2020-04-19 MED ORDER — MILK AND MOLASSES ENEMA
1.0000 | Freq: Once | RECTAL | Status: AC
  Administered 2020-04-19: 240 mL via RECTAL
  Filled 2020-04-19: qty 240

## 2020-04-19 MED ORDER — CEFAZOLIN SODIUM-DEXTROSE 2-4 GM/100ML-% IV SOLN: 2.0000 g | Freq: Two times a day (BID) | INTRAVENOUS | Status: DC

## 2020-04-19 NOTE — Progress Notes (Deleted)
PHARMACY NOTE:  ANTIMICROBIAL RENAL DOSAGE ADJUSTMENT  Current antimicrobial regimen includes a mismatch between antimicrobial dosage and estimated renal function.  As per policy approved by the Pharmacy & Therapeutics and Medical Executive Committees, the antimicrobial dosage will be adjusted accordingly.  Current antimicrobial dosage:  Cefazolin 2 g IV every 8 hours  Indication: MSSA bacteremia  Renal Function:   Estimated Creatinine Clearance: 27.1 mL/min (A) (by C-G formula based on SCr of 2.65 mg/dL (H)). []      On intermittent HD, scheduled: []      On CRRT    Antimicrobial dosage has been changed to:  Cefazolin 2 g IV every 12 hours  Additional comments:   Thank you for allowing pharmacy to be a part of this patient's care.  Lanyiah Brix P. Legrand Como, PharmD, [place] Please utilize Amion for appropriate phone number to reach the unit pharmacist ([place]) 04/19/2020 3:06 PM

## 2020-04-19 NOTE — Plan of Care (Signed)
  Problem: Clinical Measurements: Goal: Respiratory complications will improve Outcome: Progressing Goal: Cardiovascular complication will be avoided Outcome: Progressing   Problem: Elimination: Goal: Will not experience complications related to urinary retention Outcome: Progressing   Problem: Pain Managment: Goal: General experience of comfort will improve Outcome: Progressing   Problem: Safety: Goal: Ability to remain free from injury will improve Outcome: Progressing   Problem: Skin Integrity: Goal: Risk for impaired skin integrity will decrease Outcome: Progressing   Problem: Metabolic: Goal: Ability to maintain appropriate glucose levels will improve Outcome: Progressing

## 2020-04-19 NOTE — Progress Notes (Signed)
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  Developed acute respiratory failure secondary pneumonia as well as blood loss anemia from epistaxis.  Underwent EGD and colonoscopy showing stercoral ulcer and gastric ulcer.  Imaging suggested lung mass concerning for malignancy, abnormal pancreatic mass, thyroid nodule.  MRI obtained however more suggestive of fluid and infection rather than mass.  Discussed with neurosurgery, recommended continuing antibiotics, no operative intervention, no indication for IR drainage.  Plan to work-up pancreas with MR of the abdomen.  SNF recommended but patient has no insurance and is homeless.  Difficult to place patient.  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 --Continue antibiotics through 2/22. --Continue OT for triceps activation.  Follow-up with [facility] as an outpatient.  C6-T2 and T7/8 discitis, osteomyelitis.  Discussed with neurosurgery Dr. Reatha Armour, who recommended ongoing antibiotics, no role for surgical intervention or IR drainage. --Per ID considering prolonged length of therapy to 8 weeks and oral antibiotics after that.  Repeat imaging in 8 weeks. --Consider lumbar MRI tomorrow  Lung abscess right apex.  Discussed with pulmonology.  Recommends antibiotics, repeat study in the outpatient setting to ensure resolution.  Could consider IR aspiration.  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  Not a candidate for CT contrast.  AKI superimposed on CKD stage IIIb -baseline unclear, but probably around 1.7. Creatinine remains in 2s with slight improvement today.  Discussed in detail with nephrology, reviewed chart, no recommendations, no identifiable reversible condition.   --Creatinine variable but overall stable.  Primary hyperparathyroidism with hypercalcemia.  Followed by endocrinology as an outpatient, noted to be a candidate for surgery prior to admission.   --Follow-up with ENT after completion of treatment for bacteremia. --Calcium trending back up.  Discussed with nephrology, avoid pamidronate. --If trends higher will need to consider alternative therapy.  Reported nausea, vomiting.  Etiology and significance unclear.  Lipase within normal limits.  Exam remains benign.  X-ray negative except for stool burden.  Repeat enema today.  Diabetes mellitus type 2 with hemoglobin A1c 10.4. -- blood sugars remain stable.  Generalized weakness with recurrent falls in the setting of hypercalcemia, bacteremia and DKA.  MRI brain negative. --unsafe d/c, cannot care for self, no support, homeless, no insurance. No bed offers from SNF.  Difficult to place.  Left-sided thyroid nodule minimally suppressed TSH with low free T3 and T4 within normal limits. --Recommend outpatient ultrasound, however given difficult disposition, will obtain in-house  Acute on chronic normocytic anemia, Hemoccult positive but no overt GI bleed.  Earlier in hospitalization had epistaxis and coagulopathy, gum bleeding. Has required multiple transfusions. EGD/colonoscopy unrevealing. Hematology without specific recs at this point. Bone marrow suppression suspected as well. --Has remained stable.  Periodic CBC.  Lower extremity edema is probably secondary to hypoalbuminemia.  Echocardiogram was unremarkable. --.  Looks better today.  Add compression hose, continue supportive care  Aortic  atherosclerosis --No treatment indicated.  Stercoral ulcer, fecal impaction noted 1/28.  At that time aggressive bowel regimen recommended to keep  stool soft and prevent recurrence of fecal impaction.   --Asymptomatic other than perhaps nausea.  Enema.  Rectovaginal fistula?.  Stool noted from the vagina?.  Consider GYN evaluation as outpatient.  Not clear whether this is actual. --Follow-up as an outpatient.  Appears to be asymptomatic at this time.  RESOLVED . Sepsis secondary to MSSA bacteremia  . DKA . Severe hypercalcemia superimposed on chronic I per calcium he is secondary to primary hyperparathyroidism. Followed by endocrinology as an outpatient. Treated with bisphosphonate and calcitonin. . Acute hypoxemic respiratory failure with uncompensated respiratory acidosis secondary to bilateral lower lobe pneumonia . Acute metabolic encephalopathy.  MRI brain no acute finding. . Oropharyngeal thrush  Nutritional Assessment: Body mass index is 28.87 kg/m.Marland Kitchen Seen by dietician.  I agree with the assessment and plan as outlined below: Nutrition Status: Nutrition Problem: Increased nutrient needs Etiology: acute illness (sepsis) Signs/Symptoms: estimated needs Interventions: Prostat,MVI,Glucerna shake  Disposition Plan:  Discussion: Complex case.  Plan as above.  Status is: Inpatient  Remains inpatient appropriate because:Unsafe d/c plan   Dispo: The patient is from: Home              Anticipated d/c is to: uncertain              Anticipated d/c date is: > 3 days              Patient currently is not medically stable to d/c.   Difficult to place patient Yes  DVT prophylaxis: Place TED hose Start: 04/19/20 1604 SCDs Start: 04/01/2020 1608   Code Status: Full Code Level of care: Med-Surg Family Communication: none  Murray Hodgkins, MD  [facility] Direct contact: see [email or web address] (further directions at bottom of note if needed) 7PM-7AM contact night coverage as at bottom  of note 04/19/2020, 4:07 PM  LOS: 29 days   Significant Hospital Events   . 1/8 admit for DKA, falls, AKI, hypercalcemia . 1/9 MSSA bacteremia, ID consult, ortho consult for elbow infection . 1/10 I&D . 1/13 ID signs off . 1/25 GI consult for anemia . 1/25 oncology consult   Consults:  . ID . Ortho . GI   Procedures:  . 1/10 I&D left septic olecranon bursitis including osteo of the olecranon . 1/28 colonoscopy.  Fecal impaction with associated stercoral ulcer.  Manual disimpaction.  Polyp. . 1/28 EGD.  Linear esophageal ulcer, nonbleeding gastric ulcer, erosive gastropathy, nonbleeding duodenal ulcers  Significant Diagnostic Tests:  . 1/11 TEE no endocarditis  Interval History/Subjective  CC: f/u infection  Continues to have some nausea.  Seems to be tolerating diet.  Had a bowel movement after enema yesterday. No difficulty breathing.  Objective   Vitals:  Vitals:   04/19/20 0407 04/19/20 1335  BP: 114/67 113/64  Pulse: 80 73  Resp: 20 (!) 21  Temp: 97.7 F (36.5 C) (!) 97.4 F (36.3 C)  SpO2: 99% 99%    Exam: Constitutional:   . Appears calm and comfortable ENMT:  . grossly normal hearing  Respiratory:  . CTA bilaterally, no w/r/r.  . Respiratory effort normal. Cardiovascular:  . RRR, no m/r/g . Decreased BLE LE edema left greater than right Abdomen:  . Soft ntnd Psychiatric:  . Mental status o Mood appropriate, affect odd  I have personally reviewed the following:   Today's Data   CBG stable.  Creatinine variable but overall stable.  Scheduled Meds: . (feeding supplement) PROSource Plus  30 mL Oral TID BM  . feeding supplement (GLUCERNA SHAKE)  237 mL  Oral TID BM  . folic acid  1 mg Oral Daily  . insulin aspart  0-15 Units Subcutaneous Q4H  . metoprolol succinate  100 mg Oral Daily  . milk and molasses  1 enema Rectal Once  . nystatin  5 mL Oral QID  . nystatin   Topical TID  . pantoprazole  40 mg Oral BID  . polyethylene glycol  17 g Oral  BID  . sertraline  100 mg Oral Daily  . sodium bicarbonate  650 mg Oral TID  . sucralfate  1 g Oral TID WC & HS   Continuous Infusions: .  ceFAZolin (ANCEF) IV    . fluconazole (DIFLUCAN) IV Stopped (04/19/20 1115)    Principal Problem:   MSSA bacteremia Active Problems:   Hypercalcemia   Depression, major, single episode, moderate (HCC)   DKA (diabetic ketoacidosis) (HCC)   Acute pain of right shoulder   Left elbow pain   Acute kidney injury ([place])   Weakness   Recurrent falls   Sepsis due to methicillin susceptible Staphylococcus aureus ([place])   Osteomyelitis of left elbow (HCC)   Osteomyelitis of left ulna (HCC)   Sepsis (HCC)   Blood in the stool   Anemia due to GI blood loss   Coagulopathy (HCC)   Duodenal ulcer   Acute gastric ulcer without hemorrhage or perforation   Adenomatous polyp of ascending colon   Adenomatous polyp of descending colon   Stercoral ulcer of rectum   Fecal impaction (HCC)   Lung mass   Thyroid nodule   Pancreatic mass   Aortic atherosclerosis ([place])   Discitis   Vertebral osteomyelitis ([place])   Lung abscess ([place])   LOS: 29 days   How to contact the [facility] Attending or Consulting provider 7A - 7P or covering provider during after hours 7P -7A, for this patient?  1. Check the care team in [facility] and look for a) attending/consulting TRH provider listed and b) the [facility] team listed 2. Log into [email or web address] and use [place] universal password to access. If you do not have the password, please contact the hospital operator. 3. Locate the [facility] provider you are looking for under [facility] and page to a number that you can be directly reached. 4. If you still have difficulty reaching the provider, please page the [facility] (Director on Call) for the Hospitalists listed on amion for assistance.

## 2020-04-20 ENCOUNTER — Inpatient Hospital Stay (HOSPITAL_COMMUNITY): Payer: Medicaid Other

## 2020-04-20 ENCOUNTER — Encounter (HOSPITAL_COMMUNITY): Payer: Self-pay | Admitting: Internal Medicine

## 2020-04-20 DIAGNOSIS — M7021 Olecranon bursitis, right elbow: Secondary | ICD-10-CM

## 2020-04-20 DIAGNOSIS — M4640 Discitis, unspecified, site unspecified: Secondary | ICD-10-CM

## 2020-04-20 DIAGNOSIS — M86131 Other acute osteomyelitis, right radius and ulna: Secondary | ICD-10-CM

## 2020-04-20 DIAGNOSIS — R600 Localized edema: Secondary | ICD-10-CM

## 2020-04-20 LAB — LIPASE, BLOOD: Lipase: 40 U/L (ref 11–51)

## 2020-04-20 LAB — MRSA PCR SCREENING: MRSA by PCR: NEGATIVE

## 2020-04-20 LAB — LACTIC ACID, PLASMA
Lactic Acid, Venous: 2 mmol/L (ref 0.5–1.9)
Lactic Acid, Venous: 1.7 mmol/L (ref 0.5–1.9)

## 2020-04-20 LAB — URINALYSIS, ROUTINE W REFLEX MICROSCOPIC
Bilirubin Urine: NEGATIVE
Glucose, UA: NEGATIVE mg/dL
Ketones, ur: 5 mg/dL — AB
Nitrite: NEGATIVE
Protein, ur: 30 mg/dL — AB
Specific Gravity, Urine: 1.016 (ref 1.005–1.030)
WBC, UA: >50 WBC/hpf — ABNORMAL HIGH (ref 0–5)
pH: 5 (ref 5.0–8.0)

## 2020-04-20 LAB — GLUCOSE, CAPILLARY
Glucose-Capillary: 102 mg/dL — ABNORMAL HIGH (ref 70–99)
Glucose-Capillary: 111 mg/dL — ABNORMAL HIGH (ref 70–99)
Glucose-Capillary: 116 mg/dL — ABNORMAL HIGH (ref 70–99)
Glucose-Capillary: 125 mg/dL — ABNORMAL HIGH (ref 70–99)
Glucose-Capillary: 135 mg/dL — ABNORMAL HIGH (ref 70–99)
Glucose-Capillary: 142 mg/dL — ABNORMAL HIGH (ref 70–99)

## 2020-04-20 LAB — DIC (DISSEMINATED INTRAVASCULAR COAGULATION)PANEL
D-Dimer, Quant: 3.4 ug/mL-FEU — ABNORMAL HIGH (ref 0.00–0.50)
Fibrinogen: 369 mg/dL (ref 210–475)
INR: >10 (ref 0.8–1.2)
Platelets: 212 10*3/uL (ref 150–400)
Prothrombin Time: 78.9 seconds — ABNORMAL HIGH (ref 11.4–15.2)
Smear Review: NONE SEEN
aPTT: 156 seconds — ABNORMAL HIGH (ref 24–36)

## 2020-04-20 LAB — SODIUM, URINE, RANDOM: Sodium, Ur: 37 mmol/L

## 2020-04-20 LAB — TROPONIN I (HIGH SENSITIVITY): Troponin I (High Sensitivity): 11 ng/L (ref <18)

## 2020-04-20 LAB — CBC
HCT: 29.3 % — ABNORMAL LOW (ref 36.0–46.0)
Hemoglobin: 8.4 g/dL — ABNORMAL LOW (ref 12.0–15.0)
MCH: 27.6 pg (ref 26.0–34.0)
MCHC: 28.7 g/dL — ABNORMAL LOW (ref 30.0–36.0)
MCV: 96.4 fL (ref 80.0–100.0)
Platelets: 227 10*3/uL (ref 150–400)
RBC: 3.04 MIL/uL — ABNORMAL LOW (ref 3.87–5.11)
RDW: 19.6 % — ABNORMAL HIGH (ref 11.5–15.5)
WBC: 13.4 10*3/uL — ABNORMAL HIGH (ref 4.0–10.5)
nRBC: 0 % (ref 0.0–0.2)

## 2020-04-20 LAB — COMPREHENSIVE METABOLIC PANEL
ALT: <5 U/L (ref 0–44)
AST: 9 U/L — ABNORMAL LOW (ref 15–41)
Albumin: 1.6 g/dL — ABNORMAL LOW (ref 3.5–5.0)
Alkaline Phosphatase: 77 U/L (ref 38–126)
Anion gap: 22 — ABNORMAL HIGH (ref 5–15)
BUN: 60 mg/dL — ABNORMAL HIGH (ref 8–23)
CO2: 11 mmol/L — ABNORMAL LOW (ref 22–32)
Calcium: 11.3 mg/dL — ABNORMAL HIGH (ref 8.9–10.3)
Chloride: 107 mmol/L (ref 98–111)
Creatinine, Ser: 2.83 mg/dL — ABNORMAL HIGH (ref 0.44–1.00)
GFR, Estimated: 18 mL/min — ABNORMAL LOW (ref >60)
Glucose, Bld: 151 mg/dL — ABNORMAL HIGH (ref 70–99)
Potassium: 3.4 mmol/L — ABNORMAL LOW (ref 3.5–5.1)
Sodium: 140 mmol/L (ref 135–145)
Total Bilirubin: 0.7 mg/dL (ref 0.3–1.2)
Total Protein: 6.2 g/dL — ABNORMAL LOW (ref 6.5–8.1)

## 2020-04-20 LAB — BASIC METABOLIC PANEL
Anion gap: 19 — ABNORMAL HIGH (ref 5–15)
BUN: 60 mg/dL — ABNORMAL HIGH (ref 8–23)
CO2: 13 mmol/L — ABNORMAL LOW (ref 22–32)
Calcium: 11.3 mg/dL — ABNORMAL HIGH (ref 8.9–10.3)
Chloride: 108 mmol/L (ref 98–111)
Creatinine, Ser: 2.91 mg/dL — ABNORMAL HIGH (ref 0.44–1.00)
GFR, Estimated: 17 mL/min — ABNORMAL LOW (ref >60)
Glucose, Bld: 117 mg/dL — ABNORMAL HIGH (ref 70–99)
Potassium: 3.4 mmol/L — ABNORMAL LOW (ref 3.5–5.1)
Sodium: 140 mmol/L (ref 135–145)

## 2020-04-20 LAB — PROTIME-INR
INR: 9.6 (ref 0.8–1.2)
Prothrombin Time: 75 seconds — ABNORMAL HIGH (ref 11.4–15.2)

## 2020-04-20 LAB — CORTISOL-AM, BLOOD: Cortisol - AM: 31.6 ug/dL — ABNORMAL HIGH (ref 6.7–22.6)

## 2020-04-20 LAB — APTT: aPTT: 156 seconds — ABNORMAL HIGH (ref 24–36)

## 2020-04-20 LAB — CREATININE, URINE, RANDOM: Creatinine, Urine: 65.81 mg/dL

## 2020-04-20 MED ORDER — PIPERACILLIN-TAZOBACTAM 3.375 G IVPB: 3.3750 g | Freq: Three times a day (TID) | INTRAVENOUS | Status: DC

## 2020-04-20 MED ORDER — LACTATED RINGERS IV BOLUS
1000.0000 mL | Freq: Once | INTRAVENOUS | Status: AC
  Administered 2020-04-20: 1000 mL via INTRAVENOUS

## 2020-04-20 MED ORDER — METRONIDAZOLE IN NACL 5-0.79 MG/ML-% IV SOLN
500.0000 mg | Freq: Three times a day (TID) | INTRAVENOUS | Status: DC
  Administered 2020-04-20 – 2020-04-24 (×11): 500 mg via INTRAVENOUS
  Filled 2020-04-20 (×11): qty 100

## 2020-04-20 MED ORDER — LACTATED RINGERS IV SOLN: INTRAVENOUS | Status: DC

## 2020-04-20 MED ORDER — SODIUM CHLORIDE 0.9 % IV BOLUS
1000.0000 mL | Freq: Once | INTRAVENOUS | Status: AC
  Administered 2020-04-20: 1000 mL via INTRAVENOUS

## 2020-04-20 MED ORDER — SODIUM CHLORIDE 0.9 % IV SOLN
2.0000 g | INTRAVENOUS | Status: DC
  Filled 2020-04-20: qty 2

## 2020-04-20 MED ORDER — SODIUM BICARBONATE 650 MG PO TABS
1300.0000 mg | ORAL_TABLET | Freq: Four times a day (QID) | ORAL | Status: DC
  Administered 2020-04-20 – 2020-04-21 (×2): 1300 mg via ORAL
  Filled 2020-04-20 (×2): qty 2

## 2020-04-20 MED ORDER — PANTOPRAZOLE SODIUM 40 MG IV SOLR
40.0000 mg | Freq: Two times a day (BID) | INTRAVENOUS | Status: DC
  Administered 2020-04-20 – 2020-04-24 (×8): 40 mg via INTRAVENOUS
  Filled 2020-04-20 (×8): qty 40

## 2020-04-20 MED ORDER — HYDROCORTISONE NA SUCCINATE PF 100 MG IJ SOLR
50.0000 mg | Freq: Four times a day (QID) | INTRAMUSCULAR | Status: DC
  Administered 2020-04-20 – 2020-04-24 (×16): 50 mg via INTRAVENOUS
  Filled 2020-04-20 (×16): qty 2

## 2020-04-20 MED ORDER — SODIUM CHLORIDE 0.9 % IV SOLN
2.0000 g | INTRAVENOUS | Status: DC
  Administered 2020-04-20 – 2020-04-22 (×3): 2 g via INTRAVENOUS
  Filled 2020-04-20 (×3): qty 2

## 2020-04-20 MED ORDER — METRONIDAZOLE IN NACL 5-0.79 MG/ML-% IV SOLN
500.0000 mg | Freq: Three times a day (TID) | INTRAVENOUS | Status: DC
  Administered 2020-04-20: 500 mg via INTRAVENOUS
  Filled 2020-04-20: qty 100

## 2020-04-20 MED ORDER — CHLORHEXIDINE GLUCONATE CLOTH 2 % EX PADS
6.0000 | MEDICATED_PAD | Freq: Every day | CUTANEOUS | Status: DC
  Administered 2020-04-20 – 2020-04-24 (×4): 6 via TOPICAL

## 2020-04-20 MED ORDER — CALCITONIN (SALMON) 200 UNIT/ML IJ SOLN: 4.0000 [IU]/kg | Freq: Two times a day (BID) | INTRAMUSCULAR | Status: DC

## 2020-04-20 MED ORDER — VITAMIN K1 10 MG/ML IJ SOLN
10.0000 mg | Freq: Every day | INTRAMUSCULAR | Status: DC
  Administered 2020-04-20 – 2020-04-22 (×3): 10 mg via SUBCUTANEOUS
  Filled 2020-04-20 (×4): qty 1

## 2020-04-20 MED ORDER — ORAL CARE MOUTH RINSE
15.0000 mL | Freq: Two times a day (BID) | OROMUCOSAL | Status: DC
  Administered 2020-04-20 – 2020-04-22 (×5): 15 mL via OROMUCOSAL

## 2020-04-20 NOTE — Consult Note (Addendum)
Deborah Figueroa  [phone]) [phone] Fax:([phone]) [place]  Referring MD:  Dr. Murray Hodgkins  Reason for Referral: Coagulopathy  HPI: Deborah Figueroa is a 65 year old female with a past medical history significant for diabetes lightest type II, hypertension, hyperlipidemia.  She presented to the emergency room with weakness and had multiple falls with injury.  She was found to be in DKA on admission and started on insulin drip.  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  Hematology/oncology was initially consulted on 04/07/2020 for normocytic normochromic anemia with mild coagulopathy and right lung mass.  There was no evidence of hemolysis or overt DIC at that time.  Mild coagulopathy was thought to be due to ongoing infection.  We were asked to reconsult on this patient today for coagulopathy.  Per PT this morning was elevated at 75, INR 9.6, PTT 156.  She is currently not receiving anticoagulation.  The patient was seen today and denies any evidence of bleeding such as epistaxis, bleeding gums, hemoptysis, hematemesis, hematuria, melena or hematochezia.  She is not having any fevers or chills.  Denies chest pain, shortness of breath, abdominal pain, nausea, vomiting.  Hematology was asked to the patient made recommendations regarding her coagulopathy.  Past Medical History:  Diagnosis Date  . Cellulitis   . Depression   . Diabetes mellitus without complication ([place])   . Hyperlipidemia   . Hypertension   . Lung mass 04/15/2020  . Pancreatic mass 04/15/2020  . Renal insufficiency   . Seasonal allergies   . Thyroid nodule 04/15/2020  :    Past Surgical History:  Procedure Laterality Date  . BACK SURGERY    . BIOPSY  04/06/2020   Procedure: BIOPSY;   Surgeon: Thornton Park, MD;  Location: [facility];  Service: Gastroenterology;;  . BREAST SURGERY     lumpectomy left breast  . COLONOSCOPY WITH PROPOFOL N/A 03/21/2020   Procedure: COLONOSCOPY WITH PROPOFOL;  Surgeon: Thornton Park, MD;  Location: [facility];  Service: Gastroenterology;  Laterality: N/A;  . ESOPHAGOGASTRODUODENOSCOPY (EGD) WITH PROPOFOL N/A 03/19/2020   Procedure: ESOPHAGOGASTRODUODENOSCOPY (EGD) WITH PROPOFOL;  Surgeon: Thornton Park, MD;  Location: [facility];  Service: Gastroenterology;  Laterality: N/A;  . I & D EXTREMITY Left 03/22/2020   Procedure: IRRIGATION AND DEBRIDEMENT elbow;  Surgeon: Tania Ade, MD;  Location: [facility];  Service: Orthopedics;  Laterality: Left;  . LUMBAR FUSION    . POLYPECTOMY  03/30/2020   Procedure: POLYPECTOMY;  Surgeon: Thornton Park, MD;  Location: [facility];  Service: Gastroenterology;;  . TEE WITHOUT CARDIOVERSION N/A 04/07/2020   Procedure: TRANSESOPHAGEAL ECHOCARDIOGRAM (TEE);  Surgeon: Buford Dresser, MD;  Location: [facility] END[facility]COPY;  Service: Cardiovascular;  Laterality: N/A;  :   CURRENT MEDS: Current Facility-Administered Medications  Medication Dose Route Frequency Provider Last Rate Last Admin  . (feeding supplement) PROSource Plus liquid 30 mL  30 mL Oral TID BM Samuella Cota, MD   30 mL at 04/12/20 1659  . acetaminophen (TYLENOL) tablet 650 mg  650 mg Oral Q6H PRN Samuella Cota, MD   650 mg at 04/20/20 1147  . ceFEPIme (MAXIPIME) 2 g in sodium chloride 0.9 % 100 mL IVPB  2 g Intravenous Q24H Shade, Haze Justin, RPH      . Chlorhexidine Gluconate Cloth 2 % PADS 6 each  6  each Topical Daily Samuella Cota, MD   6 each at 04/20/20 1111  . dextrose 50 % solution 0-50 mL  0-50 mL Intravenous PRN Samuella Cota, MD      . feeding supplement (GLUCERNA SHAKE) (GLUCERNA SHAKE) liquid 237 mL  237 mL Oral TID BM Samuella Cota, MD   237 mL at 04/18/20 2050  . folic acid (FOLVITE) tablet  1 mg  1 mg Oral Daily Samuella Cota, MD   1 mg at 04/19/20 1017  . gadobutrol (GADAVIST) 1 MMOL/ML injection 9 mL  9 mL Intravenous Once PRN Samuella Cota, MD      . insulin aspart (novoLOG) injection 0-15 Units  0-15 Units Subcutaneous Q4H Samuella Cota, MD   2 Units at 04/20/20 [phone]  . liver oil-zinc oxide (DESITIN) 40 % ointment   Topical PRN Samuella Cota, MD   1 application at 38/10/17 1430  . MEDLINE mouth rinse  15 mL Mouth Rinse BID Samuella Cota, MD   15 mL at 04/20/20 1111  . metroNIDAZOLE (FLAGYL) IVPB 500 mg  500 mg Intravenous Q8H Campbell Riches, MD      . nystatin (MYCOSTATIN) 100000 UNIT/ML suspension 500,000 Units  5 mL Oral QID Samuella Cota, MD   500,000 Units at 04/19/20 2228  . nystatin (MYCOSTATIN/NYSTOP) topical powder   Topical TID Samuella Cota, MD   Given at 04/20/20 1019  . ondansetron (ZOFRAN) tablet 4 mg  4 mg Oral Q6H PRN Samuella Cota, MD   4 mg at 04/16/20 0650   Or  . ondansetron [facility]) injection 4 mg  4 mg Intravenous Q6H PRN Samuella Cota, MD   4 mg at 04/20/20 5102  . oxymetazoline (AFRIN) 0.05 % nasal spray 3 spray  3 spray Each Nare BID PRN Samuella Cota, MD   3 spray at 04/01/20 1800  . pantoprazole (PROTONIX) EC tablet 40 mg  40 mg Oral BID Samuella Cota, MD   40 mg at 04/19/20 2228  . phenol (CHLORASEPTIC) mouth spray 1 spray  1 spray Mouth/Throat PRN Samuella Cota, MD      . polyethylene glycol (MIRALAX / GLYCOLAX) packet 17 g  17 g Oral BID Samuella Cota, MD   17 g at 04/19/20 2227  . prochlorperazine (COMPAZINE) injection 10 mg  10 mg Intravenous Q6H PRN Samuella Cota, MD   10 mg at 04/18/20 1007  . sertraline (ZOLOFT) tablet 100 mg  100 mg Oral Daily Samuella Cota, MD   100 mg at 04/19/20 1017  . sodium bicarbonate tablet 650 mg  650 mg Oral TID Samuella Cota, MD   650 mg at 04/19/20 2228  . sucralfate (CARAFATE) tablet 1 g  1 g Oral TID WC & HS Samuella Cota, MD    1 g at 04/19/20 0846  . traZODone (DESYREL) tablet 50 mg  50 mg Oral QHS PRN Samuella Cota, MD          No Known Allergies:  Family History  Problem Relation Age of Onset  . Stroke Mother   . Hypertension Mother   . Hyperlipidemia Mother   . Cancer Father 18       liver and colon  . Depression Father   . Cancer Maternal Aunt        breast  . Diabetes Brother   . Cancer Maternal Grandmother        breast  :  Social  History   Socioeconomic History  . Marital status: Divorced    Spouse name: Not on file  . Number of children: Not on file  . Years of education: Not on file  . Highest education level: Not on file  Occupational History  . Not on file  Tobacco Use  . Smoking status: Never Smoker  . Smokeless tobacco: Never Used  Vaping Use  . Vaping Use: Never used  Substance and Sexual Activity  . Alcohol use: No  . Drug use: No  . Sexual activity: Not on file  Other Topics Concern  . Not on file  Social History Narrative  . Not on file   Social Determinants of Health   Financial Resource Strain: Not on file  Food Insecurity: Not on file  Transportation Needs: Not on file  Physical Activity: Not on file  Stress: Not on file  Social Connections: Not on file  Intimate Partner Violence: Not on file  :  REVIEW OF SYSTEMS:  A comprehensive 14 point review of systems was negative except as noted in the HPI.    Exam: Patient Vitals for the past 24 hrs:  BP Temp Temp src Pulse Resp SpO2 Height Weight  04/20/20 1400 (!) 85/45 -- -- 70 14 100 % -- --  04/20/20 1330 (!) 86/41 -- -- 73 15 98 % -- --  04/20/20 1242 (!) 82/37 -- -- 68 16 100 % -- --  04/20/20 1212 (!) 90/40 -- -- 72 18 100 % -- --  04/20/20 1145 (!) 91/51 -- -- 67 17 100 % -- --  04/20/20 1106 (!) 95/46 -- -- 76 (!) 23 99 % -- --  04/20/20 1100 -- (!) 97.5 F (36.4 C) Oral -- -- -- 5\' 11"  (1.803 m) 101.5 kg  04/20/20 1050 (!) 87/51 -- -- -- -- -- -- --  04/20/20 0811 (!) 88/52 (!) 97.5 F  (36.4 C) Oral 77 20 100 % -- --  04/20/20 0809 (!) 84/42 -- -- -- -- -- -- --  04/20/20 0738 (!) 82/51 -- -- -- -- -- -- --  04/20/20 0647 (!) 85/43 -- -- -- -- -- -- --  04/20/20 0414 (!) 82/53 97.8 F (36.6 C) Oral 76 19 100 % -- --  04/19/20 2046 (!) 94/52 97.6 F (36.4 C) Oral 70 18 100 % -- --    General: Awake and alert, no distress Eyes:  no scleral icterus.   ENT:  There were no oropharyngeal lesions.    Lymphatics:  Negative cervical, supraclavicular or axillary adenopathy.   Respiratory: lungs were clear bilaterally without wheezing or crackles.   Cardiovascular:  Regular rate and rhythm, S1/S2, without murmur, rub or gallop.  Anasarca to the bilateral lower extremities. GI:  abdomen was soft, flat, nontender, nondistended, without organomegaly.   Musculoskeletal:  no spinal tenderness of palpation of vertebral spine.   Skin: Faint bruise below her right eye, multiple ecchymoses Neuro exam was nonfocal.  Patient was alert and oriented.  Attention was good.   Language was appropriate.  Mood was normal without depression.  Speech was not pressured.  Thought content was not tangential.    LABS:  Lab Results  Component Value Date   WBC 13.4 (H) 04/20/2020   HGB 8.4 (L) 04/20/2020   HCT 29.3 (L) 04/20/2020   PLT 227 04/20/2020   GLUCOSE 117 (H) 04/20/2020   CHOL 150 03/19/2020   TRIG 204.0 (H) 03/19/2020   HDL 16.70 (L) 03/19/2020   LDLDIRECT 46.0 03/19/2020  LDLCALC 172 (H) 09/13/2019   ALT <5 04/20/2020   AST 9 (L) 04/20/2020   NA 140 04/20/2020   K 3.4 (L) 04/20/2020   CL 108 04/20/2020   CREATININE 2.91 (H) 04/20/2020   BUN 60 (H) 04/20/2020   CO2 13 (L) 04/20/2020   INR 9.6 (HH) 04/20/2020   HGBA1C 10.4 (H) 03/19/2020    DG Wrist 2 Views Right  Result Date: 04/16/2020 CLINICAL DATA:  Right wrist pain after fall last month. EXAM: RIGHT WRIST - 2 VIEW COMPARISON:  None. FINDINGS: There is no evidence of fracture or dislocation. Severe narrowing of radiocarpal  joint is noted as well as several intercarpal joints. Moderate degenerative changes seen involving the first carpometacarpal joint. Soft tissues are unremarkable. IMPRESSION: Multifocal degenerative joint disease. No acute abnormality seen in the right wrist. Electronically Signed   By: Marijo Conception M.D.   On: 04/16/2020 17:03   CT CHEST WO CONTRAST  Result Date: 04/14/2020 CLINICAL DATA:  Unresolved pneumonia. EXAM: CT CHEST WITHOUT CONTRAST TECHNIQUE: Multidetector CT imaging of the chest was performed following the standard protocol without IV contrast. COMPARISON:  CT chest dated March 30, 2020 FINDINGS: Cardiovascular: There are atherosclerotic changes of the thoracic aorta without evidence for an aneurysm. There are coronary artery calcifications. The heart size is unremarkable. There is no significant pericardial effusion. The intracardiac blood pool is hypodense relative to the adjacent myocardium consistent with anemia. Mediastinum/Nodes: -- No mediastinal lymphadenopathy. -- No hilar lymphadenopathy. -- No axillary lymphadenopathy. -- No supraclavicular lymphadenopathy. --there is a left-sided thyroid nodule measuring approximately 2.7 cm (axial series 2, image 22). -  Unremarkable esophagus. Lungs/Pleura: There are small bilateral pleural effusions, left greater than right. Again noted is a masslike area at the right lung apex, currently measuring approximately 2.4 cm. This mass is near fluid density measuring approximately 4 Hounsfield units. There is persistent consolidation involving the patient's left lower lobe with near complete collapse of the left lower lobe. There is persistent consolidation in the right lung base. Additional peripheral airspace opacities are noted bilaterally, not substantially changed from prior study. The trachea is unremarkable. Upper Abdomen: There appears to be a mass at the pancreatic tail measuring approximately 5.6 x 4.9 cm. This is not well evaluated as it is  only partially visualized, additionally lack of IV contrast limits evaluation. There is fat stranding about the partially visualized pancreas. There is extensive edema along the patient's left flank that is only partially visualized. There appears to be a new fluid collection near the GE junction. Musculoskeletal: There is an apparent fracture through the T1 transverse process. There is lucency involving the posterior elements of the C7 and T1 vertebral bodies on the right (axial series 5, image 13). There is a heterogeneous appearance of the T7 and T8 vertebral bodies. There are bilateral glenohumeral joint effusions. IMPRESSION: 1. Evaluation is limited by lack of IV contrast. 2. There is persistent bibasilar airspace disease, with a growing left-sided pleural effusion. There is no pneumothorax. 3. Again noted is a possible right apical lung mass with a mottled appearance of the nearby C7 and T1 vertebral bodies with a possible pathologic fracture through the T1 transverse process on the right. Additionally, there is an abnormal appearance of the T7 and T8 vertebral bodies. Findings raise concern for underlying metastatic disease. A follow-up contrast enhanced cervical and thoracic spine MRI is recommended. 4. Very abnormal appearance of the partially visualized pancreas with a questionable pancreatic tail mass. There is new fluid abutting  the proximal stomach. Follow-up with a contrast enhanced CT of the abdomen is recommended for further evaluation of this finding. 5. Nonspecific left flank edema. Correlation with physical exam and contrast-enhanced CT is recommended. 6. Anemia. 7. There is a 2.7 cm left-sided thyroid nodule. Recommend thyroid US (ref: J Am Coll Radiol. 2015 Feb;12(2): 143-50). 8. There are nonspecific bilateral glenohumeral joint effusions. 9.  Aortic Atherosclerosis (ICD10-I70.0). Aortic Atherosclerosis (ICD10-I70.0). Electronically Signed   By: Constance Holster M.D.   On: 04/14/2020 20:04    CT CHEST WO CONTRAST  Result Date: 03/30/2020 CLINICAL DATA:  Pneumonia, abnormal chest radiograph with questionable cavitation in the LEFT lower lobe raising question of lung abscess EXAM: CT CHEST WITHOUT CONTRAST TECHNIQUE: Multidetector CT imaging of the chest was performed following the standard protocol without IV contrast. Sagittal and coronal MPR images reconstructed from axial data set. COMPARISON:  Chest radiograph 03/30/2020 FINDINGS: Cardiovascular: Atherosclerotic calcifications aorta, proximal great vessels and coronary arteries. 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 Heart upper normal size. No pericardial effusion. Mediastinum/Nodes: Esophagus unremarkable. LEFT thyroid nodule 2.2 x 1.9 cm; recommend thyroid US. (Ref: J Am Coll Radiol. 2015 Feb;12(2): 143-50). Base of cervical region otherwise normal appearance. No thoracic adenopathy. Lungs/Pleura: RIGHT apex mass 2.7 x 2.3 x 2.0 cm concerning for neoplasm, potentially with chest wall invasion. BILATERAL lower lobe infiltrates consistent with atelectasis and pneumonia. No pleural effusion or pneumothorax. Upper Abdomen: Adrenal thickening without mass. Ill-defined peripancreatic tissue planes raising question of pancreatitis at tail, incompletely visualized and assessed. Infiltrative changes appear to extend between the stomach and spleen. Musculoskeletal: Scattered mild degenerative disc disease changes of the thoracic spine. 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 BILATERAL lower lobe infiltrates consistent with atelectasis and pneumonia. No evidence of cavitary pneumonia/lung abscess. Ill-defined peripancreatic tissue planes raising question of pancreatitis at tail, incompletely visualized and assessed; recommend correlation with serum lipase and dedicated CT abdomen. LEFT thyroid nodule 2.2 x 1.9 cm;  recommend thyroid ultrasound assessment as above. Aortic Atherosclerosis (ICD10-I70.0). Electronically Signed   By: Lavonia Dana M.D.   On: 03/30/2020 11:20   CT HUMERUS LEFT W CONTRAST  Result Date: 04/08/2020 CLINICAL DATA:  Evaluate olecranon osteomyelitis and abscesses. EXAM: CT OF THE UPPER LEFT EXTREMITY WITH CONTRAST TECHNIQUE: Multidetector CT imaging of the upper left extremity was performed according to the standard protocol following intravenous contrast administration. CONTRAST:  53mL OMNIPAQUE IOHEXOL 300 MG/ML  SOLN COMPARISON:  Limited MRI examination from 03/22/2020 FINDINGS: There are destructive bony changes involving the olecranon consistent with osteomyelitis. Adjacent inflammatory phlegmon and abscess noted in the region of the olecranon bursa likely septic bursitis. As seen on the MRI there is also a small fluid collection just anterior to the lower biceps muscle. This measured approximately 17 x 10 mm on the MRI and measures approximately 13 x 9 mm on the CT scan. There is also evidence of pyomyositis most likely in the flexor digitorum superficialis muscle of the proximal forearm. The larger abscess measures 15 x 14 mm and the smaller adjacent abscess measures 8 mm. Surrounding myositis and cellulitis is noted. I do not see any findings suspicious for septic arthritis at the elbow joint. IMPRESSION: 1. Osteomyelitis involving the olecranon with adjacent inflammatory phlegmon and abscess in the region of the olecranon bursa. 2. Evidence of pyomyositis most likely in the flexor digitorum superficialis muscle of the proximal forearm. 3. Small abscess along  the ventral aspect of the lower biceps muscle. 4. No CT findings suspicious for septic arthritis at the elbow joint. Electronically Signed   By: Marijo Sanes M.D.   On: 03/30/2020 13:32   MR BRAIN WO CONTRAST  Result Date: 03/22/2020 CLINICAL DATA:  Mental status change, multiple falls, right arm numbness EXAM: MRI HEAD WITHOUT CONTRAST  TECHNIQUE: Multiplanar, multiecho pulse sequences of the brain and surrounding structures were obtained without intravenous contrast. COMPARISON:  None. FINDINGS: Brain: There is no acute infarction or intracranial hemorrhage. There is no intracranial mass, mass effect, or edema. There is no hydrocephalus or extra-axial fluid collection. Multiple small foci of susceptibility hypointensity with involvement of the basal ganglia, thalamus, and pons consistent with chronic hypertensive microhemorrhages. Few scattered small foci of T2 hyperintensity in the supratentorial white matter are nonspecific but may reflect minor chronic microvascular ischemic changes. Ventricles and sulci are within normal limits in size and configuration. Vascular: Major vessel flow voids at the skull base are preserved. Skull and upper cervical spine: Marrow signal within normal limits. Sinuses/Orbits: Paranasal sinuses are aerated. Orbits are unremarkable. Other: Right frontal scalp hematoma. Sella is unremarkable. Mastoid air cells are clear. IMPRESSION: No evidence of recent infarction, hemorrhage, or mass. Chronic hypertensive microhemorrhages. Minor chronic microvascular ischemic changes. Electronically Signed   By: Macy Mis M.D.   On: 03/22/2020 16:36   MR CERVICAL SPINE WO CONTRAST  Result Date: 04/17/2020 CLINICAL DATA:  Apical lung mass with bone involvement. Assess for spinal metastatic disease. History of renal disease. EXAM: MRI CERVICAL SPINE WITHOUT CONTRAST TECHNIQUE: Multiplanar, multisequence MR imaging of the cervical spine was performed. No intravenous contrast was administered. COMPARISON:  Chest CT 04/14/2020 FINDINGS: Alignment: 1 or 2 mm of degenerative anterolisthesis at C7-T1. Otherwise normal. Vertebrae: I do not see evidence clear metastatic disease to bone. There is some endplate edema at the L5-7 level to a mild degree and a more pronounced degree at the C7-T1 level. The differential diagnosis is  degenerative disease versus is early discitis osteomyelitis versus arthropathy associated with chronic renal disease. See below. Cord: No cord compression or primary cord lesion. Posterior Fossa, vertebral arteries, paraspinal tissues: Limited visualization of the posterior fossa is unremarkable. There is a right paravertebral T2 bright abnormality most consistent with a fluid collection, extending from C6 to T2 along the anterior right margin of the vertebral bodies. There appears to be involvement of the costovertebral articulation on the right at the first rib. Question if this is in communication with fluid associated with right-sided facet arthropathy at C7-T1. See below. Disc levels: Foramen magnum is widely patent.  C1-2 and C2-3 are unremarkable. C3-4: Endplate osteophytes and bulging of the disc. More prominent on the left. Mild narrowing of the left side of the canal and intervertebral foramen on the left. Some potential this could affect the left C4 nerve. As noted above, there is mild endplate edema, which I favor is degenerative at this level. C4-5: Chronic disc degeneration with endplate osteophytes and bulging of the disc. No compressive canal stenosis. Moderate foraminal narrowing on the left that could affect the C5 nerve. C5-6: Endplate osteophytes and bulging of the disc. No compressive canal or foraminal narrowing. C6-7: Endplate osteophytes and bulging of the disc. No compressive canal or foraminal narrowing. C7-T1: Facet arthropathy on the right with edematous change. Disc pathology with disc space narrowing and fluid intensity material in the disc space. Mild endplate edema. As noted above, there is a right paravertebral fluid collection extending from C6-T2.  There is also pathology affecting the right first costovertebral articulation. Whereas all these findings could conceivably relate to degenerative disease or spondylopathy of renal failure, infectious inflammation is a real concern. This  does not look to me like neoplastic disease. I would also say that the pleural density at the apex on the right is also T2 bright and could be a similar fluid collection. IMPRESSION: I do not see findings that suggest that we are dealing with metastatic disease to the spine. What was described as a mass at the right lung apex looks more like a fluid collection by MRI. Additionally, there is a right paravertebral fluid collection extending from C6-T2. There is fluid associated with the right C7-T1 facet joint, the right first costovertebral articulation, and within the C7-T1 disc space. This constellation of findings is most concerning for spinal infection. Spondylopathy of renal failure could possibly relate to these findings, but infection is my primary concern. Electronically Signed   By: Nelson Chimes M.D.   On: 04/17/2020 22:03   MR THORACIC SPINE WO CONTRAST  Result Date: 04/18/2020 CLINICAL DATA:  Cancer of unknown primary. Staging apical lung mass. Bacteremia EXAM: MRI THORACIC SPINE WITHOUT CONTRAST TECHNIQUE: Multiplanar, multisequence MR imaging of the thoracic spine was performed. No intravenous contrast was administered. COMPARISON:  Chest CT from 3 days ago. Cervical MRI from the same day. FINDINGS: The patient terminated the study early. Only sagittal T2 and axial T2 weighted imaging was acquired. Apparent fluid collection at the right first costovertebral junction was better seen on prior cervical MRI, with attendant regional marrow signal abnormality and presumed fluid collection at the right superior sulcus measuring 17 mm. At T7-8 there is disc space T2 hyperintensity without ankylosis but with subtle endplate erosions by CT. The endplates appear indistinct by T1 weighted imaging on the sagittal counter sequence as well. No visible adjacent paravertebral fat inflammation. T9-10 central disc protrusion without neural impingement. Small left more than right pleural effusion IMPRESSION: 1. Truncated  study due to patient request. 2. Presumed fluid collection at the right first costovertebral junction with presumed separate fluid collection at the superior sulcus. As noted on prior cervical MRI, spinal infection is the primary concern. 3. 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 Electronically Signed   By: Monte Fantasia M.D.   On: 04/18/2020 09:29   CT FOREARM LEFT W CONTRAST  Result Date: 03/28/2020 CLINICAL DATA:  Evaluate olecranon osteomyelitis and abscesses. EXAM: CT OF THE UPPER LEFT EXTREMITY WITH CONTRAST TECHNIQUE: Multidetector CT imaging of the upper left extremity was performed according to the standard protocol following intravenous contrast administration. CONTRAST:  73mL OMNIPAQUE IOHEXOL 300 MG/ML  SOLN COMPARISON:  Limited MRI examination from 03/22/2020 FINDINGS: There are destructive bony changes involving the olecranon consistent with osteomyelitis. Adjacent inflammatory phlegmon and abscess noted in the region of the olecranon bursa likely septic bursitis. As seen on the MRI there is also a small fluid collection just anterior to the lower biceps muscle. This measured approximately 17 x 10 mm on the MRI and measures approximately 13 x 9 mm on the CT scan. There is also evidence of pyomyositis most likely in the flexor digitorum superficialis muscle of the proximal forearm. The larger abscess measures 15 x 14 mm and the smaller adjacent abscess measures 8 mm. Surrounding myositis and cellulitis is noted. I do not see any findings suspicious for septic arthritis at the elbow joint. IMPRESSION: 1. 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 2. Evidence of pyomyositis most likely in the flexor digitorum superficialis muscle of the proximal forearm. 3. Small abscess along the ventral aspect of the lower biceps muscle. 4. No CT findings suspicious for septic arthritis at the elbow joint. Electronically  Signed   By: Marijo Sanes M.D.   On: 03/31/2020 13:32   US RENAL  Result Date: 03/30/2020 CLINICAL DATA:  Acute renal insufficiency. EXAM: RENAL / URINARY TRACT ULTRASOUND COMPLETE COMPARISON:  None. FINDINGS: Right Kidney: Renal measurements: 10.6 x 4.4 x 4.8 cm = volume: 117.1 mL. No significant hydronephrosis. Left Kidney: Renal measurements: 10.7 x 5.2 x 4.7 cm = volume: 137 mL. Contains a 1.4 cm cyst. Bladder: Appears normal for degree of bladder distention. Other: Cholelithiasis. IMPRESSION: 1. No cause for acute renal insufficiency. 2. Cholelithiasis. Electronically Signed   By: Dorise Bullion III M.D   On: 03/19/2020 19:00   DG Hand 2 View Right  Result Date: 04/16/2020 CLINICAL DATA:  Right hand pain after fall last month. EXAM: RIGHT HAND - 2 VIEW COMPARISON:  None. FINDINGS: There is no evidence of fracture or dislocation. Moderate degenerative changes seen involving the first carpometacarpal joint. Soft tissues are unremarkable. IMPRESSION: Moderate degenerative joint disease of the first carpometacarpal joint. No acute abnormality seen in the right hand. Electronically Signed   By: Marijo Conception M.D.   On: 04/16/2020 17:04   MR ELBOW LEFT WO CONTRAST  Result Date: 03/22/2020 CLINICAL DATA:  Left elbow pain.  MSSA bacteremia. EXAM: MRI OF THE LEFT ELBOW WITHOUT CONTRAST TECHNIQUE: Multiplanar, multisequence MR imaging of the elbow was performed. No intravenous contrast was administered. COMPARISON:  Left elbow x-rays dated March 19, 2020. FINDINGS: Incomplete study. The patient refused further imaging. Sagittal sequence is nondiagnostic. No coronal sequences were obtained. TENDONS Common forearm flexor origin: Intact. Common forearm extensor origin: Intact. Biceps: Intact. Triceps: High-grade partial tear at the insertion, incompletely evaluated. LIGAMENTS Medial stabilizers: Limited evaluation.  Grossly intact. Lateral stabilizers:  Limited evaluation.  Grossly intact. Cartilage: Not well  evaluated by the axial sequences. Joint: Moderate joint effusion. Cubital tunnel: Increased T2 signal of the ulnar nerve within the cubital tunnel, likely reactive. Bones: Patchy marrow edema and heterogeneously decreased T1 marrow signal involving the olecranon and proximal ulna. Other: Proximal forearm flexor compartment intramuscular fluid collection measuring 1.6 x 4.3 cm (series 9, images 1-10), with surrounding muscle edema. Additional incompletely visualized ill-defined fluid collection in the ulnar aspect of the distal upper arm medial to the brachialis muscle measuring 3.1 x 2.0 cm (series 9, image 30). Incompletely visualized 1.0 x 1.8 cm fluid collection in the distal upper arm anterior to the biceps myotendinous junction (series 9, image 30). IMPRESSION: 1. Limited, incomplete study. The patient refused further imaging. 2. 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 3. Moderate joint effusion concerning for septic arthritis. 4. Proximal forearm flexor compartment intramuscular fluid collection measuring 1.6 x 4.3 cm with surrounding muscle edema, concerning for abscess. 5. Additional incompletely visualized small fluid collections in the distal upper arm medial to the brachialis muscle and anterior to the biceps myotendinous junction, also concerning for abscesses. 6. High-grade partial tear of the distal triceps tendon, incompletely evaluated. Electronically Signed   By: Titus Dubin M.D.   On: 03/22/2020 17:40   DG CHEST PORT 1 VIEW  Result Date: 04/20/2020 CLINICAL DATA:  Sepsis. EXAM: PORTABLE CHEST 1 VIEW COMPARISON:  April 14, 2020. FINDINGS: Stable cardiomegaly. No pneumothorax is noted. Bibasilar atelectasis or infiltrates are noted. Probable  small left pleural effusion is noted. Bony thorax is unremarkable. IMPRESSION: Bibasilar atelectasis or infiltrates are noted. Probable small left pleural effusion. Aortic Atherosclerosis (ICD10-I70.0). Electronically Signed   By: Marijo Conception M.D.   On: 04/20/2020 09:18   DG CHEST PORT 1 VIEW  Result Date: 04/14/2020 CLINICAL DATA:  Pneumonia and hyperglycemia.  Post EGD 04/03/2020 EXAM: PORTABLE CHEST 1 VIEW COMPARISON:  120 match that 03/30/2020 FINDINGS: Heart size upper normal. Vascularity normal. Progressive left lower lobe consolidation with small left effusion. Mild right lower lobe airspace disease slightly improved. IMPRESSION: Progressive left lower lobe consolidation and left effusion. Mild right lower lobe airspace disease slightly improved. Electronically Signed   By: Franchot Gallo M.D.   On: 04/14/2020 10:40   DG CHEST PORT 1 VIEW  Result Date: 03/30/2020 CLINICAL DATA:  Respiratory failure and hemoptysis. EXAM: PORTABLE CHEST 1 VIEW COMPARISON:  03/28/2020 FINDINGS: The heart size is stable. Worsening bilateral lower lobe pneumonia, left greater than right. There is suggestion potential component of cavitation in the left lower lobe airspace process but this is not very well-defined on the portable chest x-ray. No edema, pneumothorax or pleural fluid identified. IMPRESSION: Worsening bilateral lower lobe pneumonia, left greater than right. There is suggestion of potential component of cavitation in the left lower lobe airspace process. Electronically Signed   By: Aletta Edouard M.D.   On: 03/30/2020 09:20   DG CHEST PORT 1 VIEW  Result Date: 04/03/2020 CLINICAL DATA:  Leukocytosis EXAM: PORTABLE CHEST 1 VIEW COMPARISON:  February 15, 2019 FINDINGS: The cardiomediastinal silhouette is unchanged in contour.Atherosclerotic calcifications of the aorta. Low lung volumes with bronchovascular crowding. No pleural effusion. No pneumothorax. No acute pleuroparenchymal abnormality. Visualized abdomen is unremarkable. Mild degenerative changes of the thoracic spine. IMPRESSION: No acute cardiopulmonary abnormality. Electronically Signed   By: Valentino Saxon MD   On: 04/13/2020 17:09   DG Abd Portable 1V  Result Date:  04/20/2020 CLINICAL DATA:  Abdominal discomfort. EXAM: PORTABLE ABDOMEN - 1 VIEW COMPARISON:  April 18, 2020. FINDINGS: The bowel gas pattern is normal. No radio-opaque calculi or other significant radiographic abnormality are seen. IMPRESSION: Negative. Electronically Signed   By: Marijo Conception M.D.   On: 04/20/2020 09:19   DG Abd Portable 1V  Result Date: 04/18/2020 CLINICAL DATA:  Nausea. EXAM: PORTABLE ABDOMEN - 1 VIEW COMPARISON:  None. FINDINGS: The bowel gas pattern is normal. No radio-opaque calculi or other significant radiographic abnormality are seen. IMPRESSION: Negative. Electronically Signed   By: Marijo Conception M.D.   On: 04/18/2020 10:17   ECHOCARDIOGRAM COMPLETE  Result Date: 03/23/2020    ECHOCARDIOGRAM REPORT   Patient Name:   Deborah Figueroa Date of Exam: 03/23/2020 Medical Rec #:  [id number]         Height:       71.0 in Accession #:    [id number]        Weight:       207.0 lb Date of Birth:  01-21-1956         BSA:          2.140 m Patient Age:    24 years          BP:           139/78 mmHg Patient Gender: F                 HR:           123 bpm. Exam Location:  Inpatient  Procedure: 2D Echo, Color Doppler and Cardiac Doppler Indications:    Bacteremia R78.81  History:        Patient has no prior history of Echocardiogram examinations.                 CHF; Risk Factors:Hypertension, Dyslipidemia and Diabetes.  Sonographer:    Bernadene Person RDCS Referring Phys: [id number] [place]  1. Left ventricular ejection fraction, by estimation, is 65 to 70%. The left ventricle has hyperdynamic function. The left ventricle has no regional wall motion abnormalities. There is mild left ventricular hypertrophy. Indeterminate diastolic filling due to E-A fusion. There is a LV mid-cavity gradient, peak 57 mmHg.  2. Right ventricular systolic function is normal. The right ventricular size is normal. Tricuspid regurgitation signal is inadequate for assessing PA pressure.  3. The aortic  valve is tricuspid. Aortic valve regurgitation is not visualized. Mild aortic valve sclerosis is present, with no evidence of aortic valve stenosis.  4. The mitral valve is normal in structure. No evidence of mitral valve regurgitation. No evidence of mitral stenosis.  5. The inferior vena cava is normal in size with greater than 50% respiratory variability, suggesting right atrial pressure of 3 mmHg. FINDINGS  Left Ventricle: Left ventricular ejection fraction, by estimation, is 65 to 70%. The left ventricle has hyperdynamic function. The left ventricle has no regional wall motion abnormalities. The left ventricular internal cavity size was normal in size. There is mild left ventricular hypertrophy. Indeterminate diastolic filling due to E-A fusion. Right Ventricle: The right ventricular size is normal. No increase in right ventricular wall thickness. Right ventricular systolic function is normal. Tricuspid regurgitation signal is inadequate for assessing PA pressure. Left Atrium: Left atrial size was normal in size. Right Atrium: Right atrial size was normal in size. Pericardium: There is no evidence of pericardial effusion. Mitral Valve: The mitral valve is normal in structure. No evidence of mitral valve regurgitation. No evidence of mitral valve stenosis. Tricuspid Valve: The tricuspid valve is normal in structure. Tricuspid valve regurgitation is trivial. Aortic Valve: The aortic valve is tricuspid. Aortic valve regurgitation is not visualized. Mild aortic valve sclerosis is present, with no evidence of aortic valve stenosis. Pulmonic Valve: The pulmonic valve was normal in structure. Pulmonic valve regurgitation is not visualized. Aorta: The aortic root is normal in size and structure. Venous: The inferior vena cava is normal in size with greater than 50% respiratory variability, suggesting right atrial pressure of 3 mmHg. IAS/Shunts: No atrial level shunt detected by color flow Doppler.  LEFT VENTRICLE PLAX  2D LVIDd:         3.20 cm  Diastology LVIDs:         1.80 cm  LV e' medial:    13.20 cm/s LV PW:         1.30 cm  LV E/e' medial:  9.4 LV IVS:        1.30 cm  LV e' lateral:   14.30 cm/s LVOT diam:     1.90 cm  LV E/e' lateral: 8.7 LV SV:         75 LV SV Index:   35 LVOT Area:     2.84 cm  RIGHT VENTRICLE RV S prime:     19.20 cm/s TAPSE (M-mode): 1.9 cm LEFT ATRIUM             Index       RIGHT ATRIUM           Index  LA diam:        2.80 cm 1.31 cm/m  RA Area:     13.20 cm LA Vol (A2C):   44.9 ml 20.99 ml/m RA Volume:   30.50 ml  14.25 ml/m LA Vol (A4C):   45.5 ml 21.27 ml/m LA Biplane Vol: 45.1 ml 21.08 ml/m  AORTIC VALVE LVOT Vmax:   178.00 cm/s LVOT Vmean:  138.000 cm/s LVOT VTI:    0.266 m  AORTA Ao Root diam: 3.30 cm Ao Asc diam:  3.60 cm MITRAL VALVE MV Area (PHT): 8.62 cm     SHUNTS MV Decel Time: 88 msec      Systemic VTI:  0.27 m MV E velocity: 124.00 cm/s  Systemic Diam: 1.90 cm MV A velocity: 81.40 cm/s MV E/A ratio:  1.52 Loralie Champagne MD Electronically signed by Loralie Champagne MD Signature Date/Time: 04/07/2020/5:00:32 PM    Final    ECHO TEE  Result Date: 04/02/2020    TRANSESOPHOGEAL ECHO REPORT   Patient Name:   Deborah Figueroa Date of Exam: 04/13/2020 Medical Rec #:  [id number]         Height:       71.0 in Accession #:    [id number]        Weight:       207.0 lb Date of Birth:  [birth year]         BSA:          2.140 m Patient Age:    45 years          BP:           120/69 mmHg Patient Gender: F                 HR:           117 bpm. Exam Location:  Inpatient Procedure: Transesophageal Echo and Color Doppler Indications:     bacteremia  History:         Patient has prior history of Echocardiogram examinations, most                  recent 04/02/2020. Sepsis.  Sonographer:     Johny Chess Referring Phys:  [place] Diagnosing Phys: Buford Dresser MD PROCEDURE: After discussion of the risks and benefits of a TEE, an informed consent was obtained from the patient. The  transesophogeal probe was passed without difficulty through the esophogus of the patient. Sedation performed by different physician. The patient was monitored while under deep sedation. Anesthestetic sedation was provided intravenously by Anesthesiology: 260.5mg  of Propofol. The patient's vital signs; including heart rate, blood pressure, and oxygen saturation; remained stable throughout the procedure. The patient developed no complications during the procedure. IMPRESSIONS  1. Left ventricular ejection fraction, by estimation, is 65 to 70%. The left ventricle has hyperdynamic function.  2. Right ventricular systolic function is normal. The right ventricular size is normal.  3. No left atrial/left atrial appendage thrombus was detected.  4. The mitral valve is normal in structure. Trivial mitral valve regurgitation. No evidence of mitral stenosis.  5. The aortic valve is tricuspid. Aortic valve regurgitation is not visualized. Mild aortic valve sclerosis is present, with no evidence of aortic valve stenosis.  6. Aortic focal calcification in aortic root. There is Moderate (Grade III) plaque involving the descending aorta. Conclusion(s)/Recommendation(s): No evidence of vegetation/infective endocarditis on this transesophageal echocardiogram. FINDINGS  Left Ventricle: Left ventricular ejection fraction, by estimation, is 65 to 70%. The left ventricle has  hyperdynamic function. The left ventricular internal cavity size was normal in size. Right Ventricle: The right ventricular size is normal. No increase in right ventricular wall thickness. Right ventricular systolic function is normal. Left Atrium: Left atrial size was not assessed. No left atrial/left atrial appendage thrombus was detected. Right Atrium: Right atrial size was not assessed. Pericardium: Trivial pericardial effusion is present. Mitral Valve: The mitral valve is normal in structure. There is mild thickening of the mitral valve leaflet(s). Trivial  mitral valve regurgitation. No evidence of mitral valve stenosis. There is no evidence of mitral valve vegetation. Tricuspid Valve: The tricuspid valve is normal in structure. Tricuspid valve regurgitation is trivial. No evidence of tricuspid stenosis. There is no evidence of tricuspid valve vegetation. Aortic Valve: The aortic valve is tricuspid. Aortic valve regurgitation is not visualized. Mild aortic valve sclerosis is present, with no evidence of aortic valve stenosis. There is no evidence of aortic valve vegetation. Pulmonic Valve: The pulmonic valve was grossly normal. Pulmonic valve regurgitation is trivial. There is no evidence of pulmonic valve vegetation. Aorta: Focal calcification in aortic root. There is moderate (Grade III) plaque involving the descending aorta. IAS/Shunts: No atrial level shunt detected by color flow Doppler. There is no evidence of a patent foramen ovale. There is no evidence of an atrial septal defect. Buford Dresser MD Electronically signed by Buford Dresser MD Signature Date/Time: 04/02/2020/9:34:04 AM    Final    US THYROID  Result Date: 04/19/2020 CLINICAL DATA:  Nodule EXAM: THYROID ULTRASOUND TECHNIQUE: Ultrasound examination of the thyroid gland and adjacent soft tissues was performed. COMPARISON:  None. FINDINGS: Parenchymal Echotexture: Normal Isthmus: 0.6 cm Right lobe: 4.7 x 1.5 x 1.9 cm Left lobe: 6 x 1.7 x 2.1 cm _________________________________________________________ Estimated total number of nodules >/= 1 cm: 1 Number of spongiform nodules >/=  2 cm not described below (TR1): 0 Number of mixed cystic and solid nodules >/= 1.5 cm not described below (TR2): 0 _________________________________________________________ Nodule # 1: Location: Left; Inferior Maximum size: 3 cm; Other 2 dimensions: 2.4 x 2.2 cm Composition: solid/almost completely solid (2) Echogenicity: hypoechoic (2) Shape: not taller-than-wide (0) Margins: ill-defined (0) Echogenic foci:  none (0) ACR TI-RADS total points: 4. ACR TI-RADS risk category: TR4 (4-6 points). ACR TI-RADS recommendations: **Given size (>/= 1.5 cm) and appearance, fine needle aspiration of this moderately suspicious nodule should be considered based on TI-RADS criteria. _________________________________________________________ IMPRESSION: There is a 3 cm TR 4 thyroid nodule in the left inferior thyroid gland. Fine-needle aspiration is recommended for this thyroid nodule. The above is in keeping with the ACR TI-RADS recommendations - J Am Coll Radiol 2017;14:587-595. Electronically Signed   By: Constance Holster M.D.   On: 04/19/2020 18:47   VAS [place] UPPER EXTREMITY VENOUS DUPLEX  Result Date: 04/20/2020 UPPER VENOUS STUDY  Indications: Ongoing edema RUE Comparison Study: Previous RUE study on 04/01/20 was positive for Acute SVT of                   cephalic vein at [facility] fossa. Performing Technologist: Rogelia Rohrer  Examination Guidelines: A complete evaluation includes B-mode imaging, spectral Doppler, color Doppler, and power Doppler as needed of all accessible portions of each vessel. Bilateral testing is considered an integral part of a complete examination. Limited examinations for reoccurring indications may be performed as noted.  Right Findings: +----------+------------+---------+-----------+----------+--------------+ RIGHT     CompressiblePhasicitySpontaneousProperties   Summary     +----------+------------+---------+-----------+----------+--------------+ IJV  Full       Yes       Yes                             +----------+------------+---------+-----------+----------+--------------+ Subclavian    Full       Yes       Yes                             +----------+------------+---------+-----------+----------+--------------+ Axillary      Full       Yes       Yes                             +----------+------------+---------+-----------+----------+--------------+ Brachial      Full        Yes       Yes                             +----------+------------+---------+-----------+----------+--------------+ Radial        Full                                  Not visualized +----------+------------+---------+-----------+----------+--------------+ Ulnar         Full                                  Not visualized +----------+------------+---------+-----------+----------+--------------+ Cephalic      None       No        No                  Chronic     +----------+------------+---------+-----------+----------+--------------+ Basilic       Full       Yes       Yes                             +----------+------------+---------+-----------+----------+--------------+ 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 Findings for superficial vein thrombosis appear unchanged from previous study.  Left: No evidence of thrombosis in the subclavian.  *See table(s) above for measurements and observations.    Preliminary    VAS [place] UPPER EXTREMITY VENOUS DUPLEX  Result Date: 04/01/2020 UPPER VENOUS STUDY  Indications: Edema Risk Factors: None identified. Comparison Study: No prior studies. Performing Technologist: Oliver Hum RVT  Examination Guidelines: A complete evaluation includes B-mode imaging, spectral Doppler, color Doppler, and power Doppler as needed of all accessible portions of each vessel. Bilateral testing is considered an  integral part of a complete examination. Limited examinations for reoccurring indications may be performed as noted.  Right Findings: +----------+------------+---------+-----------+----------+-------+ RIGHT     CompressiblePhasicitySpontaneousPropertiesSummary +----------+------------+---------+-----------+----------+-------+ IJV  Full       Yes       Yes                      +----------+------------+---------+-----------+----------+-------+ Subclavian    Full       Yes       Yes                      +----------+------------+---------+-----------+----------+-------+ Axillary      Full       Yes       Yes                      +----------+------------+---------+-----------+----------+-------+ Brachial      Full       Yes       Yes                      +----------+------------+---------+-----------+----------+-------+ Radial        Full                                          +----------+------------+---------+-----------+----------+-------+ Ulnar         Full                                          +----------+------------+---------+-----------+----------+-------+ Cephalic      None                                   Acute  +----------+------------+---------+-----------+----------+-------+ Basilic       Full                                          +----------+------------+---------+-----------+----------+-------+  Left Findings: +----------+------------+---------+-----------+----------+-------+ LEFT      CompressiblePhasicitySpontaneousPropertiesSummary +----------+------------+---------+-----------+----------+-------+ Subclavian    Full       Yes       Yes                      +----------+------------+---------+-----------+----------+-------+  Summary:  Right: No evidence of deep vein thrombosis in the upper extremity. Findings consistent with acute superficial vein thrombosis involving the right cephalic vein.  Left: No evidence  of thrombosis in the subclavian.  *See table(s) above for measurements and observations.  Diagnosing physician: Monica Martinez MD Electronically signed by Monica Martinez MD on 04/01/2020 at 5:57:42 PM.    Final    [place] EKG SITE RITE  Result Date: 03/27/2020 If Site Rite image not attached, placement could not be confirmed due to current cardiac rhythm.  US Abdomen Limited RUQ (LIVER/GB)  Result Date: 04/05/2020 CLINICAL DATA:  DKA, sepsis, respiratory failure, anemia and coagulopathy. EXAM: ULTRASOUND ABDOMEN LIMITED RIGHT UPPER QUADRANT COMPARISON:  None. FINDINGS: Gallbladder: The gallbladder is contracted around at least a single shadowing calculus. Common bile duct: Diameter: 3 mm Liver: No focal lesion identified. Within normal limits in parenchymal echogenicity. Portal vein is patent on color Doppler imaging with normal direction of blood flow towards the liver. Other: No ascites visualized in the right upper quadrant. IMPRESSION: 1. Cholelithiasis with contracted gallbladder around at  least a single gallstone. 2. Normal sonographic appearance of the liver. Electronically Signed   By: Aletta Edouard M.D.   On: 04/05/2020 09:08     ASSESSMENT AND PLAN:  1.  Coagulopathy 2.  Normocytic anemia 3.  Leukocytosis 4.  MSSA bacteremia 5.  AKI 6.  Hypercalcemia 7.  Lung mass 8. ? Rectovaginal fistula 9.  Diabetes mellitus  -Patient has significant worsening coagulopathy.  Has not received recent anticoagulation.  Etiology is unclear at this time.  DIC panel has been requested.  I have entered this order twice and so far has been discontinued.  I have entered again a third time to be drawn stat.  May need to consider giving vitamin K and possible FFP or cryo once labs resulted.  Further recommendations per Dr. Marin Olp when seen later today. -The patient has anemia due to possible GI blood loss and bone marrow suppression from acute infection.  GI work-up already completed.  Recommend PRBC  transfusion for hemoglobin less than 7. -Treatment of acute infection per ID. -Nephrology consult pending for AKI. -Additional work-up for lung mass is planned as an outpatient.  Biopsy was previously requested by IR but PET scan needed prior to consideration of biopsy.   Thank you for this referral.  Mikey Bussing, DNP, AGPCNP-BC, AOCNP  ADDENDUM: I saw and examined Ms. [employer].  She is very nice.  I must say that I am never seen coagulation parameters like this unless there is heparin or some factor interfering.  I have to believe that she probably is markedly vitamin K deficient.  She is not bleeding as far as I can tell.  She been on antibiotics.  Is been in the hospital for about a month.  She is diabetic.  She has multiple, multiple problems.  I still cannot figure out if she has any underlying malignancy.  There is a questionable mass in the pancreas tail.  This measures 5.6 x 4.9 cm.  No IV contrast can be given on the CT scan to better identify this.   She had MRI of the thoracic and cervical spine.  Thankfully, there is no evidence of metastatic disease to the spine.  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  She does have renal dysfunction.  She has some possible hypercalcemia.  I did find it interesting that her coagulation parameters were relatively normal just 2 weeks ago.  I have never seen any type of "inhibitor" all of a sudden develop.  Again, I had believe this is going to be iatrogenic.  I did give her big doses of vitamin K.  I would hold off on blood products right now.  Again she is not having any obvious bleeding from what I can tell.  I know that she is hypotensive.  She has been seen by a lot of doctors.  Lattie Haw, MD  Psalms 34:19  She clearly needs daily labs.  Of note, back in January, she had a mildly depressed factor X level.  I know that there are malignancies that actually cause blood clotting because of activated factor X.  She  recently had a Doppler done of her right arm.  She has a superficial thrombus.  There is no deep vein thrombus.  This is incredibly complex.  Again she has quite a few problems.  We will follow along and try to help out.

## 2020-04-20 NOTE — Consult Note (Addendum)
NAME:  Deborah Figueroa, MRN:  [id number], DOB:  [birth year], LOS: 2 ADMISSION DATE:  04/11/2020, CONSULTATION DATE: 2/7 REFERRING MD:  Dr. Sarajane Jews, CHIEF COMPLAINT:  Hypotension   Brief History:  65 y/o F admitted 1/8 with   History of Present Illness:  65 y/o, homeless F, who presented to [facility] on 1/8 with reports of weakness, falls, increased urination and thirst.  She was found to have DKA on presentation and was started on insulin / IVF and admitted per [facility].  She had a leukocytosis on presentation and left elbow pain which led to identification of sepsis secondary to MSSA bacteremia.  Imaging identified left olecranon bursitis and osteomyelitis.  She underwent I&D on 1/10 with fluid / tissue from the L olecranon grew MSSA. Repeat cultures subsequently cleared.  Plan is to complete 6 weeks of IV antibiotics (likely inpatient due to homelessness / lack of insurance). She developed a normocytic normochromic anemia with a positive FOBT, epistaxis and GI was consulted as well as ONC.  She had an EGD + colonoscopy on 1/27 which showed stercoral ulcer and gastric ulcer.  ONC felt her anemia was in the setting of sepsis.  She was noted to have coagulopathy and treated with vitamin K.  She developed hypoxemic respiratory distress in the setting of PNA.  She has had extensive radiographic evaluation which has raised concern for RUL pulmonary mass, enlarging left pleural effusion, thyroid nodule, & possible pathologic fractures of spine.  She developed worsening AKI, AGMA & hypotension prompting PCCM consultation on 2/7.  Past Medical History:  Homelessness  Seasonal Allergies  Depression  Cellulitis  HTN HLD DM  Significant Hospital Events:  1/08 Admit  1/10 ID of left arm per Ortho  1/27 EGD + Colonoscopy  2/07 PCCM consulted   Consults:  ONC  GI  Ortho  ID [place] PCCM Nephrology  Procedures:    Significant Diagnostic Tests:   CT Head 1/6 >> negative for acute intracranial  abnormality, forehead periorbital hematoma  Renal US 1/8 >> negative   CT L Humerus / Forearm 1/10 >> osteomyelitis of the olecranon with adjacent inflammatory phlegmon and abscess in the region of the olecranon bursa, evidence of pyomyositis in the flexor digitorum superficialis muscle of the proximal forearm, small abscess along the ventral aspect of the lower bisceps  MRI Brain 1/9 >> no evidence of recent infarction   MRI L Elbow 1/9 >> limited study, osteomyelitis of the olecranon and proximal ulna, moderate joint effusion concerning for septic arthritis, proximal forearm flexor compartment intramuscular fluid collection concerning for abscess, additional incompletely visualized small fluid collections in the distal upper arm   ECHO 1/11 >> LVEF 65-70%, LV with hyperdynamic function, mild LVH, RV systolic function normal,   TEE 1/11 >> no evidence of vegetation / infective endocarditis   CT Chest 1/17 >> right apical lung mass 2.7 x 2.3 x2 cm concerning for neoplasm potentially with chest wall invasion, bilateral lower lobe infiltrates c/w atelectasis and pneumonia.  Ill defined peripancreatic tissue planes raising question of pancreatitis at the tail, left thyroid nodule 2.2 x 1.9 cm.    [place] ABD Limited 1/23 >> cholelithiasis with contracted gallbladder around at least a single gallstone, normal sonographic appearance of the liver  CT Chest w/o 2/1 >> limited evaluation with non-contrasted study, persistent bibasilar airspace disease with increasing left effusion, possible right apical lung mass with mottled appearance of C7, T1 vertebral bodies with a possible pathologic fracture through the T1 transverse process on the right.  Abnormal appearance of T7, T8 vertebral bodies.  Very abnormal appearance of the partially visualized pancreas with questionable pancreatic tail mass, new fluid abutting the proximal stomach, non-specific left flank edema, 2.7cm left sided thyroid nodule, non-specific  bilateral glenohumeral joint effusions.  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  Micro Data:  COVID 1/8 >> negative  HIV 1/8 >> negative  BCID 1/8 >> staph detected  MRSA PCR 1/10 >> positive  BCx2 1/8 >> MSSA L Olecranon 1/10 >> MSSA  UC 1/17 >> greater than 100k yeast  BCx2 1/16 >> negative  MRSA PCR 2/7 >> negative   Antimicrobials:  Cefazolin 1/9 >> 1/23  Nafcillin 1/9 >> 1/10  Cefepime 1/18 >>  Diflucan 2/4 >> 2/7 Flagyl 2/7 >>  Interim History / Subjective:  Pt reports she is dry, wanting to drink fluids  BP low but pt mentation normal / interactive  614m UOP in last 24 hours  On RA   Objective   Blood pressure (!) 86/41, pulse 73, temperature (!) 97.5 F (36.4 C), temperature source Oral, resp. rate 15, height '5\' 11"'  (1.803 m), weight 101.5 kg, SpO2 98 %.        Intake/Output Summary (Last 24 hours) at 04/20/2020 1356 Last data filed at 04/20/2020 1226 Gross per 24 hour  Intake 1480.37 ml  Output 1050 ml  Net 430.37 ml   Filed Weights   03/14/2020 0952 03/29/2020 1400 04/20/20 1100  Weight: 93.9 kg 93.9 kg 101.5 kg    Examination: General: chronically ill appearing adult female lying in bed in NAD HEENT: MM pink/dry, missing teeth, anicteric, bruising to right eye Neuro: AAOx4, speech clear, MAE  CV: s1s2 RRR, no m/r/g PULM: non-labored at rest, lungs bilaterally clear anterior, diminished bases with crackles  GI: soft, bsx4 active  Extremities: warm/dry, 1+ generalized edema  Skin: petechiae to left arm >R, left elbow with mild swelling / bruising, no warmth or streaking    Resolved Hospital Problem list      Assessment & Plan:   Severe Sepsis in setting of left Olecranon Osteomyelitis with subsequent MSSA Bacteremia  Hypotension  Rule Out Adrenal Insufficiency Suspect constellation of findings  related to down stream effects of sepsis / MSSA bacteremia in the setting of poorly controlled DM. She is clinically asymptomatic despite hypotension.  At baseline, takes metoprolol for HTN. ? Component of relative adrenal insufficiency (despite prior assessment).  -add stress dose steroids -continue cefepime  -follow cultures  -appreciate ID  -would not start pressors unless rising lactate, change in mental status or decreased UOP  Acute Hypoxemic Respiratory Failure  PNA  Bibasilar infiltrates / consolidation with air bronchograms on imaging.  -pulmonary hygiene -IS, mobilize  -resolved, off O2  AKI  AG / NG Metabolic Acidosis  BUN / sr Cr ratio 20.6 suggestive of pre-renal, dry mucus membranes on exam.  Sodium bicarbonate does not correct to normal, suggestive of component of non-gap acidosis.   -increase sodium bicarbonate to QID, x3 days -Trend BMP / urinary output -Replace electrolytes as indicated -Avoid nephrotoxic agents, ensure adequate renal perfusion  Normocytic Normochromic Anemia  Ulcer identified on EGD -trend CBC  -transfuse for Hgb <7% -PPI BID   Coagulopathy  INR 9.6 2/7, not on anticoagulation  -assess DIC panel now > will have repeat INR, may need further vitamin K / FFP -SCD's  for DVT prophylaxis   Gastric Ulcer  Biopsy negative  -PPI as above  -diet as tolerated  Hypercalcemia  Hypokalemia  -monitor, replace as indicated   Poorly Controlled DM  Hgb A1c 10.4 on admit.  -SSI, moderate scale  -anticipate rise in glucose with steroid administration  -hold home agents   Right Apical Lung Mass DDx includes infectious / inflammatory etiology vs malignancy given other imaging findings -will need to treat as infection and follow up repeat imaging 2-4 weeks post completion -may need biopsy of mass  Questionable C7, T1 Vertebral Body Pathologic Fracture ? Infectious vs malignant etiology  -will need follow up imaging vs biopsy  Thyroid Nodule  2.7  cm left thyroid nodule -will ultimately need biopsy  Hx HTN, HLD -hold home antihypertensives   Questionable Rectovaginal Fistula -will need repeat imaging at some point -abd exam benign   Best practice (evaluated daily)  Diet: clear liquid diet  Pain/Anxiety/Delirium protocol (if indicated): n/a  VAP protocol (if indicated): n/a  DVT prophylaxis: SCD's GI prophylaxis: PPI  Glucose control: SSI  Mobility: as tolerated  Disposition: SDU   Goals of Care:  Last date of multidisciplinary goals of care discussion: pending Family and staff present:  Summary of discussion: pending  Follow up goals of care discussion due: 2/14  Code Status: Full Code. Patient reports her son would make decisions for her if she could not. She has 3 adult children.   Labs   CBC: Recent Labs  Lab 04/14/20 0529 04/15/20 0608 04/20/20 0501  WBC 12.3* 11.6* 13.4*  NEUTROABS 11.0* 10.2*  --   HGB 6.8* 8.3* 8.4*  HCT 21.9* 26.0* 29.3*  MCV 88.7 87.8 96.4  PLT 173 180 867    Basic Metabolic Panel: Recent Labs  Lab 04/15/20 0608 04/16/20 0506 04/17/20 0511 04/18/20 0605 04/19/20 0622 04/20/20 0501 04/20/20 1313  NA 140   < > 139 140 140 140 140  K 3.2*   < > 2.8* 3.6 3.6 3.4* 3.4*  CL 109   < > 108 111 110 107 108  CO2 17*   < > 16* 15* 16* 11* 13*  GLUCOSE 90   < > 119* 142* 105* 151* 117*  BUN 46*   < > 49* 52* 58* 60* 60*  CREATININE 2.31*   < > 2.44* 2.06* 2.65* 2.83* 2.91*  CALCIUM 9.9   < > 10.1 10.6* 11.1* 11.3* 11.3*  MG 1.7  --  2.0 2.0  --   --   --    < > = values in this interval not displayed.   GFR: Estimated Creatinine Clearance: 25.6 mL/min (A) (by C-G formula based on SCr of 2.91 mg/dL (H)). Recent Labs  Lab 04/14/20 0529 04/15/20 0608 04/16/20 0506 04/20/20 0501 04/20/20 1020 04/20/20 1313  PROCALCITON 0.39 0.32 0.29  --   --   --   WBC 12.3* 11.6*  --  13.4*  --   --   LATICACIDVEN  --   --   --   --  2.0* 1.7    Liver Function Tests: Recent Labs  Lab  04/15/20 0608 04/16/20 0506 04/18/20 0605 04/19/20 0622 04/20/20 0501  AST 13* 10* 10* 10* 9*  ALT <5 <5 <5 <5 <5  ALKPHOS 69 72 73 77 77  BILITOT 0.4 0.7 0.6 0.4 0.7  PROT 5.9* 6.0* 6.2* 6.3* 6.2*  ALBUMIN 1.6* 1.5* 1.5* 1.6* 1.6*   Recent Labs  Lab 04/18/20 0605 04/20/20 1313  LIPASE 40 40  No results for input(s): AMMONIA in the last 168 hours.  ABG    Component Value Date/Time   HCO3 14.6 (L) 04/09/2020 1413   TCO2 29 08/16/2007 2046   ACIDBASEDEF 11.6 (H) 04/03/2020 1413   O2SAT 86.3 04/13/2020 1413     Coagulation Profile: Recent Labs  Lab 04/20/20 0930  INR 9.6*    Cardiac Enzymes: No results for input(s): CKTOTAL, CKMB, CKMBINDEX, TROPONINI in the last 168 hours.  HbA1C: Hgb A1c MFr Bld  Date/Time Value Ref Range Status  03/19/2020 11:11 AM 10.4 (H) 4.6 - 6.5 % Final    Comment:    Glycemic Control Guidelines for People with Diabetes:Non Diabetic:  <6%Goal of Therapy: <7%Additional Action Suggested:  >8%   09/13/2019 03:57 PM 6.6 (H) <5.7 % of total Hgb Final    Comment:    For someone without known diabetes, a hemoglobin A1c value of 6.5% or greater indicates that they may have  diabetes and this should be confirmed with a follow-up  test. . For someone with known diabetes, a value <7% indicates  that their diabetes is well controlled and a value  greater than or equal to 7% indicates suboptimal  control. A1c targets should be individualized based on  duration of diabetes, age, comorbid conditions, and  other considerations. . Currently, no consensus exists regarding use of hemoglobin A1c for diagnosis of diabetes for children. .     CBG: Recent Labs  Lab 04/19/20 2042 04/20/20 0009 04/20/20 0407 04/20/20 0757 04/20/20 1139  GLUCAP 122* 116* 142* 135* 102*    Review of Systems: Positives in [place]   Gen: Denies fever, chills, weight change, fatigue, weakness, night sweats HEENT: Denies blurred vision, double vision, hearing loss,  tinnitus, sinus congestion, rhinorrhea, sore throat, neck stiffness, dysphagia PULM: Denies shortness of breath, cough, sputum production, hemoptysis, wheezing CV: Denies chest pain, edema, orthopnea, paroxysmal nocturnal dyspnea, palpitations GI: Denies abdominal pain, nausea, vomiting, diarrhea, hematochezia, melena, constipation, change in bowel habits GU: Denies dysuria, hematuria, polyuria, oliguria, urethral discharge Endocrine: Denies hot or cold intolerance, polyuria, polyphagia or appetite change Derm: Denies rash, dry skin, scaling or peeling skin change Heme: Denies easy bruising, bleeding, bleeding gums Neuro: Denies headache, numbness, weakness, slurred speech, loss of memory or consciousness. Frequent falls  Past Medical History:  She,  has a past medical history of Cellulitis, Depression, Diabetes mellitus without complication ([place]), Hyperlipidemia, Hypertension, Lung mass (04/15/2020), Pancreatic mass (04/15/2020), Renal insufficiency, Seasonal allergies, and Thyroid nodule (04/15/2020).   Surgical History:   Past Surgical History:  Procedure Laterality Date  . BACK SURGERY    . BIOPSY  04/11/2020   Procedure: BIOPSY;  Surgeon: Thornton Park, MD;  Location: [facility];  Service: Gastroenterology;;  . BREAST SURGERY     lumpectomy left breast  . COLONOSCOPY WITH PROPOFOL N/A 03/22/2020   Procedure: COLONOSCOPY WITH PROPOFOL;  Surgeon: Thornton Park, MD;  Location: [facility];  Service: Gastroenterology;  Laterality: N/A;  . ESOPHAGOGASTRODUODENOSCOPY (EGD) WITH PROPOFOL N/A 03/14/2020   Procedure: ESOPHAGOGASTRODUODENOSCOPY (EGD) WITH PROPOFOL;  Surgeon: Thornton Park, MD;  Location: [facility];  Service: Gastroenterology;  Laterality: N/A;  . I & D EXTREMITY Left 04/02/2020   Procedure: IRRIGATION AND DEBRIDEMENT elbow;  Surgeon: Tania Ade, MD;  Location: [facility];  Service: Orthopedics;  Laterality: Left;  . LUMBAR FUSION    . POLYPECTOMY  03/25/2020    Procedure: POLYPECTOMY;  Surgeon: Thornton Park, MD;  Location: [facility];  Service: Gastroenterology;;  . TEE WITHOUT CARDIOVERSION N/A 04/05/2020  Procedure: TRANSESOPHAGEAL ECHOCARDIOGRAM (TEE);  Surgeon: Buford Dresser, MD;  Location: [facility] ENDOSCOPY;  Service: Cardiovascular;  Laterality: N/A;     Social History:   reports that she has never smoked. She has never used smokeless tobacco. She reports that she does not drink alcohol and does not use drugs.   Family History:  Her family history includes Cancer in her maternal aunt and maternal grandmother; Cancer (age of onset: 60) in her father; Depression in her father; Diabetes in her brother; Hyperlipidemia in her mother; Hypertension in her mother; Stroke in her mother.   Allergies No Known Allergies   Home Medications  Prior to Admission medications   Medication Sig Start Date End Date Taking? Authorizing Provider  atorvastatin (LIPITOR) 40 MG tablet Take 1 tablet (40 mg total) by mouth daily. 03/19/20  Yes Roma Schanz R, DO  ceFAZolin (ANCEF) IVPB Inject 2 g into the vein every 8 (eight) hours. Indication:  MSSA bacteremia w/ associated olecranon bursitis   First Dose: Yes Last Day of Therapy:  05/05/2020 Labs - Once weekly:  CBC/D and BMP, Labs - Every other week:  ESR and CRP Method of administration: IV Push Method of administration may be changed at the discretion of home infusion pharmacist based upon assessment of the patient and/or caregiver's ability to self-administer the medication ordered. 04/06/20 05/16/20 Yes Mercy Riding, MD  fenofibrate 160 MG tablet Take 1 tablet (160 mg total) by mouth daily. 03/19/20  Yes Roma Schanz R, DO  glipiZIDE (GLUCOTROL) 5 MG tablet Take 0.5 tablets (2.5 mg total) by mouth daily before breakfast. 11/25/19  Yes Shamleffer, Melanie Crazier, MD  Insulin Pen Needle 31G X 8 MM MISC 1 daily 03/19/20  Yes Carollee Herter, Kendrick Fries R, DO  Lancets [facility] ULTRASOFT) lancets Use as  instructed 10/02/17  Yes Roma Schanz R, DO  lisinopril-hydrochlorothiazide (ZESTORETIC) 20-12.5 MG tablet Take 2 tablets by mouth daily. 03/19/20  Yes Ann Held, DO  methocarbamol (ROBAXIN-750) 750 MG tablet Take 1 tablet (750 mg total) by mouth 4 (four) times daily. 03/19/20  Yes Roma Schanz R, DO  metoprolol succinate (TOPROL-XL) 100 MG 24 hr tablet Take with or immediately following a meal. 03/19/20  Yes Roma Schanz R, DO  sertraline (ZOLOFT) 100 MG tablet Take 1 tablet (100 mg total) by mouth daily. 03/19/20  Yes Ann Held, DO  cyclobenzaprine (FLEXERIL) 5 MG tablet Take 0.5-1 tablets (2.5-5 mg total) by mouth 3 (three) times daily as needed for muscle spasms. DO NOT DRINK ALCOHOL OR DRIVE WHILE TAKING THIS MEDICATION 02/29/20   Volney American, PA-C  glucose blood [facility] VERIO) test strip USE AS DIRECTED TO CHECK BLOOD SUGAR ONCE DAILY Patient not taking: Reported on 03/27/2020 07/31/19   Carollee Herter, Alferd Apa, DO  insulin glargine (LANTUS SOLOSTAR) 100 UNIT/ML Solostar Pen Inject 10 Units into the skin daily. Patient not taking: Reported on 04/09/2020 03/19/20   Ann Held, DO     Critical care time: 76 minutes     Noe Gens, MSN, APRN, NP-C, AGACNP-BC [facility] 04/20/2020, 1:56 PM   Please see [email or web address] for pager details.   From 7A-7P if no response, please call [phone] After hours, please call ELink [phone]

## 2020-04-20 NOTE — Progress Notes (Signed)
Pharmacy Antibiotic Note  Deborah Figueroa is a 65 y.o. female admitted on 04/07/2020 with DKA, MSSA bacteremia related to left olecranon bursitis and osteomyelitis s/p I&D on 1/10.  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  She has been on Cefazolin.  For acute decompensation today, Pharmacy has been consulted to expand coverage to Cefepime dosing.  Plan: Metronidazole per MD. Cefepime 2g IV q24h Follow up renal function, culture results, and clinical course.   Height: 5\' 11"  (180.3 cm) Weight: 101.5 kg (223 lb 12.3 oz) IBW/kg (Calculated) : 70.8  Temp (24hrs), Avg:97.6 F (36.4 C), Min:97.4 F (36.3 C), Max:97.8 F (36.6 C)  Recent Labs  Lab 04/14/20 0529 04/15/20 0608 04/16/20 0506 04/17/20 0511 04/18/20 0605 04/19/20 0622 04/20/20 0501 04/20/20 1020  WBC 12.3* 11.6*  --   --   --   --  13.4*  --   CREATININE 2.35* 2.31* 2.31* 2.44* 2.06* 2.65* 2.83*  --   LATICACIDVEN  --   --   --   --   --   --   --  2.0*    Estimated Creatinine Clearance: 26.3 mL/min (A) (by C-G formula based on SCr of 2.83 mg/dL (H)).    No Known Allergies  Antimicrobials this admission: 1/9 Nafcillin >> 1/10 1/10 Cefazolin>> 1/17, 1/23 >> 2/7 (original stop date 2/22) 1/17 Zosyn >> 1/18 1/18 Cefepime>> 1/23, resume 2/7 >> 1/27 fluconazole (thrush) >> (2/7) 2/7 Metronidazole >>   Dose adjustments this admission:  Microbiology results: 1/8 BCx: MSSA 1/9: BCx: 2/2 show GPC - will not run BCID - assumed to be same as previous culture per lab  1/10 fluid left olecranaon: MSSA 1/11 BCx: NGF 1/16 BCx: ng-final 1/17 UCx: 100k col yeast - final 2/7 BCx: 2/7 UCx:  2/7 MRSA PCR: negative  Thank you for allowing pharmacy to be a part of this patient's care.  Gretta Arab PharmD, BCPS Clinical Pharmacist [facility] main pharmacy [phone] 04/20/2020 1:28 PM

## 2020-04-20 NOTE — Progress Notes (Signed)
INFECTIOUS DISEASE PROGRESS NOTE  ID: Deborah Figueroa is a 65 y.o. female with  Principal Problem:   MSSA bacteremia Active Problems:   Hypercalcemia   Depression, major, single episode, moderate (HCC)   DKA (diabetic ketoacidosis) (HCC)   Acute pain of right shoulder   Left elbow pain   Acute kidney injury ([place])   Weakness   Recurrent falls   Sepsis due to methicillin susceptible Staphylococcus aureus ([place])   Osteomyelitis of left elbow (HCC)   Osteomyelitis of left ulna (HCC)   Sepsis ([place])   Blood in the stool   Anemia due to GI blood loss   Coagulopathy (HCC)   Duodenal ulcer   Acute gastric ulcer without hemorrhage or perforation   Adenomatous polyp of ascending colon   Adenomatous polyp of descending colon   Stercoral ulcer of rectum   Fecal impaction (HCC)   Lung mass   Thyroid nodule   Pancreatic mass   Aortic atherosclerosis (HCC)   Discitis   Vertebral osteomyelitis (HCC)   Lung abscess (HCC)  Subjective: Worsening over last 24h, hypotension requiring IVF, worsening Cr (2.91) and acidosis. INR 1.3 --> 9.6. Her WBC is unchanged.   See has no SOB or cough. She has improved BM after enema.  Pan-cultured.  No fever.  Her plain film abd was (-), CXR showed: Bibasilar atelectasis or infiltrates are noted. Probable small left pleural effusion.   Abtx:  Anti-infectives (From admission, onward)   Start     Dose/Rate Route Frequency Ordered Stop   04/20/20 1400  metroNIDAZOLE (FLAGYL) IVPB 500 mg        500 mg 100 mL/hr over 60 Minutes Intravenous Every 8 hours 04/20/20 1313     04/20/20 1400  ceFEPIme (MAXIPIME) 2 g in sodium chloride 0.9 % 100 mL IVPB  Status:  Discontinued        2 g 200 mL/hr over 30 Minutes Intravenous Every 24 hours 04/20/20 1330 04/20/20 1356   04/19/20 2200  ceFAZolin (ANCEF) IVPB 2g/100 mL premix  Status:  Discontinued        2 g 200 mL/hr over 30 Minutes Intravenous Every 12 hours 04/19/20 1501 04/19/20 1513   04/19/20 1600   ceFAZolin (ANCEF) IVPB 2g/100 mL premix  Status:  Discontinued        2 g 200 mL/hr over 30 Minutes Intravenous Every 8 hours 04/19/20 1513 04/20/20 1316   04/17/20 1000  fluconazole (DIFLUCAN) IVPB 100 mg        100 mg 50 mL/hr over 60 Minutes Intravenous Every 24 hours 04/16/20 1229 04/20/20 1215   03/15/2020 1400  ceFAZolin (ANCEF) IVPB 2g/100 mL premix  Status:  Discontinued        2 g 200 mL/hr over 30 Minutes Intravenous Every 8 hours 04/03/2020 0735 04/19/20 1501   04/09/20 1300  fluconazole (DIFLUCAN) tablet 100 mg  Status:  Discontinued        100 mg Oral Daily 04/09/20 1222 04/16/20 1227   04/06/20 0000  ceFAZolin (ANCEF) IVPB        2 g Intravenous Every 8 hours 04/06/20 1604 05/16/20 2359   04/05/20 1800  ceFAZolin (ANCEF) IVPB 2g/100 mL premix  Status:  Discontinued        2 g 200 mL/hr over 30 Minutes Intravenous Every 12 hours 04/05/20 1345 03/15/2020 0735   04/05/20 1400  ceFAZolin (ANCEF) IVPB 2g/100 mL premix  Status:  Discontinued        2 g 200 mL/hr over 30  Minutes Intravenous Every 8 hours 04/05/20 1301 04/05/20 1345   04/02/20 0522  ceFEPIme (MAXIPIME) 2 g in sodium chloride 0.9 % 100 mL IVPB  Status:  Discontinued        2 g 200 mL/hr over 30 Minutes Intravenous Every 24 hours 04/01/20 0932 04/05/20 1215   03/31/20 1800  ceFEPIme (MAXIPIME) 2 g in sodium chloride 0.9 % 100 mL IVPB  Status:  Discontinued        2 g 200 mL/hr over 30 Minutes Intravenous Every 12 hours 03/31/20 1244 04/01/20 0932   03/30/20 1030  piperacillin-tazobactam (ZOSYN) IVPB 3.375 g  Status:  Discontinued        3.375 g 12.5 mL/hr over 240 Minutes Intravenous Every 8 hours 03/30/20 1006 03/31/20 1236   03/29/20 1300  fluconazole (DIFLUCAN) tablet 150 mg  Status:  Discontinued        150 mg Oral  Once 03/29/20 1201 04/09/20 1222   03/20/2020 1145  ceFAZolin (ANCEF) IVPB 2g/100 mL premix  Status:  Discontinued        2 g 200 mL/hr over 30 Minutes Intravenous Every 8 hours 03/19/2020 1055 03/30/20 0951    03/26/2020 1100  nafcillin 12 g in sodium chloride 0.9 % 500 mL continuous infusion  Status:  Discontinued        12 g 20.8 mL/hr over 24 Hours Intravenous Every 24 hours 03/22/2020 0949 03/14/2020 1055   03/22/20 1500  nafcillin 2 g in sodium chloride 0.9 % 100 mL IVPB  Status:  Discontinued        2 g 200 mL/hr over 30 Minutes Intravenous Every 4 hours 03/22/20 1354 03/29/2020 0949   03/22/20 1345  nafcillin injection 2 g  Status:  Discontinued        2 g Intravenous Every 4 hours 03/22/20 1344 03/22/20 1354   03/22/20 1300  ceFAZolin (ANCEF) IVPB 2g/100 mL premix  Status:  Discontinued        2 g 200 mL/hr over 30 Minutes Intravenous Every 8 hours 03/22/20 1257 03/22/20 1344      Medications:  Scheduled: . (feeding supplement) PROSource Plus  30 mL Oral TID BM  . Chlorhexidine Gluconate Cloth  6 each Topical Daily  . feeding supplement (GLUCERNA SHAKE)  237 mL Oral TID BM  . folic acid  1 mg Oral Daily  . insulin aspart  0-15 Units Subcutaneous Q4H  . mouth rinse  15 mL Mouth Rinse BID  . nystatin  5 mL Oral QID  . nystatin   Topical TID  . pantoprazole  40 mg Oral BID  . polyethylene glycol  17 g Oral BID  . sertraline  100 mg Oral Daily  . sodium bicarbonate  650 mg Oral TID  . sucralfate  1 g Oral TID WC & HS    Objective: Vital signs in last 24 hours: Temp:  [97.5 F (36.4 C)-97.8 F (36.6 C)] 97.5 F (36.4 C) (02/07 1100) Pulse Rate:  [67-77] 73 (02/07 1330) Resp:  [15-23] 15 (02/07 1330) BP: (82-95)/(37-53) 86/41 (02/07 1330) SpO2:  [98 %-100 %] 98 % (02/07 1330) Weight:  [101.5 kg] 101.5 kg (02/07 1100)   General appearance: alert and no distress Resp: clear to auscultation bilaterally Cardio: regular rate and rhythm GI: normal findings: bowel sounds normal and soft, non-tender Extremities: edema anasarca and L elbow is non-tender, no heat.  Lab Results Recent Labs    04/20/20 0501 04/20/20 1313  WBC 13.4*  --   HGB 8.4*  --  HCT 29.3*  --   NA 140 140   K 3.4* 3.4*  CL 107 108  CO2 11* 13*  BUN 60* 60*  CREATININE 2.83* 2.91*   Liver Panel Recent Labs    04/19/20 0622 04/20/20 0501  PROT 6.3* 6.2*  ALBUMIN 1.6* 1.6*  AST 10* 9*  ALT <5 <5  ALKPHOS 77 77  BILITOT 0.4 0.7   Sedimentation Rate No results for input(s): ESRSEDRATE in the last 72 hours. C-Reactive Protein No results for input(s): CRP in the last 72 hours.  Microbiology: Recent Results (from the past 240 hour(s))  MRSA PCR Screening     Status: None   Collection Time: 04/20/20 11:06 AM   Specimen: Nasal Mucosa; Nasopharyngeal  Result Value Ref Range Status   MRSA by PCR NEGATIVE NEGATIVE Final    Comment:        The GeneXpert MRSA Assay (FDA approved for NASAL specimens only), is one component of a comprehensive MRSA colonization surveillance program. It is not intended to diagnose MRSA infection nor to guide or monitor treatment for MRSA infections. Performed at [facility], [place] [address], [place], [place] [address]     Studies/Results: DG CHEST PORT 1 VIEW  Result Date: 04/20/2020 CLINICAL DATA:  Sepsis. EXAM: PORTABLE CHEST 1 VIEW COMPARISON:  April 14, 2020. FINDINGS: Stable cardiomegaly. No pneumothorax is noted. Bibasilar atelectasis or infiltrates are noted. Probable small left pleural effusion is noted. Bony thorax is unremarkable. IMPRESSION: Bibasilar atelectasis or infiltrates are noted. Probable small left pleural effusion. Aortic Atherosclerosis (ICD10-I70.0). Electronically Signed   By: Marijo Conception M.D.   On: 04/20/2020 09:18   DG Abd Portable 1V  Result Date: 04/20/2020 CLINICAL DATA:  Abdominal discomfort. EXAM: PORTABLE ABDOMEN - 1 VIEW COMPARISON:  April 18, 2020. FINDINGS: The bowel gas pattern is normal. No radio-opaque calculi or other significant radiographic abnormality are seen. IMPRESSION: Negative. Electronically Signed   By: Marijo Conception M.D.   On: 04/20/2020 09:19   US THYROID  Result Date:  04/19/2020 CLINICAL DATA:  Nodule EXAM: THYROID ULTRASOUND TECHNIQUE: Ultrasound examination of the thyroid gland and adjacent soft tissues was performed. COMPARISON:  None. FINDINGS: Parenchymal Echotexture: Normal Isthmus: 0.6 cm Right lobe: 4.7 x 1.5 x 1.9 cm Left lobe: 6 x 1.7 x 2.1 cm _________________________________________________________ Estimated total number of nodules >/= 1 cm: 1 Number of spongiform nodules >/=  2 cm not described below (TR1): 0 Number of mixed cystic and solid nodules >/= 1.5 cm not described below (TR2): 0 _________________________________________________________ Nodule # 1: Location: Left; Inferior Maximum size: 3 cm; Other 2 dimensions: 2.4 x 2.2 cm Composition: solid/almost completely solid (2) Echogenicity: hypoechoic (2) Shape: not taller-than-wide (0) Margins: ill-defined (0) Echogenic foci: none (0) ACR TI-RADS total points: 4. ACR TI-RADS risk category: TR4 (4-6 points). ACR TI-RADS recommendations: **Given size (>/= 1.5 cm) and appearance, fine needle aspiration of this moderately suspicious nodule should be considered based on TI-RADS criteria. _________________________________________________________ IMPRESSION: There is a 3 cm TR 4 thyroid nodule in the left inferior thyroid gland. Fine-needle aspiration is recommended for this thyroid nodule. The above is in keeping with the ACR TI-RADS recommendations - J Am Coll Radiol 2017;14:587-595. Electronically Signed   By: Constance Holster M.D.   On: 04/19/2020 18:47   VAS [place] UPPER EXTREMITY VENOUS DUPLEX  Result Date: 04/20/2020 UPPER VENOUS STUDY  Indications: Ongoing edema RUE Comparison Study: Previous RUE study on 04/01/20 was positive for Acute SVT of  cephalic vein at [facility] fossa. Performing Technologist: Rogelia Rohrer  Examination Guidelines: A complete evaluation includes B-mode imaging, spectral Doppler, color Doppler, and power Doppler as needed of all accessible portions of each vessel. Bilateral testing  is considered an integral part of a complete examination. Limited examinations for reoccurring indications may be performed as noted.  Right Findings: +----------+------------+---------+-----------+----------+--------------+ RIGHT     CompressiblePhasicitySpontaneousProperties   Summary     +----------+------------+---------+-----------+----------+--------------+ IJV           Full       Yes       Yes                             +----------+------------+---------+-----------+----------+--------------+ Subclavian    Full       Yes       Yes                             +----------+------------+---------+-----------+----------+--------------+ Axillary      Full       Yes       Yes                             +----------+------------+---------+-----------+----------+--------------+ Brachial      Full       Yes       Yes                             +----------+------------+---------+-----------+----------+--------------+ Radial        Full                                  Not visualized +----------+------------+---------+-----------+----------+--------------+ Ulnar         Full                                  Not visualized +----------+------------+---------+-----------+----------+--------------+ Cephalic      None       No        No                  Chronic     +----------+------------+---------+-----------+----------+--------------+ Basilic       Full       Yes       Yes                             +----------+------------+---------+-----------+----------+--------------+ 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 Findings for superficial vein thrombosis appear unchanged from previous study.  Left: No evidence of thrombosis in the subclavian.  *See table(s) above for measurements and observations.    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  Drug resistant bacterial UTI is possible.    Hopefully CT abd/pelvlis soon to, her prev question of rectovaginal fistula would be worth repeat imaging. She denies abd pain and her abd exam is benign.        Bobby Rumpf MD, FACP Infectious Diseases (pager) [phone] [email or web address] 04/20/2020, 2:18 PM  LOS: 30 days

## 2020-04-20 NOTE — Consult Note (Signed)
Renal Service Consult Note [facility]  Deborah Figueroa [birth year] Sol Blazing, MD Requesting Physician: Dr Sarajane Jews  Reason for Consult: Renal failure HPI: The patient is a 65 y.o. year-old w/ hx of HTN, HL, DM on insulin, depression, obesity presented on 1/8 w/ gen weakness and DKA, rx'd w/ IV insulin. Found to have L elbow infection / osteo w/ MSSA bacteremia. Pt was homeless so plan was for IV abx for 6 wks as inpatient. Had GIB and had EGD / colon on 1/27 showing stercoral ulcer and gastric ulcer. Then had resp distress and suspected PNA. Imaging showed possible pulm mass, possible pathologic fx's of the spine. BP's dropped and pt developed AKI. Asked to see for renal failure.    Pt seen in ICU. No specific c/o's.  Baseline creat is around 1.8- 2.0. Foley in place w/ good UOP. BP's have been very good for entire 30 day stay except for the last 36 hrs , BP's dropped into the 80's and 90's. Pt denies any SOB or CP.       ROS  denies CP  no joint pain   no HA  no blurry vision  no rash  no diarrhea  no nausea/ vomiting  no dysuria  no difficulty voiding  no change in urine color    Past Medical History  Past Medical History:  Diagnosis Date  . Cellulitis   . Depression   . Diabetes mellitus without complication ([place])   . Hyperlipidemia   . Hypertension   . Lung mass 04/15/2020  . Pancreatic mass 04/15/2020  . Renal insufficiency   . Seasonal allergies   . Thyroid nodule 04/15/2020   Past Surgical History  Past Surgical History:  Procedure Laterality Date  . BACK SURGERY    . BIOPSY  03/20/2020   Procedure: BIOPSY;  Surgeon: Thornton Park, MD;  Location: [facility] ENDOSCOPY;  Service: Gastroenterology;;  . BREAST SURGERY     lumpectomy left breast  . COLONOSCOPY WITH PROPOFOL N/A 03/30/2020   Procedure: COLONOSCOPY WITH PROPOFOL;  Surgeon: Thornton Park, MD;  Location: [facility]OSCOPY;  Service: Gastroenterology;  Laterality: N/A;  .  ESOPHAGOGASTRODUODENOSCOPY (EGD) WITH PROPOFOL N/A 04/12/2020   Procedure: ESOPHAGOGASTRODUODENOSCOPY (EGD) WITH PROPOFOL;  Surgeon: Thornton Park, MD;  Location: [facility] ENDOSCOPY;  Service: Gastroenterology;  Laterality: N/A;  . I & D EXTREMITY Left 04/01/2020   Procedure: IRRIGATION AND DEBRIDEMENT elbow;  Surgeon: Tania Ade, MD;  Location: [facility] ORS;  Service: Orthopedics;  Laterality: Left;  . LUMBAR FUSION    . POLYPECTOMY  04/04/2020   Procedure: POLYPECTOMY;  Surgeon: Thornton Park, MD;  Location: [facility] ENDOSCOPY;  Service: Gastroenterology;;  . TEE WITHOUT CARDIOVERSION N/A 03/25/2020   Procedure: TRANSESOPHAGEAL ECHOCARDIOGRAM (TEE);  Surgeon: Buford Dresser, MD;  Location: [facility] ENDOSCOPY;  Service: Cardiovascular;  Laterality: N/A;   Family History  Family History  Problem Relation Age of Onset  . Stroke Mother   . Hypertension Mother   . Hyperlipidemia Mother   . Cancer Father 38       liver and colon  . Depression Father   . Cancer Maternal Aunt        breast  . Diabetes Brother   . Cancer Maternal Grandmother        breast   Social History  reports that she has never smoked. She has never used smokeless tobacco. She reports that she does not drink alcohol and does not use drugs. Allergies No Known Allergies Home medications Prior to Admission medications  Medication Sig Start Date End Date Taking? Authorizing Provider  atorvastatin (LIPITOR) 40 MG tablet Take 1 tablet (40 mg total) by mouth daily. 03/19/20  Yes Roma Schanz R, DO  ceFAZolin (ANCEF) IVPB Inject 2 g into the vein every 8 (eight) hours. Indication:  MSSA bacteremia w/ associated olecranon bursitis   First Dose: Yes Last Day of Therapy:  05/05/2020 Labs - Once weekly:  CBC/D and BMP, Labs - Every other week:  ESR and CRP Method of administration: IV Push Method of administration may be changed at the discretion of home infusion pharmacist based upon assessment of the patient and/or  caregiver's ability to self-administer the medication ordered. 04/06/20 05/16/20 Yes Mercy Riding, MD  fenofibrate 160 MG tablet Take 1 tablet (160 mg total) by mouth daily. 03/19/20  Yes Roma Schanz R, DO  glipiZIDE (GLUCOTROL) 5 MG tablet Take 0.5 tablets (2.5 mg total) by mouth daily before breakfast. 11/25/19  Yes Shamleffer, Melanie Crazier, MD  Insulin Pen Needle 31G X 8 MM MISC 1 daily 03/19/20  Yes Carollee Herter, Kendrick Fries R, DO  Lancets [facility] ULTRASOFT) lancets Use as instructed 10/02/17  Yes Roma Schanz R, DO  lisinopril-hydrochlorothiazide (ZESTORETIC) 20-12.5 MG tablet Take 2 tablets by mouth daily. 03/19/20  Yes Ann Held, DO  methocarbamol (ROBAXIN-750) 750 MG tablet Take 1 tablet (750 mg total) by mouth 4 (four) times daily. 03/19/20  Yes Roma Schanz R, DO  metoprolol succinate (TOPROL-XL) 100 MG 24 hr tablet Take with or immediately following a meal. 03/19/20  Yes Roma Schanz R, DO  sertraline (ZOLOFT) 100 MG tablet Take 1 tablet (100 mg total) by mouth daily. 03/19/20  Yes Ann Held, DO  cyclobenzaprine (FLEXERIL) 5 MG tablet Take 0.5-1 tablets (2.5-5 mg total) by mouth 3 (three) times daily as needed for muscle spasms. DO NOT DRINK ALCOHOL OR DRIVE WHILE TAKING THIS MEDICATION 02/29/20   Volney American, PA-C  glucose blood [facility] VERIO) test strip USE AS DIRECTED TO CHECK BLOOD SUGAR ONCE DAILY Patient not taking: Reported on 03/29/2020 07/31/19   Carollee Herter, Alferd Apa, DO  insulin glargine (LANTUS SOLOSTAR) 100 UNIT/ML Solostar Pen Inject 10 Units into the skin daily. Patient not taking: Reported on 04/13/2020 03/19/20   Carollee Herter, Kendrick Fries R, DO     Vitals:   04/20/20 1145 04/20/20 1212 04/20/20 1242 04/20/20 1330  BP: (!) 91/51 (!) 90/40 (!) 82/37 (!) 86/41  Pulse: 67 72 68 73  Resp: _0 Temp:      TempSrc:      SpO2: 100% 100% 100% 98%  Weight:      Height:       Exam Gen alert and in nad No rash, cyanosis or  gangrene Sclera anicteric, throat clear  No jvd or bruits Chest clear bilat to bases RRR no MRG Abd soft ntnd no mass or ascites +bs GU foley w/ clear light yellow urine in good amts MS no joint effusions or deformity Ext diffuse dependent 2+ hip and UE edema, no wounds or ulcers Neuro is alert, Ox 3 , nf    Home meds:  - lipitor 40/ fenofibrate 160/ lisinopril - hctz 20-12.5 qd/ toprol xl 100 qd  - insulin glargein 10u qd/ glucotrol 2.5 qam  - zoloft 100 qd/  Robaxin 750 qid/ flexeril prn  - prn's/ vitamins/ supplements    Date    Creat  eGFR ml/min   2008- 15  0.7- 1.1  2016- 19  1.1- 2.0 38- 48 stage IIIb    2020   2.33  22 stage IV   09/2019  1.88  30   10/2019  2.06  28    Mar 19, 2020  2.17  23   Jan 15  1.76  32   Jan 20  3.08  16   Jan 24  2.64  20   Jan 31  2.29  23    Feb 3  2.31  23    Feb 6  2.65  20    Feb 7  2.91  18     CXR 2/01 - IMPRESSION: Progressive left lower lobe consolidation and left effusion. Mild right lower lobe airspace disease slightly improved.    CXR 2/07 - IMPRESSION: Bibasilar atelectasis or infiltrates are noted. Probable small left pleural effusion.     I/O lastl 1 week = 6.1 L in and 6.5 L out = net even     Wt 1/10 (admit 1/08) - 93kg      Wt 2/07 today - 101 kg        BP's normal on admit and up until the last 36 hrs BP's are in the mid 80's now    Ca 11.3   BN 60  Cr 2.9  K 3.4  CO2 13  AG 19  Lipase 40          UA 2/07 - cloudy, large LE, prot 30, rare bact/ 6-10 rbc/ >50 wbcs   UNa 37, UCr 66   Renal US 03/21/20 - R 10.6cm, L 10.7 cm, no hydro either side        Assessment/ Plan: 1. AKI on CKD IV - b/l creat 1.8- 2.0, eGFR 28- 30 from 2021. Creat here mostly in 1.8- 2.6 range. Creat up to 2.9 today. BP's very soft the last 36 hrs, in the 80's. UA dirty, poss urosepsis. Hypercalcemic (corr Ca ~ 13). AKI due to shock and hyperCa++. Not symptomatic, alert. Will give calcitonin x 48 hrs and consider bisphosphonate if not improving.  With IVF"s UOP has improved today. BP's up in the 90's. Cont supportive care, will follow.  2. Hypotension - sepsis vs other. Started on IV maxipime, flagyl today. Cx's pending. Was on diflucan already for fungal UTI.  3. Volume - up 10kg , diffuse edema on exam, 3rd spacing d/t low alb +/- septic picture. Getting IVF's and tolerating, good UOP.  4. DKA - resolved 5. L olecranon osteo - per pmd 6. MSSA bacteremia/ L olecranon osteo/ cervical and thoracic osteo+ discitis - on abx per primary team 7. Poor social support - reportedly is homeless.        Kelly Splinter  MD 04/20/2020, 2:17 PM  Recent Labs  Lab 04/15/20 0608 04/20/20 0501  WBC 11.6* 13.4*  HGB 8.3* 8.4*   Recent Labs  Lab 04/20/20 0501 04/20/20 1313  K 3.4* 3.4*  BUN 60* 60*  CREATININE 2.83* 2.91*  CALCIUM 11.3* 11.3*

## 2020-04-20 NOTE — CV Procedure (Signed)
RUE venous duplex completed.  Results can be found under chart review under CV PROC. 04/20/2020 11:35 AM Xitlaly Ault RVT, RDMS

## 2020-04-20 NOTE — Progress Notes (Signed)
CRITICAL VALUE ALERT  Critical Value: INR 9.6, Lactic 2.0  Date & Time Notied:  04/20/20 1020  Provider Notified: Yes  Orders Received/Actions taken: Bolus given

## 2020-04-20 NOTE — Progress Notes (Signed)
CRITICAL VALUE ALERT  Critical Value: INR 10.0  Date & Time Notied: 04/20/20 1545  Provider Notified: Yes  Orders Received/Actions taken: awaiting orders

## 2020-04-20 NOTE — Progress Notes (Addendum)
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  Developed acute respiratory failure secondary pneumonia as well as blood loss anemia from epistaxis.  Underwent EGD and colonoscopy showing stercoral ulcer and gastric ulcer.  Imaging suggested lung mass concerning for malignancy, abnormal pancreatic mass, thyroid nodule.  MRI obtained however more suggestive of fluid and infection rather than mass. discussed with neurosurgery, recommended continuing antibiotics, no operative intervention, no indication for IR drainage.  Continues on antibiotics for cervical and thoracic paravertebral infection, thoracic discitis and osteomyelitis.  Now with hypotension, developing AKI, new elevated anion gap metabolic acidosis and concern for developing sepsis.  transferring to stepdown unit, sepsis work-up.  A & P  New anion gap metabolic acidosis, hypotension, AKI --No new symptoms.  Has had persistent but variable nausea.  Abdominal exam is benign.  She is afebrile.  No localizing signs on exam.  Skin appears unremarkable. --Differential would include developing sepsis, blood sugars are well controlled, not no reason to suspect euglycemic DKA.   --Received 1 L of fluid without effect, will transfer to stepdown, bolus additional IV fluids.  Septic work-up including blood cultures, urine culture, lactic acid, chest and abdominal imaging.  NPO.  Source unclear without localizing signs or symptoms.  She had her gallbladder imaged within the last month which showed cholelithiasis.  She has no abdominal pain on exam and LFTs are  within normal limits.  Lipase was normal within the last few days. --Once stabilized, will obtain noncontrast CT of the abdomen and pelvis to reevaluate pancreas but exam is benign and lipase and LFTs were within normal limits.  No signs to suggest new intra-abdominal process. --We will ask ID to reevaluate for further recommendations.  Have consulted nephrology as well for recommendations in regard to AKI.  Will repeat urinalysis and urine studies.  Wonder if could have a component of RTA.  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 --Continue antibiotics through 2/22. --Continue OT for triceps activation.  Follow-up with [facility] as an outpatient. --Continue Ancef  C6-T2 and T7/8 discitis, osteomyelitis.  Discussed with neurosurgery Dr. Reatha Armour, who recommended ongoing antibiotics, no role for surgical intervention or IR drainage. --Per ID considering prolonged length of therapy to 8 weeks and oral antibiotics after that.  Repeat imaging in 8 weeks. --Consider lumbar MRI when stable  Lung abscess right apex.  Discussed with pulmonology 2/5.  Recommends antibiotics, repeat study in the outpatient setting to ensure resolution.  Could consider IR aspiration. --No hypoxia and no pulmonary symptoms.  Will repeat chest x-ray given above.  Abnormal appearance of the pancreas, concerning for pancreatic tail mass --As above not stable for MRI at this point.  AKI superimposed on CKD stage IIIb -baseline unclear, but probably around 1.7.  Plan as above.  Primary hyperparathyroidism with hypercalcemia.  Followed by endocrinology as an outpatient, noted to be a candidate for surgery prior to admission.   --Follow-up with ENT after completion of treatment for bacteremia. --Calcium trending back up, corrected 12.8 falling in the moderate range.  She is asymptomatic except for possibly nausea  and  decreased bowel motility.  Discussed with nephrology, recommended against pamidronate at this point.  Does not require immediate treatment at this point.  Will explore further.  Persistent nausea.  Etiology and significance unclear.  Lipase within normal limits.   --Exam remains benign.  Plan as above.  Diabetes mellitus type 2 with hemoglobin A1c 10.4. -- blood sugars remain stable.  Generalized weakness with recurrent falls in the setting of hypercalcemia, bacteremia and DKA.  MRI brain negative. --unsafe d/c, cannot care for self, no support, homeless, no insurance. No bed offers from SNF.  Difficult to place.  Left-sided thyroid nodule minimally suppressed TSH with low free T3 and T4 within normal limits. --Recommend outpatient ultrasound, however given difficult disposition, will obtain in-house  Acute on chronic normocytic anemia, Hemoccult positive but no overt GI bleed.  Earlier in hospitalization had epistaxis and coagulopathy, gum bleeding. Has required multiple transfusions. EGD/colonoscopy unrevealing. Hematology without specific recs at this point. Bone marrow suppression suspected as well. --Remained stable.  Continue periodic CBC.  Lower extremity edema is probably secondary to hypoalbuminemia.  Echocardiogram was unremarkable. --Appears better.  Continue TED hose.  Aortic atherosclerosis --No treatment indicated.  Stercoral ulcer, fecal impaction noted 1/28.  Asymptomatic.  Continue aggressive bowel regimen recommended to keep stool soft and prevent recurrence of fecal impaction.   --Asymptomatic other than perhaps nausea.    Rectovaginal fistula?.  Stool noted from the vagina?.  Consider GYN evaluation as outpatient.  Not clear whether this is actual. --Follow-up as an outpatient.  Appears to be asymptomatic at this time.  RESOLVED . Sepsis secondary to MSSA bacteremia  . DKA . Severe hypercalcemia superimposed on chronic hypercalcemia secondary to primary  hyperparathyroidism. Followed by endocrinology as an outpatient. Treated with bisphosphonate and calcitonin. . Acute hypoxemic respiratory failure with uncompensated respiratory acidosis secondary to bilateral lower lobe pneumonia . Acute metabolic encephalopathy.  MRI brain no acute finding. . Oropharyngeal thrush  ADDENDUM CT shows acute pancreatitis, pseudocysts, possible mass. NPO. Cortrak tomorrow and will ask for GI opinion.  Nutritional Assessment: Body mass index is 28.87 kg/m.Marland Kitchen Seen by dietician.  I agree with the assessment and plan as outlined below: Nutrition Status: Nutrition Problem: Increased nutrient needs Etiology: acute illness (sepsis) Signs/Symptoms: estimated needs Interventions: Prostat,MVI,Glucerna shake  Disposition Plan:  Discussion: worse today, plan as above, transfer to SDU. F/u lactic acid and response to bolus.   Status is: Inpatient  Remains inpatient appropriate because:Hemodynamically unstable, Persistent severe electrolyte disturbances, Ongoing diagnostic testing needed not appropriate for outpatient work up, IV treatments appropriate due to intensity of illness or inability to take PO and Inpatient level of care appropriate due to severity of illness   Dispo: The patient is from: Home              Anticipated d/c is to: uncertain              Anticipated d/c date is: > 3 days              Patient currently is not medically stable to d/c.   Difficult to place patient Yes  DVT prophylaxis: Place TED hose Start: 04/19/20 1604 SCDs Start: 03/31/2020 1608   Code Status: Full Code Level of care: Stepdown Family Communication: updated son Remo Lipps in detail by telephone  Murray Hodgkins, MD  [facility] Direct contact: see [email or web address] (further directions at bottom of note if needed) 7PM-7AM contact night coverage as at bottom of note 04/20/2020, 9:25 AM  LOS: 30 days  Significant Hospital Events   . 1/8 admit for DKA, falls, AKI,  hypercalcemia . 1/9 MSSA bacteremia, ID consult, ortho consult for elbow infection . 1/10 I&D . 1/13 ID signs off . 1/25 GI consult for anemia . 1/25 oncology consult   Consults:  . ID . Ortho . GI   Procedures:  . 1/10 I&D left septic olecranon bursitis including osteo of the olecranon . 1/28 colonoscopy.  Fecal impaction with associated stercoral ulcer.  Manual disimpaction.  Polyp. . 1/28 EGD.  Linear esophageal ulcer, nonbleeding gastric ulcer, erosive gastropathy, nonbleeding duodenal ulcers  Significant Diagnostic Tests:  . 1/11 TEE no endocarditis  Interval History/Subjective  CC: f/u infection  Hypotensive overnight, treated with 1 L IV fluids without effect. Patient asymptomatic other than persistent nausea, did have bilious vomiting this morning, no new pain, no shortness of breath.  Leg swelling better.  Objective   Vitals:  Vitals:   04/20/20 0809 04/20/20 0811  BP: (!) 84/42 (!) 88/52  Pulse:  77  Resp:  20  Temp:  (!) 97.5 F (36.4 C)  SpO2:  100%    Exam: Constitutional:   . Appears calm and comfortable lying in bed, clinically does not appear different today Eyes:  Marland Kitchen Appear grossly normal ENMT:  . grossly normal hearing  . Lips appear normal Respiratory:  . CTA bilaterally, no w/r/r.  . Respiratory effort normal. Cardiovascular:  . RRR, no m/r/g . Decreased bilateral 2+ LE extremity edema   Abdomen:  . Abdomen appears normal; no tenderness or masses . No hernias . Soft ntnd, no epigastric or RUQ pain Musculoskeletal:  . RUE edema wrist to elbow, nontender Skin:  . No rashes, lesions, ulcers . palpation of skin: no induration or nodules Neurologic:  . Grossly nonfocal Psychiatric:  . Mental status o Mood, affect appropriate o Orientated to self, [facility], month, year . Speech fluent and appropriate  I have personally reviewed the following:   Today's Data   CBG well controlled, potassium 3.4  BUN slightly higher at 60 and  has been trending up the last 72 hours, creatinine while improved 48 hours ago has significantly increased over the last 2 days, today 2.83  Calcium slowly trending up, corrected 12.8 falling in the moderate range.  Anion gap has jumped from 14 to 22 today  Albumin remains low at 1.6  WBC increased from 11.6-13.4  Hemoglobin stable at 8.4 and platelets are within normal limits.  Scheduled Meds: . (feeding supplement) PROSource Plus  30 mL Oral TID BM  . feeding supplement (GLUCERNA SHAKE)  237 mL Oral TID BM  . folic acid  1 mg Oral Daily  . insulin aspart  0-15 Units Subcutaneous Q4H  . nystatin  5 mL Oral QID  . nystatin   Topical TID  . pantoprazole  40 mg Oral BID  . polyethylene glycol  17 g Oral BID  . sertraline  100 mg Oral Daily  . sodium bicarbonate  650 mg Oral TID  . sucralfate  1 g Oral TID WC & HS   Continuous Infusions: .  ceFAZolin (ANCEF) IV 2 g (04/20/20 1448)  . fluconazole (DIFLUCAN) IV Stopped (04/19/20 1115)  . lactated ringers      Principal Problem:   MSSA bacteremia Active Problems:   Hypercalcemia   Depression, major, single episode, moderate (HCC)   DKA (diabetic ketoacidosis) (HCC)   Acute pain of right shoulder   Left elbow pain   Acute kidney injury ([place])   Weakness  Recurrent falls   Sepsis due to methicillin susceptible Staphylococcus aureus (HCC)   Osteomyelitis of left elbow (HCC)   Osteomyelitis of left ulna (HCC)   Sepsis (HCC)   Blood in the stool   Anemia due to GI blood loss   Coagulopathy (HCC)   Duodenal ulcer   Acute gastric ulcer without hemorrhage or perforation   Adenomatous polyp of ascending colon   Adenomatous polyp of descending colon   Stercoral ulcer of rectum   Fecal impaction (HCC)   Lung mass   Thyroid nodule   Pancreatic mass   Aortic atherosclerosis ([place])   Discitis   Vertebral osteomyelitis ([place])   Lung abscess ([place])   LOS: 30 days   How to contact the [facility] Attending or Consulting provider 7A -  7P or covering provider during after hours 7P -7A, for this patient?  1. Check the care team in [facility] and look for a) attending/consulting [facility] provider listed and b) the [facility] team listed 2. Log into [email or web address] and use [place]'s universal password to access. If you do not have the password, please contact the hospital operator. 3. Locate the [facility] provider you are looking for under [facility] and page to a number that you can be directly reached. 4. If you still have difficulty reaching the provider, please page the [facility] (Director on Call) for the Hospitalists listed on amion for assistance.

## 2020-04-21 ENCOUNTER — Inpatient Hospital Stay: Payer: Self-pay

## 2020-04-21 DIAGNOSIS — J852 Abscess of lung without pneumonia: Secondary | ICD-10-CM

## 2020-04-21 DIAGNOSIS — R579 Shock, unspecified: Secondary | ICD-10-CM

## 2020-04-21 LAB — GLUCOSE, CAPILLARY
Glucose-Capillary: 105 mg/dL — ABNORMAL HIGH (ref 70–99)
Glucose-Capillary: 107 mg/dL — ABNORMAL HIGH (ref 70–99)
Glucose-Capillary: 120 mg/dL — ABNORMAL HIGH (ref 70–99)
Glucose-Capillary: 130 mg/dL — ABNORMAL HIGH (ref 70–99)
Glucose-Capillary: 132 mg/dL — ABNORMAL HIGH (ref 70–99)
Glucose-Capillary: 134 mg/dL — ABNORMAL HIGH (ref 70–99)
Glucose-Capillary: 98 mg/dL (ref 70–99)

## 2020-04-21 LAB — COMPREHENSIVE METABOLIC PANEL
ALT: <5 U/L (ref 0–44)
AST: 13 U/L — ABNORMAL LOW (ref 15–41)
Albumin: 1.9 g/dL — ABNORMAL LOW (ref 3.5–5.0)
Alkaline Phosphatase: 68 U/L (ref 38–126)
Anion gap: 21 — ABNORMAL HIGH (ref 5–15)
BUN: 62 mg/dL — ABNORMAL HIGH (ref 8–23)
CO2: 10 mmol/L — ABNORMAL LOW (ref 22–32)
Calcium: 11.3 mg/dL — ABNORMAL HIGH (ref 8.9–10.3)
Chloride: 110 mmol/L (ref 98–111)
Creatinine, Ser: 2.86 mg/dL — ABNORMAL HIGH (ref 0.44–1.00)
GFR, Estimated: 18 mL/min — ABNORMAL LOW (ref >60)
Glucose, Bld: 113 mg/dL — ABNORMAL HIGH (ref 70–99)
Potassium: 4.6 mmol/L (ref 3.5–5.1)
Sodium: 141 mmol/L (ref 135–145)
Total Bilirubin: 1 mg/dL (ref 0.3–1.2)
Total Protein: 5.7 g/dL — ABNORMAL LOW (ref 6.5–8.1)

## 2020-04-21 LAB — LIPASE, BLOOD: Lipase: 44 U/L (ref 11–51)

## 2020-04-21 LAB — CBC
HCT: 27.1 % — ABNORMAL LOW (ref 36.0–46.0)
Hemoglobin: 7.6 g/dL — ABNORMAL LOW (ref 12.0–15.0)
MCH: 27.4 pg (ref 26.0–34.0)
MCHC: 28 g/dL — ABNORMAL LOW (ref 30.0–36.0)
MCV: 97.8 fL (ref 80.0–100.0)
Platelets: 295 10*3/uL (ref 150–400)
RBC: 2.77 MIL/uL — ABNORMAL LOW (ref 3.87–5.11)
RDW: 19.8 % — ABNORMAL HIGH (ref 11.5–15.5)
WBC: 17.5 10*3/uL — ABNORMAL HIGH (ref 4.0–10.5)
nRBC: 0 % (ref 0.0–0.2)

## 2020-04-21 LAB — HEPATITIS PANEL, ACUTE
HCV Ab: NONREACTIVE
Hep A IgM: NONREACTIVE
Hep B C IgM: NONREACTIVE
Hepatitis B Surface Ag: NONREACTIVE

## 2020-04-21 LAB — PROTIME-INR
INR: 6 (ref 0.8–1.2)
INR: 2.9 — ABNORMAL HIGH (ref 0.8–1.2)
Prothrombin Time: 52.1 seconds — ABNORMAL HIGH (ref 11.4–15.2)
Prothrombin Time: 29.7 seconds — ABNORMAL HIGH (ref 11.4–15.2)

## 2020-04-21 LAB — MAGNESIUM: Magnesium: 1.9 mg/dL (ref 1.7–2.4)

## 2020-04-21 LAB — APTT: aPTT: 37 seconds — ABNORMAL HIGH (ref 24–36)

## 2020-04-21 LAB — LACTIC ACID, PLASMA: Lactic Acid, Venous: 1.4 mmol/L (ref 0.5–1.9)

## 2020-04-21 MED ORDER — SODIUM CHLORIDE 0.9 % IV BOLUS
500.0000 mL | Freq: Once | INTRAVENOUS | Status: AC
  Administered 2020-04-21: 500 mL via INTRAVENOUS

## 2020-04-21 MED ORDER — SODIUM CHLORIDE 0.9 % IV SOLN
250.0000 mL | INTRAVENOUS | Status: DC
  Administered 2020-04-23: 250 mL via INTRAVENOUS

## 2020-04-21 MED ORDER — SODIUM CHLORIDE 0.9 % IV SOLN
60.0000 mg | Freq: Once | INTRAVENOUS | Status: AC
  Administered 2020-04-21: 60 mg via INTRAVENOUS
  Filled 2020-04-21: qty 20

## 2020-04-21 MED ORDER — PHENYLEPHRINE HCL-NACL 10-0.9 MG/250ML-% IV SOLN
25.0000 ug/min | INTRAVENOUS | Status: DC
  Administered 2020-04-21: 150 ug/min via INTRAVENOUS
  Administered 2020-04-21: 90 ug/min via INTRAVENOUS
  Administered 2020-04-21: 30 ug/min via INTRAVENOUS
  Administered 2020-04-21: 09:00:00 110 ug/min via INTRAVENOUS
  Administered 2020-04-21: 05:00:00 150 ug/min via INTRAVENOUS
  Administered 2020-04-21: 140 ug/min via INTRAVENOUS
  Administered 2020-04-21: 25 ug/min via INTRAVENOUS
  Administered 2020-04-22: 120 ug/min via INTRAVENOUS
  Filled 2020-04-21: qty 500
  Filled 2020-04-21 (×5): qty 250

## 2020-04-21 MED ORDER — MAGNESIUM SULFATE IN D5W 1-5 GM/100ML-% IV SOLN
1.0000 g | Freq: Once | INTRAVENOUS | Status: AC
  Administered 2020-04-21: 1 g via INTRAVENOUS
  Filled 2020-04-21: qty 100

## 2020-04-21 MED ORDER — ALBUMIN HUMAN 25 % IV SOLN
25.0000 g | Freq: Once | INTRAVENOUS | Status: AC
  Administered 2020-04-21: 25 g via INTRAVENOUS

## 2020-04-21 MED ORDER — SODIUM CHLORIDE 0.9% IV SOLUTION: Freq: Once | INTRAVENOUS | Status: AC

## 2020-04-21 MED ORDER — STERILE WATER FOR INJECTION IV SOLN
INTRAVENOUS | Status: AC
  Filled 2020-04-21 (×2): qty 150
  Filled 2020-04-21 (×2): qty 850
  Filled 2020-04-21: qty 150
  Filled 2020-04-21: qty 850

## 2020-04-21 NOTE — Consult Note (Addendum)
[facility] Progress Note Re-Consult   CC:  Acute pancreatitis, pancreatic mass  Deborah Figueroa is a 65 year old female with a past medical history of hypertension, hyperlipidemia and diabetes mellitus type 2. 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 She underwent incision with bone debridement and drainage per orthopedics with plans for IV antibiotics for 6 weeks. Her hospital course was further complicated by acute respiratory failure secondary to pneumonia and blood loss anemia from epistaxis.  Due to her persistent anemia with heme positive stool she was initially evaluated by our  GI service on 04/07/2020. She underwent an EGD and colonoscopy 03/31/2020 Dr. Tarri Glenn. The EGD identified a linear esophageal ulcer, and nonbleeding gastric ulcer without stigmata of bleeding, erosive gastropathy and a nonbleeding duodenal ulcer. Biopsies were negative for H. Pylori. The colonoscopy identified a fecal impaction with associated stercoral ulcer and 1 tubular adenomatous polyp was removed from the ascending colon and a polypoid polyp was removed from the descending colon. She was treated with PPI bid and Miralax. GI service signed off on 04/11/2020.   A repeat GI consult was requested by Dr. Sarajane Jews today for further evaluation regarding acute pancreatitis with a pancreatic tail mass per CT imaging to consider initiation of enteral tube feedings.  She developed intermittent nausea for the past 1 to 2 weeks. No vomiting. No abdominal pain. She underwent a CTAP on 04/20/2020 which identified acute pancreatitis with a 3 x 4 cm masslike prominence to the tail of the pancreas, a 6.2 x 2.6 cm bilobed fluid attenuating density inferior to the pancreas most likely represents a pseudocyst and a 4 x 8.4 x  3.9 cm mixed density collection inferior to the spleen, likely an area of fat necrosis or developing infarct or pseudocyst.   In review, she underwent a CT 04/14/2020 due to her acute respiratory failure which identified a left sided pleural effusion and a possible right apical lung mass. A poorly abnormal appearance of the partially visualized pancreas with a questionable pancreatic tail mass was noted.  New fluid abutting the proximal stomach was present.  A thyroid nodule was also identified. Oncology was initially consulted on 04/07/2020 to further evaluate her right lung mass in setting of recurrent coagulopathy.PT 75. INR 9.6. Not on anticoagulation. Etiology of coagulopathy is unclear. She is receiving FFP at this time. Dr. Marin Olp planned to evaluate her lung mass as an outpatient.   She developed signs of hypotension, possible developing sepsis and AKI and she was transferred to the ICU stepdown. She is on Levo gtt.   Currently, she if fatigued and weak. She complains of having nausea. She vomited bilious emesis yesterday x 1 and this morning as reported by her RN. She has LUQ "soreness". Last BM was on 04/19/2020. No rectal bleeding or melena reported by the nursing staff. She is NPO. No family at the bedside.  No family history of pancreatic cancer or autoimmune pancreatitis.  Her father had liver cancer.  Unable to obtain further history due to fatigue, patient drifting off to sleep.   Image studies:  CTAP without contrast 04/20/2020: 1. Acute pancreatitis. Correlation with pancreatic enzymes recommended. Multiple pancreatic pseudocysts as above. 2. Masslike prominence of the tail of the pancreas. Further evaluation of the pancreas with MRI without and with contrast or pancreatic protocol  CT on a nonemergent/outpatient basis recommended. 3. Multiple nodular densities extending inferiorly from the tail of the pancreas may be sequela of pancreatitis although metastasis is not excluded in this  patient with lung mass. 4. Small bilateral pleural effusions, left greater than right with bibasilar atelectasis or infiltrate. Clinical correlation and follow-up recommended. 5. No bowel obstruction. Normal appendix. 6. Aortic Atherosclerosis   Cervical MRI 04/17/2020: I do not see findings that suggest that we are dealing with metastatic disease to the spine. What was described as a mass at the right lung apex looks more like a fluid collection by MRI. Additionally, there is a right paravertebral fluid collection extending from C6-T2. There is fluid associated with the right C7-T1 facet joint, the right first costovertebral articulation, and within the C7-T1 disc space. This constellation of findings is most concerning for spinal infection. Spondylopathy of renal failure could possibly relate to these findings, but infection is my primary concern.  Thoracic MRI without contrast 04/17/2020: 1. Truncated study due to patient request. 2. Presumed fluid collection at the right first costovertebral junction with presumed separate fluid collection at the superior sulcus. As noted on prior cervical MRI, spinal infection is the primary concern. 3. 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  Chest CT without contrast 04/14/2020: 1. Evaluation is limited by lack of IV contrast. 2. There is persistent bibasilar airspace disease, with a growing left-sided pleural effusion. There is no pneumothorax. 3. Again noted is a possible right apical lung mass with a mottled appearance of the nearby C7 and T1 vertebral bodies with a possible pathologic fracture through the T1 transverse process on the right. Additionally, there is an abnormal appearance of the T7 and T8 vertebral bodies. Findings raise concern for underlying metastatic disease. A follow-up contrast enhanced cervical and thoracic spine MRI is recommended. 4. Very abnormal appearance of the partially visualized pancreas with a  questionable pancreatic tail mass. There is new fluid abutting the proximal stomach. Follow-up with a contrast enhanced CT of the abdomen is recommended for further evaluation of this finding. 5. Nonspecific left flank edema. Correlation with physical exam and contrast-enhanced CT is recommended. 6. Anemia. 7. There is a 2.7 cm left-sided thyroid nodule. Recommend thyroid US (ref: J Am Coll Radiol. 2015 Feb;12(2): 143-50). 8. There are nonspecific bilateral glenohumeral joint effusions. 9.  Aortic Atherosclerosis (ICD10-I70.0). Aortic Atherosclerosis    GI procedures:  EGD 04/11/2020: - Linear esophageal ulcer. Biopsied. - Non-bleeding gastric ulcer with no stigmata of bleeding. Biopsied. - Erosive gastropathy with no bleeding and no stigmata of recent bleeding - Non-bleeding duodenal ulcers with pigmented material. - The examination was otherwise normal.  Colonoscopy 04/08/2020: - Fecal impaction with associated sterocoral ulcer. Manual disimpaction performed today. - One 2 mm polyp in the descending colon, removed with a cold snare. Resected and retrieved. - One 3 mm polyp in the ascending colon, removed with a cold snare. Resected and retrieved. - The examination was otherwise normal on direct and retroflexion views. -Repeat colonoscopy in 1 year  -A. STOMACH, ANTRUM, BIOPSY:  - Antral mucosa with slight chronic inflammation.  - Warthin-Starry negative for Helicobacter pylori.  - No intestinal metaplasia, dysplasia or carcinoma.   B. STOMACH, BODY, BIOPSY:  - Oxyntic mucosa with slight chronic inflammation.  - No intestinal metaplasia, dysplasia or carcinoma.   C. STOMACH, FUNDUS, BIOPSY:  - Oxyntic mucosa with slight chronic inflammation.  - No intestinal metaplasia, dysplasia or carcinoma.   D. ESOPHAGUS, BIOPSY:  - Unremarkable squamous mucosa.  - No eosinophilic  esophagitis.  - No intestinal metaplasia, dysplasia or carcinoma.   E. COLON, ASCENDING,  POLYPECTOMY:  - Tubular adenoma (s).  - No high-grade dysplasia or carcinoma.   F. COLON, DESCENDING, POLYPECTOMY:  - Polypoid colonic mucosa.  - No adenomatous change or carcinoma.    Objective:  Vital signs in last 24 hours: Temp:  [96.9 F (36.1 C)-97.6 F (36.4 C)] 97.6 F (36.4 C) (02/08 0800) Pulse Rate:  [67-103] 95 (02/08 0830) Resp:  [13-23] 15 (02/08 0830) BP: (82-133)/(29-86) 124/59 (02/08 0830) SpO2:  [96 %-100 %] 97 % (02/08 0830) Weight:  [101.5 kg] 101.5 kg (02/07 1100) Last BM Date: 04/19/20 General: Critically ill appearing 65 year old female. Heart: RRR, no murmur. Pulm:  Breath sounds clear throughout, decreased in the bases. Abdomen: Soft, nondistended.  Left upper quadrant tenderness without rebound or guarding.  Hypoactive bowel sounds to all 4 quadrants.  Palpable mass. Extremities: Generalized anasarca.  Neurologic:  Alert and oriented x3.  She is profoundly weak.  Voice is softly spoken.  Moves all extremities. Psych:  Alert and cooperative. Normal mood and affect.  Intake/Output from previous day: 02/07 0701 - 02/08 0700 In: 2316.8 [I.V.:4; IV Piggyback:2312.8] Out: 900 [Urine:700; Emesis/NG output:200]  Lab Results: Recent Labs    04/20/20 0501 04/20/20 1528 04/21/20 0241  WBC 13.4*  --  17.5*  HGB 8.4*  --  7.6*  HCT 29.3*  --  27.1*  PLT 227 212 295   BMET Recent Labs    04/20/20 0501 04/20/20 1313 04/21/20 0241  NA 140 140 141  K 3.4* 3.4* 4.6  CL 107 108 110  CO2 11* 13* 10*  GLUCOSE 151* 117* 113*  BUN 60* 60* 62*  CREATININE 2.83* 2.91* 2.86*  CALCIUM 11.3* 11.3* 11.3*   LFT Recent Labs    04/21/20 0241  PROT 5.7*  ALBUMIN 1.9*  AST 13*  ALT <5  ALKPHOS 68  BILITOT 1.0   PT/INR Recent Labs    04/20/20 1528 04/21/20 0241  LABPROT 78.9* 52.1*  INR >10.0* 6.0*   Hepatitis Panel No results for input(s): HEPBSAG, HCVAB, HEPAIGM, HEPBIGM in the last 72 hours.   Assessment / Plan: 58.  65 year old female  initially admitted to the hospital 1/8 with generalized weakness, DKA and sepsis with the prolonged complicated hospital course as noted in the HPI.  Intermittent nausea x 1 to 2 weeks with vomiting (bilious emesis) x 2 days. CTAP 2/7 identified acute pancreatitis with possible pseudocyst and masslike prominence in the tail of the pancreas.  Normal LFTs and lipase levels. No prior history of pancreatitis.  Etiology of pancreatitis is unclear.  -Zofran 4 mg IV every 6 hours as needed -NPO -Defer recommendations for enteral tube feedings to Dr. Ardis Hughs -PPI IV QD  2.  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  Coagulopathy, etiology unclear.  Possibly due to sepsis. INR 6.0. Receiving FFP.  4.  Anemia.  Hg 8.4 -> 7.6. No overt signs of GI bleeding or recurrent epistaxis at this time.  5.  Right lung mass, Followed by Dr. Marin Olp   6.  Diabetes mellitus type 2, DKA resolved.  7.  AKI  8.  Tubular adenomatous colon polyps per colonoscopy 04/06/2020 -Repeat colonoscopy in 1 year  9. Esophageal ulcer, gastric ulcer and duodenal ulcer per EGD 04/09/2020: -PPI IV QD    LOS: 31 days   Deborah Figueroa  04/21/2020, 11:02 AM  ________________________________________________________________________  Velora Heckler GI MD note:  I personally examined  the patient, reviewed the data and agree with the assessment and plan described above.  I am meeting her for the first time, she clearly has a very complex situation going on. I will focus on her abnormal pancreas, peripancreatic fluid collections and other abdominal fluid collections.  This would be pretty unusual for acute pancreatitis given normal pancreatic enzymes.  CT suggested a 'mass like prominence' in the tail of pancreas and that will require further evaluation with repeat imaging +/- a biopsy procedure if solid mass is confirmed.  Ideally that would be weeks from now after she recovers from this acute illness.  Indeed trying to sample the tail of pancreas 'prominence' now would be dangerous with her known infection, sepsis picture, very elevated INR.  We reviewed her abd CT with radiology and IR. The multiple disparate abd fluid collections would be unusual for sites of bleeding.  They would be more consistent with sites of infection. Given likely infection in spine, elbow, lung I think it is certainly possibly that she has purulence in her abd as well.  IR is able to aspirate 2-3 of the fluid collections but only if INR is more corrected, sepsis/hypotension controlled. They will formally see her in consultation.  For now, continue Abx per ID, aggressively address her coagulapathy to allow safe IR aspiration. I do not recommended letting her eat or introducing a nasal feeding tube at this point.  We will follow along.  Owens Loffler, MD [facility] Pager [phone]

## 2020-04-21 NOTE — Progress Notes (Signed)
eLink Physician-Brief Progress Note Patient Name: Deborah Figueroa DOB: 05-10-1955 MRN: [id number]   Date of Service  04/21/2020  HPI/Events of Note  Patient having frequent PVCs and short runs of NSVT. K+ = 4.6.   eICU Interventions  Plan: 1. Add Mg++ level to AM blood work.      Intervention Category Major Interventions: Arrhythmia - evaluation and management  [name] 04/21/2020, 4:08 AM

## 2020-04-21 NOTE — Progress Notes (Signed)
[facility] Progress Note  Subjective: seen in ICU, pt w/o c/o's today.  CT showed more fluid collections.   Vitals:   04/21/20 1415 04/21/20 1430 04/21/20 1445 04/21/20 1452  BP: (!) 121/54 110/81 118/60 121/62  Pulse: 91 95 96 93  Resp: _0 Temp:    (!) 97.4 F (36.3 C)  TempSrc:    Oral  SpO2: 97% 98% 97% 98%  Weight:      Height:        Exam: Gen alert and in nad No jvd or bruits Chest clear bilat to bases RRR no MRG Abd soft ntnd no mass or ascites +bs GU foley w/ clear light yellow urine in good amts Ext diffuse dependent 2+ hip and UE edema, no wounds or ulcers Neuro is alert, Ox 3 , nf    Home meds:  - lipitor 40/ fenofibrate 160/ lisinopril - hctz 20-12.5 qd/ toprol xl 100 qd  - insulin glargein 10u qd/ glucotrol 2.5 qam  - zoloft 100 qd/  Robaxin 750 qid/ flexeril prn  - prn's/ vitamins/ supplements    Date                           Creat               eGFR ml/min   2008- 15                    0.7- 1.1   2016- 19                    1.1- 2.0            38- 48 stage IIIb             2020                          2.33                 22 stage IV   09/2019                       1.88                 30   10/2019                       2.06                 28           Mar 19, 2020               2.17                 23   Jan 15                       1.76                 32   Jan 20                       3.08                 16   Jan 31  2.29                 23    Feb 6                        2.65                 20    Feb 7                        2.91                 18     CXR 2/01 - IMPRESSION: Progressive left lower lobe consolidation and left effusion. Mild right lower lobe airspace disease slightly improved.    CXR 2/07 - IMPRESSION: Bibasilar atelectasis or infiltrates are noted. Probable small left pleural effusion.     I/O lastl 1 week = 6.1 L in and 6.5 L out = net even     Wt 1/10 (admit 1/08) - 93kg       Wt 2/07 today - 101 kg        BP's normal on admit and up until the last 36 hrs BP's are in the mid 80's now    Ca 11.3   BN 60  Cr 2.9  K 3.4  CO2 13  AG 19  Lipase 40          UA 2/07 - cloudy, large LE, prot 30, rare bact/ 6-10 rbc/ >50 wbcs   UNa 37, UCr 66   Renal US 03/21/20 - R 10.6cm, L 10.7 cm, no hydro either side        Assessment/ Plan: 1. AKI on CKD IV - b/l creat 1.8- 2.0, eGFR 28- 30 from 2021. Creat here mostly in 1.8- 2.6 range. Peak creat 2.9 yesterday. Main issue shock, likely sepsis. Jamas Lav may be contributing. BP's better today, UOP good and creat down to 2.8.  Will plan bisphosphonate per pharmacy choice, dc'd plan for calcitonin.   2. Hypotension - sepsis most likely, on pressor x 1. Started on IV maxipime, flagyl. Blood cx's neg. CT abd shows fluid collections. IR consulting for possible drainage.  3. Volume - up 8kg , has UE > LE edema, 3rd spacing d/t low alb +/- septic picture. Getting IVF's and tolerating, good UOP.  4. DKA - resolved 5. L olecranon osteo - per pmd 6. MSSA bacteremia/ L olecranon osteo/ cervical and thoracic osteo+ discitis - on abx per primary team 7. Poor social support - reportedly is homeless.    Rob Schertz 04/21/2020, 3:18 PM   Recent Labs  Lab 04/20/20 0501 04/20/20 1313 04/21/20 0241  K 3.4* 3.4* 4.6  BUN 60* 60* 62*  CREATININE 2.83* 2.91* 2.86*  CALCIUM 11.3* 11.3* 11.3*  HGB 8.4*  --  7.6*   Inpatient medications: . (feeding supplement) PROSource Plus  30 mL Oral TID BM  . Chlorhexidine Gluconate Cloth  6 each Topical Daily  . hydrocortisone sod succinate (SOLU-CORTEF) inj  50 mg Intravenous Q6H  . insulin aspart  0-15 Units Subcutaneous Q4H  . mouth rinse  15 mL Mouth Rinse BID  . nystatin  5 mL Oral QID  . nystatin   Topical TID  . pantoprazole (PROTONIX) IV  40 mg Intravenous Q12H  . phytonadione  10 mg Subcutaneous Daily  . sertraline  100 mg Oral Daily  . sodium bicarbonate  1,300 mg Oral QID   .  sodium chloride     . ceFEPime (MAXIPIME) IV Stopped (04/20/20 1540)  . metronidazole 500 mg (04/21/20 1458)  . phenylephrine (NEO-SYNEPHRINE) Adult infusion 10 mcg/min (04/21/20 1349)  .  sodium bicarbonate (isotonic) infusion in sterile water 125 mL/hr at 04/21/20 1349   acetaminophen, dextrose, liver oil-zinc oxide, ondansetron **OR** ondansetron (ZOFRAN) IV, oxymetazoline, phenol, prochlorperazine, traZODone

## 2020-04-21 NOTE — Progress Notes (Signed)
Spoke with Primary RN re: PICC order, PICC will be done tom 2/9. OK with Dr. Stephanie Coup. Awaiting for ID response. Currently have 2 working PIVs.

## 2020-04-21 NOTE — Progress Notes (Signed)
[facility] Progress Note Patient Name: Deborah Figueroa DOB: [birth year] MRN: [id number]   Date of Service  04/21/2020  HPI/Events of Note  Mutiple issues: 1. Mg++ = 1.9 and Creatinine = 2.86. 2. INR = 6.0. 3. Anemia - Hgb = 7.6. Transfuse for Hgb < 7.0. 4. Bladder scan with high residual.   eICU Interventions  Plan: 1. Replace Mg++. 2. Transfuse 3 units FFP. 3. Repeat PT/INR at 12 noon.  4. I/O cath PRN.        Allyn Bertoni Cornelia Copa 04/21/2020, 5:34 AM

## 2020-04-21 NOTE — Progress Notes (Signed)
PT Cancellation Note  Patient Details Name: Deborah Figueroa MRN: [id number] DOB: [birth year]   Cancelled Treatment:    Reason Eval/Treat Not Completed: Medical issues which prohibited therapy, moved to  ICU.   Claretha Cooper 04/21/2020, 9:36 AM [place] Pager [phone] Office [phone]

## 2020-04-21 NOTE — Progress Notes (Addendum)
NAME:  Deborah Figueroa, MRN:  [id number], DOB:  January 26, 1956, LOS: 46 ADMISSION DATE:  03/20/2020, CONSULTATION DATE: 2/7 REFERRING MD:  Dr. Sarajane Jews, CHIEF COMPLAINT:  Hypotension   Brief History:  65 y/o F admitted 1/8 with   History of Present Illness:  65 y/o, homeless F, who presented to [facility] on 1/8 with reports of weakness, falls, increased urination and thirst.  She was found to have DKA on presentation and was started on insulin / IVF and admitted per [facility].  She had a leukocytosis on presentation and left elbow pain which led to identification of sepsis secondary to MSSA bacteremia.  Imaging identified left olecranon bursitis and osteomyelitis.  She underwent I&D on 1/10 with fluid / tissue from the L olecranon grew MSSA. Repeat cultures subsequently cleared.  Plan is to complete 6 weeks of IV antibiotics (likely inpatient due to homelessness / lack of insurance). She developed a normocytic normochromic anemia with a positive FOBT, epistaxis and GI was consulted as well as ONC.  She had an EGD + colonoscopy on 1/27 which showed stercoral ulcer and gastric ulcer.  ONC felt her anemia was in the setting of sepsis.  She was noted to have coagulopathy and treated with vitamin K.  She developed hypoxemic respiratory distress in the setting of PNA.  She has had extensive radiographic evaluation which has raised concern for RUL pulmonary mass, enlarging left pleural effusion, thyroid nodule, & possible pathologic fractures of spine.  She developed worsening AKI, AGMA & hypotension prompting PCCM consultation on 2/7.  Past Medical History:  Homelessness  Seasonal Allergies  Depression  Cellulitis  HTN HLD DM  Significant Hospital Events:  1/08 Admit  1/10 ID of left arm per Ortho  1/27 EGD + Colonoscopy  2/07 PCCM consulted   Consults:  ONC  GI  Ortho  ID [place] PCCM Nephrology  Procedures:    Significant Diagnostic Tests:   CT Head 1/6 >> negative for acute intracranial  abnormality, forehead periorbital hematoma  Renal US 1/8 >> negative   CT L Humerus / Forearm 1/10 >> osteomyelitis of the olecranon with adjacent inflammatory phlegmon and abscess in the region of the olecranon bursa, evidence of pyomyositis in the flexor digitorum superficialis muscle of the proximal forearm, small abscess along the ventral aspect of the lower bisceps  MRI Brain 1/9 >> no evidence of recent infarction   MRI L Elbow 1/9 >> limited study, osteomyelitis of the olecranon and proximal ulna, moderate joint effusion concerning for septic arthritis, proximal forearm flexor compartment intramuscular fluid collection concerning for abscess, additional incompletely visualized small fluid collections in the distal upper arm   ECHO 1/11 >> LVEF 65-70%, LV with hyperdynamic function, mild LVH, RV systolic function normal,   TEE 1/11 >> no evidence of vegetation / infective endocarditis   CT Chest 1/17 >> right apical lung mass 2.7 x 2.3 x2 cm concerning for neoplasm potentially with chest wall invasion, bilateral lower lobe infiltrates c/w atelectasis and pneumonia.  Ill defined peripancreatic tissue planes raising question of pancreatitis at the tail, left thyroid nodule 2.2 x 1.9 cm.    [place] ABD Limited 1/23 >> cholelithiasis with contracted gallbladder around at least a single gallstone, normal sonographic appearance of the liver  CT Chest w/o 2/1 >> limited evaluation with non-contrasted study, persistent bibasilar airspace disease with increasing left effusion, possible right apical lung mass with mottled appearance of C7, T1 vertebral bodies with a possible pathologic fracture through the T1 transverse process on the right.  Abnormal appearance of T7, T8 vertebral bodies.  Very abnormal appearance of the partially visualized pancreas with questionable pancreatic tail mass, new fluid abutting the proximal stomach, non-specific left flank edema, 2.7cm left sided thyroid nodule, non-specific  bilateral glenohumeral joint effusions.  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 Correlation with pancreatic enzymes recommended. Multiple pancreatic pseudocysts as above. Masslike prominence of the tail of the pancreas. Further evaluation of the pancreas with MRI without and with contrast or pancreatic protocol CT on a nonemergent/outpatient basis recommended. Multiple nodular densities extending inferiorly from the tail of the pancreas may be sequela of pancreatitis although metastasis is not excluded in this patient with lung mass. Small bilateral pleural effusions, left greater than right with bibasilar atelectasis or infiltrate. Clinical correlation and follow-up recommended. No bowel obstruction. Normal appendix. Aortic Atherosclerosis (ICD10-I70.0).   Micro Data:  COVID 1/8 >> negative  HIV 1/8 >> negative  BCID 1/8 >> staph detected  MRSA PCR 1/10 >> positive  BCx2 1/8 >> MSSA L Olecranon 1/10 >> MSSA  UC 1/17 >> greater than 100k yeast  BCx2 1/16 >> negative  MRSA PCR 2/7 >> negative   Antimicrobials:  Cefazolin 1/9 >> 1/23  Nafcillin 1/9 >> 1/10  Cefepime 1/18 >>  Diflucan 2/4 >> 2/7 Flagyl 2/7 >>  Interim History / Subjective:  Neo started overnight for hypotension and decrease in UO  received 25% albumin 25 gm overnight INR of 6, received 3 units FFP overnight Mag 1.9, repleted overnight Increase in PVC's noted Net + 19 L 700 ml UOP in last 24 hours  On RA with sats of 98% She is easily aroused and appropriate, but drowsy this morning.( I woke her up from sleep CO2 of 10 ( from 13 prior to Na Bicarb repletion) Lactate 1.4 Platelets 295 HGB 7.6 from 8.4, ? hemo dilutional, no obvious bleeding  Gap 21    Objective   Blood pressure (!) 118/56, pulse  94, temperature 97.6 F (36.4 C), temperature source Axillary, resp. rate 19, height '5\' 11"'  (1.803 m), weight 101.5 kg, SpO2 97 %.        Intake/Output Summary (Last 24 hours) at 04/21/2020 0830 Last data filed at 04/21/2020 8588 Gross per 24 hour  Intake 4861.59 ml  Output 800 ml  Net 4061.59 ml   Filed Weights   03/17/2020 0952 04/04/2020 1400 04/20/20 1100  Weight: 93.9 kg 93.9 kg 101.5 kg    Examination: General: chronically ill appearing adult female lying in bed in NAD HEENT: MM pink/dry, missing teeth, anicteric, bruising to right eye Neuro: AAOx4, speech clear, MAE, drowsy  CV: s1s2 RRR, no m/r/g, few PVC's per tele PULM: Bilateral chest excursion, non-labored at rest, lungs bilaterally clear anterior, diminished bases with crackles  GI: soft, bsx4 active  Extremities: warm/dry, 1-2 + generalized edema  Skin: petechiae to left arm >R, left elbow with mild swelling / bruising, no warmth or streaking    Resolved Hospital Problem list      Assessment & Plan:   Severe Sepsis in setting of left Olecranon Osteomyelitis with subsequent MSSA Bacteremia  Hypotension >> now on Neo ( 2/8) Rule Out Adrenal Insufficiency Suspect constellation of findings related to down stream effects of sepsis / MSSA bacteremia in the setting of poorly controlled DM. She is clinically asymptomatic  despite hypotension.  At baseline, takes metoprolol for HTN. ? Component of relative adrenal insufficiency (despite prior assessment).  -add stress dose steroids -continue cefepime  -follow cultures  -appreciate ID  - pressors started  for decreased UO 2/8 , MAP goal 65 mm Hg  Acute Hypoxemic Respiratory Failure  Small effusions per CT abd pelvis 2/7 PNA  Bibasilar infiltrates / consolidation with air bronchograms on imaging.  -pulmonary hygiene -IS, mobilize  -resolved, off O2 - trend  CXR to eval effusions  AKI  AG / NG Metabolic Acidosis Hypercalcemia ( 11.3)  No improvement after Na Bicarb  replacement BUN / sr Cr ratio 20.6 suggestive of pre-renal, dry mucus membranes on exam.  Sodium bicarbonate does not correct to normal, suggestive of component of non-gap acidosis.   -increase sodium bicarbonate to QID, x3 days -Trend BMP / urinary output -Replace electrolytes as indicated -Avoid nephrotoxic agents, ensure adequate renal perfusion - Appreciate renal assist ( Tx with calcitonin, Ca remains stable at 11.3 overnight)  Normocytic Normochromic Anemia  Ulcer identified on EGD -trend CBC  -transfuse for Hgb <7% -PPI BID   Coagulopathy Required 3 units FFP overnight  INR 9.6 2/7, not on anticoagulation  -assess DIC panel >> results noted >  - will have repeat INR at noon , may need further vitamin K / FFP - INR in am - Appreciate Heme Onc assist -SCD's for DVT prophylaxis   Gastric Ulcer  Biopsy negative  -PPI as above  -diet as tolerated  Hypercalcemia  Hypokalemia  - Calcitonin x 48 hours per renal - Can consider Bi phosphonate as needed  - Maintain Mag > 2 - monitor, replace as indicated   Poorly Controlled DM  Hgb A1c 10.4 on admit.  -SSI, moderate scale  -anticipate rise in glucose with steroid administration  -hold home agents   Pancreatitis per CT 2/7 Asymptomatic Plan - Trend Lipase and Amylase - Treat nausea prn  Right Apical Lung Mass Never -smoker, but second hand smoke as a child DDx includes infectious / inflammatory etiology vs malignancy given other imaging findings -will need to treat as infection and follow up repeat imaging 2-4 weeks post completion -will need biopsy of mass  Questionable C7, T1 Vertebral Body Pathologic Fracture ? Infectious vs malignant etiology  -will need follow up imaging vs biopsy  Thyroid Nodule  2.7 cm left thyroid nodule -will ultimately need biopsy  Hx HTN, HLD -hold home antihypertensives   Questionable Rectovaginal Fistula -will need repeat imaging at some point -abd exam benign   Best  practice (evaluated daily)  Diet: clear liquid diet  Pain/Anxiety/Delirium protocol (if indicated): n/a  VAP protocol (if indicated): n/a  DVT prophylaxis: SCD's GI prophylaxis: PPI  Glucose control: SSI  Mobility: as tolerated  Disposition: SDU   Goals of Care:  Last date of multidisciplinary goals of care discussion: pending Family and staff present:  Summary of discussion: pending  Follow up goals of care discussion due: 2/14  Code Status: Full Code. Patient reports her son would make decisions for her if she could not. She has 3 adult children. Son was contacted by primary service 2/7 and has been updated on her condition.  Son has come in to visit his mother today.( 2/8)  Labs   CBC: Recent Labs  Lab 04/15/20 0608 04/20/20 0501 04/20/20 1528 04/21/20 0241  WBC 11.6* 13.4*  --  17.5*  NEUTROABS 10.2*  --   --   --   HGB 8.3* 8.4*  --  7.6*  HCT 26.0* 29.3*  --  27.1*  MCV 87.8 96.4  --  97.8  PLT 180 227 212 163    Basic Metabolic Panel: Recent Labs  Lab 04/15/20 0608 04/16/20 0506 04/17/20 0511 04/18/20 0605 04/19/20 0622 04/20/20 0501 04/20/20 1313 04/21/20 0241  NA 140   < > 139 140 140 140 140 141  K 3.2*   < > 2.8* 3.6 3.6 3.4* 3.4* 4.6  CL 109   < > 108 111 110 107 108 110  CO2 17*   < > 16* 15* 16* 11* 13* 10*  GLUCOSE 90   < > 119* 142* 105* 151* 117* 113*  BUN 46*   < > 49* 52* 58* 60* 60* 62*  CREATININE 2.31*   < > 2.44* 2.06* 2.65* 2.83* 2.91* 2.86*  CALCIUM 9.9   < > 10.1 10.6* 11.1* 11.3* 11.3* 11.3*  MG 1.7  --  2.0 2.0  --   --   --  1.9   < > = values in this interval not displayed.   GFR: Estimated Creatinine Clearance: 26.1 mL/min (A) (by C-G formula based on SCr of 2.86 mg/dL (H)). Recent Labs  Lab 04/15/20 0608 04/16/20 0506 04/20/20 0501 04/20/20 1020 04/20/20 1313 04/21/20 0241  PROCALCITON 0.32 0.29  --   --   --   --   WBC 11.6*  --  13.4*  --   --  17.5*  LATICACIDVEN  --   --   --  2.0* 1.7 1.4    Liver Function  Tests: Recent Labs  Lab 04/16/20 0506 04/18/20 0605 04/19/20 0622 04/20/20 0501 04/21/20 0241  AST 10* 10* 10* 9* 13*  ALT <5 <5 <5 <5 <5  ALKPHOS 72 73 77 77 68  BILITOT 0.7 0.6 0.4 0.7 1.0  PROT 6.0* 6.2* 6.3* 6.2* 5.7*  ALBUMIN 1.5* 1.5* 1.6* 1.6* 1.9*   Recent Labs  Lab 04/18/20 0605 04/20/20 1313 04/21/20 0241  LIPASE 40 40 44   No results for input(s): AMMONIA in the last 168 hours.  ABG    Component Value Date/Time   HCO3 14.6 (L) 04/06/2020 1413   TCO2 29 08/16/2007 2046   ACIDBASEDEF 11.6 (H) 04/01/2020 1413   O2SAT 86.3 04/06/2020 1413     Coagulation Profile: Recent Labs  Lab 04/20/20 0930 04/20/20 1528 04/21/20 0241  INR 9.6* >10.0* 6.0*    Cardiac Enzymes: No results for input(s): CKTOTAL, CKMB, CKMBINDEX, TROPONINI in the last 168 hours.  HbA1C: Hgb A1c MFr Bld  Date/Time Value Ref Range Status  03/19/2020 11:11 AM 10.4 (H) 4.6 - 6.5 % Final    Comment:    Glycemic Control Guidelines for People with Diabetes:Non Diabetic:  <6%Goal of Therapy: <7%Additional Action Suggested:  >8%   09/13/2019 03:57 PM 6.6 (H) <5.7 % of total Hgb Final    Comment:    For someone without known diabetes, a hemoglobin A1c value of 6.5% or greater indicates that they may have  diabetes and this should be confirmed with a follow-up  test. . For someone with known diabetes, a value <7% indicates  that their diabetes is well controlled and a value  greater than or equal to 7% indicates suboptimal  control. A1c targets should be individualized based on  duration of diabetes, age, comorbid conditions, and  other considerations. . Currently, no consensus exists regarding use of hemoglobin A1c for diagnosis of diabetes for children. .     CBG: Recent Labs  Lab 04/20/20 1525  04/20/20 2015 04/21/20 0013 04/21/20 0428 04/21/20 0722  GLUCAP 111* 125* 107* 130* 105*      Social History:   reports that she has never smoked. She has never used smokeless  tobacco. She reports that she does not drink alcohol and does not use drugs.   Family History:  Her family history includes Cancer in her maternal aunt and maternal grandmother; Cancer (age of onset: 44) in her father; Depression in her father; Diabetes in her brother; Hyperlipidemia in her mother; Hypertension in her mother; Stroke in her mother.   Allergies No Known Allergies   Home Medications  Prior to Admission medications   Medication Sig Start Date End Date Taking? Authorizing Provider  atorvastatin (LIPITOR) 40 MG tablet Take 1 tablet (40 mg total) by mouth daily. 03/19/20  Yes Roma Schanz R, DO  ceFAZolin (ANCEF) IVPB Inject 2 g into the vein every 8 (eight) hours. Indication:  MSSA bacteremia w/ associated olecranon bursitis   First Dose: Yes Last Day of Therapy:  05/05/2020 Labs - Once weekly:  CBC/D and BMP, Labs - Every other week:  ESR and CRP Method of administration: IV Push Method of administration may be changed at the discretion of home infusion pharmacist based upon assessment of the patient and/or caregiver's ability to self-administer the medication ordered. 04/06/20 05/16/20 Yes Mercy Riding, MD  fenofibrate 160 MG tablet Take 1 tablet (160 mg total) by mouth daily. 03/19/20  Yes Roma Schanz R, DO  glipiZIDE (GLUCOTROL) 5 MG tablet Take 0.5 tablets (2.5 mg total) by mouth daily before breakfast. 11/25/19  Yes Shamleffer, Melanie Crazier, MD  Insulin Pen Needle 31G X 8 MM MISC 1 daily 03/19/20  Yes Carollee Herter, Kendrick Fries R, DO  Lancets [facility] ULTRASOFT) lancets Use as instructed 10/02/17  Yes Roma Schanz R, DO  lisinopril-hydrochlorothiazide (ZESTORETIC) 20-12.5 MG tablet Take 2 tablets by mouth daily. 03/19/20  Yes Ann Held, DO  methocarbamol (ROBAXIN-750) 750 MG tablet Take 1 tablet (750 mg total) by mouth 4 (four) times daily. 03/19/20  Yes Roma Schanz R, DO  metoprolol succinate (TOPROL-XL) 100 MG 24 hr tablet Take with or immediately  following a meal. 03/19/20  Yes Roma Schanz R, DO  sertraline (ZOLOFT) 100 MG tablet Take 1 tablet (100 mg total) by mouth daily. 03/19/20  Yes Ann Held, DO  cyclobenzaprine (FLEXERIL) 5 MG tablet Take 0.5-1 tablets (2.5-5 mg total) by mouth 3 (three) times daily as needed for muscle spasms. DO NOT DRINK ALCOHOL OR DRIVE WHILE TAKING THIS MEDICATION 02/29/20   Volney American, PA-C  glucose blood [facility] VERIO) test strip USE AS DIRECTED TO CHECK BLOOD SUGAR ONCE DAILY Patient not taking: Reported on 03/17/2020 07/31/19   Carollee Herter, Alferd Apa, DO  insulin glargine (LANTUS SOLOSTAR) 100 UNIT/ML Solostar Pen Inject 10 Units into the skin daily. Patient not taking: Reported on 03/15/2020 03/19/20   Ann Held, DO     Critical care time: 11 minutes     Magdalen Spatz, MSN, AGACNP-BC [place] for personal pager 04/21/2020, 8:30 AM    Attending note: I have seen and examined the patient. History, labs and imaging reviewed.  65 year old with complicated past medical history of hypertension, hyperlipidemia, diabetes presenting with this, DKA, MSSA bacteremia from elbow infection, osteomyelitis.  Course complicated by GI bleed, pulmonary mass suspicious for abscess, pathologic fracture of spine, AKI.    Now has developed signs of  pancreatitis with SIRS picture.  Started on low-dose Neo-Synephrine to peripheral overnight  Blood pressure 121/62, pulse 93, temperature (!) 97.4 F (36.3 C), temperature source Oral, resp. rate 19, height '5\' 11"'  (1.803 m), weight 101.5 kg, SpO2 98 %. Gen:      No acute distress, chronically ill-appearing HEENT:  EOMI, sclera anicteric Neck:     No masses; no thyromegaly Lungs:    Clear to auscultation bilaterally; normal respiratory effort CV:         Regular rate and rhythm; no murmurs Abd:      + bowel sounds; soft, non-tender; no palpable masses, no distension Ext:    No edema; adequate  peripheral perfusion Skin:      Warm and dry; no rash Neuro: Somnolent, arousable  Labs/Imaging personally reviewed, significant for BUN/creatinine 62/2.86, WBC 17.5, hemoglobin 7.6, platelets 295 No new imaging  Assessment/plan: Severe sepsis, in the setting of osteomyelitis, MSSA bacteremia Now with findings of pancreatitis, abdominal fluid collection concerning for intra-abdominal infection Continue cefepime, Flagyl Wean down pressors Continue stress dose steroids Additional IV fluids IR to sample the abdominal fluid collection for cultures  AKI, anion gap and nongap acidosis Started IV bicarb Follow urine output and creatinine    Right apical mass Suspected to be fluid collection, focus of infection associated with spinous osteomyelitis Follow-up imaging after treatment with antibiotics    Elevated INR.  No evidence of DIC Corrected after FFP, vitamin K Monitor lab  The patient is critically ill with multiple organ systems failure and requires high complexity decision making for assessment and support, frequent evaluation and titration of therapies, application of advanced monitoring technologies and extensive interpretation of multiple databases.  Critical care time - 35 mins. This represents my time independent of the NPs time taking care of the pt.  [name] MD [facility] 04/21/2020, 3:07 PM

## 2020-04-21 NOTE — TOC Progression Note (Signed)
Transition of Care [facility]) - Progression Note    Patient Details  Name: Deborah Figueroa MRN: [id number] Date of Birth: 1955-11-02  Transition of Care [facility] CM/SW Contact  Leeroy Cha, RN Phone Number: 04/21/2020, 8:13 AM  Clinical Narrative:    Notes from [id number]: 65 year old with complicated past medical history of hypertension, hyperlipidemia, diabetes presenting with this, DKA, MSSA bacteremia from elbow infection, osteomyelitis.  Course complicated by GI bleed, pulmonary mass suspicious for abscess, pathologic fracture of spine, AKI.  She has become more hypotensive today and transfer to stepdown.  PCCM consulted for help with management  PLAN: too ill to determine will depend on [place] findings.  Expected Discharge Plan: Skilled Nursing Facility Barriers to Discharge: Continued Medical Work up  Expected Discharge Plan and Services Expected Discharge Plan: [place]   Discharge Planning Services: CM Consult                                           Social Determinants of Health (SDOH) Interventions    Readmission Risk Interventions No flowsheet data found.

## 2020-04-21 NOTE — Progress Notes (Signed)
INFECTIOUS DISEASE PROGRESS NOTE  ID: Deborah Figueroa is a 65 y.o. female with  Principal Problem:   MSSA bacteremia Active Problems:   Hypercalcemia   Depression, major, single episode, moderate (HCC)   DKA (diabetic ketoacidosis) (HCC)   Acute pain of right shoulder   Left elbow pain   Acute kidney injury ([place])   Weakness   Recurrent falls   Sepsis due to methicillin susceptible Staphylococcus aureus ([place])   Osteomyelitis of left elbow (HCC)   Osteomyelitis of left ulna (HCC)   Sepsis ([place])   Blood in the stool   Anemia due to GI blood loss   Coagulopathy (HCC)   Duodenal ulcer   Acute gastric ulcer without hemorrhage or perforation   Adenomatous polyp of ascending colon   Adenomatous polyp of descending colon   Stercoral ulcer of rectum   Fecal impaction (HCC)   Lung mass   Thyroid nodule   Pancreatic mass   Aortic atherosclerosis (HCC)   Discitis   Vertebral osteomyelitis (HCC)   Lung abscess (HCC)  Subjective: Minimal response, pressor tapered though not off.   Abtx:  Anti-infectives (From admission, onward)   Start     Dose/Rate Route Frequency Ordered Stop   04/20/20 2200  metroNIDAZOLE (FLAGYL) IVPB 500 mg        500 mg 100 mL/hr over 60 Minutes Intravenous Every 8 hours 04/20/20 1432     04/20/20 1530  ceFEPIme (MAXIPIME) 2 g in sodium chloride 0.9 % 100 mL IVPB        2 g 200 mL/hr over 30 Minutes Intravenous Every 24 hours 04/20/20 1434     04/20/20 1500  piperacillin-tazobactam (ZOSYN) IVPB 3.375 g  Status:  Discontinued        3.375 g 12.5 mL/hr over 240 Minutes Intravenous Every 8 hours 04/20/20 1423 04/20/20 1429   04/20/20 1400  metroNIDAZOLE (FLAGYL) IVPB 500 mg  Status:  Discontinued        500 mg 100 mL/hr over 60 Minutes Intravenous Every 8 hours 04/20/20 1313 04/20/20 1421   04/20/20 1400  ceFEPIme (MAXIPIME) 2 g in sodium chloride 0.9 % 100 mL IVPB  Status:  Discontinued        2 g 200 mL/hr over 30 Minutes Intravenous Every 24 hours  04/20/20 1330 04/20/20 1356   04/19/20 2200  ceFAZolin (ANCEF) IVPB 2g/100 mL premix  Status:  Discontinued        2 g 200 mL/hr over 30 Minutes Intravenous Every 12 hours 04/19/20 1501 04/19/20 1513   04/19/20 1600  ceFAZolin (ANCEF) IVPB 2g/100 mL premix  Status:  Discontinued        2 g 200 mL/hr over 30 Minutes Intravenous Every 8 hours 04/19/20 1513 04/20/20 1316   04/17/20 1000  fluconazole (DIFLUCAN) IVPB 100 mg        100 mg 50 mL/hr over 60 Minutes Intravenous Every 24 hours 04/16/20 1229 04/20/20 1215   03/25/2020 1400  ceFAZolin (ANCEF) IVPB 2g/100 mL premix  Status:  Discontinued        2 g 200 mL/hr over 30 Minutes Intravenous Every 8 hours 04/02/2020 0735 04/19/20 1501   04/09/20 1300  fluconazole (DIFLUCAN) tablet 100 mg  Status:  Discontinued        100 mg Oral Daily 04/09/20 1222 04/16/20 1227   04/06/20 0000  ceFAZolin (ANCEF) IVPB        2 g Intravenous Every 8 hours 04/06/20 1604 05/16/20 2359   04/05/20 1800  ceFAZolin (  ANCEF) IVPB 2g/100 mL premix  Status:  Discontinued        2 g 200 mL/hr over 30 Minutes Intravenous Every 12 hours 04/05/20 1345 03/29/2020 0735   04/05/20 1400  ceFAZolin (ANCEF) IVPB 2g/100 mL premix  Status:  Discontinued        2 g 200 mL/hr over 30 Minutes Intravenous Every 8 hours 04/05/20 1301 04/05/20 1345   04/02/20 0522  ceFEPIme (MAXIPIME) 2 g in sodium chloride 0.9 % 100 mL IVPB  Status:  Discontinued        2 g 200 mL/hr over 30 Minutes Intravenous Every 24 hours 04/01/20 0932 04/05/20 1215   03/31/20 1800  ceFEPIme (MAXIPIME) 2 g in sodium chloride 0.9 % 100 mL IVPB  Status:  Discontinued        2 g 200 mL/hr over 30 Minutes Intravenous Every 12 hours 03/31/20 1244 04/01/20 0932   03/30/20 1030  piperacillin-tazobactam (ZOSYN) IVPB 3.375 g  Status:  Discontinued        3.375 g 12.5 mL/hr over 240 Minutes Intravenous Every 8 hours 03/30/20 1006 03/31/20 1236   03/29/20 1300  fluconazole (DIFLUCAN) tablet 150 mg  Status:  Discontinued         150 mg Oral  Once 03/29/20 1201 04/09/20 1222   04/01/2020 1145  ceFAZolin (ANCEF) IVPB 2g/100 mL premix  Status:  Discontinued        2 g 200 mL/hr over 30 Minutes Intravenous Every 8 hours 03/30/2020 1055 03/30/20 0951   03/19/2020 1100  nafcillin 12 g in sodium chloride 0.9 % 500 mL continuous infusion  Status:  Discontinued        12 g 20.8 mL/hr over 24 Hours Intravenous Every 24 hours 03/18/2020 0949 03/22/2020 1055   03/22/20 1500  nafcillin 2 g in sodium chloride 0.9 % 100 mL IVPB  Status:  Discontinued        2 g 200 mL/hr over 30 Minutes Intravenous Every 4 hours 03/22/20 1354 03/22/2020 0949   03/22/20 1345  nafcillin injection 2 g  Status:  Discontinued        2 g Intravenous Every 4 hours 03/22/20 1344 03/22/20 1354   03/22/20 1300  ceFAZolin (ANCEF) IVPB 2g/100 mL premix  Status:  Discontinued        2 g 200 mL/hr over 30 Minutes Intravenous Every 8 hours 03/22/20 1257 03/22/20 1344      Medications:  Continuous: . sodium chloride    . ceFEPime (MAXIPIME) IV Stopped (04/20/20 1540)  . metronidazole Stopped (04/21/20 6967)  . phenylephrine (NEO-SYNEPHRINE) Adult infusion 10 mcg/min (04/21/20 1349)  .  sodium bicarbonate (isotonic) infusion in sterile water 125 mL/hr at 04/21/20 1349  . sodium chloride      Objective: Vital signs in last 24 hours: Temp:  [96.9 F (36.1 C)-97.9 F (36.6 C)] 97.6 F (36.4 C) (02/08 1340) Pulse Rate:  [70-103] 94 (02/08 1340) Resp:  [13-25] 17 (02/08 1340) BP: (84-138)/(29-86) 134/62 (02/08 1340) SpO2:  [96 %-100 %] 98 % (02/08 1340)   General appearance: no distress Resp: clear to auscultation bilaterally Cardio: regular rate and rhythm GI: normal findings: bowel sounds normal and soft, tenderness with palpation, midline Extremities: edema anasarca  Lab Results Recent Labs    04/20/20 0501 04/20/20 1313 04/21/20 0241  WBC 13.4*  --  17.5*  HGB 8.4*  --  7.6*  HCT 29.3*  --  27.1*  NA 140 140 141  K 3.4* 3.4* 4.6  CL 107 108  110  CO2 11* 13* 10*  BUN 60* 60* 62*  CREATININE 2.83* 2.91* 2.86*   Liver Panel Recent Labs    04/20/20 0501 04/21/20 0241  PROT 6.2* 5.7*  ALBUMIN 1.6* 1.9*  AST 9* 13*  ALT <5 <5  ALKPHOS 77 68  BILITOT 0.7 1.0   Sedimentation Rate No results for input(s): ESRSEDRATE in the last 72 hours. C-Reactive Protein No results for input(s): CRP in the last 72 hours.  Microbiology: Recent Results (from the past 240 hour(s))  Culture, blood (x 2)     Status: None (Preliminary result)   Collection Time: 04/20/20  9:17 AM   Specimen: BLOOD  Result Value Ref Range Status   Specimen Description   Final    BLOOD RIGHT ANTECUBITAL Performed at [facility] [address] [place], [place] [address]    Special Requests   Final    BOTTLES DRAWN AEROBIC ONLY Blood Culture adequate volume Performed at [facility] [address] [place], [place] [address]    Culture   Final    NO GROWTH < 24 HOURS Performed at [facility] [address] [place], [place] [address]    Report Status PENDING  Incomplete  MRSA PCR Screening     Status: None   Collection Time: 04/20/20 11:06 AM   Specimen: Nasal Mucosa; Nasopharyngeal  Result Value Ref Range Status   MRSA by PCR NEGATIVE NEGATIVE Final    Comment:        The GeneXpert MRSA Assay (FDA approved for NASAL specimens only), is one component of a comprehensive MRSA colonization surveillance program. It is not intended to diagnose MRSA infection nor to guide or monitor treatment for MRSA infections. Performed at [facility], [place] [address], [place], [place] [address]   Culture, blood (x 2)     Status: None (Preliminary result)   Collection Time: 04/20/20  1:13 PM   Specimen: BLOOD LEFT HAND  Result Value Ref Range Status   Specimen Description   Final    BLOOD LEFT HAND Performed at [facility] [address], [place], [place] [address]     Special Requests   Final    BOTTLES DRAWN AEROBIC ONLY Blood Culture adequate volume Performed at [facility] [address] [place], [place] [address]    Culture   Final    NO GROWTH < 24 HOURS Performed at [facility] [address], [place], [place] [address]    Report Status PENDING  Incomplete    Studies/Results: CT ABDOMEN PELVIS WO CONTRAST  Result Date: 04/20/2020 CLINICAL DATA:  65 year old female with nausea vomiting. EXAM: CT ABDOMEN AND PELVIS WITHOUT CONTRAST TECHNIQUE: Multidetector CT imaging of the abdomen and pelvis was performed following the standard protocol without IV contrast. COMPARISON:  Abdominal radiograph dated 04/20/2020. FINDINGS: Evaluation of this exam is limited in the absence of intravenous contrast. Lower chest: Small bilateral pleural effusions, left greater than right with associated partial consolidative changes of the lower lobes with air bronchogram which may represent atelectasis or infiltrate. Clinical correlation and follow-up recommended. Coronary vascular calcification. No intra-abdominal free air.  Trace free fluid in the pelvis. Hepatobiliary: The liver is unremarkable. No intrahepatic biliary ductal dilatation. Multiple gallstones. No pericholecystic fluid or evidence of acute cholecystitis by CT Pancreas: There is nodular stranding surrounding the pancreas most consistent with acute pancreatitis. Correlation with pancreatic enzymes recommended. There is a 3 x 4 cm masslike  prominence of the tail of the pancreas (29/2) which is not well visualized or evaluated on this noncontrast CT. Further evaluation of the pancreas with MRI without and with contrast or pancreatic protocol CT on a nonemergent/outpatient basis recommended. A 6.2 x 2.6 cm bilobed fluid attenuating density inferior to the pancreas most likely represents a pseudocyst. Additionally there is a 4.0 x 8.4 x 3.9 cm mixed density collection inferior to the spleen,  likely an area of fat necrosis or developing infarct or pseudocyst. Scattered nodularity extending inferiorly from the tail of the pancreas may be sequela of pancreatitis although metastasis is not excluded in this patient with lung mass. Several nodular densities anterior to the distal left psoas muscle measuring up to 3.5 x 3.5 cm (64/2). There is a 5.0 x 3.5 cm cystic structure in the left lower abdomen (52/2) as well as a 5.8 x 3.0 cm low attenuating, fluid anterior to the distal psoas muscle (56/2) are not characterized but possibly pseudocysts. Spleen: The spleen is unremarkable. Adrenals/Urinary Tract: The adrenal glands unremarkable. There is no hydronephrosis or nephrolithiasis on either side. There is a 15 mm left renal interpolar hypodense lesion which is not well characterized but may represent a cyst. Focus of scarring and calcification in the inferior pole of the left kidney. The visualized ureters and urinary bladder appear unremarkable. Stomach/Bowel: There is no bowel obstruction or active inflammation. The appendix is normal. Vascular/Lymphatic: Moderate aortoiliac atherosclerotic disease. The aorta is ectatic measuring up to 2.2 cm. The IVC is unremarkable. No portal venous gas. No adenopathy. Reproductive: The uterus is retroflexed.  No adnexal masses. Other: Diffuse subcutaneous edema.  No fluid collection. Musculoskeletal: Degenerative changes of the spine. L4-L5 disc spacer. No acute osseous pathology. IMPRESSION: 1. Acute pancreatitis. Correlation with pancreatic enzymes recommended. Multiple pancreatic pseudocysts as above. 2. Masslike prominence of the tail of the pancreas. Further evaluation of the pancreas with MRI without and with contrast or pancreatic protocol CT on a nonemergent/outpatient basis recommended. 3. Multiple nodular densities extending inferiorly from the tail of the pancreas may be sequela of pancreatitis although metastasis is not excluded in this patient with lung mass.  4. Small bilateral pleural effusions, left greater than right with bibasilar atelectasis or infiltrate. Clinical correlation and follow-up recommended. 5. No bowel obstruction. Normal appendix. 6. Aortic Atherosclerosis (ICD10-I70.0). Electronically Signed   By: Anner Crete M.D.   On: 04/20/2020 18:05   DG CHEST PORT 1 VIEW  Result Date: 04/20/2020 CLINICAL DATA:  Sepsis. EXAM: PORTABLE CHEST 1 VIEW COMPARISON:  April 14, 2020. FINDINGS: Stable cardiomegaly. No pneumothorax is noted. Bibasilar atelectasis or infiltrates are noted. Probable small left pleural effusion is noted. Bony thorax is unremarkable. IMPRESSION: Bibasilar atelectasis or infiltrates are noted. Probable small left pleural effusion. Aortic Atherosclerosis (ICD10-I70.0). Electronically Signed   By: Marijo Conception M.D.   On: 04/20/2020 09:18   DG Abd Portable 1V  Result Date: 04/20/2020 CLINICAL DATA:  Abdominal discomfort. EXAM: PORTABLE ABDOMEN - 1 VIEW COMPARISON:  April 18, 2020. FINDINGS: The bowel gas pattern is normal. No radio-opaque calculi or other significant radiographic abnormality are seen. IMPRESSION: Negative. Electronically Signed   By: Marijo Conception M.D.   On: 04/20/2020 09:19   US THYROID  Result Date: 04/19/2020 CLINICAL DATA:  Nodule EXAM: THYROID ULTRASOUND TECHNIQUE: Ultrasound examination of the thyroid gland and adjacent soft tissues was performed. COMPARISON:  None. FINDINGS: Parenchymal Echotexture: Normal Isthmus: 0.6 cm Right lobe: 4.7 x 1.5 x 1.9 cm Left lobe: 6  x 1.7 x 2.1 cm _________________________________________________________ Estimated total number of nodules >/= 1 cm: 1 Number of spongiform nodules >/=  2 cm not described below (TR1): 0 Number of mixed cystic and solid nodules >/= 1.5 cm not described below ([place]): 0 _________________________________________________________ Nodule # 1: Location: Left; Inferior Maximum size: 3 cm; Other 2 dimensions: 2.4 x 2.2 cm Composition: solid/almost  completely solid (2) Echogenicity: hypoechoic (2) Shape: not taller-than-wide (0) Margins: ill-defined (0) Echogenic foci: none (0) ACR TI-RADS total points: 4. ACR TI-RADS risk category: TR4 (4-6 points). ACR TI-RADS recommendations: **Given size (>/= 1.5 cm) and appearance, fine needle aspiration of this moderately suspicious nodule should be considered based on TI-RADS criteria. _________________________________________________________ IMPRESSION: There is a 3 cm TR 4 thyroid nodule in the left inferior thyroid gland. Fine-needle aspiration is recommended for this thyroid nodule. The above is in keeping with the ACR TI-RADS recommendations - J Am Coll Radiol 2017;14:587-595. Electronically Signed   By: Constance Holster M.D.   On: 04/19/2020 18:47   VAS [place] UPPER EXTREMITY VENOUS DUPLEX  Result Date: 04/20/2020 UPPER VENOUS STUDY  Indications: Ongoing edema RUE Comparison Study: Previous RUE study on 04/01/20 was positive for Acute SVT of                   cephalic vein at [facility] fossa. Performing Technologist: Rogelia Rohrer  Examination Guidelines: A complete evaluation includes B-mode imaging, spectral Doppler, color Doppler, and power Doppler as needed of all accessible portions of each vessel. Bilateral testing is considered an integral part of a complete examination. Limited examinations for reoccurring indications may be performed as noted.  Right Findings: +----------+------------+---------+-----------+----------+--------------+ RIGHT     CompressiblePhasicitySpontaneousProperties   Summary     +----------+------------+---------+-----------+----------+--------------+ IJV           Full       Yes       Yes                             +----------+------------+---------+-----------+----------+--------------+ Subclavian    Full       Yes       Yes                             +----------+------------+---------+-----------+----------+--------------+ Axillary      Full       Yes       Yes                              +----------+------------+---------+-----------+----------+--------------+ Brachial      Full       Yes       Yes                             +----------+------------+---------+-----------+----------+--------------+ Radial        Full                                  Not visualized +----------+------------+---------+-----------+----------+--------------+ Ulnar         Full                                  Not visualized +----------+------------+---------+-----------+----------+--------------+ Cephalic      None  No        No                  Chronic     +----------+------------+---------+-----------+----------+--------------+ Basilic       Full       Yes       Yes                             +----------+------------+---------+-----------+----------+--------------+ 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 Findings for superficial vein thrombosis appear unchanged from previous study.  Left: No evidence of thrombosis in the subclavian.  *See table(s) above for measurements and observations.  Diagnosing physician: Jamelle Haring Electronically signed by Jamelle Haring on 04/20/2020 at 5:01:05 PM.    Final    [place] EKG SITE RITE  Result Date: 04/21/2020 If Site Rite image not attached, placement could not be confirmed due to current cardiac rhythm.    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  Possible IR aspirate when her INR is improved. Getting albumin Will continue cefepime for now Her BP has improved, on pressor         Bobby Rumpf MD, FACP Infectious Diseases (pager) [phone] [email or web address] 04/21/2020, 1:45 PM  LOS: 31 days

## 2020-04-21 NOTE — Progress Notes (Signed)
Chief Complaint: Patient was seen in consultation today for abdominal fluid collections  Referring Physician(s): Dr. Oretha Caprice  Supervising Physician: Jacqulynn Cadet  Patient Status: [facility] - In-pt  History of Present Illness: Deborah Figueroa is a 65 y.o. female with multiple medical issues recently admitted with DKA and sepsis in setting of left elbow bursitis and osteomyelitis. She is now found to have evidence suggestive of pancreatitis with possible pancreatic mass. There are additionally multiple fluid collections that could represent pseudocyst vs abscess collections. GI was consulted and after review of imaging with Dr. Laurence Ferrari, have decided to request image guided aspiration/drainage of these collections. She is in ICU on some pressors and also had a recent DIC with INR > 10.0 This is actively being corrected and last INR was 2.9 today. PMHx, meds, labs, imaging, allergies reviewed.. Has been NPO as ordered per GI   Past Medical History:  Diagnosis Date  . Cellulitis   . Depression   . Diabetes mellitus without complication ([place])   . Hyperlipidemia   . Hypertension   . Lung mass 04/15/2020  . Pancreatic mass 04/15/2020  . Renal insufficiency   . Seasonal allergies   . Thyroid nodule 04/15/2020    Past Surgical History:  Procedure Laterality Date  . BACK SURGERY    . BIOPSY  03/30/2020   Procedure: BIOPSY;  Surgeon: Thornton Park, MD;  Location: [facility] ENDOSCOPY;  Service: Gastroenterology;;  . BREAST SURGERY     lumpectomy left breast  . COLONOSCOPY WITH PROPOFOL N/A 04/09/2020   Procedure: COLONOSCOPY WITH PROPOFOL;  Surgeon: Thornton Park, MD;  Location: [facility]OSCOPY;  Service: Gastroenterology;  Laterality: N/A;  . ESOPHAGOGASTRODUODENOSCOPY (EGD) WITH PROPOFOL N/A 04/02/2020   Procedure: ESOPHAGOGASTRODUODENOSCOPY (EGD) WITH PROPOFOL;  Surgeon: Thornton Park, MD;  Location: [facility]OS[facility];  Service: Gastroenterology;  Laterality: N/A;  . I & D  EXTREMITY Left 04/13/2020   Procedure: IRRIGATION AND DEBRIDEMENT elbow;  Surgeon: Tania Ade, MD;  Location: [facility] ORS;  Service: Orthopedics;  Laterality: Left;  . LUMBAR FUSION    . POLYPECTOMY  04/05/2020   Procedure: POLYPECTOMY;  Surgeon: Thornton Park, MD;  Location: [facility];  Service: Gastroenterology;;  . TEE WITHOUT CARDIOVERSION N/A 04/12/2020   Procedure: TRANSESOPHAGEAL ECHOCARDIOGRAM (TEE);  Surgeon: Buford Dresser, MD;  Location: [facility] ENDOSCOPY;  Service: Cardiovascular;  Laterality: N/A;    Allergies: Patient has no known allergies.  Medications:  Current Facility-Administered Medications:  .  (feeding supplement) PROSource Plus liquid 30 mL, 30 mL, Oral, TID BM, Samuella Cota, MD, 30 mL at 04/12/20 1659 .  0.9 %  sodium chloride infusion, 250 mL, Intravenous, Continuous, Anders Simmonds, MD .  acetaminophen (TYLENOL) tablet 650 mg, 650 mg, Oral, Q6H PRN, Samuella Cota, MD, 650 mg at 04/21/20 0252 .  ceFEPIme (MAXIPIME) 2 g in sodium chloride 0.9 % 100 mL IVPB, 2 g, Intravenous, Q24H, Shade, Haze Justin, RPH, Stopped at 04/20/20 1540 .  Chlorhexidine Gluconate Cloth 2 % PADS 6 each, 6 each, Topical, Daily, Samuella Cota, MD, 6 each at 04/20/20 1111 .  dextrose 50 % solution 0-50 mL, 0-50 mL, Intravenous, PRN, Samuella Cota, MD .  hydrocortisone sodium succinate (SOLU-CORTEF) 100 MG injection 50 mg, 50 mg, Intravenous, Q6H, Ollis, Brandi L, NP, 50 mg at 04/21/20 1014 .  insulin aspart (novoLOG) injection 0-15 Units, 0-15 Units, Subcutaneous, Q4H, Samuella Cota, MD, 2 Units at 04/21/20 [phone] .  liver oil-zinc oxide (DESITIN) 40 % ointment, , Topical, PRN, Samuella Cota,  MD, 1 application at 63/89/37 1430 .  MEDLINE mouth rinse, 15 mL, Mouth Rinse, BID, Samuella Cota, MD, 15 mL at 04/21/20 0949 .  metroNIDAZOLE (FLAGYL) IVPB 500 mg, 500 mg, Intravenous, Q8H, Campbell Riches, MD, Stopped at 04/21/20 [phone] .  nystatin  (MYCOSTATIN) 100000 UNIT/ML suspension 500,000 Units, 5 mL, Oral, QID, Samuella Cota, MD, 500,000 Units at 04/20/20 2104 .  nystatin (MYCOSTATIN/NYSTOP) topical powder, , Topical, TID, Samuella Cota, MD, Given at 04/21/20 1014 .  ondansetron (ZOFRAN) tablet 4 mg, 4 mg, Oral, Q6H PRN, 4 mg at 04/16/20 0650 **OR** ondansetron (ZOFRAN) injection 4 mg, 4 mg, Intravenous, Q6H PRN, Samuella Cota, MD, 4 mg at 04/21/20 0909 .  oxymetazoline (AFRIN) 0.05 % nasal spray 3 spray, 3 spray, Each Nare, BID PRN, Samuella Cota, MD, 3 spray at 04/01/20 1800 .  pantoprazole (PROTONIX) injection 40 mg, 40 mg, Intravenous, Q12H, Samuella Cota, MD, 40 mg at 04/21/20 1014 .  phenol (CHLORASEPTIC) mouth spray 1 spray, 1 spray, Mouth/Throat, PRN, Samuella Cota, MD .  phenylephrine (NEOSYNEPHRINE) 10-0.9 MG/250ML-% infusion, 25-200 mcg/min, Intravenous, Titrated, Anders Simmonds, MD, Last Rate: 45 mL/hr at 04/21/20 1254, 30 mcg/min at 04/21/20 1254 .  phytonadione (VITAMIN K) SQ injection 10 mg, 10 mg, Subcutaneous, Daily, Ennever, Rudell Cobb, MD, 10 mg at 04/21/20 1014 .  prochlorperazine (COMPAZINE) injection 10 mg, 10 mg, Intravenous, Q6H PRN, Samuella Cota, MD, 10 mg at 04/20/20 2137 .  sertraline (ZOLOFT) tablet 100 mg, 100 mg, Oral, Daily, Samuella Cota, MD, 100 mg at 04/19/20 1017 .  sodium bicarbonate 150 mEq in sterile water 1,000 mL infusion, , Intravenous, Continuous, Magdalen Spatz, NP, Last Rate: 125 mL/hr at 04/21/20 1134, New Bag at 04/21/20 1134 .  sodium bicarbonate tablet 1,300 mg, 1,300 mg, Oral, QID, Ollis, Brandi L, NP, 1,300 mg at 04/20/20 2105 .  sodium chloride 0.9 % bolus 500 mL, 500 mL, Intravenous, Once, Elie Confer, Sarah F, NP .  traZODone (DESYREL) tablet 50 mg, 50 mg, Oral, QHS PRN, Samuella Cota, MD    Family History  Problem Relation Age of Onset  . Stroke Mother   . Hypertension Mother   . Hyperlipidemia Mother   . Cancer Father 70       liver and  colon  . Depression Father   . Cancer Maternal Aunt        breast  . Diabetes Brother   . Cancer Maternal Grandmother        breast    Social History   Socioeconomic History  . Marital status: Divorced    Spouse name: Not on file  . Number of children: Not on file  . Years of education: Not on file  . Highest education level: Not on file  Occupational History  . Not on file  Tobacco Use  . Smoking status: Never Smoker  . Smokeless tobacco: Never Used  Vaping Use  . Vaping Use: Never used  Substance and Sexual Activity  . Alcohol use: No  . Drug use: No  . Sexual activity: Not on file  Other Topics Concern  . Not on file  Social History Narrative  . Not on file   Social Determinants of Health   Financial Resource Strain: Not on file  Food Insecurity: Not on file  Transportation Needs: Not on file  Physical Activity: Not on file  Stress: Not on file  Social Connections: Not on file     Review of  Systems: A 12 point ROS discussed and pertinent positives are indicated in the HPI above.  All other systems are negative.  Review of Systems  Vital Signs: BP (!) 120/52   Pulse 95   Temp 97.6 F (36.4 C) (Oral)   Resp (!) 25   Ht 5\' 11"  (1.803 m)   Wt 101.5 kg   SpO2 99%   BMI 31.21 kg/m   Physical Exam Constitutional:      Appearance: She is ill-appearing. She is not toxic-appearing.  HENT:     Mouth/Throat:     Mouth: Mucous membranes are dry.     Pharynx: Oropharynx is clear.  Cardiovascular:     Rate and Rhythm: Normal rate and regular rhythm.     Heart sounds: Normal heart sounds.  Pulmonary:     Effort: Pulmonary effort is normal. No respiratory distress.     Breath sounds: Normal breath sounds.  Abdominal:     Palpations: Abdomen is soft.     Comments: Mild generalized tenderness  Skin:    General: Skin is warm.     Coloration: Skin is not jaundiced.  Neurological:     General: No focal deficit present.      Imaging: CT ABDOMEN PELVIS  WO CONTRAST  Result Date: 04/20/2020 CLINICAL DATA:  65 year old female with nausea vomiting. EXAM: CT ABDOMEN AND PELVIS WITHOUT CONTRAST TECHNIQUE: Multidetector CT imaging of the abdomen and pelvis was performed following the standard protocol without IV contrast. COMPARISON:  Abdominal radiograph dated 04/20/2020. FINDINGS: Evaluation of this exam is limited in the absence of intravenous contrast. Lower chest: Small bilateral pleural effusions, left greater than right with associated partial consolidative changes of the lower lobes with air bronchogram which may represent atelectasis or infiltrate. Clinical correlation and follow-up recommended. Coronary vascular calcification. No intra-abdominal free air.  Trace free fluid in the pelvis. Hepatobiliary: The liver is unremarkable. No intrahepatic biliary ductal dilatation. Multiple gallstones. No pericholecystic fluid or evidence of acute cholecystitis by CT Pancreas: There is nodular stranding surrounding the pancreas most consistent with acute pancreatitis. Correlation with pancreatic enzymes recommended. There is a 3 x 4 cm masslike prominence of the tail of the pancreas (29/2) which is not well visualized or evaluated on this noncontrast CT. Further evaluation of the pancreas with MRI without and with contrast or pancreatic protocol CT on a nonemergent/outpatient basis recommended. A 6.2 x 2.6 cm bilobed fluid attenuating density inferior to the pancreas most likely represents a pseudocyst. Additionally there is a 4.0 x 8.4 x 3.9 cm mixed density collection inferior to the spleen, likely an area of fat necrosis or developing infarct or pseudocyst. Scattered nodularity extending inferiorly from the tail of the pancreas may be sequela of pancreatitis although metastasis is not excluded in this patient with lung mass. Several nodular densities anterior to the distal left psoas muscle measuring up to 3.5 x 3.5 cm (64/2). There is a 5.0 x 3.5 cm cystic structure  in the left lower abdomen (52/2) as well as a 5.8 x 3.0 cm low attenuating, fluid anterior to the distal psoas muscle (56/2) are not characterized but possibly pseudocysts. Spleen: The spleen is unremarkable. Adrenals/Urinary Tract: The adrenal glands unremarkable. There is no hydronephrosis or nephrolithiasis on either side. There is a 15 mm left renal interpolar hypodense lesion which is not well characterized but may represent a cyst. Focus of scarring and calcification in the inferior pole of the left kidney. The visualized ureters and urinary bladder appear unremarkable. Stomach/Bowel: There is  no bowel obstruction or active inflammation. The appendix is normal. Vascular/Lymphatic: Moderate aortoiliac atherosclerotic disease. The aorta is ectatic measuring up to 2.2 cm. The IVC is unremarkable. No portal venous gas. No adenopathy. Reproductive: The uterus is retroflexed.  No adnexal masses. Other: Diffuse subcutaneous edema.  No fluid collection. Musculoskeletal: Degenerative changes of the spine. L4-L5 disc spacer. No acute osseous pathology. IMPRESSION: 1. Acute pancreatitis. Correlation with pancreatic enzymes recommended. Multiple pancreatic pseudocysts as above. 2. Masslike prominence of the tail of the pancreas. Further evaluation of the pancreas with MRI without and with contrast or pancreatic protocol CT on a nonemergent/outpatient basis recommended. 3. Multiple nodular densities extending inferiorly from the tail of the pancreas may be sequela of pancreatitis although metastasis is not excluded in this patient with lung mass. 4. Small bilateral pleural effusions, left greater than right with bibasilar atelectasis or infiltrate. Clinical correlation and follow-up recommended. 5. No bowel obstruction. Normal appendix. 6. Aortic Atherosclerosis (ICD10-I70.0). Electronically Signed   By: Anner Crete M.D.   On: 04/20/2020 18:05   DG Wrist 2 Views Right  Result Date: 04/16/2020 CLINICAL DATA:  Right  wrist pain after fall last month. EXAM: RIGHT WRIST - 2 VIEW COMPARISON:  None. FINDINGS: There is no evidence of fracture or dislocation. Severe narrowing of radiocarpal joint is noted as well as several intercarpal joints. Moderate degenerative changes seen involving the first carpometacarpal joint. Soft tissues are unremarkable. IMPRESSION: Multifocal degenerative joint disease. No acute abnormality seen in the right wrist. Electronically Signed   By: Marijo Conception M.D.   On: 04/16/2020 17:03   CT CHEST WO CONTRAST  Result Date: 04/14/2020 CLINICAL DATA:  Unresolved pneumonia. EXAM: CT CHEST WITHOUT CONTRAST TECHNIQUE: Multidetector CT imaging of the chest was performed following the standard protocol without IV contrast. COMPARISON:  CT chest dated March 30, 2020 FINDINGS: Cardiovascular: There are atherosclerotic changes of the thoracic aorta without evidence for an aneurysm. There are coronary artery calcifications. The heart size is unremarkable. There is no significant pericardial effusion. The intracardiac blood pool is hypodense relative to the adjacent myocardium consistent with anemia. Mediastinum/Nodes: -- No mediastinal lymphadenopathy. -- No hilar lymphadenopathy. -- No axillary lymphadenopathy. -- No supraclavicular lymphadenopathy. --there is a left-sided thyroid nodule measuring approximately 2.7 cm (axial series 2, image 22). -  Unremarkable esophagus. Lungs/Pleura: There are small bilateral pleural effusions, left greater than right. Again noted is a masslike area at the right lung apex, currently measuring approximately 2.4 cm. This mass is near fluid density measuring approximately 4 Hounsfield units. There is persistent consolidation involving the patient's left lower lobe with near complete collapse of the left lower lobe. There is persistent consolidation in the right lung base. Additional peripheral airspace opacities are noted bilaterally, not substantially changed from prior study.  The trachea is unremarkable. Upper Abdomen: There appears to be a mass at the pancreatic tail measuring approximately 5.6 x 4.9 cm. This is not well evaluated as it is only partially visualized, additionally lack of IV contrast limits evaluation. There is fat stranding about the partially visualized pancreas. There is extensive edema along the patient's left flank that is only partially visualized. There appears to be a new fluid collection near the GE junction. Musculoskeletal: There is an apparent fracture through the T1 transverse process. There is lucency involving the posterior elements of the C7 and T1 vertebral bodies on the right (axial series 5, image 13). There is a heterogeneous appearance of the T7 and T8 vertebral bodies. There are bilateral  glenohumeral joint effusions. IMPRESSION: 1. Evaluation is limited by lack of IV contrast. 2. There is persistent bibasilar airspace disease, with a growing left-sided pleural effusion. There is no pneumothorax. 3. Again noted is a possible right apical lung mass with a mottled appearance of the nearby C7 and T1 vertebral bodies with a possible pathologic fracture through the T1 transverse process on the right. Additionally, there is an abnormal appearance of the T7 and T8 vertebral bodies. Findings raise concern for underlying metastatic disease. A follow-up contrast enhanced cervical and thoracic spine MRI is recommended. 4. Very abnormal appearance of the partially visualized pancreas with a questionable pancreatic tail mass. There is new fluid abutting the proximal stomach. Follow-up with a contrast enhanced CT of the abdomen is recommended for further evaluation of this finding. 5. Nonspecific left flank edema. Correlation with physical exam and contrast-enhanced CT is recommended. 6. Anemia. 7. There is a 2.7 cm left-sided thyroid nodule. Recommend thyroid US (ref: J Am Coll Radiol. 2015 Feb;12(2): 143-50). 8. There are nonspecific bilateral glenohumeral joint  effusions. 9.  Aortic Atherosclerosis (ICD10-I70.0). Aortic Atherosclerosis (ICD10-I70.0). Electronically Signed   By: Constance Holster M.D.   On: 04/14/2020 20:04   CT CHEST WO CONTRAST  Result Date: 03/30/2020 CLINICAL DATA:  Pneumonia, abnormal chest radiograph with questionable cavitation in the LEFT lower lobe raising question of lung abscess EXAM: CT CHEST WITHOUT CONTRAST TECHNIQUE: Multidetector CT imaging of the chest was performed following the standard protocol without IV contrast. Sagittal and coronal MPR images reconstructed from axial data set. COMPARISON:  Chest radiograph 03/30/2020 FINDINGS: Cardiovascular: Atherosclerotic calcifications aorta, proximal great vessels and coronary arteries. 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 Heart upper normal size. No pericardial effusion. Mediastinum/Nodes: Esophagus unremarkable. LEFT thyroid nodule 2.2 x 1.9 cm; recommend thyroid US. (Ref: J Am Coll Radiol. 2015 Feb;12(2): 143-50). Base of cervical region otherwise normal appearance. No thoracic adenopathy. Lungs/Pleura: RIGHT apex mass 2.7 x 2.3 x 2.0 cm concerning for neoplasm, potentially with chest wall invasion. BILATERAL lower lobe infiltrates consistent with atelectasis and pneumonia. No pleural effusion or pneumothorax. Upper Abdomen: Adrenal thickening without mass. Ill-defined peripancreatic tissue planes raising question of pancreatitis at tail, incompletely visualized and assessed. Infiltrative changes appear to extend between the stomach and spleen. Musculoskeletal: Scattered mild degenerative disc disease changes of the thoracic spine. 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 BILATERAL lower lobe infiltrates consistent with atelectasis and pneumonia. No evidence of cavitary pneumonia/lung abscess. Ill-defined peripancreatic tissue planes raising question of  pancreatitis at tail, incompletely visualized and assessed; recommend correlation with serum lipase and dedicated CT abdomen. LEFT thyroid nodule 2.2 x 1.9 cm; recommend thyroid ultrasound assessment as above. Aortic Atherosclerosis (ICD10-I70.0). Electronically Signed   By: Lavonia Dana M.D.   On: 03/30/2020 11:20   CT HUMERUS LEFT W CONTRAST  Result Date: 03/19/2020 CLINICAL DATA:  Evaluate olecranon osteomyelitis and abscesses. EXAM: CT OF THE UPPER LEFT EXTREMITY WITH CONTRAST TECHNIQUE: Multidetector CT imaging of the upper left extremity was performed according to the standard protocol following intravenous contrast administration. CONTRAST:  49mL OMNIPAQUE IOHEXOL 300 MG/ML  SOLN COMPARISON:  Limited MRI examination from 03/22/2020 FINDINGS: There are destructive bony changes involving the olecranon consistent with osteomyelitis. Adjacent inflammatory phlegmon and abscess noted in the region of the olecranon bursa likely septic bursitis. As seen on the MRI there is also a small fluid collection just anterior to the lower  biceps muscle. This measured approximately 17 x 10 mm on the MRI and measures approximately 13 x 9 mm on the CT scan. There is also evidence of pyomyositis most likely in the flexor digitorum superficialis muscle of the proximal forearm. The larger abscess measures 15 x 14 mm and the smaller adjacent abscess measures 8 mm. Surrounding myositis and cellulitis is noted. I do not see any findings suspicious for septic arthritis at the elbow joint. IMPRESSION: 1. Osteomyelitis involving the olecranon with adjacent inflammatory phlegmon and abscess in the region of the olecranon bursa. 2. Evidence of pyomyositis most likely in the flexor digitorum superficialis muscle of the proximal forearm. 3. Small abscess along the ventral aspect of the lower biceps muscle. 4. No CT findings suspicious for septic arthritis at the elbow joint. Electronically Signed   By: Marijo Sanes M.D.   On: 03/17/2020  13:32   MR BRAIN WO CONTRAST  Result Date: 03/22/2020 CLINICAL DATA:  Mental status change, multiple falls, right arm numbness EXAM: MRI HEAD WITHOUT CONTRAST TECHNIQUE: Multiplanar, multiecho pulse sequences of the brain and surrounding structures were obtained without intravenous contrast. COMPARISON:  None. FINDINGS: Brain: There is no acute infarction or intracranial hemorrhage. There is no intracranial mass, mass effect, or edema. There is no hydrocephalus or extra-axial fluid collection. Multiple small foci of susceptibility hypointensity with involvement of the basal ganglia, thalamus, and pons consistent with chronic hypertensive microhemorrhages. Few scattered small foci of T2 hyperintensity in the supratentorial white matter are nonspecific but may reflect minor chronic microvascular ischemic changes. Ventricles and sulci are within normal limits in size and configuration. Vascular: Major vessel flow voids at the skull base are preserved. Skull and upper cervical spine: Marrow signal within normal limits. Sinuses/Orbits: Paranasal sinuses are aerated. Orbits are unremarkable. Other: Right frontal scalp hematoma. Sella is unremarkable. Mastoid air cells are clear. IMPRESSION: No evidence of recent infarction, hemorrhage, or mass. Chronic hypertensive microhemorrhages. Minor chronic microvascular ischemic changes. Electronically Signed   By: Macy Mis M.D.   On: 03/22/2020 16:36   MR CERVICAL SPINE WO CONTRAST  Result Date: 04/17/2020 CLINICAL DATA:  Apical lung mass with bone involvement. Assess for spinal metastatic disease. History of renal disease. EXAM: MRI CERVICAL SPINE WITHOUT CONTRAST TECHNIQUE: Multiplanar, multisequence MR imaging of the cervical spine was performed. No intravenous contrast was administered. COMPARISON:  Chest CT 04/14/2020 FINDINGS: Alignment: 1 or 2 mm of degenerative anterolisthesis at C7-T1. Otherwise normal. Vertebrae: I do not see evidence clear metastatic disease  to bone. There is some endplate edema at the X5-2 level to a mild degree and a more pronounced degree at the C7-T1 level. The differential diagnosis is degenerative disease versus is early discitis osteomyelitis versus arthropathy associated with chronic renal disease. See below. Cord: No cord compression or primary cord lesion. Posterior Fossa, vertebral arteries, paraspinal tissues: Limited visualization of the posterior fossa is unremarkable. There is a right paravertebral T2 bright abnormality most consistent with a fluid collection, extending from C6 to T2 along the anterior right margin of the vertebral bodies. There appears to be involvement of the costovertebral articulation on the right at the first rib. Question if this is in communication with fluid associated with right-sided facet arthropathy at C7-T1. See below. Disc levels: Foramen magnum is widely patent.  C1-2 and C2-3 are unremarkable. C3-4: Endplate osteophytes and bulging of the disc. More prominent on the left. Mild narrowing of the left side of the canal and intervertebral foramen on the left. Some potential this could  affect the left C4 nerve. As noted above, there is mild endplate edema, which I favor is degenerative at this level. C4-5: Chronic disc degeneration with endplate osteophytes and bulging of the disc. No compressive canal stenosis. Moderate foraminal narrowing on the left that could affect the C5 nerve. C5-6: Endplate osteophytes and bulging of the disc. No compressive canal or foraminal narrowing. C6-7: Endplate osteophytes and bulging of the disc. No compressive canal or foraminal narrowing. C7-T1: Facet arthropathy on the right with edematous change. Disc pathology with disc space narrowing and fluid intensity material in the disc space. Mild endplate edema. As noted above, there is a right paravertebral fluid collection extending from C6-T2. There is also pathology affecting the right first costovertebral articulation. Whereas  all these findings could conceivably relate to degenerative disease or spondylopathy of renal failure, infectious inflammation is a real concern. This does not look to me like neoplastic disease. I would also say that the pleural density at the apex on the right is also T2 bright and could be a similar fluid collection. IMPRESSION: I do not see findings that suggest that we are dealing with metastatic disease to the spine. What was described as a mass at the right lung apex looks more like a fluid collection by MRI. Additionally, there is a right paravertebral fluid collection extending from C6-T2. There is fluid associated with the right C7-T1 facet joint, the right first costovertebral articulation, and within the C7-T1 disc space. This constellation of findings is most concerning for spinal infection. Spondylopathy of renal failure could possibly relate to these findings, but infection is my primary concern. Electronically Signed   By: Nelson Chimes M.D.   On: 04/17/2020 22:03   MR THORACIC SPINE WO CONTRAST  Result Date: 04/18/2020 CLINICAL DATA:  Cancer of unknown primary. Staging apical lung mass. Bacteremia EXAM: MRI THORACIC SPINE WITHOUT CONTRAST TECHNIQUE: Multiplanar, multisequence MR imaging of the thoracic spine was performed. No intravenous contrast was administered. COMPARISON:  Chest CT from 3 days ago. Cervical MRI from the same day. FINDINGS: The patient terminated the study early. Only sagittal T2 and axial T2 weighted imaging was acquired. Apparent fluid collection at the right first costovertebral junction was better seen on prior cervical MRI, with attendant regional marrow signal abnormality and presumed fluid collection at the right superior sulcus measuring 17 mm. At T7-8 there is disc space T2 hyperintensity without ankylosis but with subtle endplate erosions by CT. The endplates appear indistinct by T1 weighted imaging on the sagittal counter sequence as well. No visible adjacent  paravertebral fat inflammation. T9-10 central disc protrusion without neural impingement. Small left more than right pleural effusion IMPRESSION: 1. Truncated study due to patient request. 2. Presumed fluid collection at the right first costovertebral junction with presumed separate fluid collection at the superior sulcus. As noted on prior cervical MRI, spinal infection is the primary concern. 3. 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 Electronically Signed   By: Monte Fantasia M.D.   On: 04/18/2020 09:29   CT FOREARM LEFT W CONTRAST  Result Date: 04/11/2020 CLINICAL DATA:  Evaluate olecranon osteomyelitis and abscesses. EXAM: CT OF THE UPPER LEFT EXTREMITY WITH CONTRAST TECHNIQUE: Multidetector CT imaging of the upper left extremity was performed according to the standard protocol following intravenous contrast administration. CONTRAST:  48mL OMNIPAQUE IOHEXOL 300 MG/ML  SOLN COMPARISON:  Limited MRI examination from 03/22/2020 FINDINGS: There are destructive bony changes involving the olecranon consistent with osteomyelitis. Adjacent inflammatory phlegmon and abscess noted in the region of the  olecranon bursa likely septic bursitis. As seen on the MRI there is also a small fluid collection just anterior to the lower biceps muscle. This measured approximately 17 x 10 mm on the MRI and measures approximately 13 x 9 mm on the CT scan. There is also evidence of pyomyositis most likely in the flexor digitorum superficialis muscle of the proximal forearm. The larger abscess measures 15 x 14 mm and the smaller adjacent abscess measures 8 mm. Surrounding myositis and cellulitis is noted. I do not see any findings suspicious for septic arthritis at the elbow joint. IMPRESSION: 1. Osteomyelitis involving the olecranon with adjacent inflammatory phlegmon and abscess in the region of the olecranon bursa. 2. Evidence of pyomyositis most likely in the flexor digitorum superficialis muscle of the proximal forearm.  3. Small abscess along the ventral aspect of the lower biceps muscle. 4. No CT findings suspicious for septic arthritis at the elbow joint. Electronically Signed   By: Marijo Sanes M.D.   On: 03/26/2020 13:32   DG Hand 2 View Right  Result Date: 04/16/2020 CLINICAL DATA:  Right hand pain after fall last month. EXAM: RIGHT HAND - 2 VIEW COMPARISON:  None. FINDINGS: There is no evidence of fracture or dislocation. Moderate degenerative changes seen involving the first carpometacarpal joint. Soft tissues are unremarkable. IMPRESSION: Moderate degenerative joint disease of the first carpometacarpal joint. No acute abnormality seen in the right hand. Electronically Signed   By: Marijo Conception M.D.   On: 04/16/2020 17:04   MR ELBOW LEFT WO CONTRAST  Result Date: 03/22/2020 CLINICAL DATA:  Left elbow pain.  MSSA bacteremia. EXAM: MRI OF THE LEFT ELBOW WITHOUT CONTRAST TECHNIQUE: Multiplanar, multisequence MR imaging of the elbow was performed. No intravenous contrast was administered. COMPARISON:  Left elbow x-rays dated March 19, 2020. FINDINGS: Incomplete study. The patient refused further imaging. Sagittal sequence is nondiagnostic. No coronal sequences were obtained. TENDONS Common forearm flexor origin: Intact. Common forearm extensor origin: Intact. Biceps: Intact. Triceps: High-grade partial tear at the insertion, incompletely evaluated. LIGAMENTS Medial stabilizers: Limited evaluation.  Grossly intact. Lateral stabilizers:  Limited evaluation.  Grossly intact. Cartilage: Not well evaluated by the axial sequences. Joint: Moderate joint effusion. Cubital tunnel: Increased T2 signal of the ulnar nerve within the cubital tunnel, likely reactive. Bones: Patchy marrow edema and heterogeneously decreased T1 marrow signal involving the olecranon and proximal ulna. Other: Proximal forearm flexor compartment intramuscular fluid collection measuring 1.6 x 4.3 cm (series 9, images 1-10), with surrounding muscle edema.  Additional incompletely visualized ill-defined fluid collection in the ulnar aspect of the distal upper arm medial to the brachialis muscle measuring 3.1 x 2.0 cm (series 9, image 30). Incompletely visualized 1.0 x 1.8 cm fluid collection in the distal upper arm anterior to the biceps myotendinous junction (series 9, image 30). IMPRESSION: 1. Limited, incomplete study. The patient refused further imaging. 2. 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 3. Moderate joint effusion concerning for septic arthritis. 4. Proximal forearm flexor compartment intramuscular fluid collection measuring 1.6 x 4.3 cm with surrounding muscle edema, concerning for abscess. 5. Additional incompletely visualized small fluid collections in the distal upper arm medial to the brachialis muscle and anterior to the biceps myotendinous junction, also concerning for abscesses. 6. High-grade partial tear of the distal triceps tendon, incompletely evaluated. Electronically Signed   By: Titus Dubin M.D.   On: 03/22/2020 17:40   DG CHEST PORT 1 VIEW  Result Date: 04/20/2020 CLINICAL DATA:  Sepsis. EXAM: PORTABLE CHEST 1  VIEW COMPARISON:  April 14, 2020. FINDINGS: Stable cardiomegaly. No pneumothorax is noted. Bibasilar atelectasis or infiltrates are noted. Probable small left pleural effusion is noted. Bony thorax is unremarkable. IMPRESSION: Bibasilar atelectasis or infiltrates are noted. Probable small left pleural effusion. Aortic Atherosclerosis (ICD10-I70.0). Electronically Signed   By: Marijo Conception M.D.   On: 04/20/2020 09:18   DG CHEST PORT 1 VIEW  Result Date: 04/14/2020 CLINICAL DATA:  Pneumonia and hyperglycemia.  Post EGD 03/17/2020 EXAM: PORTABLE CHEST 1 VIEW COMPARISON:  120 match that 03/30/2020 FINDINGS: Heart size upper normal. Vascularity normal. Progressive left lower lobe consolidation with small left effusion. Mild right lower lobe airspace disease slightly improved. IMPRESSION: Progressive left lower lobe  consolidation and left effusion. Mild right lower lobe airspace disease slightly improved. Electronically Signed   By: Franchot Gallo M.D.   On: 04/14/2020 10:40   DG CHEST PORT 1 VIEW  Result Date: 03/30/2020 CLINICAL DATA:  Respiratory failure and hemoptysis. EXAM: PORTABLE CHEST 1 VIEW COMPARISON:  04/04/2020 FINDINGS: The heart size is stable. Worsening bilateral lower lobe pneumonia, left greater than right. There is suggestion potential component of cavitation in the left lower lobe airspace process but this is not very well-defined on the portable chest x-ray. No edema, pneumothorax or pleural fluid identified. IMPRESSION: Worsening bilateral lower lobe pneumonia, left greater than right. There is suggestion of potential component of cavitation in the left lower lobe airspace process. Electronically Signed   By: Aletta Edouard M.D.   On: 03/30/2020 09:20   DG Abd Portable 1V  Result Date: 04/20/2020 CLINICAL DATA:  Abdominal discomfort. EXAM: PORTABLE ABDOMEN - 1 VIEW COMPARISON:  April 18, 2020. FINDINGS: The bowel gas pattern is normal. No radio-opaque calculi or other significant radiographic abnormality are seen. IMPRESSION: Negative. Electronically Signed   By: Marijo Conception M.D.   On: 04/20/2020 09:19   DG Abd Portable 1V  Result Date: 04/18/2020 CLINICAL DATA:  Nausea. EXAM: PORTABLE ABDOMEN - 1 VIEW COMPARISON:  None. FINDINGS: The bowel gas pattern is normal. No radio-opaque calculi or other significant radiographic abnormality are seen. IMPRESSION: Negative. Electronically Signed   By: Marijo Conception M.D.   On: 04/18/2020 10:17   ECHOCARDIOGRAM COMPLETE  Result Date: 04/01/2020    ECHOCARDIOGRAM REPORT   Patient Name:   JALEY YAN Pogue Date of Exam: 03/23/2020 Medical Rec #:  [id number]         Height:       71.0 in Accession #:    [id number]        Weight:       207.0 lb Date of Birth:  May 22, 1955         BSA:          2.140 m Patient Age:    80 years          BP:            139/78 mmHg Patient Gender: F                 HR:           123 bpm. Exam Location:  Inpatient Procedure: 2D Echo, Color Doppler and Cardiac Doppler Indications:    Bacteremia R78.81  History:        Patient has no prior history of Echocardiogram examinations.                 CHF; Risk Factors:Hypertension, Dyslipidemia and Diabetes.  Sonographer:    Bernadene Person RDCS Referring  Phys: [id number] [place]  1. Left ventricular ejection fraction, by estimation, is 65 to 70%. The left ventricle has hyperdynamic function. The left ventricle has no regional wall motion abnormalities. There is mild left ventricular hypertrophy. Indeterminate diastolic filling due to E-A fusion. There is a LV mid-cavity gradient, peak 57 mmHg.  2. Right ventricular systolic function is normal. The right ventricular size is normal. Tricuspid regurgitation signal is inadequate for assessing PA pressure.  3. The aortic valve is tricuspid. Aortic valve regurgitation is not visualized. Mild aortic valve sclerosis is present, with no evidence of aortic valve stenosis.  4. The mitral valve is normal in structure. No evidence of mitral valve regurgitation. No evidence of mitral stenosis.  5. The inferior vena cava is normal in size with greater than 50% respiratory variability, suggesting right atrial pressure of 3 mmHg. FINDINGS  Left Ventricle: Left ventricular ejection fraction, by estimation, is 65 to 70%. The left ventricle has hyperdynamic function. The left ventricle has no regional wall motion abnormalities. The left ventricular internal cavity size was normal in size. There is mild left ventricular hypertrophy. Indeterminate diastolic filling due to E-A fusion. Right Ventricle: The right ventricular size is normal. No increase in right ventricular wall thickness. Right ventricular systolic function is normal. Tricuspid regurgitation signal is inadequate for assessing PA pressure. Left Atrium: Left atrial size was normal in  size. Right Atrium: Right atrial size was normal in size. Pericardium: There is no evidence of pericardial effusion. Mitral Valve: The mitral valve is normal in structure. No evidence of mitral valve regurgitation. No evidence of mitral valve stenosis. Tricuspid Valve: The tricuspid valve is normal in structure. Tricuspid valve regurgitation is trivial. Aortic Valve: The aortic valve is tricuspid. Aortic valve regurgitation is not visualized. Mild aortic valve sclerosis is present, with no evidence of aortic valve stenosis. Pulmonic Valve: The pulmonic valve was normal in structure. Pulmonic valve regurgitation is not visualized. Aorta: The aortic root is normal in size and structure. Venous: The inferior vena cava is normal in size with greater than 50% respiratory variability, suggesting right atrial pressure of 3 mmHg. IAS/Shunts: No atrial level shunt detected by color flow Doppler.  LEFT VENTRICLE PLAX 2D LVIDd:         3.20 cm  Diastology LVIDs:         1.80 cm  LV e' medial:    13.20 cm/s LV PW:         1.30 cm  LV E/e' medial:  9.4 LV IVS:        1.30 cm  LV e' lateral:   14.30 cm/s LVOT diam:     1.90 cm  LV E/e' lateral: 8.7 LV SV:         75 LV SV Index:   35 LVOT Area:     2.84 cm  RIGHT VENTRICLE RV S prime:     19.20 cm/s TAPSE (M-mode): 1.9 cm LEFT ATRIUM             Index       RIGHT ATRIUM           Index LA diam:        2.80 cm 1.31 cm/m  RA Area:     13.20 cm LA Vol (A2C):   44.9 ml 20.99 ml/m RA Volume:   30.50 ml  14.25 ml/m LA Vol (A4C):   45.5 ml 21.27 ml/m LA Biplane Vol: 45.1 ml 21.08 ml/m  AORTIC VALVE LVOT Vmax:  178.00 cm/s LVOT Vmean:  138.000 cm/s LVOT VTI:    0.266 m  AORTA Ao Root diam: 3.30 cm Ao Asc diam:  3.60 cm MITRAL VALVE MV Area (PHT): 8.62 cm     SHUNTS MV Decel Time: 88 msec      Systemic VTI:  0.27 m MV E velocity: 124.00 cm/s  Systemic Diam: 1.90 cm MV A velocity: 81.40 cm/s MV E/A ratio:  1.52 Loralie Champagne MD Electronically signed by Loralie Champagne MD Signature  Date/Time: 04/01/2020/5:00:32 PM    Final    ECHO TEE  Result Date: 04/02/2020    TRANSESOPHOGEAL ECHO REPORT   Patient Name:   GENEAN ADAMSKI Paye Date of Exam: 03/25/2020 Medical Rec #:  [id number]         Height:       71.0 in Accession #:    [id number]        Weight:       207.0 lb Date of Birth:  10/16/1955         BSA:          2.140 m Patient Age:    79 years          BP:           120/69 mmHg Patient Gender: F                 HR:           117 bpm. Exam Location:  Inpatient Procedure: Transesophageal Echo and Color Doppler Indications:     bacteremia  History:         Patient has prior history of Echocardiogram examinations, most                  recent 04/02/2020. Sepsis.  Sonographer:     Johny Chess Referring Phys:  Gordon Diagnosing Phys: Buford Dresser MD PROCEDURE: After discussion of the risks and benefits of a TEE, an informed consent was obtained from the patient. The transesophogeal probe was passed without difficulty through the esophogus of the patient. Sedation performed by different physician. The patient was monitored while under deep sedation. Anesthestetic sedation was provided intravenously by Anesthesiology: 260.5mg  of Propofol. The patient's vital signs; including heart rate, blood pressure, and oxygen saturation; remained stable throughout the procedure. The patient developed no complications during the procedure. IMPRESSIONS  1. Left ventricular ejection fraction, by estimation, is 65 to 70%. The left ventricle has hyperdynamic function.  2. Right ventricular systolic function is normal. The right ventricular size is normal.  3. No left atrial/left atrial appendage thrombus was detected.  4. The mitral valve is normal in structure. Trivial mitral valve regurgitation. No evidence of mitral stenosis.  5. The aortic valve is tricuspid. Aortic valve regurgitation is not visualized. Mild aortic valve sclerosis is present, with no evidence of aortic valve stenosis.  6.  Aortic focal calcification in aortic root. There is Moderate (Grade III) plaque involving the descending aorta. Conclusion(s)/Recommendation(s): No evidence of vegetation/infective endocarditis on this transesophageal echocardiogram. FINDINGS  Left Ventricle: Left ventricular ejection fraction, by estimation, is 65 to 70%. The left ventricle has hyperdynamic function. The left ventricular internal cavity size was normal in size. Right Ventricle: The right ventricular size is normal. No increase in right ventricular wall thickness. Right ventricular systolic function is normal. Left Atrium: Left atrial size was not assessed. No left atrial/left atrial appendage thrombus was detected. Right Atrium: Right atrial size was not assessed. Pericardium: Trivial pericardial effusion is  present. Mitral Valve: The mitral valve is normal in structure. There is mild thickening of the mitral valve leaflet(s). Trivial mitral valve regurgitation. No evidence of mitral valve stenosis. There is no evidence of mitral valve vegetation. Tricuspid Valve: The tricuspid valve is normal in structure. Tricuspid valve regurgitation is trivial. No evidence of tricuspid stenosis. There is no evidence of tricuspid valve vegetation. Aortic Valve: The aortic valve is tricuspid. Aortic valve regurgitation is not visualized. Mild aortic valve sclerosis is present, with no evidence of aortic valve stenosis. There is no evidence of aortic valve vegetation. Pulmonic Valve: The pulmonic valve was grossly normal. Pulmonic valve regurgitation is trivial. There is no evidence of pulmonic valve vegetation. Aorta: Focal calcification in aortic root. There is moderate (Grade III) plaque involving the descending aorta. IAS/Shunts: No atrial level shunt detected by color flow Doppler. There is no evidence of a patent foramen ovale. There is no evidence of an atrial septal defect. Buford Dresser MD Electronically signed by Buford Dresser MD  Signature Date/Time: 04/02/2020/9:34:04 AM    Final    US THYROID  Result Date: 04/19/2020 CLINICAL DATA:  Nodule EXAM: THYROID ULTRASOUND TECHNIQUE: Ultrasound examination of the thyroid gland and adjacent soft tissues was performed. COMPARISON:  None. FINDINGS: Parenchymal Echotexture: Normal Isthmus: 0.6 cm Right lobe: 4.7 x 1.5 x 1.9 cm Left lobe: 6 x 1.7 x 2.1 cm _________________________________________________________ Estimated total number of nodules >/= 1 cm: 1 Number of spongiform nodules >/=  2 cm not described below (TR1): 0 Number of mixed cystic and solid nodules >/= 1.5 cm not described below (TR2): 0 _________________________________________________________ Nodule # 1: Location: Left; Inferior Maximum size: 3 cm; Other 2 dimensions: 2.4 x 2.2 cm Composition: solid/almost completely solid (2) Echogenicity: hypoechoic (2) Shape: not taller-than-wide (0) Margins: ill-defined (0) Echogenic foci: none (0) ACR TI-RADS total points: 4. ACR TI-RADS risk category: TR4 (4-6 points). ACR TI-RADS recommendations: **Given size (>/= 1.5 cm) and appearance, fine needle aspiration of this moderately suspicious nodule should be considered based on TI-RADS criteria. _________________________________________________________ IMPRESSION: There is a 3 cm TR 4 thyroid nodule in the left inferior thyroid gland. Fine-needle aspiration is recommended for this thyroid nodule. The above is in keeping with the ACR TI-RADS recommendations - J Am Coll Radiol 2017;14:587-595. Electronically Signed   By: Constance Holster M.D.   On: 04/19/2020 18:47   VAS [place] UPPER EXTREMITY VENOUS DUPLEX  Result Date: 04/20/2020 UPPER VENOUS STUDY  Indications: Ongoing edema RUE Comparison Study: Previous RUE study on 04/01/20 was positive for Acute SVT of                   cephalic vein at [facility] fossa. Performing Technologist: Rogelia Rohrer  Examination Guidelines: A complete evaluation includes B-mode imaging, spectral Doppler, color Doppler, and  power Doppler as needed of all accessible portions of each vessel. Bilateral testing is considered an integral part of a complete examination. Limited examinations for reoccurring indications may be performed as noted.  Right Findings: +----------+------------+---------+-----------+----------+--------------+ RIGHT     CompressiblePhasicitySpontaneousProperties   Summary     +----------+------------+---------+-----------+----------+--------------+ IJV           Full       Yes       Yes                             +----------+------------+---------+-----------+----------+--------------+ Subclavian    Full       Yes  Yes                             +----------+------------+---------+-----------+----------+--------------+ Axillary      Full       Yes       Yes                             +----------+------------+---------+-----------+----------+--------------+ Brachial      Full       Yes       Yes                             +----------+------------+---------+-----------+----------+--------------+ Radial        Full                                  Not visualized +----------+------------+---------+-----------+----------+--------------+ Ulnar         Full                                  Not visualized +----------+------------+---------+-----------+----------+--------------+ Cephalic      None       No        No                  Chronic     +----------+------------+---------+-----------+----------+--------------+ Basilic       Full       Yes       Yes                             +----------+------------+---------+-----------+----------+--------------+ 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 Findings for superficial vein thrombosis appear unchanged from previous study.  Left: No evidence of thrombosis in the subclavian.  *See table(s) above for measurements and observations.  Diagnosing physician: Jamelle Haring Electronically signed by Jamelle Haring on 04/20/2020 at 5:01:05 PM.    Final    VAS [place] UPPER EXTREMITY VENOUS DUPLEX  Result Date: 04/01/2020 UPPER VENOUS STUDY  Indications: Edema Risk Factors: None identified. Comparison Study: No prior studies. Performing Technologist: Oliver Hum RVT  Examination Guidelines: A complete evaluation includes B-mode imaging, spectral Doppler, color Doppler, and power Doppler as needed of all accessible portions of each vessel. Bilateral testing is considered an integral part of a complete examination. Limited examinations for reoccurring indications may be performed as noted.  Right Findings: +----------+------------+---------+-----------+----------+-------+ RIGHT     CompressiblePhasicitySpontaneousPropertiesSummary +----------+------------+---------+-----------+----------+-------+ IJV           Full       Yes       Yes                      +----------+------------+---------+-----------+----------+-------+ Subclavian  Full       Yes       Yes                      +----------+------------+---------+-----------+----------+-------+ Axillary      Full       Yes       Yes                      +----------+------------+---------+-----------+----------+-------+ Brachial      Full       Yes       Yes                      +----------+------------+---------+-----------+----------+-------+ Radial        Full                                           +----------+------------+---------+-----------+----------+-------+ Ulnar         Full                                          +----------+------------+---------+-----------+----------+-------+ Cephalic      None                                   Acute  +----------+------------+---------+-----------+----------+-------+ Basilic       Full                                          +----------+------------+---------+-----------+----------+-------+  Left Findings: +----------+------------+---------+-----------+----------+-------+ LEFT      CompressiblePhasicitySpontaneousPropertiesSummary +----------+------------+---------+-----------+----------+-------+ Subclavian    Full       Yes       Yes                      +----------+------------+---------+-----------+----------+-------+  Summary:  Right: No evidence of deep vein thrombosis in the upper extremity. Findings consistent with acute superficial vein thrombosis involving the right cephalic vein.  Left: No evidence of thrombosis in the subclavian.  *See table(s) above for measurements and observations.  Diagnosing physician: Monica Martinez MD Electronically signed by Monica Martinez MD on 04/01/2020 at 5:57:42 PM.    Final    [place] EKG SITE RITE  Result Date: 04/21/2020 If Site Rite image not attached, placement could not be confirmed due to current cardiac rhythm.  [place] EKG SITE RITE  Result Date: 03/27/2020 If Site Rite image not attached, placement could not be confirmed due to current cardiac rhythm.  US Abdomen Limited RUQ (LIVER/GB)  Result Date: 04/05/2020 CLINICAL DATA:  DKA, sepsis, respiratory failure, anemia and coagulopathy. EXAM: ULTRASOUND ABDOMEN LIMITED RIGHT UPPER QUADRANT COMPARISON:  None. FINDINGS: Gallbladder: The gallbladder is contracted around at least a single shadowing calculus. Common bile duct: Diameter: 3 mm Liver: No focal lesion identified. Within normal limits in parenchymal  echogenicity. Portal vein is patent on color Doppler imaging with normal direction of blood flow towards the liver. Other: No ascites visualized in the right upper quadrant. IMPRESSION: 1. Cholelithiasis with contracted gallbladder around at least a single gallstone. 2. Normal sonographic appearance of the liver. Electronically Signed  By: Aletta Edouard M.D.   On: 04/05/2020 09:08    Labs:  CBC: Recent Labs    04/14/20 0529 04/15/20 0608 04/20/20 0501 04/20/20 1528 04/21/20 0241  WBC 12.3* 11.6* 13.4*  --  17.5*  HGB 6.8* 8.3* 8.4*  --  7.6*  HCT 21.9* 26.0* 29.3*  --  27.1*  PLT 173 180 227 212 295    COAGS: Recent Labs    04/13/2020 1841 04/20/20 0930 04/20/20 1528 04/21/20 0241 04/21/20 1135  INR 1.3* 9.6* >10.0* 6.0* 2.9*  APTT 53* 156* 156* 37*  --     BMP: Recent Labs    04/19/20 0622 04/20/20 0501 04/20/20 1313 04/21/20 0241  NA 140 140 140 141  K 3.6 3.4* 3.4* 4.6  CL 110 107 108 110  CO2 16* 11* 13* 10*  GLUCOSE 105* 151* 117* 113*  BUN 58* 60* 60* 62*  CALCIUM 11.1* 11.3* 11.3* 11.3*  CREATININE 2.65* 2.83* 2.91* 2.86*  GFRNONAA 20* 18* 17* 18*    LIVER FUNCTION TESTS: Recent Labs    04/18/20 0605 04/19/20 0622 04/20/20 0501 04/21/20 0241  BILITOT 0.6 0.4 0.7 1.0  AST 10* 10* 9* 13*  ALT <5 <5 <5 <5  ALKPHOS 73 77 77 68  PROT 6.2* 6.3* 6.2* 5.7*  ALBUMIN 1.5* 1.6* 1.6* 1.9*    TUMOR MARKERS: No results for input(s): AFPTM, CEA, CA199, CHROMGRNA in the last 8760 hours.  Assessment and Plan: Possible pancreatitis despite normal LFTs and lipase. Intra-abdominal collections of uncertain etiology at this time. Underlying infectious process with her recent bursitis/osteo. Feel aspiration/drainage of multiple collections would serve some utility. Hopefully if coagulopathy is corrected and pt more stable(off pressors), IR can bring down for image guided aspiration/drainage. Discussed procedure with pt in detail. She's a bit sleepy right now,  so did not have her sign consent.  Will reassess in am and if appropriate for procedure, will rediscuss and obtain consent.  Thank you for this interesting consult.  I greatly enjoyed meeting LAKEENA DOWNIE and look forward to participating in their care.  A copy of this report was sent to the requesting provider on this date.  Electronically Signed: Ascencion Dike, PA-C 04/21/2020, 1:24 PM   I spent a total of 20 minutes in face to face in clinical consultation, greater than 50% of which was counseling/coordinating care for abd fluid collections

## 2020-04-21 NOTE — Progress Notes (Signed)
Brief Progress Note Spoke with nursing. Pt has not been taking sodium bicarb tabs due to nausea/ vomiting Plan Will start Bicarb gtt  Decreased UO Plan Will place foley cath  Remains on Phenylphrine gtt at 70 mcg Plan Will order PICC as patient will need long term antibiotics Wean as able for MAP > 65  Nursing shared there has been some vaginal bloody smearing ? Of vaginal fistula Plan Consider Gyn consult Continue to monitor for bleeding  Magdalen Spatz, MSN, AGACNP-BC [place] for personal pager PCCM on call pager [phone]

## 2020-04-21 NOTE — Progress Notes (Signed)
[place] Progress Note Patient Name: Deborah Figueroa DOB: [birth year] MRN: [id number]   Date of Service  04/21/2020  HPI/Events of Note  Hypotension - BP = 78/38 with MAP = 49. Albumin = 1.6. No CVL or CVP. No urine output since 7 PM.  eICU Interventions  Plan: 1. 25% Albumin 25 gm IV now. 2. Phenylephrine IV infusion. Titrate to MAP >= 65.      Intervention Category Major Interventions: Hypotension - evaluation and management  Nicolaas Savo Cornelia Copa 04/21/2020, 12:43 AM

## 2020-04-21 NOTE — Progress Notes (Signed)
Pharmacy consult: Hypercalcemia  Assessment:  Pharmacy consulted by Nephrology regarding feasibility of bisphosphonate to treat hypercalcemia in 9 yoF with multiple medical problems, including sepsis, MSSA bacteremia, spinal/elbow osteomyelitis, CKD3, and primary hyperparathyroidism with baseline corrected Ca of 11.5-12.5  Of note, patient responded well to a course of calcitonin injection + Zometa earlier this admission, which lowered calcium from > 15 to 8-10 mg/dL range, and lasted about 4 wks; corrected calcium has since risen above baseline levels  SCr peaked at 3.1 earlier this admission, but now trending up again, stabilizing around 2.8   Plan:  Pamidronate 60 mg IV x 1  Given patient's chronically elevated calcium, would only continue to treat if felt to be pathological  Reuel Boom, PharmD, BCPS [phone] 04/21/2020, 4:20 PM

## 2020-04-21 NOTE — Progress Notes (Addendum)
PROGRESS NOTE  Deborah Figueroa ZOX:[id number] DOB: 11/05/1955 DOA: 03/31/2020 PCP: Ann Held, DO  Brief History   65 year old woman admitted 1/8 with DKA, found to have sepsis in the setting of MSSA bacteremia secondary to left olecranon bursitis and osteomyelitis of the olecranon and proximal ulna, underwent incision, bone debridement and drainage per orthopedics, seen by infectious disease with plans for IV antibiotics for 6 weeks.  Developed acute respiratory failure secondary to pneumonia as well as blood loss anemia from epistaxis.  Underwent EGD and colonoscopy showing stercoral ulcer and gastric ulcer.  Imaging suggested lung mass concerning for malignancy, abnormal pancreatic mass, thyroid nodule.  MRI obtained however more suggestive of fluid and infection rather than mass. Discussed with neurosurgery, recommended continuing antibiotics, no operative intervention, no indication for IR drainage.  Continued on antibiotics for cervical and thoracic paravertebral infection, thoracic discitis and osteomyelitis.  2/7 developed hypotension, worsening AKI, metabolic acidosis concerning for developing sepsis and transferred to stepdown unit.  Later started on vasopressors for persistent hypotension.  Further imaging revealed acute pancreatitis versus intra-abdominal fluid collections.  Clinical condition remains guarded with multiple consultants involved.  A & P  Shock with associated AKI, acute anion gap metabolic acidosis, coagulopathy --Now on vasopressors, renal function may be stabilizing although she is still acidotic despite increased bicarb.  Etiology remains unclear lactic acid is within normal limits and clinically she does not appear septic and her mentation is stable.  Blood sugars are stable, no reason to suspect euglycemic DKA. --Although CT shows fluid collections and pancreatitis, she has no abdominal symptoms other than nausea and lipase is within normal limits.  Concern for  intra-abdominal infection. --Appreciate GI involvement, IR involvement.  Plan for IR procedure and drain placement when clinically improved.  Possible pancreatitis, intra-abdominal fluid collections of uncertain etiology.  LFTs and lipase unremarkable. --Concern for intra-abdominal infection.  Continue antibiotics per ID.  Plan as above to proceed with drain placement when more stable.  Coagulopathy of unclear etiology --Improved with high-dose vitamin K and FFP.  No signs of bleeding. --Continue management as per hematology.  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 --Continue antibiotics through minimum 2/22. --Continue OT for triceps activation.  Follow-up with [facility] as an outpatient.  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  Discussed with neurosurgery Dr. Reatha Armour, who recommended ongoing antibiotics, no role for surgical intervention or IR drainage. --Per ID considering prolonged length of therapy to 8 weeks and oral antibiotics after that.  Repeat imaging in 8 weeks. --Consider lumbar MRI when stable  Lung abscess right apex.  Discussed with pulmonology 2/5.  Recommends antibiotics, repeat study in the outpatient setting to ensure resolution.  Could consider IR aspiration. --No hypoxia and no pulmonary symptoms.  Repeat chest x-ray without change. --Reimage in the future to ensure resolution.  AKI superimposed on CKD stage IIIb -baseline unclear, but probably around 1.7.  Renal function hopefully has peaked at this time.  Urine output relatively low.  Nutrition --Remain n.p.o.  As per GI, no NG tube. --PICC line.  May need TPN.  Primary hyperparathyroidism with hypercalcemia.  Followed by endocrinology as an outpatient, noted to be a candidate for surgery prior to admission.   --Follow-up with ENT after completion of treatment for  bacteremia. --Calcium trending back up.  Treated with calcitonin.  Nephrology considering bisphosphonate.  Diabetes mellitus type 2 with hemoglobin A1c 10.4. --blood sugars remain  stable.  Generalized weakness with recurrent falls in the setting of hypercalcemia, bacteremia and DKA.  MRI brain negative. --unsafe d/c, cannot care for self, no support, homeless, no insurance. No bed offers from SNF.  Difficult to place.  Left-sided thyroid nodule minimally suppressed TSH with low free T3 and T4 within normal limits. --Recommend outpatient ultrasound, however given difficult disposition, will obtain in-house  Acute on chronic normocytic anemia, anemia of critical illness.  Hemoccult positive but no overt GI bleed.  Earlier in hospitalization had epistaxis and coagulopathy, gum bleeding. Has required multiple transfusions. EGD/colonoscopy unrevealing. Hematology without specific recs at this point. Bone marrow suppression suspected as well. --Will trend hemoglobin.  Transfuse as needed.  Lower extremity edema secondary to hypoalbuminemia.  Echocardiogram was unremarkable. --Continue TED hose.  Aortic atherosclerosis --No treatment indicated.  Stercoral ulcer, fecal impaction noted 1/28.  Asymptomatic.  Continue aggressive bowel regimen recommended to keep stool soft and prevent recurrence of fecal impaction.   --Asymptomatic.  Continue bowel regimen.  Rectovaginal fistula?.  Stool noted from the vagina?.  Consider GYN evaluation as outpatient.  Not clear whether this is actual. --Follow-up as an outpatient.  Appears to be asymptomatic at this time.  RESOLVED . Sepsis secondary to MSSA bacteremia  . DKA . Severe hypercalcemia superimposed on chronic hypercalcemia secondary to primary hyperparathyroidism. Followed by endocrinology as an outpatient. Treated with bisphosphonate and calcitonin. . Acute hypoxemic respiratory failure with uncompensated respiratory acidosis secondary to bilateral  lower lobe pneumonia . Acute metabolic encephalopathy.  MRI brain no acute finding. . Oropharyngeal thrush   Nutritional Assessment: Body mass index is 31.21 kg/m.Marland Kitchen Seen by dietician.  I agree with the assessment and plan as outlined below: Nutrition Status: Nutrition Problem: Increased nutrient needs Etiology: acute illness (sepsis) Signs/Symptoms: estimated needs Interventions: Prostat,MVI,Glucerna shake  Disposition Plan:  Discussion: remains critically ill  Status is: Inpatient  Remains inpatient appropriate because:Hemodynamically unstable, Persistent severe electrolyte disturbances, Ongoing diagnostic testing needed not appropriate for outpatient work up, IV treatments appropriate due to intensity of illness or inability to take PO and Inpatient level of care appropriate due to severity of illness   Dispo: The patient is from: Home              Anticipated d/c is to: uncertain              Anticipated d/c date is: > 3 days              Patient currently is not medically stable to d/c.   Difficult to place patient Yes  DVT prophylaxis: Place TED hose Start: 04/19/20 1604 SCDs Start: 03/20/2020 1608   Code Status: Full Code Level of care: Stepdown Family Communication: updated son by telephone  Murray Hodgkins, MD  [facility] Direct contact: see [email or web address] (further directions at bottom of note if needed) 7PM-7AM contact night coverage as at bottom of note 04/21/2020, 3:02 PM  LOS: 31 days   Significant Hospital Events   . 1/8 admit for DKA, falls, AKI, hypercalcemia . 1/9 MSSA bacteremia, ID consult, ortho consult for elbow infection . 1/10 I&D . 1/13 ID signs off . 1/25 GI consult for anemia . 2/7 hypotension, shock transferred to stepdown   Consults:  . ID . Ortho . GI . PCCM . Nephrology . Hematology . PMD   Procedures:  . 1/10 I&D left septic olecranon bursitis including osteo of the olecranon . 1/28 colonoscopy.  Fecal impaction with associated  stercoral ulcer.  Manual disimpaction.  Polyp. Marland Kitchen  1/28 EGD.  Linear esophageal ulcer, nonbleeding gastric ulcer, erosive gastropathy, nonbleeding duodenal ulcers  Significant Diagnostic Tests:  . 1/11 TEE no endocarditis  Interval History/Subjective  CC: f/u infection  Hypotensive overnight, started on pressors and given albumin.  This morning she feels some nausea and like vomiting but no abdominal pain.  Breathing well.  Increased swelling in her arm.  Objective   Vitals:  Vitals:   04/21/20 1445 04/21/20 1452  BP: 118/60 121/62  Pulse: 96 93  Resp: 15 19  Temp:  (!) 97.4 F (36.3 C)  SpO2: 97% 98%    Exam: Constitutional:   . Appears calm and comfortable, ill but not toxic ENMT:  . grossly normal hearing  Respiratory:  . CTA bilaterally, no w/r/r.  . Respiratory effort normal.  Cardiovascular:  . RRR, no m/r/g . 2+ BLE extremity edema   . 2+ RUE edema Abdomen:  . Soft ntnd Musculoskeletal:  . RUE, LUE, RLE, LLE   . Moves all extremities to command Neurologic:  . Grossly nonfocal Psychiatric:  . Mental status o Mood, affect appropriate . Oriented to location, month, year   I have personally reviewed the following:   Today's Data   Creatinine slightly better at 2.86.  CO2 down to 10.  Calcium without change 11.3.  Magnesium within normal limits.  Albumin up to 1.9.  Lipase within normal limits and LFTs unremarkable.  Lactic acid within normal limits as well as troponin.  Hemoglobin trending down, slightly lower at 7.6.  WBC up to 17.5.  INR improved, now 2.9  Hepatitis panel was negative  Urinalysis was equivocal  Right upper extremity venous Doppler negative for DVT.  Positive for superficial cephalic thrombus, this was seen previously.  Scheduled Meds: . (feeding supplement) PROSource Plus  30 mL Oral TID BM  . Chlorhexidine Gluconate Cloth  6 each Topical Daily  . hydrocortisone sod succinate (SOLU-CORTEF) inj  50 mg Intravenous Q6H  . insulin  aspart  0-15 Units Subcutaneous Q4H  . mouth rinse  15 mL Mouth Rinse BID  . nystatin  5 mL Oral QID  . nystatin   Topical TID  . pantoprazole (PROTONIX) IV  40 mg Intravenous Q12H  . phytonadione  10 mg Subcutaneous Daily  . sertraline  100 mg Oral Daily  . sodium bicarbonate  1,300 mg Oral QID   Continuous Infusions: . sodium chloride    . ceFEPime (MAXIPIME) IV Stopped (04/20/20 1540)  . metronidazole 500 mg (04/21/20 1458)  . phenylephrine (NEO-SYNEPHRINE) Adult infusion 10 mcg/min (04/21/20 1349)  .  sodium bicarbonate (isotonic) infusion in sterile water 125 mL/hr at 04/21/20 1349    Principal Problem:   MSSA bacteremia Active Problems:   Hypercalcemia   Depression, major, single episode, moderate (HCC)   DKA (diabetic ketoacidosis) (HCC)   Acute pain of right shoulder   Left elbow pain   Acute kidney injury ([place])   Weakness   Recurrent falls   Sepsis due to methicillin susceptible Staphylococcus aureus ([place])   Osteomyelitis of left elbow (HCC)   Osteomyelitis of left ulna (HCC)   Sepsis ([place])   Blood in the stool   Anemia due to GI blood loss   Coagulopathy (HCC)   Duodenal ulcer   Acute gastric ulcer without hemorrhage or perforation   Adenomatous polyp of ascending colon   Adenomatous polyp of descending colon   Stercoral ulcer of rectum   Fecal impaction (HCC)   Lung mass   Thyroid nodule   Pancreatic mass  Aortic atherosclerosis ([place])   Discitis   Vertebral osteomyelitis ([place])   Lung abscess ([place])   Shock ([place])   LOS: 31 days   How to contact the [facility] Attending or Consulting provider [place] or covering provider during after hours [place], for this patient?  1. Check the care team in [facility] and look for a) attending/consulting [facility] provider listed and b) the [facility] team listed 2. Log into [email or web address] and use [place]'s universal password to access. If you do not have the password, please contact the hospital operator. 3. Locate the [facility] provider you are  looking for under [facility] and page to a number that you can be directly reached. 4. If you still have difficulty reaching the provider, please page the [facility] (Director on Call) for the Hospitalists listed on amion for assistance.

## 2020-04-22 ENCOUNTER — Inpatient Hospital Stay (HOSPITAL_COMMUNITY): Payer: Medicaid Other

## 2020-04-22 DIAGNOSIS — A419 Sepsis, unspecified organism: Secondary | ICD-10-CM

## 2020-04-22 DIAGNOSIS — N1832 Chronic kidney disease, stage 3b: Secondary | ICD-10-CM

## 2020-04-22 DIAGNOSIS — K859 Acute pancreatitis without necrosis or infection, unspecified: Secondary | ICD-10-CM

## 2020-04-22 DIAGNOSIS — R6521 Severe sepsis with septic shock: Secondary | ICD-10-CM

## 2020-04-22 DIAGNOSIS — A4102 Sepsis due to Methicillin resistant Staphylococcus aureus: Secondary | ICD-10-CM

## 2020-04-22 DIAGNOSIS — Z515 Encounter for palliative care: Secondary | ICD-10-CM

## 2020-04-22 LAB — COMPREHENSIVE METABOLIC PANEL
ALT: <5 U/L (ref 0–44)
AST: 10 U/L — ABNORMAL LOW (ref 15–41)
Albumin: 1.6 g/dL — ABNORMAL LOW (ref 3.5–5.0)
Alkaline Phosphatase: 58 U/L (ref 38–126)
Anion gap: 20 — ABNORMAL HIGH (ref 5–15)
BUN: 59 mg/dL — ABNORMAL HIGH (ref 8–23)
CO2: 15 mmol/L — ABNORMAL LOW (ref 22–32)
Calcium: 11.5 mg/dL — ABNORMAL HIGH (ref 8.9–10.3)
Chloride: 111 mmol/L (ref 98–111)
Creatinine, Ser: 2.73 mg/dL — ABNORMAL HIGH (ref 0.44–1.00)
GFR, Estimated: 19 mL/min — ABNORMAL LOW (ref >60)
Glucose, Bld: 84 mg/dL (ref 70–99)
Potassium: 2.8 mmol/L — ABNORMAL LOW (ref 3.5–5.1)
Sodium: 146 mmol/L — ABNORMAL HIGH (ref 135–145)
Total Bilirubin: 0.7 mg/dL (ref 0.3–1.2)
Total Protein: 5.5 g/dL — ABNORMAL LOW (ref 6.5–8.1)

## 2020-04-22 LAB — BLOOD GAS, ARTERIAL
Acid-base deficit: 15.1 mmol/L — ABNORMAL HIGH (ref 0.0–2.0)
Bicarbonate: 10.1 mmol/L — ABNORMAL LOW (ref 20.0–28.0)
FIO2: 100
O2 Saturation: 99.8 %
Patient temperature: 98.6
pCO2 arterial: 21.7 mmHg — ABNORMAL LOW (ref 32.0–48.0)
pH, Arterial: 7.288 — ABNORMAL LOW (ref 7.350–7.450)
pO2, Arterial: 302 mmHg — ABNORMAL HIGH (ref 83.0–108.0)

## 2020-04-22 LAB — POCT I-STAT 7, (LYTES, BLD GAS, ICA,H+H)
Acid-base deficit: 17 mmol/L — ABNORMAL HIGH (ref 0.0–2.0)
Bicarbonate: 8 mmol/L — ABNORMAL LOW (ref 20.0–28.0)
Calcium, Ion: 1.48 mmol/L — ABNORMAL HIGH (ref 1.15–1.40)
HCT: 21 % — ABNORMAL LOW (ref 36.0–46.0)
Hemoglobin: 7.1 g/dL — ABNORMAL LOW (ref 12.0–15.0)
O2 Saturation: 92 %
Potassium: 3 mmol/L — ABNORMAL LOW (ref 3.5–5.1)
Sodium: 143 mmol/L (ref 135–145)
TCO2: 8 mmol/L — ABNORMAL LOW (ref 22–32)
pCO2 arterial: 16.4 mmHg — CL (ref 32.0–48.0)
pH, Arterial: 7.295 — ABNORMAL LOW (ref 7.350–7.450)
pO2, Arterial: 69 mmHg — ABNORMAL LOW (ref 83.0–108.0)

## 2020-04-22 LAB — BPAM FFP
Blood Product Expiration Date: 202202132359
Blood Product Expiration Date: 202202132359
Blood Product Expiration Date: 202202132359
ISSUE DATE / TIME: 202202081013
ISSUE DATE / TIME: 202202081151
ISSUE DATE / TIME: 202202081335
Unit Type and Rh: 600
Unit Type and Rh: 600
Unit Type and Rh: 6200

## 2020-04-22 LAB — PREPARE FRESH FROZEN PLASMA

## 2020-04-22 LAB — AMYLASE: Amylase: 19 U/L — ABNORMAL LOW (ref 28–100)

## 2020-04-22 LAB — BASIC METABOLIC PANEL
Anion gap: 26 — ABNORMAL HIGH (ref 5–15)
Anion gap: 24 — ABNORMAL HIGH (ref 5–15)
BUN: 52 mg/dL — ABNORMAL HIGH (ref 8–23)
BUN: 57 mg/dL — ABNORMAL HIGH (ref 8–23)
CO2: 9 mmol/L — ABNORMAL LOW (ref 22–32)
CO2: 14 mmol/L — ABNORMAL LOW (ref 22–32)
Calcium: 10.9 mg/dL — ABNORMAL HIGH (ref 8.9–10.3)
Calcium: 10.6 mg/dL — ABNORMAL HIGH (ref 8.9–10.3)
Chloride: 109 mmol/L (ref 98–111)
Chloride: 105 mmol/L (ref 98–111)
Creatinine, Ser: 3.02 mg/dL — ABNORMAL HIGH (ref 0.44–1.00)
Creatinine, Ser: 2.85 mg/dL — ABNORMAL HIGH (ref 0.44–1.00)
GFR, Estimated: 17 mL/min — ABNORMAL LOW (ref >60)
GFR, Estimated: 18 mL/min — ABNORMAL LOW (ref >60)
Glucose, Bld: 132 mg/dL — ABNORMAL HIGH (ref 70–99)
Glucose, Bld: 164 mg/dL — ABNORMAL HIGH (ref 70–99)
Potassium: 3.4 mmol/L — ABNORMAL LOW (ref 3.5–5.1)
Potassium: 2.6 mmol/L — CL (ref 3.5–5.1)
Sodium: 144 mmol/L (ref 135–145)
Sodium: 143 mmol/L (ref 135–145)

## 2020-04-22 LAB — GLUCOSE, CAPILLARY
Glucose-Capillary: 116 mg/dL — ABNORMAL HIGH (ref 70–99)
Glucose-Capillary: 125 mg/dL — ABNORMAL HIGH (ref 70–99)
Glucose-Capillary: 140 mg/dL — ABNORMAL HIGH (ref 70–99)
Glucose-Capillary: 141 mg/dL — ABNORMAL HIGH (ref 70–99)
Glucose-Capillary: 72 mg/dL (ref 70–99)
Glucose-Capillary: 93 mg/dL (ref 70–99)
Glucose-Capillary: 98 mg/dL (ref 70–99)

## 2020-04-22 LAB — CBC WITH DIFFERENTIAL/PLATELET
Abs Immature Granulocytes: 0.15 10*3/uL — ABNORMAL HIGH (ref 0.00–0.07)
Basophils Absolute: 0 10*3/uL (ref 0.0–0.1)
Basophils Relative: 0 %
Eosinophils Absolute: 0 10*3/uL (ref 0.0–0.5)
Eosinophils Relative: 0 %
HCT: 20.7 % — ABNORMAL LOW (ref 36.0–46.0)
Hemoglobin: 6.3 g/dL — CL (ref 12.0–15.0)
Immature Granulocytes: 2 %
Lymphocytes Relative: 3 %
Lymphs Abs: 0.2 10*3/uL — ABNORMAL LOW (ref 0.7–4.0)
MCH: 28 pg (ref 26.0–34.0)
MCHC: 30.4 g/dL (ref 30.0–36.0)
MCV: 92 fL (ref 80.0–100.0)
Monocytes Absolute: 0.4 10*3/uL (ref 0.1–1.0)
Monocytes Relative: 4 %
Neutro Abs: 8.2 10*3/uL — ABNORMAL HIGH (ref 1.7–7.7)
Neutrophils Relative %: 91 %
Platelets: 178 10*3/uL (ref 150–400)
RBC: 2.25 MIL/uL — ABNORMAL LOW (ref 3.87–5.11)
RDW: 19.8 % — ABNORMAL HIGH (ref 11.5–15.5)
WBC: 8.9 10*3/uL (ref 4.0–10.5)
nRBC: 0 % (ref 0.0–0.2)

## 2020-04-22 LAB — PHOSPHORUS: Phosphorus: 5.7 mg/dL — ABNORMAL HIGH (ref 2.5–4.6)

## 2020-04-22 LAB — URINE CULTURE: Culture: <10000 — AB

## 2020-04-22 LAB — CBC
HCT: 26 % — ABNORMAL LOW (ref 36.0–46.0)
Hemoglobin: 8.1 g/dL — ABNORMAL LOW (ref 12.0–15.0)
MCH: 28.7 pg (ref 26.0–34.0)
MCHC: 31.2 g/dL (ref 30.0–36.0)
MCV: 92.2 fL (ref 80.0–100.0)
Platelets: 310 10*3/uL (ref 150–400)
RBC: 2.82 MIL/uL — ABNORMAL LOW (ref 3.87–5.11)
RDW: 18.6 % — ABNORMAL HIGH (ref 11.5–15.5)
WBC: 19.4 10*3/uL — ABNORMAL HIGH (ref 4.0–10.5)
nRBC: 0.3 % — ABNORMAL HIGH (ref 0.0–0.2)

## 2020-04-22 LAB — APTT: aPTT: 55 seconds — ABNORMAL HIGH (ref 24–36)

## 2020-04-22 LAB — MAGNESIUM: Magnesium: 1.7 mg/dL (ref 1.7–2.4)

## 2020-04-22 LAB — PROTIME-INR
INR: 1.7 — ABNORMAL HIGH (ref 0.8–1.2)
Prothrombin Time: 19.2 seconds — ABNORMAL HIGH (ref 11.4–15.2)

## 2020-04-22 LAB — HEMOGLOBIN AND HEMATOCRIT, BLOOD
HCT: 20.1 % — ABNORMAL LOW (ref 36.0–46.0)
Hemoglobin: 6.4 g/dL — CL (ref 12.0–15.0)

## 2020-04-22 LAB — LACTIC ACID, PLASMA: Lactic Acid, Venous: 1.7 mmol/L (ref 0.5–1.9)

## 2020-04-22 LAB — LIPASE, BLOOD: Lipase: 60 U/L — ABNORMAL HIGH (ref 11–51)

## 2020-04-22 LAB — PREPARE RBC (CROSSMATCH)

## 2020-04-22 MED ORDER — SODIUM CHLORIDE 0.9% IV SOLUTION: Freq: Once | INTRAVENOUS | Status: DC

## 2020-04-22 MED ORDER — ETOMIDATE 2 MG/ML IV SOLN: 20.0000 mg | Freq: Once | INTRAVENOUS | Status: AC

## 2020-04-22 MED ORDER — POTASSIUM CHLORIDE 10 MEQ/100ML IV SOLN
10.0000 meq | INTRAVENOUS | Status: AC
Start: 2020-04-22 — End: 2020-04-22
  Administered 2020-04-22 (×4): 10 meq via INTRAVENOUS
  Filled 2020-04-22 (×3): qty 100

## 2020-04-22 MED ORDER — MAGNESIUM SULFATE 2 GM/50ML IV SOLN
2.0000 g | Freq: Once | INTRAVENOUS | Status: AC
  Administered 2020-04-22: 2 g via INTRAVENOUS
  Filled 2020-04-22: qty 50

## 2020-04-22 MED ORDER — ROCURONIUM BROMIDE 10 MG/ML (PF) SYRINGE
PREFILLED_SYRINGE | INTRAVENOUS | Status: AC
  Administered 2020-04-22: 100 mg via INTRAVENOUS
  Filled 2020-04-22: qty 10

## 2020-04-22 MED ORDER — FENTANYL CITRATE (PF) 100 MCG/2ML IJ SOLN
50.0000 ug | Freq: Once | INTRAMUSCULAR | Status: AC
Start: 2020-04-22 — End: 2020-04-22

## 2020-04-22 MED ORDER — SODIUM CHLORIDE 0.9% FLUSH
10.0000 mL | Freq: Two times a day (BID) | INTRAVENOUS | Status: DC
  Administered 2020-04-22 – 2020-04-24 (×5): 10 mL

## 2020-04-22 MED ORDER — INSULIN ASPART 100 UNIT/ML ~~LOC~~ SOLN
0.0000 [IU] | SUBCUTANEOUS | Status: DC
  Administered 2020-04-22 – 2020-04-24 (×9): 1 [IU] via SUBCUTANEOUS

## 2020-04-22 MED ORDER — FENTANYL CITRATE (PF) 100 MCG/2ML IJ SOLN
50.0000 ug | INTRAMUSCULAR | Status: DC | PRN
  Administered 2020-04-23: 100 ug via INTRAVENOUS
  Filled 2020-04-22: qty 2

## 2020-04-22 MED ORDER — SODIUM CHLORIDE 0.9% IV SOLUTION: Freq: Once | INTRAVENOUS | Status: AC

## 2020-04-22 MED ORDER — FENTANYL CITRATE (PF) 100 MCG/2ML IJ SOLN
50.0000 ug | INTRAMUSCULAR | Status: DC | PRN
  Administered 2020-04-22 – 2020-04-23 (×2): 50 ug via INTRAVENOUS
  Filled 2020-04-22 (×2): qty 2

## 2020-04-22 MED ORDER — SODIUM BICARBONATE 8.4 % IV SOLN
INTRAVENOUS | Status: AC
  Filled 2020-04-22: qty 50

## 2020-04-22 MED ORDER — FUROSEMIDE 10 MG/ML IJ SOLN
40.0000 mg | Freq: Once | INTRAMUSCULAR | Status: AC
  Administered 2020-04-22: 40 mg via INTRAVENOUS
  Filled 2020-04-22: qty 4

## 2020-04-22 MED ORDER — ROCURONIUM BROMIDE 10 MG/ML (PF) SYRINGE: 100.0000 mg | PREFILLED_SYRINGE | Freq: Once | INTRAVENOUS | Status: AC

## 2020-04-22 MED ORDER — NOREPINEPHRINE 4 MG/250ML-% IV SOLN
2.0000 ug/min | INTRAVENOUS | Status: DC
  Administered 2020-04-22: 1 ug/min via INTRAVENOUS
  Administered 2020-04-22: 2 ug/min via INTRAVENOUS
  Filled 2020-04-22 (×2): qty 250

## 2020-04-22 MED ORDER — SODIUM BICARBONATE 650 MG PO TABS
1300.0000 mg | ORAL_TABLET | Freq: Three times a day (TID) | ORAL | Status: AC
  Administered 2020-04-22 – 2020-04-24 (×4): 1300 mg
  Filled 2020-04-22 (×4): qty 2

## 2020-04-22 MED ORDER — ETOMIDATE 2 MG/ML IV SOLN
INTRAVENOUS | Status: AC
  Administered 2020-04-22: 20 mg via INTRAVENOUS
  Filled 2020-04-22: qty 20

## 2020-04-22 MED ORDER — CHLORHEXIDINE GLUCONATE 0.12% ORAL RINSE (MEDLINE KIT)
15.0000 mL | Freq: Two times a day (BID) | OROMUCOSAL | Status: DC
  Administered 2020-04-22 – 2020-04-23 (×2): 15 mL via OROMUCOSAL

## 2020-04-22 MED ORDER — DOCUSATE SODIUM 50 MG/5ML PO LIQD
100.0000 mg | Freq: Two times a day (BID) | ORAL | Status: DC
  Administered 2020-04-22 – 2020-04-24 (×4): 100 mg
  Filled 2020-04-22 (×5): qty 10

## 2020-04-22 MED ORDER — FENTANYL CITRATE (PF) 100 MCG/2ML IJ SOLN
INTRAMUSCULAR | Status: AC
  Administered 2020-04-22: 50 ug via INTRAVENOUS
  Filled 2020-04-22: qty 2

## 2020-04-22 MED ORDER — POLYETHYLENE GLYCOL 3350 17 G PO PACK
17.0000 g | PACK | Freq: Every day | ORAL | Status: DC
  Administered 2020-04-22 – 2020-04-24 (×3): 17 g
  Filled 2020-04-22 (×3): qty 1

## 2020-04-22 MED ORDER — SODIUM CHLORIDE 0.9% FLUSH
10.0000 mL | INTRAVENOUS | Status: DC | PRN
Start: 2020-04-22 — End: 2020-04-25

## 2020-04-22 MED ORDER — ALBUMIN HUMAN 25 % IV SOLN
50.0000 g | Freq: Once | INTRAVENOUS | Status: AC
  Administered 2020-04-22: 50 g via INTRAVENOUS
  Filled 2020-04-22: qty 200

## 2020-04-22 MED ORDER — MIDAZOLAM HCL 2 MG/2ML IJ SOLN
INTRAMUSCULAR | Status: AC
  Administered 2020-04-22: 2 mg via INTRAVENOUS
  Filled 2020-04-22: qty 4

## 2020-04-22 MED ORDER — MIDAZOLAM HCL 2 MG/2ML IJ SOLN: 2.0000 mg | Freq: Once | INTRAMUSCULAR | Status: AC

## 2020-04-22 MED ORDER — ORAL CARE MOUTH RINSE
15.0000 mL | OROMUCOSAL | Status: DC
  Administered 2020-04-22 – 2020-04-23 (×7): 15 mL via OROMUCOSAL

## 2020-04-22 MED ORDER — DEXMEDETOMIDINE HCL IN NACL 200 MCG/50ML IV SOLN
0.0000 ug/kg/h | INTRAVENOUS | Status: DC
  Administered 2020-04-22: 0.6 ug/kg/h via INTRAVENOUS
  Administered 2020-04-22: 0.4 ug/kg/h via INTRAVENOUS
  Administered 2020-04-22: 0.6 ug/kg/h via INTRAVENOUS
  Administered 2020-04-23 (×2): 0.4 ug/kg/h via INTRAVENOUS
  Administered 2020-04-23: 0.6 ug/kg/h via INTRAVENOUS
  Administered 2020-04-23 – 2020-04-24 (×2): 0.8 ug/kg/h via INTRAVENOUS
  Administered 2020-04-24: 0.9 ug/kg/h via INTRAVENOUS
  Administered 2020-04-24: 0.8 ug/kg/h via INTRAVENOUS
  Administered 2020-04-24: 0.9 ug/kg/h via INTRAVENOUS
  Administered 2020-04-24 (×2): 0.8 ug/kg/h via INTRAVENOUS
  Administered 2020-04-24 (×2): 0.9 ug/kg/h via INTRAVENOUS
  Filled 2020-04-22 (×8): qty 50
  Filled 2020-04-22: qty 100
  Filled 2020-04-22 (×7): qty 50

## 2020-04-22 MED ORDER — SODIUM BICARBONATE 8.4 % IV SOLN
100.0000 meq | Freq: Once | INTRAVENOUS | Status: AC
  Administered 2020-04-22: 100 meq via INTRAVENOUS
  Filled 2020-04-22: qty 50

## 2020-04-22 MED ORDER — POTASSIUM CHLORIDE 10 MEQ/50ML IV SOLN
10.0000 meq | INTRAVENOUS | Status: AC
  Administered 2020-04-22 – 2020-04-23 (×6): 10 meq via INTRAVENOUS
  Filled 2020-04-22 (×6): qty 50

## 2020-04-22 NOTE — Progress Notes (Signed)
[facility] Progress Note  Subjective: seen in ICU, pt lethargic  Vitals:   04/22/20 1000 04/22/20 1035 04/22/20 1200 04/22/20 1400  BP: (!) 106/44 (!) 107/43 (!) 98/35 (!) 111/47  Pulse: 99 99 (!) 106 (!) 115  Resp: '20 20 18 17  ' Temp:  (!) 97.5 F (36.4 C) (!) 97.2 F (36.2 C)   TempSrc:  Axillary Axillary   SpO2: 97% 97% 100% 98%  Weight:      Height:        Exam: Gen lethargic No jvd or bruits Chest clear bilat to bases RRR no MRG Abd soft ntnd no mass or ascites +bs GU foley w/ clear light yellow urine in good amts Ext diffuse dependent 2+ hip and UE edema Neuro is lethargic    Home meds:  - lipitor 40/ fenofibrate 160/ lisinopril - hctz 20-12.5 qd/ toprol xl 100 qd  - insulin glargein 10u qd/ glucotrol 2.5 qam  - zoloft 100 qd/  Robaxin 750 qid/ flexeril prn  - prn's/ vitamins/ supplements    Date                           Creat               eGFR ml/min   2008- 15                    0.7- 1.1   2016- 19                    1.1- 2.0            38- 48 stage IIIb             2020                          2.33                 22 stage IV   09/2019                       1.88                 30   10/2019                       2.06                 28           Mar 19, 2020               2.17                 23   Jan 15                       1.76                 32   Jan 20                       3.08                 16   Jan 31                       2.29  23    Feb 6                        2.65                 20    Feb 7                        2.91                 18     CXR 2/01 - IMPRESSION: Progressive left lower lobe consolidation and left effusion. Mild right lower lobe airspace disease slightly improved.    CXR 2/07 - IMPRESSION: Bibasilar atelectasis or infiltrates are noted. Probable small left pleural effusion.     I/O lastl 1 week = 6.1 L in and 6.5 L out = net even     Wt 1/10 (admit 1/08) - 93kg      Wt 2/07 today - 101 kg         BP's normal on admit and up until the last 36 hrs BP's are in the mid 80's now    BN 60  Cr 2.9  K 3.4  CO2 13  AG 19  Lipase 40          UA 2/07 - cloudy, large LE, prot 30, rare bact/ 6-10 rbc/ >50 wbcs   UNa 37, UCr 66   Renal US 03/21/20 - R 10.6cm, L 10.7 cm, no hydro either side        Assessment/ Plan: 1. AKI on CKD IV - b/l creat 1.8- 2.0, eGFR 28- 30 from 2021. Creat here mostly in 1.8- 2.6 range. Peak creat 2.9 yesterday. Main issue shock, likely sepsis. Jamas Lav may be contributing. Creat down to 2.8 yest and 2.7 yesterday. Good UOP. Suspect ATN in recovery phase. BP's back to normal.  Pharm gave pamidronate x 1 on 2/8. Ca 10.6 not corrected.  Renal function a little better. Will follow.   2. Hypotension - sepsis most likely, on pressor x 1. Started on IV maxipime, flagyl. Blood cx's neg. CT abd shows fluid collections. IR consulting for possible drainage.  3. Volume - sig vol excess, will lower IVF's to 40 cc/hr.  4. DKA - resolved 5. L olecranon osteo - per pmd 6. MSSA bacteremia/ L olecranon osteo/ cervical and thoracic osteo+ discitis - on abx per primary team 7. Poor social support - reportedly is homeless. 8. Prognosis - pt has poor outlook overall, if gets sicker would not a good HD candidate. Would support EOL discussions, possible DNR, etc.     Rob Jonnie Finner 04/22/2020, 3:50 PM   Recent Labs  Lab 04/22/20 0504 04/22/20 1200 04/22/20 1450  K 2.8* 3.4* 3.0*  BUN 59* 52*  --   CREATININE 2.73* 3.02*  --   CALCIUM 11.5* 10.9*  --   PHOS 5.7*  --   --   HGB 6.3* 8.1* 7.1*   Inpatient medications: . (feeding supplement) PROSource Plus  30 mL Oral TID BM  . sodium chloride   Intravenous Once  . chlorhexidine gluconate (MEDLINE KIT)  15 mL Mouth Rinse BID  . Chlorhexidine Gluconate Cloth  6 each Topical Daily  . docusate  100 mg Per Tube BID  . hydrocortisone sod succinate (SOLU-CORTEF) inj  50 mg Intravenous Q6H  . insulin aspart  0-9 Units Subcutaneous Q4H  .  mouth rinse  15 mL Mouth Rinse 10 times  per day  . nystatin  5 mL Oral QID  . nystatin   Topical TID  . pantoprazole (PROTONIX) IV  40 mg Intravenous Q12H  . phytonadione  10 mg Subcutaneous Daily  . polyethylene glycol  17 g Per Tube Daily  . sertraline  100 mg Oral Daily  . sodium bicarbonate  100 mEq Intravenous Once  . sodium chloride flush  10-40 mL Intracatheter Q12H   . sodium chloride    . ceFEPime (MAXIPIME) IV Stopped (04/21/20 1631)  . dexmedetomidine (PRECEDEX) IV infusion    . metronidazole Stopped (04/22/20 1432)  . norepinephrine (LEVOPHED) Adult infusion 9 mcg/min (04/22/20 1332)  .  sodium bicarbonate (isotonic) infusion in sterile water 100 mL/hr at 04/22/20 1537   acetaminophen, dextrose, fentaNYL (SUBLIMAZE) injection, fentaNYL (SUBLIMAZE) injection, liver oil-zinc oxide, ondansetron **OR** ondansetron (ZOFRAN) IV, oxymetazoline, phenol, prochlorperazine, sodium chloride flush

## 2020-04-22 NOTE — Progress Notes (Signed)
[place] Progress Note Patient Name: Deborah Figueroa DOB: [birth year] MRN: [id number]   Date of Service  04/22/2020  HPI/Events of Note  Multiple issues: 1. Anemia - Hgb = 6.3 and 2. Hypokalemia - K+ = 2.8 and Creatinine = 2.73. Marland Kitchen   eICU Interventions  Plan: 1. Transfuse 1 unit PRBC now.  2. Cautiously replace K+. 3. Repeat BMP at 1 PM.     Intervention Category Major Interventions: Electrolyte abnormality - evaluation and management;Other:  Adalbert Alberto Cornelia Copa 04/22/2020, 5:50 AM

## 2020-04-22 NOTE — Progress Notes (Addendum)
INFECTIOUS DISEASE PROGRESS NOTE  ID: Deborah Figueroa is a 65 y.o. female with  Principal Problem:   MSSA bacteremia Active Problems:   Hypercalcemia   Depression, major, single episode, moderate (HCC)   DKA (diabetic ketoacidosis) (HCC)   Acute pain of right shoulder   Left elbow pain   Acute kidney injury ([place])   Weakness   Recurrent falls   Sepsis due to methicillin susceptible Staphylococcus aureus ([place])   Osteomyelitis of left elbow (HCC)   Osteomyelitis of left ulna (HCC)   Sepsis ([place])   Blood in the stool   Anemia due to GI blood loss   Coagulopathy (HCC)   Duodenal ulcer   Acute gastric ulcer without hemorrhage or perforation   Adenomatous polyp of ascending colon   Adenomatous polyp of descending colon   Stercoral ulcer of rectum   Fecal impaction (HCC)   Lung mass   Thyroid nodule   Pancreatic mass   Aortic atherosclerosis (HCC)   Discitis   Vertebral osteomyelitis (HCC)   Lung abscess (HCC)   Shock (HCC)  Subjective: Resting.   Abtx:  Anti-infectives (From admission, onward)   Start     Dose/Rate Route Frequency Ordered Stop   04/20/20 2200  metroNIDAZOLE (FLAGYL) IVPB 500 mg        500 mg 100 mL/hr over 60 Minutes Intravenous Every 8 hours 04/20/20 1432     04/20/20 1530  ceFEPIme (MAXIPIME) 2 g in sodium chloride 0.9 % 100 mL IVPB        2 g 200 mL/hr over 30 Minutes Intravenous Every 24 hours 04/20/20 1434     04/20/20 1500  piperacillin-tazobactam (ZOSYN) IVPB 3.375 g  Status:  Discontinued        3.375 g 12.5 mL/hr over 240 Minutes Intravenous Every 8 hours 04/20/20 1423 04/20/20 1429   04/20/20 1400  metroNIDAZOLE (FLAGYL) IVPB 500 mg  Status:  Discontinued        500 mg 100 mL/hr over 60 Minutes Intravenous Every 8 hours 04/20/20 1313 04/20/20 1421   04/20/20 1400  ceFEPIme (MAXIPIME) 2 g in sodium chloride 0.9 % 100 mL IVPB  Status:  Discontinued        2 g 200 mL/hr over 30 Minutes Intravenous Every 24 hours 04/20/20 1330 04/20/20  1356   04/19/20 2200  ceFAZolin (ANCEF) IVPB 2g/100 mL premix  Status:  Discontinued        2 g 200 mL/hr over 30 Minutes Intravenous Every 12 hours 04/19/20 1501 04/19/20 1513   04/19/20 1600  ceFAZolin (ANCEF) IVPB 2g/100 mL premix  Status:  Discontinued        2 g 200 mL/hr over 30 Minutes Intravenous Every 8 hours 04/19/20 1513 04/20/20 1316   04/17/20 1000  fluconazole (DIFLUCAN) IVPB 100 mg        100 mg 50 mL/hr over 60 Minutes Intravenous Every 24 hours 04/16/20 1229 04/20/20 1215   03/21/2020 1400  ceFAZolin (ANCEF) IVPB 2g/100 mL premix  Status:  Discontinued        2 g 200 mL/hr over 30 Minutes Intravenous Every 8 hours 03/31/2020 0735 04/19/20 1501   04/09/20 1300  fluconazole (DIFLUCAN) tablet 100 mg  Status:  Discontinued        100 mg Oral Daily 04/09/20 1222 04/16/20 1227   04/06/20 0000  ceFAZolin (ANCEF) IVPB        2 g Intravenous Every 8 hours 04/06/20 1604 05/16/20 2359   04/05/20 1800  ceFAZolin (ANCEF) IVPB  2g/100 mL premix  Status:  Discontinued        2 g 200 mL/hr over 30 Minutes Intravenous Every 12 hours 04/05/20 1345 04/08/2020 0735   04/05/20 1400  ceFAZolin (ANCEF) IVPB 2g/100 mL premix  Status:  Discontinued        2 g 200 mL/hr over 30 Minutes Intravenous Every 8 hours 04/05/20 1301 04/05/20 1345   04/02/20 0522  ceFEPIme (MAXIPIME) 2 g in sodium chloride 0.9 % 100 mL IVPB  Status:  Discontinued        2 g 200 mL/hr over 30 Minutes Intravenous Every 24 hours 04/01/20 0932 04/05/20 1215   03/31/20 1800  ceFEPIme (MAXIPIME) 2 g in sodium chloride 0.9 % 100 mL IVPB  Status:  Discontinued        2 g 200 mL/hr over 30 Minutes Intravenous Every 12 hours 03/31/20 1244 04/01/20 0932   03/30/20 1030  piperacillin-tazobactam (ZOSYN) IVPB 3.375 g  Status:  Discontinued        3.375 g 12.5 mL/hr over 240 Minutes Intravenous Every 8 hours 03/30/20 1006 03/31/20 1236   03/29/20 1300  fluconazole (DIFLUCAN) tablet 150 mg  Status:  Discontinued        150 mg Oral  Once  03/29/20 1201 04/09/20 1222   04/13/2020 1145  ceFAZolin (ANCEF) IVPB 2g/100 mL premix  Status:  Discontinued        2 g 200 mL/hr over 30 Minutes Intravenous Every 8 hours 03/28/2020 1055 03/30/20 0951   03/21/2020 1100  nafcillin 12 g in sodium chloride 0.9 % 500 mL continuous infusion  Status:  Discontinued        12 g 20.8 mL/hr over 24 Hours Intravenous Every 24 hours 04/10/2020 0949 03/27/2020 1055   03/22/20 1500  nafcillin 2 g in sodium chloride 0.9 % 100 mL IVPB  Status:  Discontinued        2 g 200 mL/hr over 30 Minutes Intravenous Every 4 hours 03/22/20 1354 04/02/2020 0949   03/22/20 1345  nafcillin injection 2 g  Status:  Discontinued        2 g Intravenous Every 4 hours 03/22/20 1344 03/22/20 1354   03/22/20 1300  ceFAZolin (ANCEF) IVPB 2g/100 mL premix  Status:  Discontinued        2 g 200 mL/hr over 30 Minutes Intravenous Every 8 hours 03/22/20 1257 03/22/20 1344      Medications:  Scheduled: . (feeding supplement) PROSource Plus  30 mL Oral TID BM  . sodium chloride   Intravenous Once  . Chlorhexidine Gluconate Cloth  6 each Topical Daily  . hydrocortisone sod succinate (SOLU-CORTEF) inj  50 mg Intravenous Q6H  . insulin aspart  0-9 Units Subcutaneous Q4H  . mouth rinse  15 mL Mouth Rinse BID  . nystatin  5 mL Oral QID  . nystatin   Topical TID  . pantoprazole (PROTONIX) IV  40 mg Intravenous Q12H  . phytonadione  10 mg Subcutaneous Daily  . sertraline  100 mg Oral Daily  . sodium chloride flush  10-40 mL Intracatheter Q12H    Objective: Vital signs in last 24 hours: Temp:  [96.7 F (35.9 C)-97.9 F (36.6 C)] 96.9 F (36.1 C) (02/09 0833) Pulse Rate:  [79-97] 96 (02/09 0833) Resp:  [13-25] 19 (02/09 0833) BP: (95-138)/(39-81) 108/39 (02/09 0833) SpO2:  [96 %-100 %] 99 % (02/09 0833)   General appearance: mild distress Resp: rhonchi anterior - bilateral Cardio: regularly irregular rhythm and systolic murmur: early systolic 2/6,  crescendo at 2nd left intercostal  space GI: normal findings: bowel sounds normal and soft, non-tender Extremities: edema anasarca  Lab Results Recent Labs    04/21/20 0241 04/22/20 0504  WBC 17.5* 8.9  HGB 7.6* 6.3*  HCT 27.1* 20.7*  NA 141 146*  K 4.6 2.8*  CL 110 111  CO2 10* 15*  BUN 62* 59*  CREATININE 2.86* 2.73*   Liver Panel Recent Labs    04/21/20 0241 04/22/20 0504  PROT 5.7* 5.5*  ALBUMIN 1.9* 1.6*  AST 13* 10*  ALT <5 <5  ALKPHOS 68 58  BILITOT 1.0 0.7   Sedimentation Rate No results for input(s): ESRSEDRATE in the last 72 hours. C-Reactive Protein No results for input(s): CRP in the last 72 hours.  Microbiology: Recent Results (from the past 240 hour(s))  Culture, blood (x 2)     Status: None (Preliminary result)   Collection Time: 04/20/20  9:17 AM   Specimen: BLOOD  Result Value Ref Range Status   Specimen Description   Final    BLOOD RIGHT ANTECUBITAL Performed at [facility] [address] [place], [place] [address]    Special Requests   Final    BOTTLES DRAWN AEROBIC ONLY Blood Culture adequate volume Performed at [facility] [address], [place], [place] [address]    Culture   Final    NO GROWTH 2 DAYS Performed at [facility] [address], [place], [place] [address]    Report Status PENDING  Incomplete  MRSA PCR Screening     Status: None   Collection Time: 04/20/20 11:06 AM   Specimen: Nasal Mucosa; Nasopharyngeal  Result Value Ref Range Status   MRSA by PCR NEGATIVE NEGATIVE Final    Comment:        The GeneXpert MRSA Assay (FDA approved for NASAL specimens only), is one component of a comprehensive MRSA colonization surveillance program. It is not intended to diagnose MRSA infection nor to guide or monitor treatment for MRSA infections. Performed at [facility], [place] [address], [place], [place] [address]   Culture, Urine     Status: Abnormal   Collection Time: 04/20/20 12:31 PM    Specimen: Urine, Clean Catch  Result Value Ref Range Status   Specimen Description   Final    URINE, CLEAN CATCH Performed at [facility], [place] [address], [place], [place] [address]    Special Requests   Final    NONE Performed at [facility], [place] [address], [place], [place] [address]    Culture (A)  Final    <10,000 COLONIES/mL INSIGNIFICANT GROWTH Performed at [facility] [address], [place], [place] [address]    Report Status 04/22/2020 FINAL  Final  Culture, blood (x 2)     Status: None (Preliminary result)   Collection Time: 04/20/20  1:13 PM   Specimen: BLOOD LEFT HAND  Result Value Ref Range Status   Specimen Description   Final    BLOOD LEFT HAND Performed at [facility] [address], [place], [place] [address]    Special Requests   Final    BOTTLES DRAWN AEROBIC ONLY Blood Culture adequate volume Performed at [facility] [address] [place], [place] [address]    Culture   Final    NO GROWTH 2 DAYS Performed at [facility] [address], [place], [place] [address]    Report  Status PENDING  Incomplete    Studies/Results: CT ABDOMEN PELVIS WO CONTRAST  Result Date: 04/20/2020 CLINICAL DATA:  65 year old female with nausea vomiting. EXAM: CT ABDOMEN AND PELVIS WITHOUT CONTRAST TECHNIQUE: Multidetector CT imaging of the abdomen and pelvis was performed following the standard protocol without IV contrast. COMPARISON:  Abdominal radiograph dated 04/20/2020. FINDINGS: Evaluation of this exam is limited in the absence of intravenous contrast. Lower chest: Small bilateral pleural effusions, left greater than right with associated partial consolidative changes of the lower lobes with air bronchogram which may represent atelectasis or infiltrate. Clinical correlation and follow-up recommended. Coronary vascular calcification. No intra-abdominal free air.  Trace free fluid in  the pelvis. Hepatobiliary: The liver is unremarkable. No intrahepatic biliary ductal dilatation. Multiple gallstones. No pericholecystic fluid or evidence of acute cholecystitis by CT Pancreas: There is nodular stranding surrounding the pancreas most consistent with acute pancreatitis. Correlation with pancreatic enzymes recommended. There is a 3 x 4 cm masslike prominence of the tail of the pancreas (29/2) which is not well visualized or evaluated on this noncontrast CT. Further evaluation of the pancreas with MRI without and with contrast or pancreatic protocol CT on a nonemergent/outpatient basis recommended. A 6.2 x 2.6 cm bilobed fluid attenuating density inferior to the pancreas most likely represents a pseudocyst. Additionally there is a 4.0 x 8.4 x 3.9 cm mixed density collection inferior to the spleen, likely an area of fat necrosis or developing infarct or pseudocyst. Scattered nodularity extending inferiorly from the tail of the pancreas may be sequela of pancreatitis although metastasis is not excluded in this patient with lung mass. Several nodular densities anterior to the distal left psoas muscle measuring up to 3.5 x 3.5 cm (64/2). There is a 5.0 x 3.5 cm cystic structure in the left lower abdomen (52/2) as well as a 5.8 x 3.0 cm low attenuating, fluid anterior to the distal psoas muscle (56/2) are not characterized but possibly pseudocysts. Spleen: The spleen is unremarkable. Adrenals/Urinary Tract: The adrenal glands unremarkable. There is no hydronephrosis or nephrolithiasis on either side. There is a 15 mm left renal interpolar hypodense lesion which is not well characterized but may represent a cyst. Focus of scarring and calcification in the inferior pole of the left kidney. The visualized ureters and urinary bladder appear unremarkable. Stomach/Bowel: There is no bowel obstruction or active inflammation. The appendix is normal. Vascular/Lymphatic: Moderate aortoiliac atherosclerotic disease.  The aorta is ectatic measuring up to 2.2 cm. The IVC is unremarkable. No portal venous gas. No adenopathy. Reproductive: The uterus is retroflexed.  No adnexal masses. Other: Diffuse subcutaneous edema.  No fluid collection. Musculoskeletal: Degenerative changes of the spine. L4-L5 disc spacer. No acute osseous pathology. IMPRESSION: 1. Acute pancreatitis. Correlation with pancreatic enzymes recommended. Multiple pancreatic pseudocysts as above. 2. Masslike prominence of the tail of the pancreas. Further evaluation of the pancreas with MRI without and with contrast or pancreatic protocol CT on a nonemergent/outpatient basis recommended. 3. Multiple nodular densities extending inferiorly from the tail of the pancreas may be sequela of pancreatitis although metastasis is not excluded in this patient with lung mass. 4. Small bilateral pleural effusions, left greater than right with bibasilar atelectasis or infiltrate. Clinical correlation and follow-up recommended. 5. No bowel obstruction. Normal appendix. 6. Aortic Atherosclerosis (ICD10-I70.0). Electronically Signed   By: Anner Crete M.D.   On: 04/20/2020 18:05   VAS [place] UPPER EXTREMITY VENOUS DUPLEX  Result Date: 04/20/2020 UPPER VENOUS STUDY  Indications: Ongoing edema RUE Comparison Study: Previous RUE  study on 04/01/20 was positive for Acute SVT of                   cephalic vein at [facility] fossa. Performing Technologist: Rogelia Rohrer  Examination Guidelines: A complete evaluation includes B-mode imaging, spectral Doppler, color Doppler, and power Doppler as needed of all accessible portions of each vessel. Bilateral testing is considered an integral part of a complete examination. Limited examinations for reoccurring indications may be performed as noted.  Right Findings: +----------+------------+---------+-----------+----------+--------------+ RIGHT     CompressiblePhasicitySpontaneousProperties   Summary      +----------+------------+---------+-----------+----------+--------------+ IJV           Full       Yes       Yes                             +----------+------------+---------+-----------+----------+--------------+ Subclavian    Full       Yes       Yes                             +----------+------------+---------+-----------+----------+--------------+ Axillary      Full       Yes       Yes                             +----------+------------+---------+-----------+----------+--------------+ Brachial      Full       Yes       Yes                             +----------+------------+---------+-----------+----------+--------------+ Radial        Full                                  Not visualized +----------+------------+---------+-----------+----------+--------------+ Ulnar         Full                                  Not visualized +----------+------------+---------+-----------+----------+--------------+ Cephalic      None       No        No                  Chronic     +----------+------------+---------+-----------+----------+--------------+ Basilic       Full       Yes       Yes                             +----------+------------+---------+-----------+----------+--------------+ 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 Findings for superficial vein thrombosis appear unchanged from previous study.  Left: No  evidence  of thrombosis in the subclavian.  *See table(s) above for measurements and observations.  Diagnosing physician: Jamelle Haring Electronically signed by Jamelle Haring on 04/20/2020 at 5:01:05 PM.    Final    [place] EKG SITE RITE  Result Date: 04/21/2020 If Site Rite image not attached, placement could not be confirmed due to current cardiac rhythm.    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  Possible IR aspirate this PM, her INR has improved (1.7) Not sure why WBC and Cr went up 10 over ~ 6 hours? 2u PRBC this am Will continue cefepime for now Discussed with CCM, will ask CV if a repeat TEE would be helpful.   Her BP has improved, on pressor (9)          Bobby Rumpf MD, FACP Infectious Diseases (pager) [phone] [email or web address] 04/22/2020, 10:14 AM  LOS: 32 days

## 2020-04-22 NOTE — Progress Notes (Signed)
Peripherally Inserted Central Catheter Placement  The IV Nurse has discussed with the patient and/or persons authorized to consent for the patient, the purpose of this procedure and the potential benefits and risks involved with this procedure.  The benefits include less needle sticks, lab draws from the catheter, and the patient may be discharged home with the catheter. Risks include, but not limited to, infection, bleeding, blood clot (thrombus formation), and puncture of an artery; nerve damage and irregular heartbeat and possibility to perform a PICC exchange if needed/ordered by physician.  Alternatives to this procedure were also discussed.  Bard Power PICC patient education guide, fact sheet on infection prevention and patient information card has been provided to patient /or left at bedside.    PICC Placement Documentation  PICC Triple Lumen 27/74/12 PICC Left Basilic 46 cm 0 cm (Active)  Exposed Catheter (cm) 0 cm 04/22/20 1127  Site Assessment Clean;Dry;Intact 04/22/20 1127  Lumen #1 Status Flushed;Saline locked;Blood return noted 04/22/20 1127  Lumen #2 Status Blood return noted;Flushed;Saline locked 04/22/20 1127  Lumen #3 Status Blood return noted;Flushed;Saline locked 04/22/20 1127  Dressing Type Transparent;Securing device 04/22/20 1127  Dressing Status Clean;Dry;Intact 04/22/20 1127  Antimicrobial disc in place? Yes 04/22/20 1127  Safety Lock Not Applicable 87/86/76 7209  Dressing Change Due 04/29/20 04/22/20 1127       Frances Maywood 04/22/2020, 11:29 AM

## 2020-04-22 NOTE — Progress Notes (Signed)
OT Cancellation Note  Patient Details Name: Deborah Figueroa MRN: [id number] DOB: 12/29/1955   Cancelled Treatment:    Reason Eval/Treat Not Completed: Medical issues which prohibited therapy. Patient intubated this afternoon. OT will sign off. Please re-order when patient medically appropriate for therapy.   [name] [name] 04/22/2020, 3:55 PM

## 2020-04-22 NOTE — Progress Notes (Addendum)
Patient ID: Deborah Figueroa, female   DOB: 02/20/1956, 65 y.o.   MRN: [id number] Pt awake but still a little lethargic; still on pressors; receiving blood now Vitals:   04/22/20 0800 04/22/20 0833  BP: (!) 95/39 (!) 108/39  Pulse: 92 96  Resp: 18 19  Temp: (!) 96.7 F (35.9 C) (!) 96.9 F (36.1 C)  SpO2: 98% 99%   WBC nl; hgb 6.3, plts nl; K 2.8, creat 2.73, PT 19.2, INR 1.7; imaging studies reviewed by Dr. Anselm Pancoast; risks and benefits of image guide abd fluid collection aspiration/drainage discussed with the patient/son Deborah Figueroa including bleeding, infection, damage to adjacent structures, bowel perforation/fistula connection, and sepsis.  All of the patient's questions were answered, patient/family are agreeable to proceed with procedure Consent signed and in chart. Will tent plan procedure for later today if pt remains stable/cleared by CCM

## 2020-04-22 NOTE — Procedures (Signed)
Intubation Procedure Note  Deborah Figueroa  [id number]  1955/11/11  Date:04/22/20  Time:4:04 PM   Provider Performing:Kerrion Kemppainen    Procedure: Intubation (31500)  Indication(s) Respiratory Failure  Consent Unable to obtain consent due to emergent nature of procedure.   Anesthesia Etomidate, Versed, Fentanyl and Rocuronium   Time Out Verified patient identification, verified procedure, site/side was marked, verified correct patient position, special equipment/implants available, medications/allergies/relevant history reviewed, required imaging and test results available.   Sterile Technique Usual hand hygeine, masks, and gloves were used   Procedure Description Patient positioned in bed supine.  Sedation given as noted above.  Patient was intubated with endotracheal tube using Mac blade 4.  View was Grade 1 full glottis .  Number of attempts was 1.  Colorimetric CO2 detector was consistent with tracheal placement.   Complications/Tolerance None; patient tolerated the procedure well. Chest X-ray is ordered to verify placement.   EBL Minimal   Specimen(s) None  [name] MD [facility] See Amion for pager  If no response to pager , please call [phone] until 7pm After 7:00 pm call Elink  [phone] 04/22/2020, 4:04 PM

## 2020-04-22 NOTE — Progress Notes (Signed)
CRITICAL VALUE ALERT  Critical Value: Hgb 6.4  Date & Time Notied:  2/9 @ 2048p  Provider Notified: Warren Lacy RN  Orders Received/Actions taken: Awaiting orders

## 2020-04-22 NOTE — Progress Notes (Signed)
PT Cancellation Note  Patient Details Name: Deborah Figueroa MRN: [id number] DOB: [birth year]   Cancelled Treatment:    Reason Eval/Treat Not Completed: Medical issues which prohibited therapy , intubated. Will follow.  Claretha Cooper 04/22/2020, 3:31 PM [place] Pager [phone] Office [phone]

## 2020-04-22 NOTE — Progress Notes (Addendum)
[facility] Progress Note  CC:  Acute pancreatitis, pancreatic mass  Subjective: She is more awake and conversant this morning. She had nausea last night, no vomiting. She has LUQ pain when turned to the left side otherwise no significant abdominal pain. Last BM was documented on 2/6. She is passing gas per the rectum. She remains NPO. No family at the bedside.    Objective:  Vital signs in last 24 hours: Temp:  [97.2 F (36.2 C)-97.9 F (36.6 C)] 97.8 F (36.6 C) (02/09 0400) Pulse Rate:  [79-97] 86 (02/09 0600) Resp:  [13-25] 15 (02/09 0600) BP: (102-138)/(43-81) 115/52 (02/09 0600) SpO2:  [96 %-100 %] 98 % (02/09 0600) Last BM Date: 04/19/20 General:  Critically ill appearing female. Alert.  Eyes: No scleral icterus. PERRLA.  Heart: RRR, no murmur.  Pulm:  Breath sounds clear, diminished in the bases bilaterally.  Abdomen: Soft, nondistended. Moderate LUQ tenderness radiates to left mid abdomen without rebound or guarding. No mass. Hypoactive BS x 4 quads.  Extremities:  Anasarca edema.  Neurologic:  Alert and  oriented x 4. Speech is clear. Answers questions appropriately. Moves all extremities weakly.  Psych:  Alert and cooperative. Normal mood and affect.   Lab Results: Recent Labs    04/20/20 0501 04/20/20 1528 04/21/20 0241 04/22/20 0504  WBC 13.4*  --  17.5* 8.9  HGB 8.4*  --  7.6* 6.3*  HCT 29.3*  --  27.1* 20.7*  PLT 227 212 295 178   BMET Recent Labs    04/20/20 1313 04/21/20 0241 04/22/20 0504  NA 140 141 146*  K 3.4* 4.6 2.8*  CL 108 110 111  CO2 13* 10* 15*  GLUCOSE 117* 113* 84  BUN 60* 62* 59*  CREATININE 2.91* 2.86* 2.73*  CALCIUM 11.3* 11.3* 11.5*   LFT Recent Labs    04/22/20 0504  PROT 5.5*  ALBUMIN 1.6*  AST 10*  ALT <5  ALKPHOS 58  BILITOT 0.7   PT/INR Recent Labs    04/21/20 1135 04/22/20 0504  LABPROT 29.7* 19.2*  INR 2.9* 1.7*   Hepatitis Panel Recent Labs    04/21/20 0618  HEPBSAG NON  REACTIVE  HCVAB NON REACTIVE  HEPAIGM NON REACTIVE  HEPBIGM NON REACTIVE    Assessment / Plan:  15.  65 year old female initially admitted to the hospital 1/8 with generalized weakness, DKA and MSSA sepsis secondary to left olecranon bursitis and osteomyelitis resulting in a prolonged complicated hospital course as noted in the HPI.  Intermittent nausea x 1 to 2 weeks with vomiting (bilious emesis) x 2 days. Some nausea overnight, no further vomiting.  CTAP 2/7 identified multiple gallstones, acute pancreatitis with possible pseudocyst and mass like prominence in the tail of the pancreas and multifocal fluid collections to the left lower abdomen and distal to the psoas muscle. Normal LFTs and lipase levels. No prior history of pancreatitis.  -Dr. Ardis Hughs reviewed her CTAP with radiology and IR with recommendations to aspirate 2 of the abdominal fluid collections when her INR is corrected and when she is hemodynamically stable. I spoke to IR PA-C this morning regarding patient's current status with INR is 1.7 today, Hg 6.3 receiving 1 unit of PRBCs this am, back on Neo gtt. IR tentatively planning to drain a few of her abdominal fluid collections later this afternoon if she remains hemodynamically stable and if cleared by critical care.  -Zofran 4 mg IV every 6 hours as needed -NPO for now, would not recommend po  intake or feeding tube placement at this time  -PPI IV QD -IV antibiotics per ID  2.  MSSA  bacteremia secondary to left olecranon bursitis and osteomyelitis s/p I & D on 04/04/2020 on IV antibiotics x 6 weeks per ID  3.  Coagulopathy, etiology unclear.  Possibly due to sepsis. INR 6.0. Received 3 packs of FFP on 2/8.  Today  INR 1.7. Followed by hematology.   4.  Anemia.  Hg 8.4 -> 7.6. Received a total of 6 units of PRBCs since admission. Today Hg 6.3 and 1 unit of PRBCs was ordered. FOBT + 1/20. S/P EGD and colonoscopy 04/09/2020 showed a nonbleeding linear esophageal ulcer, nonbleeding  gastric and duodenal ulcer and a stercoral ulcer, one TA polyp was removed from the ascending colon.  No overt signs of GI bleeding or recurrent epistaxis at this time.  5.  Respiratory  Failure, PNA, small bilateral pleural effusions, right apical lung mass/fluid collection (infectious vs inflammatory vs malignancy)  6.  Diabetes mellitus type 2, DKA resolved.  7.  AKI. Cr. 2.86 -> 2.73.   8.  Tubular adenomatous colon polyps per colonoscopy 03/29/2020 -Repeat colonoscopy in 1 year  9. Esophageal ulcer, gastric ulcer and duodenal ulcer per EGD 04/06/2020: -PPI IV QD  10. Questionable pathologic C7, T1 fracture of spine with fluid collections C6-T2 and C7-T1 concerning for spinal infection. T7-8 early discitis/osteomyelitis.    LOS: 32 days   Deborah Figueroa  04/22/2020, 8:03 AM   ________________________________________________________________________  Velora Heckler GI MD note:  I personally examined the patient, reviewed the data and agree with the assessment and plan described above.  Very complicated situation and unfortunately she is requiring increasing pressor support today.  INR has corrected to 1.7 with FFP, Vit K in past 24-36 hours. I am most suspicious that her issues are driven by sepsis.  There is proven or presumed infection in left elbow, osteo, right lung, spine.  I think there is certainly infection in her abdomen as well, causing multiple sites of fluid throughout the abdomen.  Hopefully IR will be able to sample at least one of the suspicious fluid collections.  She has required numerous RBC transfusions this admission, currently without any overt bleeding.  Should continue IV PPI daily given the ulcer disease noted on EGD 2 weeks ago.  Will follow along. Her prognosis seems quite guarded.   Owens Loffler, MD [facility] Pager [phone]

## 2020-04-22 NOTE — Progress Notes (Signed)
[facility] Progress Note Patient Name: Deborah Figueroa DOB: 25-Nov-1955 MRN: [id number]   Date of Service  04/22/2020  HPI/Events of Note  Anemia - Hgb = 6.4.  eICU Interventions  Transfuse 1 unit PRBC.     Intervention Category Major Interventions: Other:  Lysle Dingwall 04/22/2020, 9:06 PM

## 2020-04-22 NOTE — Progress Notes (Signed)
PROGRESS NOTE  Deborah Figueroa:[id number] DOB: 1955-05-20 DOA: 04/11/2020 PCP: Ann Held, DO  Brief History   65 year old woman admitted 1/8 with DKA, found to have sepsis in the setting of MSSA bacteremia secondary to left olecranon bursitis and osteomyelitis of the olecranon and proximal ulna, underwent incision, bone debridement and drainage per orthopedics, seen by infectious disease with plans for IV antibiotics for 6 weeks.  Developed acute respiratory failure secondary to pneumonia as well as blood loss anemia from epistaxis.  Underwent EGD and colonoscopy showing stercoral ulcer and gastric ulcer.  Imaging suggested lung mass concerning for malignancy, abnormal pancreatic mass, thyroid nodule.  MRI obtained however more suggestive of fluid and infection rather than mass. Discussed with neurosurgery, recommended continuing antibiotics, no operative intervention, no indication for IR drainage.  Continued on antibiotics for cervical and thoracic paravertebral infection, thoracic discitis and osteomyelitis.  2/7 developed hypotension, worsening AKI, metabolic acidosis concerning for developing sepsis and transferred to stepdown unit.  Later started on vasopressors for persistent hypotension.  Further imaging revealed acute pancreatitis versus intra-abdominal fluid collections.  Clinical condition remains guarded with multiple consultants involved.  Overnight 2/8 into early morning of 2/9: Back in shock, requiring vasopressors, anemia with hemoglobin 6.3-transfusing, hypokalemia.  Critical care kindly offered to take over patient onto their service, appreciate their assistance.  TRH will sign off 2/9  A & P  Shock (?  Septic shock) with associated AKI, acute anion gap metabolic acidosis, coagulopathy --Now on vasopressors, renal function may be stabilizing although she is still acidotic despite increased bicarb.  Etiology remains unclear lactic acid is within normal limits and  clinically she does not appear septic and her mentation is stable.  Blood sugars are stable, no reason to suspect euglycemic DKA. --Although CT shows fluid collections and pancreatitis, she has no abdominal symptoms other than nausea and lipase is within normal limits.  Concern for intra-abdominal infection. --Appreciate GI involvement, IR involvement.  Plan for IR procedure and drain placement when clinically improved. -As per discussion with night RN this morning, patient had been weaned off vasopressors until early this morning when developed hypotension/shock again and back on vasopressors-Neo-Synephrine at 75 MCG via PIV this morning. -As discussed above, critical care currently resumed primary care given vasopressor dependent shock.  They have changed phenylephrine to Levophed, continuing stress dose steroids, broad-spectrum IV antibiotics including cefepime and Flagyl, IV albumin and are considering TEE.  Possible pancreatitis, intra-abdominal fluid collections of uncertain etiology.  LFTs and lipase unremarkable. --Concern for intra-abdominal infection.  Continue antibiotics per ID.  Plan as above to proceed with drain placement when more stable.  Coagulopathy of unclear etiology --Improved with high-dose vitamin K and FFP.  No signs of bleeding. --Continue management as per hematology.  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 --Continue antibiotics through minimum 2/22. --Continue OT for triceps activation.  Follow-up with [facility] as an outpatient.  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  Discussed with neurosurgery Dr. Reatha Armour, who recommended ongoing antibiotics, no role for surgical intervention or IR drainage. --Per ID considering prolonged length of therapy to 8 weeks and oral antibiotics after that.  Repeat imaging in 8 weeks. --Consider  lumbar MRI when stable  Lung abscess right apex.  Discussed with pulmonology 2/5.  Recommends antibiotics, repeat study in the outpatient setting to ensure resolution.  Could consider IR aspiration. --No hypoxia and no pulmonary symptoms.  Repeat chest x-ray without change. --Reimage in the future to ensure resolution.  AKI superimposed on CKD stage IIIb -baseline unclear, but probably around 1.7.  Renal function hopefully has peaked at this time.  Urine output relatively low.  Nutrition --Remain n.p.o.  As per GI, no NG tube. --PICC line.  May need TPN.  Primary hyperparathyroidism with hypercalcemia.  Followed by endocrinology as an outpatient, noted to be a candidate for surgery prior to admission.   --Follow-up with ENT after completion of treatment for bacteremia. --Calcium trending back up.  Treated with calcitonin.  Nephrology considering bisphosphonate.  Diabetes mellitus type 2 with hemoglobin A1c 10.4. --blood sugars remain stable.  Generalized weakness with recurrent falls in the setting of hypercalcemia, bacteremia and DKA.  MRI brain negative. --unsafe d/c, cannot care for self, no support, homeless, no insurance. No bed offers from SNF.  Difficult to place.  Left-sided thyroid nodule minimally suppressed TSH with low free T3 and T4 within normal limits. --Recommend outpatient ultrasound, however given difficult disposition, will need to obtain in-house  Acute on chronic normocytic anemia, anemia of critical illness.  Hemoccult positive but no overt GI bleed.  Earlier in hospitalization had epistaxis and coagulopathy, gum bleeding. Has required multiple transfusions. EGD/colonoscopy unrevealing. Hematology without specific recs at this point. Bone marrow suppression suspected as well. --Will trend hemoglobin.  Transfuse as needed. Hemoglobin down to 6.3 in the absence of overt bleeding.  Transfusing a unit of PRBC 2/9.  Follow posttransfusion CBCs and transfuse for hemoglobin  of 7 g or less.  Lower extremity edema secondary to hypoalbuminemia.  Echocardiogram was unremarkable. --Continue TED hose.  Aortic atherosclerosis --No treatment indicated.  Stercoral ulcer, fecal impaction noted 1/28.  Asymptomatic.  Continue aggressive bowel regimen recommended to keep stool soft and prevent recurrence of fecal impaction.   --Asymptomatic.  Continue bowel regimen.  Rectovaginal fistula?.  Stool noted from the vagina?.  Consider GYN evaluation as outpatient.  Not clear whether this is actual. --Follow-up as an outpatient.  Appears to be asymptomatic at this time.  RESOLVED . Sepsis secondary to MSSA bacteremia  . DKA . Severe hypercalcemia superimposed on chronic hypercalcemia secondary to primary hyperparathyroidism. Followed by endocrinology as an outpatient. Treated with bisphosphonate and calcitonin. . Acute hypoxemic respiratory failure with uncompensated respiratory acidosis secondary to bilateral lower lobe pneumonia . Acute metabolic encephalopathy.  MRI brain no acute finding. . Oropharyngeal thrush   Nutritional Assessment: Body mass index is 31.21 kg/m.Marland Kitchen Seen by dietician.  I agree with the assessment and plan as outlined below: Nutrition Status: Nutrition Problem: Increased nutrient needs Etiology: acute illness (sepsis) Signs/Symptoms: estimated needs Interventions: Prostat,MVI,Glucerna shake  Disposition Plan:  Discussion: remains critically ill  Status is: Inpatient  Remains inpatient appropriate because:Hemodynamically unstable, Persistent severe electrolyte disturbances, Ongoing diagnostic testing needed not appropriate for outpatient work up, IV treatments appropriate due to intensity of illness or inability to take PO and Inpatient level of care appropriate due to severity of illness   Dispo: The patient is from: Home              Anticipated d/c is to: uncertain              Anticipated d/c date is: > 3 days              Patient  currently is not medically stable to d/c.   Difficult to place patient Yes  DVT prophylaxis: Place TED hose Start: 04/19/20 1604 SCDs Start: 03/18/2020 1608   Code  Status: Full Code Level of care: ICU Family Communication: None at bedside.  Vernell Leep, MD, [place], [employer] [facility]  To contact the attending provider between 7A-7P or the covering provider during after hours 7P-7A, please log into the web site [email or web address] and access using universal [place] password for that web site. If you do not have the password, please call the hospital operator.  04/22/2020, 3:15 PM  LOS: 32 days   Significant Hospital Events   . 1/8 admit for DKA, falls, AKI, hypercalcemia . 1/9 MSSA bacteremia, ID consult, ortho consult for elbow infection . 1/10 I&D . 1/13 ID signs off . 1/25 GI consult for anemia . 2/7 hypotension, shock transferred to stepdown . 2/9: CCM assumed care.  TRH signed off.   Consults:  . ID . Ortho . GI . PCCM . Nephrology . Hematology . PMD   Procedures:  . 1/10 I&D left septic olecranon bursitis including osteo of the olecranon . 1/28 colonoscopy.  Fecal impaction with associated stercoral ulcer.  Manual disimpaction.  Polyp. . 1/28 EGD.  Linear esophageal ulcer, nonbleeding gastric ulcer, erosive gastropathy, nonbleeding duodenal ulcers  Significant Diagnostic Tests:  . 1/11 TEE no endocarditis  Interval History/Subjective  CC: f/u infection  Poor historian.  Asking for water to drink/mild dry.  Denies pain.  As per RN at bedside, was off pressors for several hours and then became hypotensive again this morning and pressors were reinitiated.  Also hemoglobin and potassium low, being replaced.  Objective   Vitals:  Vitals:   04/22/20 1200 04/22/20 1400  BP: (!) 98/35 (!) 111/47  Pulse: (!) 106 (!) 115  Resp: 18 17  Temp: (!) 97.2 F (36.2 C)   SpO2: 100% 98%    Exam: Constitutional:   . Appears calm and comfortable, ill but not  toxic ENMT:  . grossly normal hearing  Respiratory:  . CTA bilaterally, no w/r/r.  . Respiratory effort normal.  On room air. Cardiovascular:  . RRR, no m/r/g . 2+ BLE extremity edema   . 2+ RUE edema.  Consistent with anasarca. Abdomen:  . Soft ntnd Musculoskeletal:  . Appears to have symmetric normal power. Neurologic:  . Alert and oriented x2.  No focal neurological deficits. Psychiatric:  . Mental status o Flat affect.   I have personally reviewed the following:   Today's Data   Creatinine slightly better at 3.02.  CO2 down to 9.  Calcium 10.9.  Ionized calcium 1.48.    Hemoglobin 7.1<8.1<6.3.  Potassium 3.4.  INR improved, now 2.9  Hepatitis panel was negative  Urinalysis was equivocal  Right upper extremity venous Doppler negative for DVT.  Positive for superficial cephalic thrombus, this was seen previously.  Scheduled Meds: . (feeding supplement) PROSource Plus  30 mL Oral TID BM  . sodium chloride   Intravenous Once  . chlorhexidine gluconate (MEDLINE KIT)  15 mL Mouth Rinse BID  . Chlorhexidine Gluconate Cloth  6 each Topical Daily  . docusate  100 mg Per Tube BID  . etomidate      . fentaNYL      . hydrocortisone sod succinate (SOLU-CORTEF) inj  50 mg Intravenous Q6H  . insulin aspart  0-9 Units Subcutaneous Q4H  . mouth rinse  15 mL Mouth Rinse 10 times per day  . midazolam      . nystatin  5 mL Oral QID  . nystatin   Topical TID  . pantoprazole (PROTONIX) IV  40 mg Intravenous Q12H  . phytonadione  10 mg Subcutaneous Daily  . polyethylene glycol  17 g Per Tube Daily  . rocuronium bromide      . sertraline  100 mg Oral Daily  . sodium bicarbonate  100 mEq Intravenous Once  . sodium chloride flush  10-40 mL Intracatheter Q12H   Continuous Infusions: . sodium chloride    . ceFEPime (MAXIPIME) IV Stopped (04/21/20 1631)  . dexmedetomidine (PRECEDEX) IV infusion    . metronidazole 100 mL/hr at 04/22/20 1425  . norepinephrine (LEVOPHED) Adult  infusion 9 mcg/min (04/22/20 1332)  .  sodium bicarbonate (isotonic) infusion in sterile water 100 mL/hr at 04/22/20 1425    Principal Problem:   MSSA bacteremia Active Problems:   Hypercalcemia   Depression, major, single episode, moderate (HCC)   DKA (diabetic ketoacidosis) (HCC)   Acute pain of right shoulder   Left elbow pain   Acute kidney injury ([place])   Weakness   Recurrent falls   Sepsis due to methicillin susceptible Staphylococcus aureus ([place])   Osteomyelitis of left elbow (HCC)   Osteomyelitis of left ulna (HCC)   Sepsis (HCC)   Blood in the stool   Anemia due to GI blood loss   Coagulopathy (HCC)   Duodenal ulcer   Acute gastric ulcer without hemorrhage or perforation   Adenomatous polyp of ascending colon   Adenomatous polyp of descending colon   Stercoral ulcer of rectum   Fecal impaction (HCC)   Lung mass   Thyroid nodule   Pancreatic mass   Aortic atherosclerosis ([place])   Discitis   Vertebral osteomyelitis ([place])   Lung abscess ([place])   Shock ([place])   LOS: 32 days   How to contact the [facility] Attending or Consulting provider 7A - 7P or covering provider during after hours 7P -7A, for this patient?  1. Check the care team in [facility] and look for a) attending/consulting TR[facility] provider listed and b) the [facility] team listed 2. Log into [email or web address] and use [place]'s universal password to access. If you do not have the password, please contact the hospital operator. 3. Locate the [facility] provider you are looking for under [facility] and page to a number that you can be directly reached. 4. If you still have difficulty reaching the provider, please page the [facility] (Director on Call) for the Hospitalists listed on amion for assistance.

## 2020-04-22 NOTE — Progress Notes (Signed)
[facility] Progress Note Patient Name: Deborah Figueroa DOB: [birth year] MRN: [id number]   Date of Service  04/22/2020  HPI/Events of Note  Hypokalemia - K+ = 2.6 and Creatinine = 2.85.  eICU Interventions  Will replace K+.      Intervention Category Major Interventions: Electrolyte abnormality - evaluation and management  Savanna Dooley Cornelia [facility] 04/22/2020, 9:34 PM

## 2020-04-22 NOTE — Progress Notes (Signed)
NAME:  Deborah Figueroa, MRN:  [id number], DOB:  [birth year], LOS: 58 ADMISSION DATE:  03/31/2020, CONSULTATION DATE: 2/7 REFERRING MD:  Dr. Sarajane Jews, CHIEF COMPLAINT:  Hypotension   Brief History:  65 y/o F, homeless, admitted 1/8 with reports of weakness, falls, increased urination and thirst.  She was found to have DKA on presentation and was started on insulin / IVF and admitted per [facility].  She had a leukocytosis on presentation and left elbow pain which led to identification of sepsis secondary to MSSA bacteremia.  Imaging identified left olecranon bursitis and osteomyelitis.  She underwent I&D on 1/10 with fluid / tissue from the L olecranon grew MSSA. Repeat cultures subsequently cleared.  Plan is to complete 6 weeks of IV antibiotics (likely inpatient due to homelessness / lack of insurance). She developed a normocytic normochromic anemia with a positive FOBT, epistaxis and GI was consulted as well as ONC.  She had an EGD + colonoscopy on 1/27 which showed stercoral ulcer and gastric ulcer.  ONC felt her anemia was in the setting of sepsis.  She was noted to have coagulopathy and treated with vitamin K.  She developed hypoxemic respiratory distress in the setting of PNA.  She has had extensive radiographic evaluation which has raised concern for RUL pulmonary mass, enlarging left pleural effusion, thyroid nodule, & possible pathologic fractures of spine.  She developed worsening AKI, AGMA & hypotension prompting PCCM consultation on 2/7.  Past Medical History:  Homelessness  Seasonal Allergies  Depression  Cellulitis  HTN HLD DM  Significant Hospital Events:  1/08 Admit  1/10 ID of left arm per Ortho  1/27 EGD + Colonoscopy  2/07 PCCM consulted  2/08 Hypotension resolved with bicarbonate administration, off pressors. FFP for INR >10 2/09 Hgb 6.3, PRBC. AKI. Electrolyte disturbances.   Consults:  ONC  GI  Ortho  ID NSGY PCCM Nephrology  Procedures:    Significant Diagnostic  Tests:   CT Head 1/6 >> negative for acute intracranial abnormality, forehead periorbital hematoma  Renal US 1/8 >> negative   CT L Humerus / Forearm 1/10 >> osteomyelitis of the olecranon with adjacent inflammatory phlegmon and abscess in the region of the olecranon bursa, evidence of pyomyositis in the flexor digitorum superficialis muscle of the proximal forearm, small abscess along the ventral aspect of the lower bisceps  MRI Brain 1/9 >> no evidence of recent infarction   MRI L Elbow 1/9 >> limited study, osteomyelitis of the olecranon and proximal ulna, moderate joint effusion concerning for septic arthritis, proximal forearm flexor compartment intramuscular fluid collection concerning for abscess, additional incompletely visualized small fluid collections in the distal upper arm   ECHO 1/11 >> LVEF 65-70%, LV with hyperdynamic function, mild LVH, RV systolic function normal,   TEE 1/11 >> no evidence of vegetation / infective endocarditis   CT Chest 1/17 >> right apical lung mass 2.7 x 2.3 x2 cm concerning for neoplasm potentially with chest wall invasion, bilateral lower lobe infiltrates c/w atelectasis and pneumonia.  Ill defined peripancreatic tissue planes raising question of pancreatitis at the tail, left thyroid nodule 2.2 x 1.9 cm.    [place] ABD Limited 1/23 >> cholelithiasis with contracted gallbladder around at least a single gallstone, normal sonographic appearance of the liver  CT Chest w/o 2/1 >> limited evaluation with non-contrasted study, persistent bibasilar airspace disease with increasing left effusion, possible right apical lung mass with mottled appearance of C7, T1 vertebral bodies with a possible pathologic fracture through the T1 transverse process  on the right. Abnormal appearance of T7, T8 vertebral bodies.  Very abnormal appearance of the partially visualized pancreas with questionable pancreatic tail mass, new fluid abutting the proximal stomach, non-specific left  flank edema, 2.7cm left sided thyroid nodule, non-specific bilateral glenohumeral joint effusions.  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  CT ABD / Pelvis 2/7 >> acute pancreatitis, multiple pancreatic pseudocysts, mass-like prominence of the tail of the pancreas, multiple nodular densities extending inferiorly from the tail of the pancreas although metastasis is not excluded, small bilateral pleural effusions L>R with bibasilar atelectasis, no bowel obstruction, normal appendix   Micro Data:  COVID 1/8 >> negative  HIV 1/8 >> negative  BCID 1/8 >> staph detected  MRSA PCR 1/10 >> positive  BCx2 1/8 >> MSSA L Olecranon 1/10 >> MSSA  UC 1/17 >> greater than 100k yeast  BCx2 1/16 >> negative  MRSA PCR 2/7 >> negative   Antimicrobials:  Cefazolin 1/9 >> 1/23  Nafcillin 1/9 >> 1/10  Cefepime 1/18 >>  Diflucan 2/4 >> 2/7 Flagyl 2/7 >>  Interim History / Subjective:  PRBC infusing RN report pt restarted on neosynephrine this am via PIV 120 mcg Multiple electrolyte disturbances  Glucose trend  72 to 98  I/O 750 ml UOP, +5.9L in last 24 hours  Objective   Blood pressure (!) 108/39, pulse 96, temperature (!) 96.9 F (36.1 C), temperature source Axillary, resp. rate 19, height 5\' 11"  (1.803 m), weight 101.5 kg, SpO2 99 %.        Intake/Output Summary (Last 24 hours) at 04/22/2020 0857 Last data filed at 04/22/2020 0737 Gross per 24 hour  Intake 4896.46 ml  Output 750 ml  Net 4146.46 ml   Filed Weights   03/26/2020 0952 04/03/2020 1400 04/20/20 1100  Weight: 93.9 kg 93.9 kg 101.5 kg    Examination: General: ill appearing adult female lying in bed in NAD HEENT: MM pink/moist, anicteric, resolving bruising to right eye, pupils equal / reactive  Neuro: Awakens to voice, smiles at provider upon entry to room, MAE / generalized  weakness, oriented to self, place, time, occasional periods of confusion but easily reoriented  CV: s1s2 RRR, SR on monitor with PVC's, ? Murmur heard best at 2nd ICS LSB PULM: non-labored on RA, lungs bilaterally clear, diminished bases GI: soft, bsx4 active  Extremities: warm/dry, anasarca  Skin: petechiae noted on upper extremities, L>R, small ulceration on right 2nd toe (hammer toe / area of pressure)  Resolved Hospital Problem list      Assessment & Plan:   Severe Sepsis in setting of left Olecranon Osteomyelitis with subsequent MSSA Bacteremia  Hypotension >> now on Neo ( 2/8) Rule Out Adrenal Insufficiency ? Murmur  Suspect constellation of findings related to down stream effects of sepsis / MSSA bacteremia in the setting of poorly controlled DM. She is clinically asymptomatic despite hypotension.  At baseline, takes metoprolol for HTN. ? Component of relative adrenal insufficiency (despite prior assessment).  -change vasopressor to levophed, discontinue phenylephrine  -continue stress dose steroids  -continue cefepime, flagyl  -follow cultures to maturity  -appreciate ID input  -50g 25% albumin now  -consider repeat TEE, will discuss with ID   Acute Hypoxemic Respiratory Failure  Small effusions per CT abd pelvis 2/7 PNA  Bibasilar infiltrates / consolidation with air bronchograms on imaging.  -pulmonary hygiene -  IS, mobilize  -follow CXR intermittently   AKI  AG / NG Metabolic Acidosis Hypercalcemia  Hypokalemia  BUN / sr Cr ratio 20.6 suggestive of pre-renal, dry mucus membranes on exam.  Sodium bicarbonate does not correct to normal, suggestive of component of non-gap acidosis.   -continue sodium bicarbonate infusion  -replace K, Mg+  -appreciate Nephrology assistance  -will plan for 10 mg IV lasix after albumin  -corrected calcium 11.5, s/p calcitonin  -Trend BMP / urinary output -Replace electrolytes as indicated -Avoid nephrotoxic agents, ensure adequate  renal perfusion  Normocytic Normochromic Anemia  Ulcer identified on EGD -trend CBC  -transfuse for Hgb <7%, 1 unit PRBC 2/9  -PPI BID   Coagulopathy INR 9.6 2/7, not on anticoagulation, Rx with 3 units FFP + vitamin K -follow INR -appreciate Hematology  -SCD's for DVT prophylaxis   Gastric Ulcer  Biopsy negative  -PPI  -diet as tolerated but not not taking in PO's / nausea  Poorly Controlled DM  Hgb A1c 10.4 on admit.  -SSI, change to sensitive scale with low glucose  -hold oral agents, doubt she was taking at home   Pancreatitis per CT 2/7, with possible Pseudocyts -trend lipase -follow abdominal exam  -appreciate IR assistance with drain placement / fluid sampling   Right Apical Lung Mass Never -smoker, but second hand smoke as a child DDx includes infectious / inflammatory etiology vs malignancy given other imaging findings -treat as infection, will need follow up chest / abd imaging  -may need biopsy if residual mass   Questionable C7, T1 Vertebral Body Pathologic Fracture ? Infectious vs malignant etiology  -follow up imaging   Thyroid Nodule  2.7 cm left thyroid nodule -will need biopsy once through acute illness   Hx HTN, HLD -hold home antihypertensives   Questionable Rectovaginal Fistula -will need further evaluation   Best practice (evaluated daily)  Diet: clear liquid diet  Pain/Anxiety/Delirium protocol (if indicated): n/a  VAP protocol (if indicated): n/a  DVT prophylaxis: SCD's GI prophylaxis: PPI  Glucose control: SSI  Mobility: as tolerated  Disposition: SDU   Goals of Care:  Last date of multidisciplinary goals of care discussion: pending Family and staff present:  Summary of discussion: pending  Follow up goals of care discussion due: 2/14  Code Status: Full Code. Patient reports her son would make decisions for her if she could not. She has 3 adult children.    Family: Son, [name], updated via phone 2/9 in detail on plan of care.  He shares that she has had longstanding issues with dizziness & falls.  She has struggled for years with homelessness and financial issues.  He says despite these issues, she always remains optimistic and positive. He relays that her hotel room was very disheveled and there were un-cashed checks in the room etc. Reviewed patients multiple medical problems and he has a good understanding of issues at hand. He states he trusts the healthcare team to make the best decisions for his mother.  We discussed the potential need for more advanced support such as mechanical ventilation. I asked him to think about the concept of CPR.  Will follow up with family, support offered to son.   Labs   CBC: Recent Labs  Lab 04/20/20 0501 04/20/20 1528 04/21/20 0241 04/22/20 0504  WBC 13.4*  --  17.5* 8.9  NEUTROABS  --   --   --  8.2*  HGB 8.4*  --  7.6* 6.3*  HCT 29.3*  --  27.1* 20.7*  MCV 96.4  --  97.8 92.0  PLT 227 212 295 063    Basic Metabolic Panel: Recent Labs  Lab 04/17/20 0511 04/18/20 0605 04/19/20 0622 04/20/20 0501 04/20/20 1313 04/21/20 0241 04/22/20 0504  NA 139 140 140 140 140 141 146*  K 2.8* 3.6 3.6 3.4* 3.4* 4.6 2.8*  CL 108 111 110 107 108 110 111  CO2 16* 15* 16* 11* 13* 10* 15*  GLUCOSE 119* 142* 105* 151* 117* 113* 84  BUN 49* 52* 58* 60* 60* 62* 59*  CREATININE 2.44* 2.06* 2.65* 2.83* 2.91* 2.86* 2.73*  CALCIUM 10.1 10.6* 11.1* 11.3* 11.3* 11.3* 11.5*  MG 2.0 2.0  --   --   --  1.9 1.7  PHOS  --   --   --   --   --   --  5.7*   GFR: Estimated Creatinine Clearance: 27.3 mL/min (A) (by C-G formula based on SCr of 2.73 mg/dL (H)). Recent Labs  Lab 04/16/20 0506 04/20/20 0501 04/20/20 1020 04/20/20 1313 04/21/20 0241 04/22/20 0504 04/22/20 0505  PROCALCITON 0.29  --   --   --   --   --   --   WBC  --  13.4*  --   --  17.5* 8.9  --   LATICACIDVEN  --   --  2.0* 1.7 1.4  --  1.7    Liver Function Tests: Recent Labs  Lab 04/18/20 0605 04/19/20 0622  04/20/20 0501 04/21/20 0241 04/22/20 0504  AST 10* 10* 9* 13* 10*  ALT <5 <5 <5 <5 <5  ALKPHOS 73 77 77 68 58  BILITOT 0.6 0.4 0.7 1.0 0.7  PROT 6.2* 6.3* 6.2* 5.7* 5.5*  ALBUMIN 1.5* 1.6* 1.6* 1.9* 1.6*   Recent Labs  Lab 04/18/20 0605 04/20/20 1313 04/21/20 0241 04/22/20 0504  LIPASE 40 40 44 60*  AMYLASE  --   --   --  19*   No results for input(s): AMMONIA in the last 168 hours.  ABG    Component Value Date/Time   HCO3 14.6 (L) 04/07/2020 1413   TCO2 29 08/16/2007 2046   ACIDBASEDEF 11.6 (H) 03/20/2020 1413   O2SAT 86.3 03/17/2020 1413     Coagulation Profile: Recent Labs  Lab 04/20/20 0930 04/20/20 1528 04/21/20 0241 04/21/20 1135 04/22/20 0504  INR 9.6* >10.0* 6.0* 2.9* 1.7*    Cardiac Enzymes: No results for input(s): CKTOTAL, CKMB, CKMBINDEX, TROPONINI in the last 168 hours.  HbA1C: Hgb A1c MFr Bld  Date/Time Value Ref Range Status  03/19/2020 11:11 AM 10.4 (H) 4.6 - 6.5 % Final    Comment:    Glycemic Control Guidelines for People with Diabetes:Non Diabetic:  <6%Goal of Therapy: <7%Additional Action Suggested:  >8%   09/13/2019 03:57 PM 6.6 (H) <5.7 % of total Hgb Final    Comment:    For someone without known diabetes, a hemoglobin A1c value of 6.5% or greater indicates that they may have  diabetes and this should be confirmed with a follow-up  test. . For someone with known diabetes, a value <7% indicates  that their diabetes is well controlled and a value  greater than or equal to 7% indicates suboptimal  control. A1c targets should be individualized based on  duration of diabetes, age, comorbid conditions, and  other considerations. . Currently, no consensus exists regarding use of hemoglobin A1c for diagnosis of diabetes for children. .     CBG: Recent Labs  Lab 04/21/20 2013 04/21/20 2308 04/22/20 0356 04/22/20  0543 04/22/20 0742  GLUCAP 132* 134* 72 93 98       Critical care time: 40 minutes     Noe Gens, MSN,  APRN, NP-C, AGACNP-BC [facility] 04/22/2020, 8:57 AM   Please see [email or web address] for pager details.   From 7A-7P if no response, please call [phone] After hours, please call ELink [phone]

## 2020-04-23 ENCOUNTER — Inpatient Hospital Stay (HOSPITAL_COMMUNITY): Payer: Medicaid Other

## 2020-04-23 DIAGNOSIS — Z7189 Other specified counseling: Secondary | ICD-10-CM

## 2020-04-23 LAB — TYPE AND SCREEN
ABO/RH(D): A POS
Antibody Screen: NEGATIVE
Unit division: 0
Unit division: 0

## 2020-04-23 LAB — GLUCOSE, CAPILLARY
Glucose-Capillary: 106 mg/dL — ABNORMAL HIGH (ref 70–99)
Glucose-Capillary: 108 mg/dL — ABNORMAL HIGH (ref 70–99)
Glucose-Capillary: 108 mg/dL — ABNORMAL HIGH (ref 70–99)
Glucose-Capillary: 134 mg/dL — ABNORMAL HIGH (ref 70–99)
Glucose-Capillary: 135 mg/dL — ABNORMAL HIGH (ref 70–99)
Glucose-Capillary: 139 mg/dL — ABNORMAL HIGH (ref 70–99)

## 2020-04-23 LAB — COMPREHENSIVE METABOLIC PANEL
ALT: <5 U/L (ref 0–44)
AST: 10 U/L — ABNORMAL LOW (ref 15–41)
Albumin: 1.8 g/dL — ABNORMAL LOW (ref 3.5–5.0)
Alkaline Phosphatase: 54 U/L (ref 38–126)
Anion gap: 21 — ABNORMAL HIGH (ref 5–15)
BUN: 57 mg/dL — ABNORMAL HIGH (ref 8–23)
CO2: 17 mmol/L — ABNORMAL LOW (ref 22–32)
Calcium: 9.8 mg/dL (ref 8.9–10.3)
Chloride: 105 mmol/L (ref 98–111)
Creatinine, Ser: 2.66 mg/dL — ABNORMAL HIGH (ref 0.44–1.00)
GFR, Estimated: 19 mL/min — ABNORMAL LOW (ref >60)
Glucose, Bld: 131 mg/dL — ABNORMAL HIGH (ref 70–99)
Potassium: 3.8 mmol/L (ref 3.5–5.1)
Sodium: 143 mmol/L (ref 135–145)
Total Bilirubin: 0.8 mg/dL (ref 0.3–1.2)
Total Protein: 4.8 g/dL — ABNORMAL LOW (ref 6.5–8.1)

## 2020-04-23 LAB — BPAM RBC
Blood Product Expiration Date: 202203052359
Blood Product Expiration Date: 202203052359
ISSUE DATE / TIME: 202202090808
ISSUE DATE / TIME: 202202092147
Unit Type and Rh: 6200
Unit Type and Rh: 6200

## 2020-04-23 LAB — CBC
HCT: 24.9 % — ABNORMAL LOW (ref 36.0–46.0)
Hemoglobin: 8.1 g/dL — ABNORMAL LOW (ref 12.0–15.0)
MCH: 28.4 pg (ref 26.0–34.0)
MCHC: 32.5 g/dL (ref 30.0–36.0)
MCV: 87.4 fL (ref 80.0–100.0)
Platelets: 169 10*3/uL (ref 150–400)
RBC: 2.85 MIL/uL — ABNORMAL LOW (ref 3.87–5.11)
RDW: 18.3 % — ABNORMAL HIGH (ref 11.5–15.5)
WBC: 7.8 10*3/uL (ref 4.0–10.5)
nRBC: 0.4 % — ABNORMAL HIGH (ref 0.0–0.2)

## 2020-04-23 LAB — BLOOD GAS, ARTERIAL
Acid-base deficit: 16.5 mmol/L — ABNORMAL HIGH (ref 0.0–2.0)
Bicarbonate: 8.8 mmol/L — ABNORMAL LOW (ref 20.0–28.0)
FIO2: 40
O2 Saturation: 91.4 %
Patient temperature: 99.5
pCO2 arterial: 19.6 mmHg — CL (ref 32.0–48.0)
pH, Arterial: 7.279 — ABNORMAL LOW (ref 7.350–7.450)
pO2, Arterial: 70 mmHg — ABNORMAL LOW (ref 83.0–108.0)

## 2020-04-23 LAB — MAGNESIUM: Magnesium: 1.7 mg/dL (ref 1.7–2.4)

## 2020-04-23 MED ORDER — STERILE WATER FOR INJECTION IV SOLN
INTRAVENOUS | Status: DC
  Filled 2020-04-23: qty 150
  Filled 2020-04-23 (×2): qty 850
  Filled 2020-04-23: qty 150

## 2020-04-23 MED ORDER — SODIUM CHLORIDE 0.9 % IV SOLN
2.0000 g | Freq: Two times a day (BID) | INTRAVENOUS | Status: DC
  Administered 2020-04-23 (×2): 2 g via INTRAVENOUS
  Filled 2020-04-23 (×2): qty 2

## 2020-04-23 MED ORDER — ORAL CARE MOUTH RINSE
15.0000 mL | OROMUCOSAL | Status: DC
  Administered 2020-04-23 – 2020-04-24 (×13): 15 mL via OROMUCOSAL

## 2020-04-23 MED ORDER — NOREPINEPHRINE 4 MG/250ML-% IV SOLN
0.0000 ug/min | INTRAVENOUS | Status: DC
  Administered 2020-04-23: 3 ug/min via INTRAVENOUS
  Filled 2020-04-23 (×2): qty 250

## 2020-04-23 MED ORDER — VITAMIN K1 10 MG/ML IJ SOLN
10.0000 mg | Freq: Every day | INTRAVENOUS | Status: AC
  Administered 2020-04-23 – 2020-04-24 (×2): 10 mg via INTRAVENOUS
  Filled 2020-04-23 (×3): qty 1

## 2020-04-23 MED ORDER — ONDANSETRON HCL 4 MG/2ML IJ SOLN: 4.0000 mg | Freq: Four times a day (QID) | INTRAMUSCULAR | Status: DC | PRN

## 2020-04-23 MED ORDER — FENTANYL 2500MCG IN NS 250ML (10MCG/ML) PREMIX INFUSION
100.0000 ug/h | INTRAVENOUS | Status: DC
  Administered 2020-04-23: 50 ug/h via INTRAVENOUS
  Administered 2020-04-24: 100 ug/h via INTRAVENOUS
  Filled 2020-04-23 (×2): qty 250

## 2020-04-23 MED ORDER — SERTRALINE HCL 100 MG PO TABS
100.0000 mg | ORAL_TABLET | Freq: Every day | ORAL | Status: DC
  Administered 2020-04-23 – 2020-04-24 (×2): 100 mg
  Filled 2020-04-23 (×2): qty 1

## 2020-04-23 MED ORDER — FENTANYL BOLUS VIA INFUSION
50.0000 ug | INTRAVENOUS | Status: DC | PRN
  Administered 2020-04-23 – 2020-04-24 (×10): 50 ug via INTRAVENOUS
  Filled 2020-04-23: qty 50

## 2020-04-23 MED ORDER — SODIUM BICARBONATE 8.4 % IV SOLN
INTRAVENOUS | Status: AC
  Administered 2020-04-23: 100 meq via INTRAVENOUS
  Filled 2020-04-23: qty 100

## 2020-04-23 MED ORDER — SODIUM BICARBONATE 8.4 % IV SOLN: 100.0000 meq | Freq: Once | INTRAVENOUS | Status: AC

## 2020-04-23 MED ORDER — ACETAMINOPHEN 160 MG/5ML PO SOLN: 650.0000 mg | Freq: Four times a day (QID) | ORAL | Status: DC | PRN

## 2020-04-23 MED ORDER — ONDANSETRON HCL 4 MG PO TABS: 4.0000 mg | ORAL_TABLET | Freq: Four times a day (QID) | ORAL | Status: DC | PRN

## 2020-04-23 MED ORDER — CHLORHEXIDINE GLUCONATE 0.12% ORAL RINSE (MEDLINE KIT)
15.0000 mL | Freq: Two times a day (BID) | OROMUCOSAL | Status: DC
  Administered 2020-04-23 – 2020-04-24 (×2): 15 mL via OROMUCOSAL

## 2020-04-23 NOTE — Progress Notes (Addendum)
Progress Note  Chief Complaint:    Pancreatitis, pancreatic mass     ASSESSMENT / PLAN:    # 65 yo female with who we saw in hospital 1/26 for progressive anemia and FOBT+ while hospitalized with DKA and sepsis secondary to MSSA from left elbow osteomyelitis. She underwent EGD and colonoscopy with findings of gastric and duodenal ulcers. Bowel prep was inadequate but remarkable for stercoral ulcer and small adenomatous colon polyp.  Treated with PPI, Miralax and signed off case.  Biopsies negative for H.pylori  # MSSA L olecranon bursiitis, osteomyelitis. ID following. On antibiotics.   # GI reconsulted 04/21/20 for acute pancreatitis, multiple pseudocysts,  and possible  pancreatic mass on CT scan. Dr. Ardis Hughs reviewed scan on 2/9, concerned for infected pancreatic fluid collections.. IR evaluated yesterday and plan was to aspirate fluid collection yesterday pm but not yet done. Patient required intubation last evening. She remains critically ill with sepsis, respiratory failure, renal failure. Temp 101 this am. Regarding evaluation of mass like lesion in pancreas, this will need repeat imaging, +/- biopsy if solid mass found. Preferably repeat imaging would be down several weeks after resolution of acute illness.    # Anemia, received multiple units of blood this admission. Required another unit 2/9 and yet another early this am for hgb of 6.4. No reports of overt GI bleeding. EGD and colonoscopy earlier this month with findings as above.   --Continue BID IV PPI --Continue to monitor H+H. No plans for repeat endoscopic evaluation right now.   # Coagulopathy, unclear etiology, Resolving.   SUBJECTIVE:   intubated    OBJECTIVE:   CTAP without contrast 04/20/2020 1. Acute pancreatitis. Correlation with pancreatic enzymes recommended. Multiple pancreatic pseudocysts as above. 2. Masslike prominence of the tail of the pancreas. Further evaluation of the pancreas with MRI without and  with contrast or pancreatic protocol CT on a nonemergent/outpatient basis recommended. 3. Multiple nodular densities extending inferiorly from the tail of the pancreas may be sequela of pancreatitis although metastasis is not excluded in this patient with lung mass. 4. Small bilateral pleural effusions, left greater than right with bibasilar atelectasis or infiltrate. Clinical correlation and follow-up recommended. 5. No bowel obstruction. Normal appendix. 6. Aortic Atherosclerosis    Scheduled inpatient medications:  . (feeding supplement) PROSource Plus  30 mL Oral TID BM  . sodium chloride   Intravenous Once  . chlorhexidine gluconate (MEDLINE KIT)  15 mL Mouth Rinse BID  . Chlorhexidine Gluconate Cloth  6 each Topical Daily  . docusate  100 mg Per Tube BID  . hydrocortisone sod succinate (SOLU-CORTEF) inj  50 mg Intravenous Q6H  . insulin aspart  0-9 Units Subcutaneous Q4H  . mouth rinse  15 mL Mouth Rinse 10 times per day  . nystatin  5 mL Oral QID  . nystatin   Topical TID  . pantoprazole (PROTONIX) IV  40 mg Intravenous Q12H  . phytonadione  10 mg Subcutaneous Daily  . polyethylene glycol  17 g Per Tube Daily  . sertraline  100 mg Per Tube Daily  . sodium bicarbonate  1,300 mg Per Tube TID  . sodium chloride flush  10-40 mL Intracatheter Q12H   Continuous inpatient infusions:  . sodium chloride    . ceFEPime (MAXIPIME) IV    . dexmedetomidine (PRECEDEX) IV infusion 0.4 mcg/kg/hr (04/23/20 0536)  . metronidazole Stopped (04/23/20 5916)  . norepinephrine (LEVOPHED) Adult infusion 1 mcg/min (04/22/20 2220)  .  sodium bicarbonate (isotonic) infusion in  sterile water 40 mL/hr at 04/22/20 2152   PRN inpatient medications: acetaminophen (TYLENOL) oral liquid 160 mg/5 mL, dextrose, fentaNYL (SUBLIMAZE) injection, fentaNYL (SUBLIMAZE) injection, liver oil-zinc oxide, ondansetron **OR** ondansetron (ZOFRAN) IV, oxymetazoline, phenol, prochlorperazine, sodium chloride  flush  Vital signs in last 24 hours: Temp:  [94.6 F (34.8 C)-101 F (38.3 C)] 101 F (38.3 C) (02/10 0804) Pulse Rate:  [61-124] 93 (02/10 0700) Resp:  [17-27] 27 (02/10 0700) BP: (70-144)/(35-75) 114/50 (02/10 0700) SpO2:  [95 %-100 %] 96 % (02/10 0857) FiO2 (%):  [30 %-100 %] 30 % (02/10 0857) Last BM Date: 04/19/20  Intake/Output Summary (Last 24 hours) at 04/23/2020 0958 Last data filed at 04/23/2020 0150 Gross per 24 hour  Intake 3071.1 ml  Output 350 ml  Net 2721.1 ml     Physical Exam:  . General: obese female, intubated.  Marland Kitchen Heart:  Regular rate and rhythm. No lower extremity edema . Pulmonary: intubated. No wheezes in chest.  . Abdomen: Soft, nondistended, grimaces with palpation of LUQ. A few bowel sounds.      Filed Weights   04/06/2020 0952 03/19/2020 1400 04/20/20 1100  Weight: 93.9 kg 93.9 kg 101.5 kg    Intake/Output from previous day: 02/09 0701 - 02/10 0700 In: 3824.6 [I.V.:2171.2; Blood:854.8; IV Piggyback:798.5] Out: 350 [Urine:350] Intake/Output this shift: No intake/output data recorded.    Lab Results: Recent Labs    04/22/20 0504 04/22/20 1200 04/22/20 1450 04/22/20 1924 04/23/20 0249  WBC 8.9 19.4*  --   --  7.8  HGB 6.3* 8.1* 7.1* 6.4* 8.1*  HCT 20.7* 26.0* 21.0* 20.1* 24.9*  PLT 178 310  --   --  169   BMET Recent Labs    04/22/20 1200 04/22/20 1450 04/22/20 1924 04/23/20 0249  NA 144 143 143 143  K 3.4* 3.0* 2.6* 3.8  CL 109  --  105 105  CO2 9*  --  14* 17*  GLUCOSE 132*  --  164* 131*  BUN 52*  --  57* 57*  CREATININE 3.02*  --  2.85* 2.66*  CALCIUM 10.9*  --  10.6* 9.8   LFT Recent Labs    04/23/20 0249  PROT 4.8*  ALBUMIN 1.8*  AST 10*  ALT <5  ALKPHOS 54  BILITOT 0.8   PT/INR Recent Labs    04/21/20 1135 04/22/20 0504  LABPROT 29.7* 19.2*  INR 2.9* 1.7*   Hepatitis Panel Recent Labs    04/21/20 0618  HEPBSAG NON REACTIVE  HCVAB NON REACTIVE  HEPAIGM NON REACTIVE  HEPBIGM NON REACTIVE    DG  Abd 1 View  Result Date: 04/22/2020 CLINICAL DATA:  Enteric catheter placement EXAM: ABDOMEN - 1 VIEW COMPARISON:  04/20/2020 FINDINGS: Supine frontal view of the abdomen and pelvis is obtained, excluding the lower pelvis by collimation. Tip and side port of an enteric catheter project over the gastric antrum. There is a paucity of bowel gas. No masses or abnormal calcifications. IMPRESSION: 1. Enteric catheter tip and side port projecting over the gastric antrum. Electronically Signed   By: Randa Ngo M.D.   On: 04/22/2020 15:52   Portable Chest x-ray  Result Date: 04/22/2020 CLINICAL DATA:  Intubated EXAM: PORTABLE CHEST 1 VIEW COMPARISON:  04/20/2020 FINDINGS: Single frontal view of the chest demonstrates endotracheal tube overlying tracheal air column, tip 3.5 cm above carina. Left subclavian central venous catheter tip overlies superior vena cava. Cardiac silhouette is unremarkable. Right apical mass again identified concerning for neoplasm. Bibasilar consolidation and small left  pleural effusion again noted, unchanged. No pneumothorax. No acute bony abnormality. IMPRESSION: 1. Support devices as above. 2. Persistent bibasilar consolidation and left effusion. 3. Stable right apical mass suspicious for neoplasm. Electronically Signed   By: Randa Ngo M.D.   On: 04/22/2020 15:52   [place] EKG SITE RITE  Result Date: 04/21/2020 If Site Rite image not attached, placement could not be confirmed due to current cardiac rhythm.    LOS: 52 days   Tye Savoy ,NP 04/23/2020, 9:58 AM  ________________________________________________________________________  Velora Heckler GI MD note:  I personally examined the patient, reviewed the data and agree with the assessment and plan described above.  Very complicated situation and in past 24 hours she has required intubation. She is still on single vasopressor, relatively low dose now.  Hb quite low again without any overt bleeding (no rectal bleeding, no blood in  OG tube). I suspect the persistent anemia is due to hemodilution (she is very edematous) and sepsis effect on bone marrow. Should transfuse blood products PRN.  There is proven or presumed infection in left elbow, osteo, right lung, spine.  I think there is likely infection in her abdomen as well, causing multiple sites of fluid throughout the abdomen.  Hopefully IR will be able to sample at least one of the suspicious fluid collections. Continue Abx per ID for now.  Should consider repeat abd imaging with CT in next few days pending her clinical course.  Will follow along. Her prognosis is quite guarded.  Owens Loffler, MD [facility] Pager [phone]

## 2020-04-23 NOTE — Progress Notes (Addendum)
Palliative care progress note  Reason for visit: Goals of care  I met this evening with patient's family in conjunction with Dr. Vaughan Browner.  Her son, [name], daughters Gilmore Laroche and Wells Guiles), daughter-in-law Jinny Blossom), and son-in-law Shanon Brow) were present for meeting.  We discussed her clinical course this admission including her prolonged stay with multiple medical problems and eventual development of multisystem organ failure secondary to sepsis.  We discussed concern that this is likely irreversible illness regardless of interventions moving forward and talked about options for care moving forward.  We discussed options for continuation of aggressive intervention, continuing current interventions while also placing limitations on care (no resuscitation in the event of cardiac arrest), or transitioning to a focus on comfort and liberating her from mechanical ventilation.  Following conversation, we are planning to continue with current therapies at this time while also placing a limit of care of no CPR or escalation of care in the event of cardiac arrest.  We will continue with mechanical ventilation while awaiting results of aspiration done today in IR.  I am planning to discuss further with critical care physician tomorrow and based upon her clinical course tonight we will continue conversation with family regarding next steps moving forward.  -No resuscitation in the event of cardiac arrest (DNR).  Continue with current care including continuation on mechanical ventilation for now. -Plan over the next couple of days will likely be to work to medically maximize her and then focus on extubating with plan for no reintubation.  If she does well post extubation, plan will be to continue with this course.  If, however, she decompensates following extubation, recommendation would be for refocusing care on comfort with plan not to reintubate. -Having family be able to come and see her before she passes is  going to be important to family.  Her brother lives out of town and very much wants to see her before she passes.  Will reassess situation tomorrow and discuss in conjunction with critical care service to best determine next steps to support family and work to allow visitation if it appears likely that she is going to continue to decompensate as plan if this is the case will likely involve transition to comfort at some point over the next couple of days.  Start time: 1720 End time: 1800 Greater than 50%  of this time was spent counseling and coordinating care related to the above assessment and plan.  Micheline Rough, MD [place] Team [phone]

## 2020-04-23 NOTE — Consult Note (Signed)
Consultation Note Date: 04/23/2020   Patient Name: Deborah Figueroa  DOB: 1955-10-09  MRN: [id number]  Age / Sex: 65 y.o., female  PCP: Carollee Herter, Alferd Apa, DO Referring Physician: [name], MD  Reason for Consultation: Establishing goals of care  HPI/Patient Profile: 65 y.o. female  with complicated hospitalization after being admitted on 1/8 with DKA and found to have sepsis in the setting of MSSA bacteremia secondary to left olecranon bursitis and osteomyelitis.  She developed respiratory failure secondary pneumonia as well as blood loss anemia.  EGD and colonoscopy showed stercoral ulcer and gastric ulcer.  There was concern for lung malignancy on imaging as well as an abnormal pancreatic mass and thyroid nodule.  It appears after MRI this is more suggestive of fluid infection rather than mass.  She had been continued on antibiotics for infection and on 2 7 developed hypotension with worsening AKI metabolic acidosis concerning for worsening sepsis and was transferred to the stepdown unit.  She would need to be started on vasopressors for persistent hypotension and concern on imaging was for acute pancreatitis versus intra-abdominal fluid collections.  Plan was for IR drainage, however, her status continued to decline and she was intubated this afternoon.  Palliative care consulted for goals of care.  Clinical Assessment and Goals of Care: Palliative care consult received.  Chart reviewed including personal review of pertinent labs and imaging.  Extensive chart review due to long and complicated course of admission.  Discussed with bedside care team.  Deborah Figueroa was intubated this afternoon due to concern about her tiring out and being headed toward respiratory failure.  There was tentative plan to go down to IR for placement of drain in abdomen for fluid collection, however this appears to be on hold at  this time.  I attempted to reach out to her family to discuss further.  Earlier this admission, she had noted that she would want her son to make decisions on her behalf she cannot make her own decisions.  She does have 3 children and they would share decision-making if she has not outlined this with prior day advance directive, however, I will reach out to her son first to initiate conversation.  When I called, his voicemail was full and there was no option to leave a voicemail.  Will attempt again tomorrow.  SUMMARY OF RECOMMENDATIONS   -Full code/full scope -Deborah Figueroa was intubated this afternoon.  Attempted to call family in order to discuss.  I was unable to leave a voicemail for her son as voicemail was full. -We will plan to reach out to family again tomorrow.  Code Status/Advance Care Planning:  Full code  Additional Recommendations (Limitations, Scope, Preferences):  Full Scope Treatment  Psycho-social/Spiritual:   Desire for further Chaplaincy support:Not assessed today  Prognosis:   Guarded  Discharge Planning: To Be Determined      Primary Diagnoses: Present on Admission: . DKA (diabetic ketoacidosis) ([place]) . Hypercalcemia . Depression, major, single episode, moderate ([place]) . Acute pain of right shoulder .  Osteomyelitis of left elbow ([place]) . Osteomyelitis of left ulna ([facility]) . Sepsis ([place])   I have reviewed the medical record, interviewed the patient and family, and examined the patient. The following aspects are pertinent.  Past Medical History:  Diagnosis Date  . Cellulitis   . Depression   . Diabetes mellitus without complication ([place])   . Hyperlipidemia   . Hypertension   . Lung mass 04/15/2020  . Pancreatic mass 04/15/2020  . Renal insufficiency   . Seasonal allergies   . Thyroid nodule 04/15/2020   Social History   Socioeconomic History  . Marital status: Divorced    Spouse name: Not on file  . Number of children: Not on file  . Years of  education: Not on file  . Highest education level: Not on file  Occupational History  . Not on file  Tobacco Use  . Smoking status: Never Smoker  . Smokeless tobacco: Never Used  Vaping Use  . Vaping Use: Never used  Substance and Sexual Activity  . Alcohol use: No  . Drug use: No  . Sexual activity: Not on file  Other Topics Concern  . Not on file  Social History Narrative  . Not on file   Social Determinants of Health   Financial Resource Strain: Not on file  Food Insecurity: Not on file  Transportation Needs: Not on file  Physical Activity: Not on file  Stress: Not on file  Social Connections: Not on file   Family History  Problem Relation Age of Onset  . Stroke Mother   . Hypertension Mother   . Hyperlipidemia Mother   . Cancer Father 50       liver and colon  . Depression Father   . Cancer Maternal Aunt        breast  . Diabetes Brother   . Cancer Maternal Grandmother        breast   Scheduled Meds: . (feeding supplement) PROSource Plus  30 mL Oral TID BM  . sodium chloride   Intravenous Once  . chlorhexidine gluconate (MEDLINE KIT)  15 mL Mouth Rinse BID  . Chlorhexidine Gluconate Cloth  6 each Topical Daily  . docusate  100 mg Per Tube BID  . hydrocortisone sod succinate (SOLU-CORTEF) inj  50 mg Intravenous Q6H  . insulin aspart  0-9 Units Subcutaneous Q4H  . mouth rinse  15 mL Mouth Rinse 10 times per day  . nystatin  5 mL Oral QID  . nystatin   Topical TID  . pantoprazole (PROTONIX) IV  40 mg Intravenous Q12H  . phytonadione  10 mg Subcutaneous Daily  . polyethylene glycol  17 g Per Tube Daily  . sertraline  100 mg Per Tube Daily  . sodium bicarbonate  1,300 mg Per Tube TID  . sodium chloride flush  10-40 mL Intracatheter Q12H   Continuous Infusions: . sodium chloride    . ceFEPime (MAXIPIME) IV    . dexmedetomidine (PRECEDEX) IV infusion 0.4 mcg/kg/hr (04/23/20 0536)  . metronidazole Stopped (04/23/20 8453)  . norepinephrine (LEVOPHED) Adult  infusion 1 mcg/min (04/22/20 2220)  .  sodium bicarbonate (isotonic) infusion in sterile water 40 mL/hr at 04/22/20 2152   PRN Meds:.acetaminophen (TYLENOL) oral liquid 160 mg/5 mL, dextrose, fentaNYL (SUBLIMAZE) injection, fentaNYL (SUBLIMAZE) injection, liver oil-zinc oxide, ondansetron **OR** ondansetron (ZOFRAN) IV, oxymetazoline, phenol, prochlorperazine, sodium chloride flush Medications Prior to Admission:  Prior to Admission medications   Medication Sig Start Date End Date Taking? Authorizing Provider  atorvastatin (LIPITOR)  40 MG tablet Take 1 tablet (40 mg total) by mouth daily. 03/19/20  Yes Roma Schanz R, DO  ceFAZolin (ANCEF) IVPB Inject 2 g into the vein every 8 (eight) hours. Indication:  MSSA bacteremia w/ associated olecranon bursitis   First Dose: Yes Last Day of Therapy:  05/05/2020 Labs - Once weekly:  CBC/D and BMP, Labs - Every other week:  ESR and CRP Method of administration: IV Push Method of administration may be changed at the discretion of home infusion pharmacist based upon assessment of the patient and/or caregiver's ability to self-administer the medication ordered. 04/06/20 05/16/20 Yes Mercy Riding, MD  fenofibrate 160 MG tablet Take 1 tablet (160 mg total) by mouth daily. 03/19/20  Yes Roma Schanz R, DO  glipiZIDE (GLUCOTROL) 5 MG tablet Take 0.5 tablets (2.5 mg total) by mouth daily before breakfast. 11/25/19  Yes Shamleffer, Melanie Crazier, MD  Insulin Pen Needle 31G X 8 MM MISC 1 daily 03/19/20  Yes Carollee Herter, Kendrick Fries R, DO  Lancets [facility] ULTRASOFT) lancets Use as instructed 10/02/17  Yes Roma Schanz R, DO  lisinopril-hydrochlorothiazide (ZESTORETIC) 20-12.5 MG tablet Take 2 tablets by mouth daily. 03/19/20  Yes Ann Held, DO  methocarbamol (ROBAXIN-750) 750 MG tablet Take 1 tablet (750 mg total) by mouth 4 (four) times daily. 03/19/20  Yes Roma Schanz R, DO  metoprolol succinate (TOPROL-XL) 100 MG 24 hr tablet Take with or  immediately following a meal. 03/19/20  Yes Roma Schanz R, DO  sertraline (ZOLOFT) 100 MG tablet Take 1 tablet (100 mg total) by mouth daily. 03/19/20  Yes Ann Held, DO  cyclobenzaprine (FLEXERIL) 5 MG tablet Take 0.5-1 tablets (2.5-5 mg total) by mouth 3 (three) times daily as needed for muscle spasms. DO NOT DRINK ALCOHOL OR DRIVE WHILE TAKING THIS MEDICATION 02/29/20   Volney American, PA-C  glucose blood [facility] VERIO) test strip USE AS DIRECTED TO CHECK BLOOD SUGAR ONCE DAILY Patient not taking: Reported on 03/18/2020 07/31/19   Carollee Herter, Alferd Apa, DO  insulin glargine (LANTUS SOLOSTAR) 100 UNIT/ML Solostar Pen Inject 10 Units into the skin daily. Patient not taking: Reported on 03/30/2020 03/19/20   Roma Schanz R, DO   No Known Allergies Review of Systems  Unable to obtain.  Intubated and sedated.  Physical Exam  General: Intubated and sedated  HEENT:  no JVD, ET tube in place Heart: Tachycardic. Lungs: Good air movement with vent support, clear Abdomen: Soft, nondistended, positive bowel sounds.  Ext: No significant edema Skin: Warm and dry Neuro: Unable to assess  Vital Signs: BP (!) 114/50   Pulse 93   Temp (!) 101 F (38.3 C) (Axillary)   Resp (!) 27   Ht 5' 11" (1.803 m)   Wt 101.5 kg   SpO2 96%   BMI 31.21 kg/m  Pain Scale: CPOT   Pain Score: 0-No pain   SpO2: SpO2: 96 % O2 Device:SpO2: 96 % O2 Flow Rate: .O2 Flow Rate (L/min): 2 L/min  IO: Intake/output summary:   Intake/Output Summary (Last 24 hours) at 04/23/2020 0902 Last data filed at 04/23/2020 0150 Gross per 24 hour  Intake 3071.1 ml  Output 350 ml  Net 2721.1 ml    LBM: Last BM Date: 04/19/20 Baseline Weight: Weight: 93.9 kg (taken from previous charting) Most recent weight: Weight: 101.5 kg     Palliative Assessment/Data:   Flowsheet Rows   Flowsheet Row Most Recent Value  Intake Tab  Referral Department Hospitalist  Unit at Time of Referral ICU  Date  Notified 04/08/2020  Palliative Care Type New Palliative care  Reason for referral Clarify Goals of Care  Date of Admission 03/16/2020  Date first seen by Palliative Care 04/22/20  # of days Palliative referral response time 32 Day(s)  # of days IP prior to Palliative referral 0  Clinical Assessment   Palliative Performance Scale Score 10%  Psychosocial & Spiritual Assessment   Palliative Care Outcomes   Patient/Family meeting held? No      Time In: 1710 Time Out: 1800 Time Total: 50 minutes Greater than 50%  of this time was spent counseling and coordinating care related to the above assessment and plan.  Signed by: Micheline Rough, MD   Please contact Palliative Medicine Team phone at [phone] for questions and concerns.  For individual provider: See Shea Evans

## 2020-04-23 NOTE — Progress Notes (Signed)
Pharmacy Antibiotic Note  Deborah Figueroa is a 65 y.o. female admitted on 03/27/2020 with DKA, MSSA bacteremia related to left olecranon bursitis and osteomyelitis s/p I&D on 1/10.  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  She has been on Cefazolin.  For acute decompensation, Pharmacy has been consulted to expand coverage to Cefepime dosing. SCr peak 3 > down to 2.66, CrCl ~ 33 ml/min using TBW Tm 99.9 WBC 7.8  Plan: Increase to Cefepime 2g IV q12h Metronidazole per MD. Follow up renal function, culture results, and clinical course.   Height: 5\' 11"  (180.3 cm) Weight: 101.5 kg (223 lb 12.3 oz) IBW/kg (Calculated) : 70.8  Temp (24hrs), Avg:96.7 F (35.9 C), Min:94.6 F (34.8 C), Max:99.9 F (37.7 C)  Recent Labs  Lab 04/20/20 0501 04/20/20 1020 04/20/20 1313 04/21/20 0241 04/22/20 0504 04/22/20 0505 04/22/20 1200 04/22/20 1924 04/23/20 0249  WBC 13.4*  --   --  17.5* 8.9  --  19.4*  --  7.8  CREATININE 2.83*  --  2.91* 2.86* 2.73*  --  3.02* 2.85* 2.66*  LATICACIDVEN  --  2.0* 1.7 1.4  --  1.7  --   --   --     Estimated Creatinine Clearance: 28 mL/min (A) (by C-G formula based on SCr of 2.66 mg/dL (H)).    No Known Allergies  Antimicrobials this admission: 1/9 Nafcillin >> 1/10 1/10 Cefazolin>> 1/17, 1/23 >> 2/7 (original stop date 2/22) 1/17 Zosyn >> 1/18 1/18 Cefepime>> 1/23, resume 2/7 >> 1/27 fluconazole (thrush) >> 2/7 2/7 Metronidazole >>   Dose adjustments this admission: 2/10 Cefepime dose adjusted for renal function.  Microbiology results: 1/8 BCx: MSSA 1/9: BCx: 2/2 show GPC (No BCID - assumed to be same as previous culture per lab)  1/10 fluid left olecranaon: MSSA 1/11 BCx: NGF 1/16 BCx: NGF 1/17 UCx: 100k col yeast - final 2/7 BCx: ngtd 2/7 UCx: insignificant growth 2/7 MRSA PCR: negative  Thank you for allowing pharmacy to be a  part of this patient's care.  Gretta Arab PharmD, BCPS Clinical Pharmacist [facility] main pharmacy [phone] 04/23/2020 7:43 AM

## 2020-04-23 NOTE — Progress Notes (Addendum)
Palliative care progress note  Reason for consult: Goals of care in light of severe sepsis, respiratory failure requiring intubation  Yesterday, I had attempted to reach out to Ms. Detty's son, however, he did not pick up a voicemail was not available.  This morning, he called back the phone number from which I attempted to call him last night and I was able to connect with him to discuss goals for his mother moving forward.  [name] tells me that he works third shift and that is why he was not available last night.  He said he would appreciate a message if doctors need to talk to him and he does not answer.  I let them know that last night his mailbox was full and he reports that he would work to resolve that issue.  [name] tells me that the most important thing to his mother is being able to spend time with her grandchildren.  She has 5 grandchildren total.  She also has 3 children.  We discussed a little about her complicated social situation prior to admission.  [name] and I then discussed her clinical course over the last month and concern about continued decline despite aggressive medical interventions.  We discussed her acute decline yesterday requiring return to ICU and support by vasopressors as well as mechanical ventilation.  He asked for clarification about exactly what being intubated meant and for more information regarding reasoning for her being intubated.  I explained intubation/mechanical ventilation and reviewed the events of yesterday as well as the thought process behind reasoning for intubation yesterday afternoon.  We also discussed plan from yesterday for attempt by IR to drain abdominal collection and that this is on hold at this time due to acute worsening of her respiratory status/lethargy requiring intubation.  We talked about the multiple comorbidities Ms. Arico is facing and concern that she continues to worsen despite aggressive hospital-based care over the last  month.  [name] reports understanding how sick she is and that he feels the doctors have been doing a good job explaining things to him.  We discussed goal of medical interventions being to allow her to return to a point of doing things that are important to her and I gently discussed my concern this is not going to be recoverable event.  Concept of limitations of care introduced but I did not push this during this initial conversation.  He will continue to speak with his siblings, and we discussed plan to continue current interventions and reassess her situation over the short-term based upon how she does over the next 24 hours.  I let him know that I had not yet rounded on his mother this morning and we would reach out if there were further concerns based upon any clinical changes that have occurred overnight and after I had the chance to review further with Dr. Vaughan Browner from the critical care team.  I then saw and examined Ms. Nawabi.  She remains intubated and sedated and is critically ill.  She remains on pressors.  She received a unit of blood overnight for low hemoglobin.  I touched base with Dr. Vaughan Browner he is planning to also reach out to family to discuss.  Start time: 0815 End time: 0900 Total time: 45 minutes  Greater than 50%  of this time was spent counseling and coordinating care related to the above assessment and plan.  Micheline Rough, MD [place] Team [phone]

## 2020-04-23 NOTE — Progress Notes (Signed)
[facility] Progress Note  Subjective: seen in ICU, 650 UOP yest, no pressors. BP's up and down 90- 030 systolic.   Vitals:   04/23/20 1000 04/23/20 1159 04/23/20 1200 04/23/20 1348  BP: (!) 117/50  (!) 112/46   Pulse: (!) 101  90   Resp: (!) 22  (!) 24   Temp:  99.5 F (37.5 C)    TempSrc:  Axillary    SpO2: 95%  95% 95%  Weight:      Height:        Exam: Gen on vent, sedated No jvd or bruits Chest clear ant and lat RRR no MRG Abd soft ntnd no mass or ascites +bs GU foley w/ clear light yellow urine Ext diffuse dependent 2+ hip and UE edema Neuro on vent, sedated    Home meds:  - lipitor 40/ fenofibrate 160/ lisinopril - hctz 20-12.5 qd/ toprol xl 100 qd  - insulin glargein 10u qd/ glucotrol 2.5 qam  - zoloft 100 qd/  Robaxin 750 qid/ flexeril prn  - prn's/ vitamins/ supplements    Date                           Creat               eGFR ml/min   2008- 15                    0.7- 1.1   2016- 19                    1.1- 2.0            38- 48 stage IIIb             2020                          2.33                 22 stage IV   09/2019                       1.88                 30   10/2019                       2.06                 28           Mar 19, 2020               2.17                 23   Jan 15                       1.76                 32   Jan 20                       3.08                 16   Jan 31                       2.29  23    Feb 6                        2.65                 20    Feb 7                        2.91                 18     CXR 2/01 - IMPRESSION: Progressive left lower lobe consolidation and left effusion. Mild right lower lobe airspace disease slightly improved.    CXR 2/07 - IMPRESSION: Bibasilar atelectasis or infiltrates are noted. Probable small left pleural effusion.     I/O lastl 1 week = 6.1 L in and 6.5 L out = net even     Wt 1/10 (admit 1/08) - 93kg      Wt 2/07 today - 101 kg        BP's  normal on admit and up until the last 36 hrs BP's are in the mid 80's now        UA 2/07 - cloudy, large LE, prot 30, rare bact/ 6-10 rbc/ >50 wbcs   UNa 37, UCr 66   Renal US 03/21/20 - R 10.6cm, L 10.7 cm, no hydro either side        Assessment/ Plan: 1. AKI on CKD IV - b/l creat 1.8- 2.0, eGFR 28- 30 from 2021. Creat here mostly in 1.8- 2.6 range. Peak creat 2.9 yesterday. Main issue shock, likely sepsis. Jamas Lav may be contributing. Creat down to 2.8 yest and 2.7 yesterday. Good UOP. Suspect ATN in recovery phase. BP's back to normal.  Pharm gave pamidronate x 1 on 2/8. Ca down to 9.6 today. Creat down 2.6 today. Improving. Avoid contrast/ acei/ ARB for this patient. No other suggestions, will sign off.  2. Hypotension - BP's better. off pressors. IV maxipime, flagyl 3. Volume - sig vol excess, lowered IVF's to 40 cc/hr.  4. DKA - resolved 5. L olecranon osteo - per pmd 6. MSSA bacteremia/ L olecranon osteo/ cervical and thoracic osteo+ discitis - on abx per primary team 7. Poor social support - reportedly is homeless. 8. Prognosis - poor prognosis    Rob Febe Champa 04/23/2020, 4:35 PM   Recent Labs  Lab 04/22/20 0504 04/22/20 1200 04/22/20 1924 04/23/20 0249  K 2.8*   < > 2.6* 3.8  BUN 59*   < > 57* 57*  CREATININE 2.73*   < > 2.85* 2.66*  CALCIUM 11.5*   < > 10.6* 9.8  PHOS 5.7*  --   --   --   HGB 6.3*   < > 6.4* 8.1*   < > = values in this interval not displayed.   Inpatient medications: . chlorhexidine gluconate (MEDLINE KIT)  15 mL Mouth Rinse BID  . Chlorhexidine Gluconate Cloth  6 each Topical Daily  . docusate  100 mg Per Tube BID  . hydrocortisone sod succinate (SOLU-CORTEF) inj  50 mg Intravenous Q6H  . insulin aspart  0-9 Units Subcutaneous Q4H  . mouth rinse  15 mL Mouth Rinse 10 times per day  . nystatin   Topical TID  . pantoprazole (PROTONIX) IV  40 mg Intravenous Q12H  . polyethylene glycol  17 g Per Tube Daily  . sertraline  100 mg Per Tube Daily  .  sodium bicarbonate  1,300 mg Per Tube TID  . sodium chloride flush  10-40 mL Intracatheter Q12H   . sodium chloride    . ceFEPime (MAXIPIME) IV Stopped (04/23/20 1039)  . dexmedetomidine (PRECEDEX) IV infusion 0.4 mcg/kg/hr (04/23/20 0800)  . fentaNYL infusion INTRAVENOUS    . metronidazole Stopped (04/23/20 1519)  . norepinephrine (LEVOPHED) Adult infusion 3 mcg/min (04/23/20 1014)  . phytonadione (VITAMIN K) IV Stopped (04/23/20 1312)   acetaminophen (TYLENOL) oral liquid 160 mg/5 mL, dextrose, fentaNYL, liver oil-zinc oxide, ondansetron **OR** ondansetron (ZOFRAN) IV, prochlorperazine, sodium chloride flush

## 2020-04-23 NOTE — Plan of Care (Signed)
  Interdisciplinary Goals of Care Family Meeting   Date carried out:: 04/23/2020  Location of the meeting: Conference room  Member's involved: Physician, Bedside Registered Nurse, Family Member or next of kin and Other: Dr. Domingo Cocking, Palliative care  Durable Power of Attorney or acting medical decision maker: Son, daughters  Discussion: We discussed goals of care for Normanna B Basey .   Met with extended family including son, 2 daughters, son-in-law and daughter-in-law.  Dr. Domingo Cocking from palliative care participated in discussion Discussed clinical course, poor prognosis for meaningful recovery  We have elected to make her DNR.  Continue current level of care with antibiotics, pressors if needed.  Follow results from IR procedure.  Consider one-way extubation when we feel we have optimized her status.  If she were to deteriorate after extubation and family may consider comfort measures.  Code status: Full DNR  Disposition: Continue current acute care  Time spent for the meeting: 35 mins  Praveen Mannam 04/23/2020, 6:23 PM

## 2020-04-23 NOTE — Progress Notes (Signed)
eLink Physician-Brief Progress Note Patient Name: Deborah Figueroa DOB: 03/12/1956 MRN: [id number]   Date of Service  04/23/2020  HPI/Events of Note  Called for worsening ventilator dyssynchrony. Patient is on PRVC 560x24, 5, 40%. They are breathing 32/min.  Last ABG was y/day and showed 7.29/22/302/10/BE -15.  Patient had been on a bicarb drip until earlier today with improving serum HCO3 values, but this has since been stopped and transitioned to oral bicarbonate tabs.  ABG 7.28/19/70/8.8/BE -16.5.   eICU Interventions  Ventilator dyssynchrony is being driven by her metabolic acidosis.  Base deficit is ~ 500 mEq.  Increased ventilator rate and volume to 35 x 620.  Give 2x amps of NaHCO3 now, then start isotonic sodium bicarbonate infusion at 125cc/hr.  Recheck ABG at 0300 hrs.     Intervention Category Major Interventions: Respiratory failure - evaluation and management;Acid-Base disturbance - evaluation and management  Charlott Rakes 04/23/2020, 9:51 PM

## 2020-04-23 NOTE — Progress Notes (Signed)
CRITICAL VALUE ALERT  Critical Value:   Results for Deborah Figueroa, Deborah Figueroa (MRN [id number]) as of 04/23/2020 22:06  Ref. Range 04/23/2020 21:50  FIO2 Unknown 40.00  pH, Arterial Latest Ref Range: 7.350 - 7.450  7.279 (L)  pCO2 arterial Latest Ref Range: 32.0 - 48.0 mmHg 19.6 (LL)  pO2, Arterial Latest Ref Range: 83.0 - 108.0 mmHg 70.0 (L)  Acid-base deficit Latest Ref Range: 0.0 - 2.0 mmol/L 16.5 (H)  Bicarbonate Latest Ref Range: 20.0 - 28.0 mmol/L 8.8 (L)  O2 Saturation Latest Units: % 91.4  Patient temperature Unknown 99.5  Allens test (pass/fail) Latest Ref Range: PASS  PASS   Date & Time Notied:  04-23-20 @ 2206  Provider Notified: Dr. Wilder Glade Elink  Orders Received/Actions taken:

## 2020-04-23 NOTE — Progress Notes (Signed)
INFECTIOUS DISEASE PROGRESS NOTE  ID: Deborah Figueroa is a 65 y.o. female with  Principal Problem:   MSSA bacteremia Active Problems:   Hypercalcemia   Depression, major, single episode, moderate (HCC)   DKA (diabetic ketoacidosis) (HCC)   Acute pain of right shoulder   Left elbow pain   Acute kidney injury ([place])   Weakness   Recurrent falls   Sepsis due to methicillin susceptible Staphylococcus aureus ([place])   Osteomyelitis of left elbow (HCC)   Osteomyelitis of left ulna (HCC)   Sepsis ([place])   Blood in the stool   Anemia due to GI blood loss   Coagulopathy (HCC)   Duodenal ulcer   Acute gastric ulcer without hemorrhage or perforation   Adenomatous polyp of ascending colon   Adenomatous polyp of descending colon   Stercoral ulcer of rectum   Fecal impaction (HCC)   Lung mass   Thyroid nodule   Pancreatic mass   Aortic atherosclerosis ([place])   Discitis   Vertebral osteomyelitis ([place])   Lung abscess ([place])   Shock ([place])  Subjective: On vent   Abtx:  Anti-infectives (From admission, onward)   Start     Dose/Rate Route Frequency Ordered Stop   04/23/20 1000  ceFEPIme (MAXIPIME) 2 g in sodium chloride 0.9 % 100 mL IVPB        2 g 200 mL/hr over 30 Minutes Intravenous Every 12 hours 04/23/20 0757     04/20/20 2200  metroNIDAZOLE (FLAGYL) IVPB 500 mg        500 mg 100 mL/hr over 60 Minutes Intravenous Every 8 hours 04/20/20 1432     04/20/20 1530  ceFEPIme (MAXIPIME) 2 g in sodium chloride 0.9 % 100 mL IVPB  Status:  Discontinued        2 g 200 mL/hr over 30 Minutes Intravenous Every 24 hours 04/20/20 1434 04/23/20 0757   04/20/20 1500  piperacillin-tazobactam (ZOSYN) IVPB 3.375 g  Status:  Discontinued        3.375 g 12.5 mL/hr over 240 Minutes Intravenous Every 8 hours 04/20/20 1423 04/20/20 1429   04/20/20 1400  metroNIDAZOLE (FLAGYL) IVPB 500 mg  Status:  Discontinued        500 mg 100 mL/hr over 60 Minutes Intravenous Every 8 hours 04/20/20 1313 04/20/20 1421    04/20/20 1400  ceFEPIme (MAXIPIME) 2 g in sodium chloride 0.9 % 100 mL IVPB  Status:  Discontinued        2 g 200 mL/hr over 30 Minutes Intravenous Every 24 hours 04/20/20 1330 04/20/20 1356   04/19/20 2200  ceFAZolin (ANCEF) IVPB 2g/100 mL premix  Status:  Discontinued        2 g 200 mL/hr over 30 Minutes Intravenous Every 12 hours 04/19/20 1501 04/19/20 1513   04/19/20 1600  ceFAZolin (ANCEF) IVPB 2g/100 mL premix  Status:  Discontinued        2 g 200 mL/hr over 30 Minutes Intravenous Every 8 hours 04/19/20 1513 04/20/20 1316   04/17/20 1000  fluconazole (DIFLUCAN) IVPB 100 mg        100 mg 50 mL/hr over 60 Minutes Intravenous Every 24 hours 04/16/20 1229 04/20/20 1215   04/08/2020 1400  ceFAZolin (ANCEF) IVPB 2g/100 mL premix  Status:  Discontinued        2 g 200 mL/hr over 30 Minutes Intravenous Every 8 hours 03/20/2020 0735 04/19/20 1501   04/09/20 1300  fluconazole (DIFLUCAN) tablet 100 mg  Status:  Discontinued  100 mg Oral Daily 04/09/20 1222 04/16/20 1227   04/06/20 0000  ceFAZolin (ANCEF) IVPB        2 g Intravenous Every 8 hours 04/06/20 1604 05/16/20 2359   04/05/20 1800  ceFAZolin (ANCEF) IVPB 2g/100 mL premix  Status:  Discontinued        2 g 200 mL/hr over 30 Minutes Intravenous Every 12 hours 04/05/20 1345 03/28/2020 0735   04/05/20 1400  ceFAZolin (ANCEF) IVPB 2g/100 mL premix  Status:  Discontinued        2 g 200 mL/hr over 30 Minutes Intravenous Every 8 hours 04/05/20 1301 04/05/20 1345   04/02/20 0522  ceFEPIme (MAXIPIME) 2 g in sodium chloride 0.9 % 100 mL IVPB  Status:  Discontinued        2 g 200 mL/hr over 30 Minutes Intravenous Every 24 hours 04/01/20 0932 04/05/20 1215   03/31/20 1800  ceFEPIme (MAXIPIME) 2 g in sodium chloride 0.9 % 100 mL IVPB  Status:  Discontinued        2 g 200 mL/hr over 30 Minutes Intravenous Every 12 hours 03/31/20 1244 04/01/20 0932   03/30/20 1030  piperacillin-tazobactam (ZOSYN) IVPB 3.375 g  Status:  Discontinued        3.375  g 12.5 mL/hr over 240 Minutes Intravenous Every 8 hours 03/30/20 1006 03/31/20 1236   03/29/20 1300  fluconazole (DIFLUCAN) tablet 150 mg  Status:  Discontinued        150 mg Oral  Once 03/29/20 1201 04/09/20 1222   04/07/2020 1145  ceFAZolin (ANCEF) IVPB 2g/100 mL premix  Status:  Discontinued        2 g 200 mL/hr over 30 Minutes Intravenous Every 8 hours 03/30/2020 1055 03/30/20 0951   04/02/2020 1100  nafcillin 12 g in sodium chloride 0.9 % 500 mL continuous infusion  Status:  Discontinued        12 g 20.8 mL/hr over 24 Hours Intravenous Every 24 hours 04/04/2020 0949 04/03/2020 1055   03/22/20 1500  nafcillin 2 g in sodium chloride 0.9 % 100 mL IVPB  Status:  Discontinued        2 g 200 mL/hr over 30 Minutes Intravenous Every 4 hours 03/22/20 1354 03/19/2020 0949   03/22/20 1345  nafcillin injection 2 g  Status:  Discontinued        2 g Intravenous Every 4 hours 03/22/20 1344 03/22/20 1354   03/22/20 1300  ceFAZolin (ANCEF) IVPB 2g/100 mL premix  Status:  Discontinued        2 g 200 mL/hr over 30 Minutes Intravenous Every 8 hours 03/22/20 1257 03/22/20 1344      Medications:  Scheduled: . chlorhexidine gluconate (MEDLINE KIT)  15 mL Mouth Rinse BID  . Chlorhexidine Gluconate Cloth  6 each Topical Daily  . docusate  100 mg Per Tube BID  . hydrocortisone sod succinate (SOLU-CORTEF) inj  50 mg Intravenous Q6H  . insulin aspart  0-9 Units Subcutaneous Q4H  . mouth rinse  15 mL Mouth Rinse 10 times per day  . nystatin   Topical TID  . pantoprazole (PROTONIX) IV  40 mg Intravenous Q12H  . polyethylene glycol  17 g Per Tube Daily  . sertraline  100 mg Per Tube Daily  . sodium bicarbonate  1,300 mg Per Tube TID  . sodium chloride flush  10-40 mL Intracatheter Q12H    Objective: Vital signs in last 24 hours: Temp:  [94.6 F (34.8 C)-101 F (38.3 C)] 99.5 F (37.5 C) (  02/10 1159) Pulse Rate:  [61-124] 90 (02/10 1200) Resp:  [20-27] 24 (02/10 1200) BP: (70-144)/(41-75) 112/46 (02/10  1200) SpO2:  [95 %-100 %] 95 % (02/10 1348) FiO2 (%):  [30 %-100 %] 30 % (02/10 1348)   General appearance: no distress Resp: rhonchi anterior - bilateral Cardio: regular rate and rhythm GI: normal findings: bowel sounds normal and soft, non-tender Extremities: edema anasarca  Lab Results Recent Labs    04/22/20 1200 04/22/20 1450 04/22/20 1924 04/23/20 0249  WBC 19.4*  --   --  7.8  HGB 8.1*   < > 6.4* 8.1*  HCT 26.0*   < > 20.1* 24.9*  NA 144   < > 143 143  K 3.4*   < > 2.6* 3.8  CL 109  --  105 105  CO2 9*  --  14* 17*  BUN 52*  --  57* 57*  CREATININE 3.02*  --  2.85* 2.66*   < > = values in this interval not displayed.   Liver Panel Recent Labs    04/22/20 0504 04/23/20 0249  PROT 5.5* 4.8*  ALBUMIN 1.6* 1.8*  AST 10* 10*  ALT <5 <5  ALKPHOS 58 54  BILITOT 0.7 0.8   Sedimentation Rate No results for input(s): ESRSEDRATE in the last 72 hours. C-Reactive Protein No results for input(s): CRP in the last 72 hours.  Microbiology: Recent Results (from the past 240 hour(s))  Culture, blood (x 2)     Status: None (Preliminary result)   Collection Time: 04/20/20  9:17 AM   Specimen: BLOOD  Result Value Ref Range Status   Specimen Description   Final    BLOOD RIGHT ANTECUBITAL Performed at [facility] [address] [place], [place] [address]    Special Requests   Final    BOTTLES DRAWN AEROBIC ONLY Blood Culture adequate volume Performed at [facility] [address] [place], [place] [address]    Culture   Final    NO GROWTH 3 DAYS Performed at [facility], [place] [address] [place], [place] [address]    Report Status PENDING  Incomplete  MRSA PCR Screening     Status: None   Collection Time: 04/20/20 11:06 AM   Specimen: Nasal Mucosa; Nasopharyngeal  Result Value Ref Range Status   MRSA by PCR NEGATIVE NEGATIVE Final    Comment:        The GeneXpert MRSA Assay (FDA approved for NASAL  specimens only), is one component of a comprehensive MRSA colonization surveillance program. It is not intended to diagnose MRSA infection nor to guide or monitor treatment for MRSA infections. Performed at [facility], [place] [address] [place], [place] [address]   Culture, Urine     Status: Abnormal   Collection Time: 04/20/20 12:31 PM   Specimen: Urine, Clean Catch  Result Value Ref Range Status   Specimen Description   Final    URINE, CLEAN CATCH Performed at [facility], [place] [address] [place], [place] [address]    Special Requests   Final    NONE Performed at [facility], [place] [address] [place], [place] [address]    Culture (A)  Final    <10,000 COLONIES/mL INSIGNIFICANT GROWTH Performed at [facility] [address] [place], [place] [address]    Report Status 04/22/2020 FINAL  Final  Culture, blood (x 2)     Status: None (Preliminary result)   Collection  Time: 04/20/20  1:13 PM   Specimen: BLOOD LEFT HAND  Result Value Ref Range Status   Specimen Description   Final    BLOOD LEFT HAND Performed at [facility] [address] [place], [place] [address]    Special Requests   Final    BOTTLES DRAWN AEROBIC ONLY Blood Culture adequate volume Performed at [facility] [address], [place], [place] [address]    Culture   Final    NO GROWTH 3 DAYS Performed at [facility], [place] [address], [place],  [address]    Report Status PENDING  Incomplete    Studies/Results: DG Abd 1 View  Result Date: 04/22/2020 CLINICAL DATA:  Enteric catheter placement EXAM: ABDOMEN - 1 VIEW COMPARISON:  04/20/2020 FINDINGS: Supine frontal view of the abdomen and pelvis is obtained, excluding the lower pelvis by collimation. Tip and side port of an enteric catheter project over the gastric antrum. There is a paucity of bowel gas. No masses or abnormal calcifications.  IMPRESSION: 1. Enteric catheter tip and side port projecting over the gastric antrum. Electronically Signed   By: Randa Ngo M.D.   On: 04/22/2020 15:52   Portable Chest x-ray  Result Date: 04/22/2020 CLINICAL DATA:  Intubated EXAM: PORTABLE CHEST 1 VIEW COMPARISON:  04/20/2020 FINDINGS: Single frontal view of the chest demonstrates endotracheal tube overlying tracheal air column, tip 3.5 cm above carina. Left subclavian central venous catheter tip overlies superior vena cava. Cardiac silhouette is unremarkable. Right apical mass again identified concerning for neoplasm. Bibasilar consolidation and small left pleural effusion again noted, unchanged. No pneumothorax. No acute bony abnormality. IMPRESSION: 1. Support devices as above. 2. Persistent bibasilar consolidation and left effusion. 3. Stable right apical mass suspicious for neoplasm. Electronically Signed   By: Randa Ngo M.D.   On: 04/22/2020 15:52     Assessment/Plan: Pancreatitis MSSA L olecranon bursitis, osteomyelitis,(1-10) C6-T2 and T7/8 discitis, osteo(2-6) DM2 uncontrolled (A1C 10.4) Lung abscess Rectovaginal fistula CKD 3B  Total days of antibiotics:33ancef -->cefepime/flagyl  Transfused last pm.  Now intubated (yesterday) Cr remains elevated Spoke with CV- TEE not indicated at this point. No repeat BCx+. Have discussed with pharm and eval literature, cefepime will cover her MSSA.  She appears to be worsening.  Appreciate Dr Kirstie Mirza eval           Bobby Rumpf MD, FACP Infectious Diseases (pager) [phone] [email or web address] 04/23/2020, 2:45 PM  LOS: 33 days

## 2020-04-23 NOTE — Progress Notes (Signed)
Nutrition Follow-up  DOCUMENTATION CODES:   Not applicable  INTERVENTION:  - if patient to remain intubated >/= 24 hours and TF within [place], recommend Vital AF 1.2 @ 45 ml/hr with 45 ml Prosource TF x5/day. - this regimen will provide 1496 kcal, 136 grams protein, and 973 ml free water.    NUTRITION DIAGNOSIS:   Increased nutrient needs related to acute illness (sepsis) as evidenced by estimated needs. -ongoing  GOAL:   Patient will meet greater than or equal to 90% of their needs -unmet  MONITOR:   Vent status,Labs,Weight trends,Skin  REASON FOR ASSESSMENT:   Ventilator  ASSESSMENT:   34 YOF presented to ED with weakness and after a fall. Pt admitted for DKA, hypercalcemia, and sepsis in the setting of MSSA bacteremia, left olecranon bursitis and osteomyelitis. Pt has also developed acute respiratory failure d/t hospital-acquired pneumonia as well as thrush and a right lung mass was discovered. PMH of DM-2, chronic hypercalcemia d/t hypothyroidism, HTN, HLD and recurrent falls.  Pt is currently experiencing homelessness and does not have insurance, therefore cannot be discharged home due to IV antibiotics regimen for 6 weeks.  Patient was intubated yesterday at ~1600 and OGT placed at that time (gastric). OGT currently to LIS. No family/visitors present at this time.   Patient discussed in rounds this AM and with RN after RD visited patient's room. Plan for family meeting for [place] today.   Weight stable 10/10-1/28 and then weighed again on 2/7 and weight then was +17 lb. During NFPE, noted deep pitting edema to BLE and moderate to deep pitting edema to BUE.    Patient is currently intubated on ventilator support MV: 12.2 L/min Temp (24hrs), Avg:97 F (36.1 C), Min:94.6 F (34.8 C), Max:101 F (38.3 C) Propofol: none BP: 99/39 and MAP: 58  Labs reviewed; CBGs: 106, 108, 108 mg/dl, BUN: 57 mg/dl, creatinine: 2.66 mg/dl, GFR: 19 ml/min.  Medications reviewed; 100 mg colace  BID, 40 mg IV lasix x1 dose 2/9, 50 mg solu-cortef QID, sliding scale novolog, 2 g IV Mg sulfate x1 run 2/9, 40 mg IV protonix BID, 10 mg IV vitamin K x1 dose 2/10 and x1 dose 2/11, 17 g mirlax/day, 10 mEq IV KCl x6 runs 2/9, 1300 mg sodium bicarb TID.  IVF; 150 mEq sodium bicarb in sterile water @ 40 ml/hr. Drips; precedex @ 0.2 mcg/kg/hr, levo @ 3 mcg/min.    Diet Order:   Diet Order            Diet NPO time specified  Diet effective now                 EDUCATION NEEDS:   No education needs have been identified at this time  Skin:  Skin Assessment: Skin Integrity Issues: Skin Integrity Issues:: Stage II Stage II: R buttocks Incisions: open incision/wound on buttocks (bilateral, mid), close incision on left arm  Last BM:  2/6  Height:   Ht Readings from Last 1 Encounters:  04/22/20 5\' 11"  (1.803 m)    Weight:   Wt Readings from Last 1 Encounters:  04/20/20 101.5 kg    Estimated Nutritional Needs:  Kcal:  1503 kcal Protein:  130-141 grams Fluid:  >2.4 L      Jarome Matin, MS, RD, LDN, CNSC Inpatient Clinical Dietitian RD pager # available in [place]  After hours/weekend pager # available in [facility]

## 2020-04-23 NOTE — Progress Notes (Addendum)
NAME:  Deborah Figueroa, MRN:  [id number], DOB:  [birth year], LOS: 73 ADMISSION DATE:  03/19/2020, CONSULTATION DATE: 2/7 REFERRING MD:  Dr. Sarajane Jews, CHIEF COMPLAINT:  Hypotension   Brief History:  65 y/o F, homeless, admitted 1/8 with reports of weakness, falls, increased urination and thirst.  She was found to have DKA on presentation and was started on insulin / IVF and admitted per [facility].  She had a leukocytosis on presentation and left elbow pain which led to identification of sepsis secondary to MSSA bacteremia.  Imaging identified left olecranon bursitis and osteomyelitis.  She underwent I&D on 1/10 with fluid / tissue from the L olecranon grew MSSA. Repeat cultures subsequently cleared.  Plan is to complete 6 weeks of IV antibiotics (likely inpatient due to homelessness / lack of insurance). She developed a normocytic normochromic anemia with a positive FOBT, epistaxis and GI was consulted as well as ONC.  She had an EGD + colonoscopy on 1/27 which showed stercoral ulcer and gastric ulcer.  ONC felt her anemia was in the setting of sepsis.  She was noted to have coagulopathy and treated with vitamin K.  She developed hypoxemic respiratory distress in the setting of PNA.  She has had extensive radiographic evaluation which has raised concern for RUL pulmonary mass, enlarging left pleural effusion, thyroid nodule, & possible pathologic fractures of spine.  She developed worsening AKI, AGMA & hypotension prompting PCCM consultation on 2/7.  Past Medical History:  Homelessness  Seasonal Allergies  Depression  Cellulitis  HTN HLD DM  Significant Hospital Events:  1/08 Admit  1/10 ID of left arm per Ortho  1/27 EGD + Colonoscopy  2/07 PCCM consulted  2/08 Hypotension resolved with bicarbonate administration, off pressors. FFP for INR >10 2/09 Hgb 6.3, PRBC. AKI. Electrolyte disturbances. Intubated for resp distress  Consults:  ONC  GI  Ortho  ID NSGY PCCM Nephrology  Procedures:   OETT 2/9 >> Lt PICC 2/9>>   Significant Diagnostic Tests:   CT Head 1/6 >> negative for acute intracranial abnormality, forehead periorbital hematoma  Renal US 1/8 >> negative   CT L Humerus / Forearm 1/10 >> osteomyelitis of the olecranon with adjacent inflammatory phlegmon and abscess in the region of the olecranon bursa, evidence of pyomyositis in the flexor digitorum superficialis muscle of the proximal forearm, small abscess along the ventral aspect of the lower bisceps  MRI Brain 1/9 >> no evidence of recent infarction   MRI L Elbow 1/9 >> limited study, osteomyelitis of the olecranon and proximal ulna, moderate joint effusion concerning for septic arthritis, proximal forearm flexor compartment intramuscular fluid collection concerning for abscess, additional incompletely visualized small fluid collections in the distal upper arm   ECHO 1/11 >> LVEF 65-70%, LV with hyperdynamic function, mild LVH, RV systolic function normal,   TEE 1/11 >> no evidence of vegetation / infective endocarditis   CT Chest 1/17 >> right apical lung mass 2.7 x 2.3 x2 cm concerning for neoplasm potentially with chest wall invasion, bilateral lower lobe infiltrates c/w atelectasis and pneumonia.  Ill defined peripancreatic tissue planes raising question of pancreatitis at the tail, left thyroid nodule 2.2 x 1.9 cm.    [place] ABD Limited 1/23 >> cholelithiasis with contracted gallbladder around at least a single gallstone, normal sonographic appearance of the liver  CT Chest w/o 2/1 >> limited evaluation with non-contrasted study, persistent bibasilar airspace disease with increasing left effusion, possible right apical lung mass with mottled appearance of C7, T1 vertebral bodies with  a possible pathologic fracture through the T1 transverse process on the right. Abnormal appearance of T7, T8 vertebral bodies.  Very abnormal appearance of the partially visualized pancreas with questionable pancreatic tail mass, new  fluid abutting the proximal stomach, non-specific left flank edema, 2.7cm left sided thyroid nodule, non-specific bilateral glenohumeral joint effusions.  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  CT ABD / Pelvis 2/7 >> acute pancreatitis, multiple pancreatic pseudocysts, mass-like prominence of the tail of the pancreas, multiple nodular densities extending inferiorly from the tail of the pancreas although metastasis is not excluded, small bilateral pleural effusions L>R with bibasilar atelectasis, no bowel obstruction, normal appendix  Micro Data:  COVID 1/8 >> negative  HIV 1/8 >> negative  BCID 1/8 >> staph detected  MRSA PCR 1/10 >> positive  BCx2 1/8 >> MSSA L Olecranon 1/10 >> MSSA  UC 1/17 >> greater than 100k yeast  BCx2 1/16 >> negative  MRSA PCR 2/7 >> negative   Antimicrobials:  Cefazolin 1/9 >> 1/23  Nafcillin 1/9 >> 1/10  Cefepime 1/18 >>  Diflucan 2/4 >> 2/7 Flagyl 2/7 >>  Interim History / Subjective:   Intubated yesterday evening for respiratory distress.  Remains on pressors.  Levophed at 3 mcg Got additional 1 unit PRBC overnight for low hemoglobin Pressure support weans today morning.  Objective   Blood pressure (!) 114/50, pulse 93, temperature (!) 101 F (38.3 C), temperature source Axillary, resp. rate (!) 27, height 5\' 11"  (1.803 m), weight 101.5 kg, SpO2 96 %.    Vent Mode: PSV FiO2 (%):  [30 %-100 %] 30 % Set Rate:  [24 bmp] 24 bmp Vt Set:  [560 mL] 560 mL PEEP:  [5 cmH20] 5 cmH20 Pressure Support:  [5 cmH20] 5 cmH20 Plateau Pressure:  [15 cmH20-19 cmH20] 19 cmH20   Intake/Output Summary (Last 24 hours) at 04/23/2020 0948 Last data filed at 04/23/2020 0150 Gross per 24 hour  Intake 3071.1 ml  Output 350 ml  Net 2721.1 ml   Filed Weights   03/22/2020 0952 04/09/2020 1400 04/20/20 1100  Weight: 93.9  kg 93.9 kg 101.5 kg    Examination: Gen:      No acute distress, chronically ill-appearing HEENT:  EOMI, sclera anicteric, ETT Neck:     No masses; no thyromegaly Lungs:    Clear to auscultation bilaterally; normal respiratory effort CV:         Regular rate and rhythm; no murmurs Abd:      + bowel sounds; soft, non-tender; no palpable masses, no distension Ext:    Anasarca Skin:      Warm and dry; no rash Neuro: Sedated, arousable.  Follows commands  Lab/imaging reviewed. Significant for BUN/creatinine 57/2.66 Hemoglobin 8.1, INR 1.7 No new imaging  Resolved Hospital Problem list   Hypercalcemia  Hypokalemia   Assessment & Plan:   Severe Sepsis in setting of left Olecranon Osteomyelitis with subsequent MSSA Bacteremia  Concern for intra-abdominal infection with fluid collection  Component of relative adrenal insufficiency (despite prior assessment).  -Continue Levophed, wean as tolerated -continue stress dose steroids  -continue cefepime, flagyl  -IR to sample intra-abdominal fluid collection for culture today.   Acute Hypoxemic Respiratory Failure  PNA  Bibasilar infiltrates / consolidation with air bronchograms on imaging.  Pressure support weans as tolerated.  AKI  AG / NG Metabolic Acidosis DC  sodium bicarb infusion as she appears grossly fluid overloaded. Continue bicarb tablets via tube Monitor urine output and creatinine.  Not a good candidate for dialysis  Normocytic Normochromic Anemia.  No clear bleeding source identified Nonbleeding ulcer identified on EGD Follow CBC, transfuse for hemoglobin less than 7  Coagulopathy.  No evidence of DIC INR 9.6 2/7, not on anticoagulation, Rx with 3 units FFP + vitamin K -follow INR -appreciate Hematology  -SCD's for DVT prophylaxis   Gastric Ulcer  Biopsy negative  -PPI   Poorly Controlled DM  Hgb A1c 10.4 on admit.  Continue SSI  Pancreatitis per CT 2/7, with possible Pseudocyts -trend lipase -follow  abdominal exam  -appreciate IR assistance with drain placement / fluid sampling   Right Apical Lung Mass Never -smoker, but second hand smoke as a child DDx includes infectious / inflammatory etiology vs malignancy given other imaging findings -treat as infection, will need follow up chest / abd imaging  -may need biopsy if residual mass   Questionable C7, T1 Vertebral Body Pathologic Fracture ? Infectious vs malignant etiology  -follow up imaging   Thyroid Nodule  2.7 cm left thyroid nodule -will need biopsy once through acute illness   Hx HTN, HLD -hold home antihypertensives   Questionable Rectovaginal Fistula -will need further evaluation   Best practice (evaluated daily)  Diet: clear liquid diet  Pain/Anxiety/Delirium protocol (if indicated): n/a  VAP protocol (if indicated): n/a  DVT prophylaxis: SCD's GI prophylaxis: PPI  Glucose control: SSI  Mobility: as tolerated  Disposition: SDU   Goals of Care:  Last date of multidisciplinary goals of care discussion: pending Family and staff present:  Summary of discussion: pending  Follow up goals of care discussion due: 2/14  Code Status: Full Code. Patient reports her son would make decisions for her if she could not. She has 3 adult children.    Family: Son, [name] and daughter-in-law updated via telephone 2/10. He shares that she has had longstanding issues with dizziness & falls.  She has struggled for years with homelessness and financial issues.  He says despite these issues, she always remains optimistic and positive. He relays that her hotel room was very disheveled and there were un-cashed checks in the room etc. Reviewed patients multiple medical problems and he has a good understanding of issues at hand.  Prognosis is poor and not sure she will be able to survive this  Family meeting planned for later in the day 2/10  Critical care time:   The patient is critically ill with multiple organ system failure and  requires high complexity decision making for assessment and support, frequent evaluation and titration of therapies, advanced monitoring, review of radiographic studies and interpretation of complex data.   Critical Care Time devoted to patient care services, exclusive of separately billable procedures, described in this note is 55 minutes.   [name] MD [facility] See Amion for pager  If no response to pager , please call [phone] until 7pm After 7:00 pm call Elink  [phone] 04/23/2020, 9:49 AM

## 2020-04-23 NOTE — Progress Notes (Signed)
Assisted with transporting PT to [facility] CT while on vent at 100% Fi02- uneventful.

## 2020-04-24 ENCOUNTER — Inpatient Hospital Stay (HOSPITAL_COMMUNITY): Payer: Medicaid Other

## 2020-04-24 DIAGNOSIS — J9601 Acute respiratory failure with hypoxia: Secondary | ICD-10-CM

## 2020-04-24 DIAGNOSIS — E1165 Type 2 diabetes mellitus with hyperglycemia: Secondary | ICD-10-CM

## 2020-04-24 DIAGNOSIS — M4643 Discitis, unspecified, cervicothoracic region: Secondary | ICD-10-CM

## 2020-04-24 DIAGNOSIS — E1122 Type 2 diabetes mellitus with diabetic chronic kidney disease: Secondary | ICD-10-CM

## 2020-04-24 DIAGNOSIS — K922 Gastrointestinal hemorrhage, unspecified: Secondary | ICD-10-CM

## 2020-04-24 LAB — GLUCOSE, CAPILLARY
Glucose-Capillary: 128 mg/dL — ABNORMAL HIGH (ref 70–99)
Glucose-Capillary: 142 mg/dL — ABNORMAL HIGH (ref 70–99)
Glucose-Capillary: 126 mg/dL — ABNORMAL HIGH (ref 70–99)
Glucose-Capillary: 145 mg/dL — ABNORMAL HIGH (ref 70–99)

## 2020-04-24 LAB — BASIC METABOLIC PANEL
Anion gap: 24 — ABNORMAL HIGH (ref 5–15)
Anion gap: 23 — ABNORMAL HIGH (ref 5–15)
BUN: 58 mg/dL — ABNORMAL HIGH (ref 8–23)
BUN: 53 mg/dL — ABNORMAL HIGH (ref 8–23)
CO2: 13 mmol/L — ABNORMAL LOW (ref 22–32)
CO2: 18 mmol/L — ABNORMAL LOW (ref 22–32)
Calcium: 9.8 mg/dL (ref 8.9–10.3)
Calcium: 8.2 mg/dL — ABNORMAL LOW (ref 8.9–10.3)
Chloride: 107 mmol/L (ref 98–111)
Chloride: 104 mmol/L (ref 98–111)
Creatinine, Ser: 2.95 mg/dL — ABNORMAL HIGH (ref 0.44–1.00)
Creatinine, Ser: 2.6 mg/dL — ABNORMAL HIGH (ref 0.44–1.00)
GFR, Estimated: 17 mL/min — ABNORMAL LOW (ref >60)
GFR, Estimated: 20 mL/min — ABNORMAL LOW (ref >60)
Glucose, Bld: 230 mg/dL — ABNORMAL HIGH (ref 70–99)
Glucose, Bld: 148 mg/dL — ABNORMAL HIGH (ref 70–99)
Potassium: 2.5 mmol/L — CL (ref 3.5–5.1)
Potassium: 2.7 mmol/L — CL (ref 3.5–5.1)
Sodium: 144 mmol/L (ref 135–145)
Sodium: 145 mmol/L (ref 135–145)

## 2020-04-24 LAB — CBC
HCT: 23.6 % — ABNORMAL LOW (ref 36.0–46.0)
Hemoglobin: 7.6 g/dL — ABNORMAL LOW (ref 12.0–15.0)
MCH: 28.4 pg (ref 26.0–34.0)
MCHC: 32.2 g/dL (ref 30.0–36.0)
MCV: 88.1 fL (ref 80.0–100.0)
Platelets: 158 10*3/uL (ref 150–400)
RBC: 2.68 MIL/uL — ABNORMAL LOW (ref 3.87–5.11)
RDW: 18.9 % — ABNORMAL HIGH (ref 11.5–15.5)
WBC: 8.4 10*3/uL (ref 4.0–10.5)
nRBC: 0 % (ref 0.0–0.2)

## 2020-04-24 LAB — BLOOD GAS, ARTERIAL
Acid-base deficit: 10.4 mmol/L — ABNORMAL HIGH (ref 0.0–2.0)
Bicarbonate: 12.3 mmol/L — ABNORMAL LOW (ref 20.0–28.0)
Drawn by: 23281
FIO2: 40
MECHVT: 620 mL
O2 Saturation: 94.7 %
PEEP: 5 cmH2O
Patient temperature: 99
RATE: 35 resp/min
pCO2 arterial: 18 mmHg — CL (ref 32.0–48.0)
pH, Arterial: 7.45 (ref 7.350–7.450)
pO2, Arterial: 70.5 mmHg — ABNORMAL LOW (ref 83.0–108.0)

## 2020-04-24 LAB — PHOSPHORUS: Phosphorus: 3.7 mg/dL (ref 2.5–4.6)

## 2020-04-24 LAB — MAGNESIUM: Magnesium: 1.7 mg/dL (ref 1.7–2.4)

## 2020-04-24 MED ORDER — FUROSEMIDE 10 MG/ML IJ SOLN
80.0000 mg | Freq: Once | INTRAMUSCULAR | Status: AC
  Administered 2020-04-24: 80 mg via INTRAVENOUS
  Filled 2020-04-24: qty 8

## 2020-04-24 MED ORDER — SODIUM CHLORIDE 0.9 % IV SOLN: 2.0000 g | INTRAVENOUS | Status: DC

## 2020-04-24 MED ORDER — GLYCOPYRROLATE 0.2 MG/ML IJ SOLN: 0.2000 mg | INTRAMUSCULAR | Status: DC | PRN

## 2020-04-24 MED ORDER — STERILE WATER FOR INJECTION IV SOLN
INTRAVENOUS | Status: DC
  Filled 2020-04-24: qty 150

## 2020-04-24 MED ORDER — HYDROCORTISONE NA SUCCINATE PF 100 MG IJ SOLR: 50.0000 mg | Freq: Two times a day (BID) | INTRAMUSCULAR | Status: DC

## 2020-04-24 MED ORDER — ACETAMINOPHEN 325 MG PO TABS: 650.0000 mg | ORAL_TABLET | Freq: Four times a day (QID) | ORAL | Status: DC | PRN

## 2020-04-24 MED ORDER — ACETAMINOPHEN 650 MG RE SUPP: 650.0000 mg | Freq: Four times a day (QID) | RECTAL | Status: DC | PRN

## 2020-04-24 MED ORDER — GLYCOPYRROLATE 0.2 MG/ML IJ SOLN
0.2000 mg | INTRAMUSCULAR | Status: DC | PRN
Start: 2020-04-24 — End: 2020-04-25
  Administered 2020-04-24: 0.2 mg via INTRAVENOUS
  Filled 2020-04-24: qty 1

## 2020-04-24 MED ORDER — MAGNESIUM SULFATE 2 GM/50ML IV SOLN
2.0000 g | Freq: Once | INTRAVENOUS | Status: AC
  Administered 2020-04-24: 2 g via INTRAVENOUS
  Filled 2020-04-24: qty 50

## 2020-04-24 MED ORDER — GLYCOPYRROLATE 1 MG PO TABS
1.0000 mg | ORAL_TABLET | ORAL | Status: DC | PRN
Start: 2020-04-24 — End: 2020-04-25

## 2020-04-24 MED ORDER — DIPHENHYDRAMINE HCL 50 MG/ML IJ SOLN: 25.0000 mg | INTRAMUSCULAR | Status: DC | PRN

## 2020-04-24 MED ORDER — DEXTROSE 5 % IV SOLN: INTRAVENOUS | Status: DC

## 2020-04-24 MED ORDER — POLYVINYL ALCOHOL 1.4 % OP SOLN: 1.0000 [drp] | Freq: Four times a day (QID) | OPHTHALMIC | Status: DC | PRN

## 2020-04-24 MED ORDER — DEXAMETHASONE SODIUM PHOSPHATE 4 MG/ML IJ SOLN: 4.0000 mg | Freq: Four times a day (QID) | INTRAMUSCULAR | Status: DC | PRN

## 2020-04-24 MED ORDER — CEFAZOLIN SODIUM-DEXTROSE 2-4 GM/100ML-% IV SOLN
2.0000 g | Freq: Two times a day (BID) | INTRAVENOUS | Status: DC
  Administered 2020-04-24: 2 g via INTRAVENOUS
  Filled 2020-04-24 (×2): qty 100

## 2020-04-24 MED ORDER — POTASSIUM CHLORIDE 20 MEQ PO PACK
40.0000 meq | PACK | Freq: Once | ORAL | Status: AC
  Administered 2020-04-24: 40 meq
  Filled 2020-04-24: qty 2

## 2020-04-24 MED ORDER — POTASSIUM CHLORIDE 10 MEQ/100ML IV SOLN
10.0000 meq | INTRAVENOUS | Status: AC
  Administered 2020-04-24 (×4): 10 meq via INTRAVENOUS
  Filled 2020-04-24 (×4): qty 100

## 2020-04-24 MED ORDER — POTASSIUM CHLORIDE 10 MEQ/50ML IV SOLN
10.0000 meq | INTRAVENOUS | Status: AC
  Administered 2020-04-24 (×4): 10 meq via INTRAVENOUS
  Filled 2020-04-24 (×4): qty 50

## 2020-04-25 LAB — CULTURE, BLOOD (ROUTINE X 2)
Culture: NO GROWTH
Culture: NO GROWTH
Special Requests: ADEQUATE
Special Requests: ADEQUATE

## 2020-04-28 LAB — AEROBIC/ANAEROBIC CULTURE W GRAM STAIN (SURGICAL/DEEP WOUND): Culture: NO GROWTH

## 2020-04-29 LAB — AEROBIC/ANAEROBIC CULTURE W GRAM STAIN (SURGICAL/DEEP WOUND): Culture: NO GROWTH

## 2020-04-30 LAB — LIPASE, FLUID

## 2020-05-12 NOTE — Progress Notes (Signed)
CRITICAL VALUE ALERT  Critical Value:  Potassium 2.7  Date & Time Notied:  April 25, 2020 @ 9470  Provider Notified: Dr. Elsworth Soho  Orders Received/Actions taken: Awaiting further orders

## 2020-05-12 NOTE — Progress Notes (Signed)
CRITICAL VALUE ALERT  Critical Value:  Results for Deborah Figueroa, Deborah Figueroa (MRN [id number]) as of 09-May-2020 04:30  Ref. Range 05-09-2020 04:00  Delivery systems Unknown VENTILATOR  FIO2 Unknown 40.00  Mode Unknown PRESSURE REGULATED VOLUME CONTROL  VT Latest Units: mL 620  Peep/cpap Latest Units: cm H20 5.0  pH, Arterial Latest Ref Range: 7.350 - 7.450  7.450  pCO2 arterial Latest Ref Range: 32.0 - 48.0 mmHg 18.0 (LL)  pO2, Arterial Latest Ref Range: 83.0 - 108.0 mmHg 70.5 (L)  Acid-base deficit Latest Ref Range: 0.0 - 2.0 mmol/L 10.4 (H)  Bicarbonate Latest Ref Range: 20.0 - 28.0 mmol/L 12.3 (L)  O2 Saturation Latest Units: % 94.7  Patient temperature Unknown 99.0  Collection site Unknown RIGHT RADIAL  Allens test (pass/fail) Latest Ref Range: PASS  PASS   Date & Time Notied: May 09, 2020 @ 0430  Provider Notified: Wyn Forster RN/ Dr. Gillermina Phy  Orders Received/Actions taken: awaiting

## 2020-05-12 NOTE — Progress Notes (Signed)
[facility] Progress Note Patient Name: EDLA PARA DOB: April 10, 1955 MRN: [id number]   Date of Service  05/08/20  HPI/Events of Note  ABG prior to last vent setting change: 7.28/19/70/8.8/BE -16.5.  ABG now (after vent settings change and bicarb administration): 7.45/18/70/12.3/BE -10.4.  Sodium bicarbonate infusion running at 125cc/hr.  Current vent settings: Vent Mode: PRVC FiO2 (%):  [30 %-40 %] 40 % Set Rate:  [24 bmp-35 bmp] 35 bmp Vt Set:  [560 mL-620 mL] 620 mL PEEP:  [5 cmH20] 5 cmH20 Pressure Support:  [5 cmH20-10 cmH20] 10 cmH20 Plateau Pressure:  [13 cmH20-24 cmH20] 21 cmH20    eICU Interventions  Decrease VT from 620 to 560. Decrease RR from 35 to 28.  Repeat ABG at 1100 hrs -- suspect further reduction in RR will be needed at that time.     Intervention Category Major Interventions: Acid-Base disturbance - evaluation and management;Respiratory failure - evaluation and management  Charlott Rakes May 08, 2020, 4:36 AM

## 2020-05-12 NOTE — Progress Notes (Signed)
PT Cancellation Note  Patient Details Name: Deborah Figueroa MRN: [id number] DOB: [birth year]   Cancelled Treatment:     PT deferred this date - pt continues on vent.  Will follow.   Norton Bivins 05/03/2020, 7:47 AM

## 2020-05-12 NOTE — Progress Notes (Addendum)
INFECTIOUS DISEASE PROGRESS NOTE  ID: Deborah Figueroa is a 65 y.o. female with  Principal Problem:   MSSA bacteremia Active Problems:   Hypercalcemia   Depression, major, single episode, moderate (HCC)   DKA (diabetic ketoacidosis) (HCC)   Acute pain of right shoulder   Left elbow pain   Acute kidney injury ([place])   Weakness   Recurrent falls   Sepsis due to methicillin susceptible Staphylococcus aureus ([place])   Osteomyelitis of left elbow (HCC)   Osteomyelitis of left ulna (HCC)   Sepsis ([place])   Blood in the stool   Anemia due to GI blood loss   Coagulopathy (HCC)   Duodenal ulcer   Acute gastric ulcer without hemorrhage or perforation   Adenomatous polyp of ascending colon   Adenomatous polyp of descending colon   Stercoral ulcer of rectum   Fecal impaction (HCC)   Lung mass   Thyroid nodule   Pancreatic mass   Aortic atherosclerosis ([place])   Discitis   Vertebral osteomyelitis ([place])   Lung abscess ([place])   Shock ([place])  Subjective: Off pressors o/n NGT with dark blood  Abtx:  Anti-infectives (From admission, onward)   Start     Dose/Rate Route Frequency Ordered Stop   07-May-2020 2200  ceFEPIme (MAXIPIME) 2 g in sodium chloride 0.9 % 100 mL IVPB        2 g 200 mL/hr over 30 Minutes Intravenous Every 24 hours 07-May-2020 0734     04/23/20 1000  ceFEPIme (MAXIPIME) 2 g in sodium chloride 0.9 % 100 mL IVPB  Status:  Discontinued        2 g 200 mL/hr over 30 Minutes Intravenous Every 12 hours 04/23/20 0757 May 07, 2020 0734   04/20/20 2200  metroNIDAZOLE (FLAGYL) IVPB 500 mg        500 mg 100 mL/hr over 60 Minutes Intravenous Every 8 hours 04/20/20 1432     04/20/20 1530  ceFEPIme (MAXIPIME) 2 g in sodium chloride 0.9 % 100 mL IVPB  Status:  Discontinued        2 g 200 mL/hr over 30 Minutes Intravenous Every 24 hours 04/20/20 1434 04/23/20 0757   04/20/20 1500  piperacillin-tazobactam (ZOSYN) IVPB 3.375 g  Status:  Discontinued        3.375 g 12.5 mL/hr over 240 Minutes  Intravenous Every 8 hours 04/20/20 1423 04/20/20 1429   04/20/20 1400  metroNIDAZOLE (FLAGYL) IVPB 500 mg  Status:  Discontinued        500 mg 100 mL/hr over 60 Minutes Intravenous Every 8 hours 04/20/20 1313 04/20/20 1421   04/20/20 1400  ceFEPIme (MAXIPIME) 2 g in sodium chloride 0.9 % 100 mL IVPB  Status:  Discontinued        2 g 200 mL/hr over 30 Minutes Intravenous Every 24 hours 04/20/20 1330 04/20/20 1356   04/19/20 2200  ceFAZolin (ANCEF) IVPB 2g/100 mL premix  Status:  Discontinued        2 g 200 mL/hr over 30 Minutes Intravenous Every 12 hours 04/19/20 1501 04/19/20 1513   04/19/20 1600  ceFAZolin (ANCEF) IVPB 2g/100 mL premix  Status:  Discontinued        2 g 200 mL/hr over 30 Minutes Intravenous Every 8 hours 04/19/20 1513 04/20/20 1316   04/17/20 1000  fluconazole (DIFLUCAN) IVPB 100 mg        100 mg 50 mL/hr over 60 Minutes Intravenous Every 24 hours 04/16/20 1229 04/20/20 1215   04/08/2020 1400  ceFAZolin (ANCEF) IVPB  2g/100 mL premix  Status:  Discontinued        2 g 200 mL/hr over 30 Minutes Intravenous Every 8 hours 04/03/2020 0735 04/19/20 1501   04/09/20 1300  fluconazole (DIFLUCAN) tablet 100 mg  Status:  Discontinued        100 mg Oral Daily 04/09/20 1222 04/16/20 1227   04/06/20 0000  ceFAZolin (ANCEF) IVPB        2 g Intravenous Every 8 hours 04/06/20 1604 05/16/20 2359   04/05/20 1800  ceFAZolin (ANCEF) IVPB 2g/100 mL premix  Status:  Discontinued        2 g 200 mL/hr over 30 Minutes Intravenous Every 12 hours 04/05/20 1345 03/18/2020 0735   04/05/20 1400  ceFAZolin (ANCEF) IVPB 2g/100 mL premix  Status:  Discontinued        2 g 200 mL/hr over 30 Minutes Intravenous Every 8 hours 04/05/20 1301 04/05/20 1345   04/02/20 0522  ceFEPIme (MAXIPIME) 2 g in sodium chloride 0.9 % 100 mL IVPB  Status:  Discontinued        2 g 200 mL/hr over 30 Minutes Intravenous Every 24 hours 04/01/20 0932 04/05/20 1215   03/31/20 1800  ceFEPIme (MAXIPIME) 2 g in sodium chloride 0.9 % 100  mL IVPB  Status:  Discontinued        2 g 200 mL/hr over 30 Minutes Intravenous Every 12 hours 03/31/20 1244 04/01/20 0932   03/30/20 1030  piperacillin-tazobactam (ZOSYN) IVPB 3.375 g  Status:  Discontinued        3.375 g 12.5 mL/hr over 240 Minutes Intravenous Every 8 hours 03/30/20 1006 03/31/20 1236   03/29/20 1300  fluconazole (DIFLUCAN) tablet 150 mg  Status:  Discontinued        150 mg Oral  Once 03/29/20 1201 04/09/20 1222   04/13/2020 1145  ceFAZolin (ANCEF) IVPB 2g/100 mL premix  Status:  Discontinued        2 g 200 mL/hr over 30 Minutes Intravenous Every 8 hours 04/02/2020 1055 03/30/20 0951   04/04/2020 1100  nafcillin 12 g in sodium chloride 0.9 % 500 mL continuous infusion  Status:  Discontinued        12 g 20.8 mL/hr over 24 Hours Intravenous Every 24 hours 03/27/2020 0949 04/08/2020 1055   03/22/20 1500  nafcillin 2 g in sodium chloride 0.9 % 100 mL IVPB  Status:  Discontinued        2 g 200 mL/hr over 30 Minutes Intravenous Every 4 hours 03/22/20 1354 04/03/2020 0949   03/22/20 1345  nafcillin injection 2 g  Status:  Discontinued        2 g Intravenous Every 4 hours 03/22/20 1344 03/22/20 1354   03/22/20 1300  ceFAZolin (ANCEF) IVPB 2g/100 mL premix  Status:  Discontinued        2 g 200 mL/hr over 30 Minutes Intravenous Every 8 hours 03/22/20 1257 03/22/20 1344      Medications:  Scheduled: . chlorhexidine gluconate (MEDLINE KIT)  15 mL Mouth Rinse BID  . Chlorhexidine Gluconate Cloth  6 each Topical Daily  . docusate  100 mg Per Tube BID  . hydrocortisone sod succinate (SOLU-CORTEF) inj  50 mg Intravenous Q6H  . insulin aspart  0-9 Units Subcutaneous Q4H  . mouth rinse  15 mL Mouth Rinse 10 times per day  . nystatin   Topical TID  . pantoprazole (PROTONIX) IV  40 mg Intravenous Q12H  . polyethylene glycol  17 g Per Tube Daily  .  sertraline  100 mg Per Tube Daily  . sodium bicarbonate  1,300 mg Per Tube TID  . sodium chloride flush  10-40 mL Intracatheter Q12H     Objective: Vital signs in last 24 hours: Temp:  [98.8 F (37.1 C)-99.5 F (37.5 C)] 99.4 F (37.4 C) (02/11 0855) Pulse Rate:  [32-103] 79 (02/11 0700) Resp:  [14-38] 18 (02/11 0700) BP: (86-152)/(35-77) 132/63 (02/11 0700) SpO2:  [90 %-100 %] 93 % (02/11 0700) FiO2 (%):  [30 %-40 %] 40 % (02/11 0443)   General appearance: no distress Resp: clear to auscultation bilaterally Cardio: regular rate and rhythm GI: normal findings: soft, non-tender and abnormal findings:  hypoactive bowel sounds Extremities: edema anasarca  Lab Results Recent Labs    04/23/20 0249 05-05-2020 0453  WBC 7.8 8.4  HGB 8.1* 7.6*  HCT 24.9* 23.6*  NA 143 144  K 3.8 2.5*  CL 105 107  CO2 17* 13*  BUN 57* 58*  CREATININE 2.66* 2.95*   Liver Panel Recent Labs    04/22/20 0504 04/23/20 0249  PROT 5.5* 4.8*  ALBUMIN 1.6* 1.8*  AST 10* 10*  ALT <5 <5  ALKPHOS 58 54  BILITOT 0.7 0.8   Sedimentation Rate No results for input(s): ESRSEDRATE in the last 72 hours. C-Reactive Protein No results for input(s): CRP in the last 72 hours.  Microbiology: Recent Results (from the past 240 hour(s))  Culture, blood (x 2)     Status: None (Preliminary result)   Collection Time: 04/20/20  9:17 AM   Specimen: BLOOD  Result Value Ref Range Status   Specimen Description   Final    BLOOD RIGHT ANTECUBITAL Performed at [facility] [address] [place], [place] [address]    Special Requests   Final    BOTTLES DRAWN AEROBIC ONLY Blood Culture adequate volume Performed at [facility] [address], [place], [place] [address]    Culture   Final    NO GROWTH 4 DAYS Performed at [facility], [place] [address] [place], [place] [address]    Report Status PENDING  Incomplete  MRSA PCR Screening     Status: None   Collection Time: 04/20/20 11:06 AM   Specimen: Nasal Mucosa; Nasopharyngeal  Result Value Ref Range Status   MRSA by PCR NEGATIVE NEGATIVE  Final    Comment:        The GeneXpert MRSA Assay (FDA approved for NASAL specimens only), is one component of a comprehensive MRSA colonization surveillance program. It is not intended to diagnose MRSA infection nor to guide or monitor treatment for MRSA infections. Performed at [facility], [place] [address] [place], [place] [address]   Culture, Urine     Status: Abnormal   Collection Time: 04/20/20 12:31 PM   Specimen: Urine, Clean Catch  Result Value Ref Range Status   Specimen Description   Final    URINE, CLEAN CATCH Performed at [facility], [place] [address] [place], [place] [address]    Special Requests   Final    NONE Performed at [facility], [place] [address], [place], [place] [address]    Culture (A)  Final    <10,000 COLONIES/mL INSIGNIFICANT GROWTH Performed at [facility] [address] [place], [place] [address]    Report Status 04/22/2020 FINAL  Final  Culture, blood (x 2)     Status: None (Preliminary result)   Collection Time: 04/20/20  1:13  PM   Specimen: BLOOD LEFT HAND  Result Value Ref Range Status   Specimen Description   Final    BLOOD LEFT HAND Performed at [facility] [address] [place], [place] [address]    Special Requests   Final    BOTTLES DRAWN AEROBIC ONLY Blood Culture adequate volume Performed at [facility] [address] [place], [place] [address]    Culture   Final    NO GROWTH 4 DAYS Performed at [facility], [place] [address], [place], [place] [address]    Report Status PENDING  Incomplete  Aerobic/Anaerobic Culture (surgical/deep wound)     Status: None (Preliminary result)   Collection Time: 04/23/20  4:16 PM   Specimen: Abdomen; Abdominal Fluid  Result Value Ref Range Status   Specimen Description   Final    ABDOMEN ASPIRATE 1 Performed at [facility] [address] [place], [place]  [address]    Special Requests   Final    NONE Performed at [facility], [place] [address] [place], [place] [address]    Gram Stain   Final    RARE WBC PRESENT, PREDOMINANTLY MONONUCLEAR NO ORGANISMS SEEN Performed at [facility], [place] [address] [place], [place] [address]    Culture PENDING  Incomplete   Report Status PENDING  Incomplete  Aerobic/Anaerobic Culture (surgical/deep wound)     Status: None (Preliminary result)   Collection Time: 04/23/20  4:17 PM   Specimen: Peritoneal Washings; Peritoneal Fluid  Result Value Ref Range Status   Specimen Description   Final    PERITONEAL  2 Performed at [facility] [address], [place], [place] [address]    Special Requests   Final    NONE Performed at [facility], [place] [address] [place], [place] [address]    Gram Stain   Final    FEW WBC PRESENT,BOTH PMN AND MONONUCLEAR FEW GRAM POSITIVE COCCI IN PAIRS IN CLUSTERS Performed at [facility], [place] [address], [place], [place] [address]    Culture PENDING  Incomplete   Report Status PENDING  Incomplete    Studies/Results: DG Abd 1 View  Result Date: 04/22/2020 CLINICAL DATA:  Enteric catheter placement EXAM: ABDOMEN - 1 VIEW COMPARISON:  04/20/2020 FINDINGS: Supine frontal view of the abdomen and pelvis is obtained, excluding the lower pelvis by collimation. Tip and side port of an enteric catheter project over the gastric antrum. There is a paucity of bowel gas. No masses or abnormal calcifications. IMPRESSION: 1. Enteric catheter tip and side port projecting over the gastric antrum. Electronically Signed   By: Randa Ngo M.D.   On: 04/22/2020 15:52   CT ASPIRATION  Result Date: 04/23/2020 CLINICAL DATA:  Sepsis. Multiple peritoneal and retroperitoneal fluid collections on recent CT. Aspiration possible drainage requested. EXAM: CT GUIDED ASPIRATION BIOPSY OF RETROPERITONEAL COLLECTION CT GUIDED ASPIRATION  BIOPSY OF INTRAPERITONEAL COLLECTION ANESTHESIA/SEDATION: none required PROCEDURE: The procedure risks, benefits, and alternatives were explained to the patient. Questions regarding the procedure were encouraged and answered. The patient understands and consents to the procedure. Select axial scans through the abdomen were obtained. The dominant left retroperitoneal collection and left lower quadrant intraperitoneal collection were localized and appropriate skin entry site determined and marked. The operative field was prepped with chlorhexidinein a sterile fashion, and a sterile drape was applied covering the operative field. A sterile gown and sterile gloves were used for the  procedure. Local anesthesia was provided with 1% Lidocaine. Under CT fluoroscopic guidance, 18 gauge trocar needle advanced into the left retroperitoneal collection. Approximately 10 mL of brown opaque non-malodorous fluid were aspirated, sent for Gram stain, culture, and lipase. Under CT fluoroscopic guidance, a new sterile 18 gauge trocar needle advanced into the left lower quadrant intraperitoneal loculated collection. Approximately 20 mL of brown opaque non-malodorous fluid were aspirated, sent for Gram stain, culture, and lipase. Postprocedure scans show decompression of both collections. No hemorrhage or other apparent complication. The patient tolerated the procedure well. COMPLICATIONS: None immediate FINDINGS: Left retroperitoneal and left lower quadrant intraperitoneal loculated collections were localized, corresponding to findings on prior CT. Both were separately aspirated using sterile equipment, and the aspirate sent for Gram stain, culture, and lipase. IMPRESSION: Technically successful CT-guided aspiration of left retroperitoneal and left lower quadrant intraperitoneal loculated collections, sent for Gram stain, culture, and lipase. Clinical impression is probable pseudocysts presumably related to pancreatitis. Electronically  Signed   By: Lucrezia Europe M.D.   On: 04/23/2020 17:37   CT ASPIRATION  Result Date: 04/23/2020 CLINICAL DATA:  Sepsis. Multiple peritoneal and retroperitoneal fluid collections on recent CT. Aspiration possible drainage requested. EXAM: CT GUIDED ASPIRATION BIOPSY OF RETROPERITONEAL COLLECTION CT GUIDED ASPIRATION BIOPSY OF INTRAPERITONEAL COLLECTION ANESTHESIA/SEDATION: none required PROCEDURE: The procedure risks, benefits, and alternatives were explained to the patient. Questions regarding the procedure were encouraged and answered. The patient understands and consents to the procedure. Select axial scans through the abdomen were obtained. The dominant left retroperitoneal collection and left lower quadrant intraperitoneal collection were localized and appropriate skin entry site determined and marked. The operative field was prepped with chlorhexidinein a sterile fashion, and a sterile drape was applied covering the operative field. A sterile gown and sterile gloves were used for the procedure. Local anesthesia was provided with 1% Lidocaine. Under CT fluoroscopic guidance, 18 gauge trocar needle advanced into the left retroperitoneal collection. Approximately 10 mL of brown opaque non-malodorous fluid were aspirated, sent for Gram stain, culture, and lipase. Under CT fluoroscopic guidance, a new sterile 18 gauge trocar needle advanced into the left lower quadrant intraperitoneal loculated collection. Approximately 20 mL of brown opaque non-malodorous fluid were aspirated, sent for Gram stain, culture, and lipase. Postprocedure scans show decompression of both collections. No hemorrhage or other apparent complication. The patient tolerated the procedure well. COMPLICATIONS: None immediate FINDINGS: Left retroperitoneal and left lower quadrant intraperitoneal loculated collections were localized, corresponding to findings on prior CT. Both were separately aspirated using sterile equipment, and the aspirate sent  for Gram stain, culture, and lipase. IMPRESSION: Technically successful CT-guided aspiration of left retroperitoneal and left lower quadrant intraperitoneal loculated collections, sent for Gram stain, culture, and lipase. Clinical impression is probable pseudocysts presumably related to pancreatitis. Electronically Signed   By: Lucrezia Europe M.D.   On: 04/23/2020 17:37   DG Chest Port 1 View  Result Date: 05-20-20 CLINICAL DATA:  Respiratory failure. EXAM: PORTABLE CHEST 1 VIEW COMPARISON:  04/22/2020 FINDINGS: 0434 hours. Endotracheal tube tip is 2.9 cm above the base of the carina. The NG tube passes into the stomach although the distal tip position is not included on the film. Left PICC line tip overlies the proximal SVC. Lower face obscures the right lung apex. Diffuse bilateral mid and lower lung airspace disease appears progressive in the interval. Probable layering bilateral pleural effusions. Cardiopericardial silhouette is at upper limits of normal for size. Bones are diffusely demineralized. Telemetry leads overlie the chest. IMPRESSION: Low volume  rotated film with progressing bibasilar collapse/consolidation and probable layering bilateral effusions. Electronically Signed   By: Misty Stanley M.D.   On: 05/15/20 07:26   Portable Chest x-ray  Result Date: 04/22/2020 CLINICAL DATA:  Intubated EXAM: PORTABLE CHEST 1 VIEW COMPARISON:  04/20/2020 FINDINGS: Single frontal view of the chest demonstrates endotracheal tube overlying tracheal air column, tip 3.5 cm above carina. Left subclavian central venous catheter tip overlies superior vena cava. Cardiac silhouette is unremarkable. Right apical mass again identified concerning for neoplasm. Bibasilar consolidation and small left pleural effusion again noted, unchanged. No pneumothorax. No acute bony abnormality. IMPRESSION: 1. Support devices as above. 2. Persistent bibasilar consolidation and left effusion. 3. Stable right apical mass suspicious for  neoplasm. Electronically Signed   By: Randa Ngo M.D.   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  She has multiple areas of infection.  Her abd aspirate is GPC Overall, her course continues to be difficult.  Appreciate goals of care meeting.  K+ corrected by primary team.  Available as needed.          Bobby Rumpf MD, FACP Infectious Diseases (pager) [phone] [email or web address] 05-15-20, 9:09 AM  LOS: 34 days

## 2020-05-12 NOTE — Progress Notes (Signed)
NAME:  Deborah Figueroa, MRN:  [id number], DOB:  1956/01/14, LOS: 47 ADMISSION DATE:  03/20/2020, CONSULTATION DATE: 2/7 REFERRING MD:  Dr. Sarajane Jews, CHIEF COMPLAINT:  Hypotension   Brief History:  65 y/o F, homeless, admitted 1/8 with reports of weakness, falls, increased urination and thirst.  She was found to have DKA on presentation and was started on insulin / IVF and admitted per [facility].  She had a leukocytosis on presentation and left elbow pain which led to identification of sepsis secondary to MSSA bacteremia.  Imaging identified left olecranon bursitis and osteomyelitis.  She underwent I&D on 1/10 with fluid / tissue from the L olecranon grew MSSA. Repeat cultures subsequently cleared.  Plan is to complete 6 weeks of IV antibiotics (likely inpatient due to homelessness / lack of insurance). She developed a normocytic normochromic anemia with a positive FOBT, epistaxis and GI was consulted as well as ONC.  She had an EGD + colonoscopy on 1/27 which showed stercoral ulcer and gastric ulcer.  ONC felt her anemia was in the setting of sepsis.  She was noted to have coagulopathy and treated with vitamin K.  She developed hypoxemic respiratory distress in the setting of PNA.  She has had extensive radiographic evaluation which has raised concern for RUL pulmonary mass, enlarging left pleural effusion, thyroid nodule, & possible pathologic fractures of spine.  She developed worsening AKI, AGMA & hypotension prompting PCCM consultation on 2/7.  Past Medical History:  Homelessness  Seasonal Allergies  Depression  Cellulitis  HTN HLD DM  Significant Hospital Events:  1/08 Admit  1/10 ID of left arm per Ortho  1/27 EGD + Colonoscopy  2/07 PCCM consulted  2/08 Hypotension resolved with bicarbonate administration, off pressors. FFP for INR >10 2/09 Hgb 6.3, PRBC. AKI. Electrolyte disturbances. Intubated for resp distress  Consults:  ONC  GI  Ortho  ID NSGY PCCM Nephrology  Procedures:   OETT 2/9 >> Lt PICC 2/9>>   Significant Diagnostic Tests:   CT Head 1/6 >> negative for acute intracranial abnormality, forehead periorbital hematoma  Renal US 1/8 >> negative   CT L Humerus / Forearm 1/10 >> osteomyelitis of the olecranon with adjacent inflammatory phlegmon and abscess in the region of the olecranon bursa, evidence of pyomyositis in the flexor digitorum superficialis muscle of the proximal forearm, small abscess along the ventral aspect of the lower bisceps  MRI Brain 1/9 >> no evidence of recent infarction   MRI L Elbow 1/9 >> limited study, osteomyelitis of the olecranon and proximal ulna, moderate joint effusion concerning for septic arthritis, proximal forearm flexor compartment intramuscular fluid collection concerning for abscess, additional incompletely visualized small fluid collections in the distal upper arm   ECHO 1/11 >> LVEF 65-70%, LV with hyperdynamic function, mild LVH, RV systolic function normal,   TEE 1/11 >> no evidence of vegetation / infective endocarditis   CT Chest 1/17 >> right apical lung mass 2.7 x 2.3 x2 cm concerning for neoplasm potentially with chest wall invasion, bilateral lower lobe infiltrates c/w atelectasis and pneumonia.  Ill defined peripancreatic tissue planes raising question of pancreatitis at the tail, left thyroid nodule 2.2 x 1.9 cm.    [place] ABD Limited 1/23 >> cholelithiasis with contracted gallbladder around at least a single gallstone, normal sonographic appearance of the liver  CT Chest w/o 2/1 >> limited evaluation with non-contrasted study, persistent bibasilar airspace disease with increasing left effusion, possible right apical lung mass with mottled appearance of C7, T1 vertebral bodies with  a possible pathologic fracture through the T1 transverse process on the right. Abnormal appearance of T7, T8 vertebral bodies.  Very abnormal appearance of the partially visualized pancreas with questionable pancreatic tail mass, new  fluid abutting the proximal stomach, non-specific left flank edema, 2.7cm left sided thyroid nodule, non-specific bilateral glenohumeral joint effusions.  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  CT ABD / Pelvis 2/7 >> acute pancreatitis, multiple pancreatic pseudocysts, mass-like prominence of the tail of the pancreas, multiple nodular densities extending inferiorly from the tail of the pancreas although metastasis is not excluded, small bilateral pleural effusions L>R with bibasilar atelectasis, no bowel obstruction, normal appendix  Micro Data:  COVID 1/8 >> negative  HIV 1/8 >> negative  BCID 1/8 >> staph detected  MRSA PCR 1/10 >> positive  BCx2 1/8 >> MSSA L Olecranon 1/10 >> MSSA  UC 1/17 >> greater than 100k yeast  BCx2 1/16 >> negative  MRSA PCR 2/7 >> negative   Antimicrobials:  Cefazolin 1/9 >> 1/23  Nafcillin 1/9 >> 1/10  Cefepime 1/18 >>  Diflucan 2/4 >> 2/7 Flagyl 2/7 >>  Interim History / Subjective:  Significant issues with vent dyssynchrony driven by metabolic acidosis and hypoxia resulting in vent titration and resumption of sodium bicarb infusion  Objective   Blood pressure (!) 100/44, pulse 84, temperature 99.4 F (37.4 C), temperature source Axillary, resp. rate 14, height 5\' 11"  (1.803 m), weight 101.5 kg, SpO2 93 %.    Vent Mode: PRVC FiO2 (%):  [30 %-40 %] 40 % Set Rate:  [24 bmp-35 bmp] 28 bmp Vt Set:  [560 mL-620 mL] 560 mL PEEP:  [5 cmH20] 5 cmH20 Plateau Pressure:  [13 cmH20-24 cmH20] 22 cmH20   Intake/Output Summary (Last 24 hours) at April 30, 2020 1024 Last data filed at Apr 30, 2020 1610 Gross per 24 hour  Intake 2343.08 ml  Output 1900 ml  Net 443.08 ml   Filed Weights   04/02/2020 0952 03/27/2020 1400 04/20/20 1100  Weight: 93.9 kg 93.9 kg 101.5 kg    Examination: General: Chronically ill  appearing elderly female lying in bed on mechanical vent in NAD  HEENT: ETT, MM pink/moist, PERRL,  Neuro: Sedated on vent will attempt to open eyes to verbal stimuli CV: s1s2 regular rate and rhythm, no murmur, rubs, or gallops,  PULM:  Bilateral rhonchi, continues with vent dyssynchrony this am requiring multiple sedating pushes  GI: soft, bowel sounds active in all 4 quadrants, non-tender, non-distended Extremities: warm/dry, no edema  Skin: no rashes or lesions   Resolved Hospital Problem list   Hypercalcemia  Hypokalemia   Assessment & Plan:   Severe Sepsis in setting of left Olecranon Osteomyelitis with subsequent MSSA Bacteremia  -Concern for intra-abdominal infection with fluid collection -Component of relative adrenal insufficiency (despite prior assessment).  P: Continue pressor support no requiring both levo  Continue stress dose steroids  Antibiotics per ID, changed to IV Ancef only 2/11 Follow peritoneal fluid culture obtained by IR 2/11  Acute Hypoxemic Respiratory Failure  PNA  -Bibasilar infiltrates / consolidation with air bronchograms on imaging.  Right Apical Lung Mass -Never -smoker, but second hand smoke as a child -DDx includes infectious / inflammatory etiology vs malignancy given other imaging findings.  P: Continue ventilator support with lung protective strategies  Wean PEEP and FiO2 for sats greater than 90%. Head of  bed elevated 30 degrees. Plateau pressures less than 30 cm H20.  Follow intermittent chest x-ray and ABG.   SAT/SBT as tolerated, mentation preclude extubation  Ensure adequate pulmonary hygiene  Follow cultures  VAP bundle in place  PAD protocol ABT's as above   AKI  AG / NG Metabolic Acidosis P: Attempted to discontinue Bicarb infusion 2/10 but patients metabolic acidosis worsened  Patient is no a good candidate for dialysis  Follow renal function / urine output Trend Bmet Avoid nephrotoxins Ensure adequate renal perfusion    Normocytic Normochromic Anemia.   -No clear bleeding source identified -Nonbleeding ulcer identified on EGD P: Trend CBC Transfuse per protocol  HGB goal > 7  Coagulopathy -No evidence of DIC -INR 9.6 2/7, not on anticoagulation, Rx with 3 units FFP + vitamin K P: INR corrected with above interventions  Appreciate Hematology assistance  SCD  Gastric Ulcer  -Biopsy negative  P: Continue PPI  Watch for signs of GI bleed    Poorly Controlled DM  -Hgb A1c 10.4 on admit.  P: Continue SSI CBG q4hrs  Pancreatitis per CT 2/7, with possible Pseudocyts Left retroperitoneal and left lower quadrant intraperitoneal loculated collections -Underwent IR aspiration of abcesses 2/10 P: GI and ID following Lipase with flat trend  Concern for presence of MSSA in peritoneal fluid   Questionable C7, T1 Vertebral Body Pathologic Fracture ? Infectious vs malignant etiology  P: Follow up imaging   Thyroid Nodule  -2.7 cm left thyroid nodule P: If she survived this hospitalization she will need outpatient workup  Hx HTN, HLD P: Home antihypertensives on hold   Questionable Rectovaginal Fistula P: Pending further evaluation    Best practice (evaluated daily)  Diet: clear liquid diet  Pain/Anxiety/Delirium protocol (if indicated): n/a  VAP protocol (if indicated): n/a  DVT prophylaxis: SCD's GI prophylaxis: PPI  Glucose control: SSI  Mobility: as tolerated  Disposition: SDU   Goals of Care:  Last date of multidisciplinary goals of care discussion: 2/10 Family and staff present: See Plan of care note 2/10 Summary of discussion: DNR Follow up goals of care discussion due: 2/14  Code Status: DNR  Critical care time:   Performed by: Johnsie Cancel  Total critical care time: 45 minutes  Critical care time was exclusive of separately billable procedures and treating other patients.  Critical care was necessary to treat or prevent imminent or life-threatening  deterioration.  Critical care was time spent personally by me on the following activities: development of treatment plan with patient and/or surrogate as well as nursing, discussions with consultants, evaluation of patient's response to treatment, examination of patient, obtaining history from patient or surrogate, ordering and performing treatments and interventions, ordering and review of laboratory studies, ordering and review of radiographic studies, pulse oximetry and re-evaluation of patient's condition.  Johnsie Cancel, NP-C [facility] Personal contact information can be found on Amion  If no response please page: Adult pulmonary and critical care medicine pager on Amion unitl 7pm After 7pm please call [phone] 04/29/2020, 11:00 AM

## 2020-05-12 NOTE — Progress Notes (Signed)
152ml of Fentanyl gtt wasted in sink with Marliss Czar as witness.

## 2020-05-12 NOTE — Progress Notes (Signed)
PHARMACY CONSULT NOTE FOR:  OUTPATIENT  PARENTERAL ANTIBIOTIC THERAPY (OPAT)  Indication: MSSA bacteremia w/ associated left elbow olecronon bursitis  Regimen: Cefazolin 2 gm IV q 8 hours  End date: 05/19/2020  Originally planned for 6 weeks, will extend 2 weeks to make 8  IV antibiotic discharge orders are pended. To discharging provider:  please sign these orders via discharge navigator,  Select New Orders & click on the button choice - Manage This Unsigned Work.     Thank you for allowing pharmacy to be a part of this patient's care.  Nicoletta Dress, PharmD, BCIDP Infectious Disease Pharmacist  Phone: [phone] 05-21-20, 10:15 AM

## 2020-05-12 NOTE — Progress Notes (Signed)
CRITICAL VALUE ALERT  Critical Value: K+ 2.5  Date & Time Notied:  2/11 @ 5198Y  Provider Notified: Warren Lacy RN  Orders Received/Actions taken: awaiting orders

## 2020-05-12 NOTE — Progress Notes (Signed)
eLink Physician-Brief Progress Note Patient Name: Deborah Figueroa DOB: 1955/06/15 MRN: [id number]   Date of Service  2020/05/03  HPI/Events of Note  K 2.5. Mg 1.7. Cr 2.95.  eICU Interventions  Ordered 40 mEq KCl IV and 40 mEq KCl per tube, as well as 2g MgSO4 IV. Recheck BMP at 1300 hrs.     Intervention Category Intermediate Interventions: Electrolyte abnormality - evaluation and management  Charlott Rakes 05-03-2020, 6:35 AM

## 2020-05-12 NOTE — Progress Notes (Signed)
Palliative Care Progress Note  Reason for visit: Goals of care in light of progressive respiratory failure, renal failure, and overwhelming sepsis  I saw and examined Ms. Bunn today.  Discussed with Merlene Laughter from [facility].  Her condition continues to decline.  She was more acidotic and hypoxic overnight, her creatinine is climbing, and she has red material draining from tube today.  I called and discussed with her son, [name].  We talked about the fact that her condition has worsened despite continuation of medical interventions.  I expressed to him my concern that she is approaching end-of-life regardless of interventions moving forward.  He seems accepting and understanding of this information.  We talked about consideration for extubation and transition to full comfort.  One thing that is important to family prior to this is to have family that have not seen her be able to come and visit to say their goodbyes.  Her brother is the furthest away and will be traveling from out of state.    Discussed with unit leadership and PCCM. There is agreement that it is appropriate to allow visitation per end-of-life policy as she is decompensating despite medical intervention, there is no plan for heroic interventions in the event of cardiac arrest, and we are working to move toward extubation for comfort.  -DNR in the event of cardiac arrest.  Continue with current care while awaiting other family from out of town to be able to come and visit with her. -I talked with her son today about her continued decline overnight.  Expressed concern that we are approaching end-of-life regardless of interventions.  Yesterday, family expressed that it would be very important to have her brother and some other family be able to see her before she dies.  Plan is to allow family to visit and then discuss timing of extubation once family has a chance to see her. -We discussed a plan for extubation to comfort.  We will  continue discussion about timing of extubation once family (particularly her brother traveling from out of town) has a chance to come and see her.  Start time: 1000 End time: 1040 Total time: 40 minutes  Greater than 50%  of this time was spent counseling and coordinating care related to the above assessment and plan.  Micheline Rough, MD [facility] [place] [facility] Team [phone]

## 2020-05-12 NOTE — Progress Notes (Signed)
Pt expired with family at bedside. MD. Domingo Cocking pronounced and family given time to be with pt at this time.

## 2020-05-12 NOTE — Discharge Summary (Signed)
NAME:  Deborah Figueroa, MRN:  [id number], DOB:  [birth year], LOS: 24 ADMISSION DATE:  03/17/2020, CONSULTATION DATE: 2/7 REFERRING MD:  Dr. Sarajane Jews, CHIEF COMPLAINT:  Hypotension   Brief History:  65 y/o F, homeless, admitted 1/8 with reports of weakness, falls, increased urination and thirst.  She was found to have DKA on presentation and was started on insulin / IVF and admitted per [facility].  She had a leukocytosis on presentation and left elbow pain which led to identification of sepsis secondary to MSSA bacteremia.  Imaging identified left olecranon bursitis and osteomyelitis.  She underwent I&D on 1/10 with fluid / tissue from the L olecranon grew MSSA. Repeat cultures subsequently cleared.  Plan is to complete 6 weeks of IV antibiotics (likely inpatient due to homelessness / lack of insurance). She developed a normocytic normochromic anemia with a positive FOBT, epistaxis and GI was consulted as well as ONC.  She had an EGD + colonoscopy on 1/27 which showed stercoral ulcer and gastric ulcer.  ONC felt her anemia was in the setting of sepsis.  She was noted to have coagulopathy and treated with vitamin K.  She developed hypoxemic respiratory distress in the setting of PNA.  She has had extensive radiographic evaluation which has raised concern for RUL pulmonary mass, enlarging left pleural effusion, thyroid nodule, & possible pathologic fractures of spine.  She developed worsening AKI, AGMA & hypotension prompting PCCM consultation on 2/7.  Past Medical History:  Homelessness  Seasonal Allergies  Depression  Cellulitis  HTN HLD DM  Significant Hospital Events:  1/08 Admit  1/10 ID of left arm per Ortho  1/27 EGD + Colonoscopy  2/07 PCCM consulted  2/08 Hypotension resolved with bicarbonate administration, off pressors. FFP for INR >10 2/09 Hgb 6.3, PRBC. AKI. Electrolyte disturbances. Intubated for resp distress  Consults:  ONC  GI  Ortho  ID NSGY PCCM Nephrology  Procedures:   OETT 2/9 >> Lt PICC 2/9>>   Significant Diagnostic Tests:   CT Head 1/6 >> negative for acute intracranial abnormality, forehead periorbital hematoma  Renal US 1/8 >> negative   CT L Humerus / Forearm 1/10 >> osteomyelitis of the olecranon with adjacent inflammatory phlegmon and abscess in the region of the olecranon bursa, evidence of pyomyositis in the flexor digitorum superficialis muscle of the proximal forearm, small abscess along the ventral aspect of the lower bisceps  MRI Brain 1/9 >> no evidence of recent infarction   MRI L Elbow 1/9 >> limited study, osteomyelitis of the olecranon and proximal ulna, moderate joint effusion concerning for septic arthritis, proximal forearm flexor compartment intramuscular fluid collection concerning for abscess, additional incompletely visualized small fluid collections in the distal upper arm   ECHO 1/11 >> LVEF 65-70%, LV with hyperdynamic function, mild LVH, RV systolic function normal,   TEE 1/11 >> no evidence of vegetation / infective endocarditis   CT Chest 1/17 >> right apical lung mass 2.7 x 2.3 x2 cm concerning for neoplasm potentially with chest wall invasion, bilateral lower lobe infiltrates c/w atelectasis and pneumonia.  Ill defined peripancreatic tissue planes raising question of pancreatitis at the tail, left thyroid nodule 2.2 x 1.9 cm.    [place] ABD Limited 1/23 >> cholelithiasis with contracted gallbladder around at least a single gallstone, normal sonographic appearance of the liver  CT Chest w/o 2/1 >> limited evaluation with non-contrasted study, persistent bibasilar airspace disease with increasing left effusion, possible right apical lung mass with mottled appearance of C7, T1 vertebral bodies with  a possible pathologic fracture through the T1 transverse process on the right. Abnormal appearance of T7, T8 vertebral bodies.  Very abnormal appearance of the partially visualized pancreas with questionable pancreatic tail mass, new  fluid abutting the proximal stomach, non-specific left flank edema, 2.7cm left sided thyroid nodule, non-specific bilateral glenohumeral joint effusions.  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  CT ABD / Pelvis 2/7 >> acute pancreatitis, multiple pancreatic pseudocysts, mass-like prominence of the tail of the pancreas, multiple nodular densities extending inferiorly from the tail of the pancreas although metastasis is not excluded, small bilateral pleural effusions L>R with bibasilar atelectasis, no bowel obstruction, normal appendix  Micro Data:  COVID 1/8 >> negative  HIV 1/8 >> negative  BCID 1/8 >> staph detected  MRSA PCR 1/10 >> positive  BCx2 1/8 >> MSSA L Olecranon 1/10 >> MSSA  UC 1/17 >> greater than 100k yeast  BCx2 1/16 >> negative  MRSA PCR 2/7 >> negative   Antimicrobials:  Cefazolin 1/9 >> 1/23  Nafcillin 1/9 >> 1/10  Cefepime 1/18 >>  Diflucan 2/4 >> 2/7 Flagyl 2/7 >>   Resolved Hospital Problem list   Hypercalcemia  Hypokalemia   Assessment & Plan:   Severe Sepsis in setting of left Olecranon Osteomyelitis with subsequent MSSA Bacteremia  -Concern for intra-abdominal infection with fluid collection -Component of relative adrenal insufficiency (despite prior assessment).  P: Continue pressor support no requiring both levo  Continue stress dose steroids  Antibiotics per ID, changed to IV Ancef only 2/11 Follow peritoneal fluid culture obtained by IR 2/11  Acute Hypoxemic Respiratory Failure  PNA  -Bibasilar infiltrates / consolidation with air bronchograms on imaging.  Right Apical Lung Mass -Never -smoker, but second hand smoke as a child -DDx includes infectious / inflammatory etiology vs malignancy given other imaging findings.  P:  Tolerating pressure support  AKI  AG / NG Metabolic  Acidosis P: Attempted to discontinue Bicarb infusion 2/10 but patients metabolic acidosis worsened  Patient is no a good candidate for dialysis  Follow renal function / urine output Trend Bmet Avoid nephrotoxins Ensure adequate renal perfusion   Normocytic Normochromic Anemia.   -No clear bleeding source identified -Nonbleeding ulcer identified on EGD P: Trend CBC Transfuse per protocol  HGB goal > 7  Coagulopathy -No evidence of DIC -INR 9.6 2/7, not on anticoagulation, Rx with 3 units FFP + vitamin K P: INR corrected with above interventions  Appreciate Hematology assistance  SCD  Gastric Ulcer  -Biopsy negative  P: Continue PPI  Watch for signs of GI bleed    Poorly Controlled DM  -Hgb A1c 10.4 on admit.  P: Continue SSI CBG q4hrs  Pancreatitis per CT 2/7, with possible Pseudocyts Left retroperitoneal and left lower quadrant intraperitoneal loculated collections -Underwent IR aspiration of abcesses 2/10 P: GI and ID following Lipase with flat trend  Concern for presence of MSSA in peritoneal fluid   Questionable C7, T1 Vertebral Body Pathologic Fracture ? Infectious vs malignant etiology  P: Follow up imaging   Thyroid Nodule  -2.7 cm left thyroid nodule P: If she survived this hospitalization she will need outpatient workup  Hx HTN, HLD P: Home antihypertensives on hold   Questionable Rectovaginal Fistula P: Pending further evaluation   Goals of care outlined on 2/10, palliative care following. Unfortunately her condition deteriorated 2/11 and  she was transitioned to full comfort care and extubated.  She passed away soon after.  Cause of death: MSSA bacteremia, septic shock, osteomyelitis, AKI, acute respiratory failure Comorbidities-hypertension, diabetes  Gaberial Cada V. Elsworth Soho MD  04/27/2020, 3:40 PM

## 2020-05-12 NOTE — Procedures (Signed)
Extubation Procedure Note  Patient Details:   Name: Deborah Figueroa DOB: 06/08/1955 MRN: [id number]   Airway Documentation:    Vent end date: 05-16-20 Vent end time: 1950   Evaluation  O2 sats: transiently fell during during procedure Complications: No apparent complications Patient did tolerate procedure well. Bilateral Breath Sounds: Coarse crackles,Diminished   No  Rosann Auerbach 16-May-2020, 7:53 PM  Pt extubated per family wishes/MD order

## 2020-05-12 NOTE — Progress Notes (Signed)
Chaplain responded to page to offer assistance to family who had just made decision to move their mother to comfort care.  Chaplain met three daughters and pt's two brothers in family waiting area outside the ICU.  The family was grieving and finding it hard to speak, the youngest child especially was withdrawn into herself.  Chaplain offered ministry of care and concern.  Family was waiting on three additional family members to arrive prior to moving forward with comfort care.  Chaplain conferred with eldest daughter, offering to stay with the family or have the nurse call if needed.  Daughter elected for Chaplain to depart at this time.  Chaplain stands ready to return to assist this family as needed.  [place]

## 2020-05-12 NOTE — Progress Notes (Signed)
I received call that Ms. Authier was acutely decompensating with tachycardia and worsening hypotension.  I called and spoke with son and asked family to come to see her.  I met with family, including her children, her brothers, their spouses, and close friends.  We discussed her clinical course throughout the day and reviewed events of this hospitalization.  Discussed care options including increasing continuation of current interventions without escalation, refocusing care on comfort, or escalating care.    Her family believe that her desire, as she is approaching end of life, would be to transition to comfort and be liberated from life support while her family is present with her.  We discussed plan for withdrawal of mechanical ventilation and focusing on comfort with aggressive symptom management.    We discussed that based upon her quick decline today, I would anticipate her prognosis to be hours at best.  I was present at the bedside for terminal extubation.  Following extubation, symptoms were well managed with no signs of distress.    Respirations became spaced and ceased.  Exam: Pupils fixed and dilated No palpable central or peripheral pulses No corneal reflex No auscultated heart or lung sounds for greater than one minute  Time of death: 2018  Gene Freeman, MD [place] [facility] Team [phone]      

## 2020-05-12 NOTE — TOC Progression Note (Signed)
Transition of Care [facility]) - Progression Note    Patient Details  Name: Deborah Figueroa MRN: [id number] Date of Birth: Nov 28, 1955  Transition of Care [facility]) CM/SW Contact  Deborah Cha, RN Phone Number: 2020-05-06, 8:18 AM  Clinical Narrative:    Durable Power of Attorney or acting medical decision maker: Son, daughters  Discussion: We discussed goals of care for [name] .   Met with extended family including son, 2 daughters, son-in-law and daughter-in-law.  Dr. Domingo Cocking from palliative care participated in discussion Discussed clinical course, poor prognosis for meaningful recovery  We have elected to make her DNR.  Continue current level of care with antibiotics, pressors if needed.  Follow results from IR procedure.  Consider one-way extubation when we feel we have optimized her status.  If she were to deteriorate after extubation and family may consider comfort measures. PLAN per the above  Expected Discharge Plan: [place] Barriers to Discharge: Continued Medical Work up  Expected Discharge Plan and Services Expected Discharge Plan: [place]   Discharge Planning Services: CM Consult                                           Social Determinants of Health (SDOH) Interventions    Readmission Risk Interventions No flowsheet data found.

## 2020-05-12 NOTE — Progress Notes (Signed)
Pharmacy Antibiotic Note  Deborah Figueroa is a 65 y.o. 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  She has been on Cefazolin, was switched briefly to cefepime when she acutely decompensated. She is now s/p IR aspiration of IA fluid collections growin GPC likely MSSA but will follow for final cultures  Plan: Stop cefepime Stop flagyl Start cefazolin 2g IV q12h Follow up renal function, culture results, and clinical course.   Height: 5\' 11"  (180.3 cm) Weight: 101.5 kg (223 lb 12.3 oz) IBW/kg (Calculated) : 70.8  Temp (24hrs), Avg:99.2 F (37.3 C), Min:98.8 F (37.1 C), Max:99.5 F (37.5 C)  Recent Labs  Lab 04/20/20 1020 04/20/20 1313 04/21/20 0241 04/22/20 0504 04/22/20 0505 04/22/20 1200 04/22/20 1924 04/23/20 0249 05/02/20 0453  WBC  --   --  17.5* 8.9  --  19.4*  --  7.8 8.4  CREATININE  --  2.91* 2.86* 2.73*  --  3.02* 2.85* 2.66* 2.95*  LATICACIDVEN 2.0* 1.7 1.4  --  1.7  --   --   --   --     Estimated Creatinine Clearance: 25.3 mL/min (A) (by C-G formula based on SCr of 2.95 mg/dL (H)).    No Known Allergies  Antimicrobials this admission: 1/9 Nafcillin >> 1/10 1/10 Cefazolin>> 1/17, 1/23 >> 2/7 (original stop date 2/22) 1/17 Zosyn >> 1/18 1/18 Cefepime>> 1/23, resume 2/7 >>2/11 1/27 fluconazole (thrush) >> 2/7 2/7 Metronidazole >> 2/11 2/11 cefazolin >>  Dose adjustments this admission: 2/10 Cefepime dose adjusted for renal function.  Microbiology results: 1/8 BCx: MSSA 1/9: BCx: 2/2 show GPC (No BCID - assumed to be same as previous culture per lab)  1/10 fluid left olecranaon: MSSA 1/11 BCx: NGF 1/16 BCx: NGF 1/17 UCx: 100k col yeast - final 2/7 BCx: ngtd 2/7 UCx: insignificant growth 2/7 MRSA PCR: negative 2/10:  IA cultures GPC  Thank you for allowing pharmacy to be a part of this patient's care.  Nicoletta Dress, PharmD, BCIDP Infectious Disease Pharmacist  Phone: [phone] 05-02-2020 9:39 AM

## 2020-05-12 DEATH — deceased

## 2020-06-18 ENCOUNTER — Ambulatory Visit: Payer: Self-pay | Admitting: Family Medicine

## 2022-03-18 IMAGING — DX DG ELBOW COMPLETE 3+V*L*
4 series · 4 of 4 positions shown · non-contrast
Comparison: None.

CLINICAL DATA: History of multiple falls with left elbow pain,
initial encounter

EXAM:
LEFT ELBOW - COMPLETE 3+ VIEW

[elbow ap]
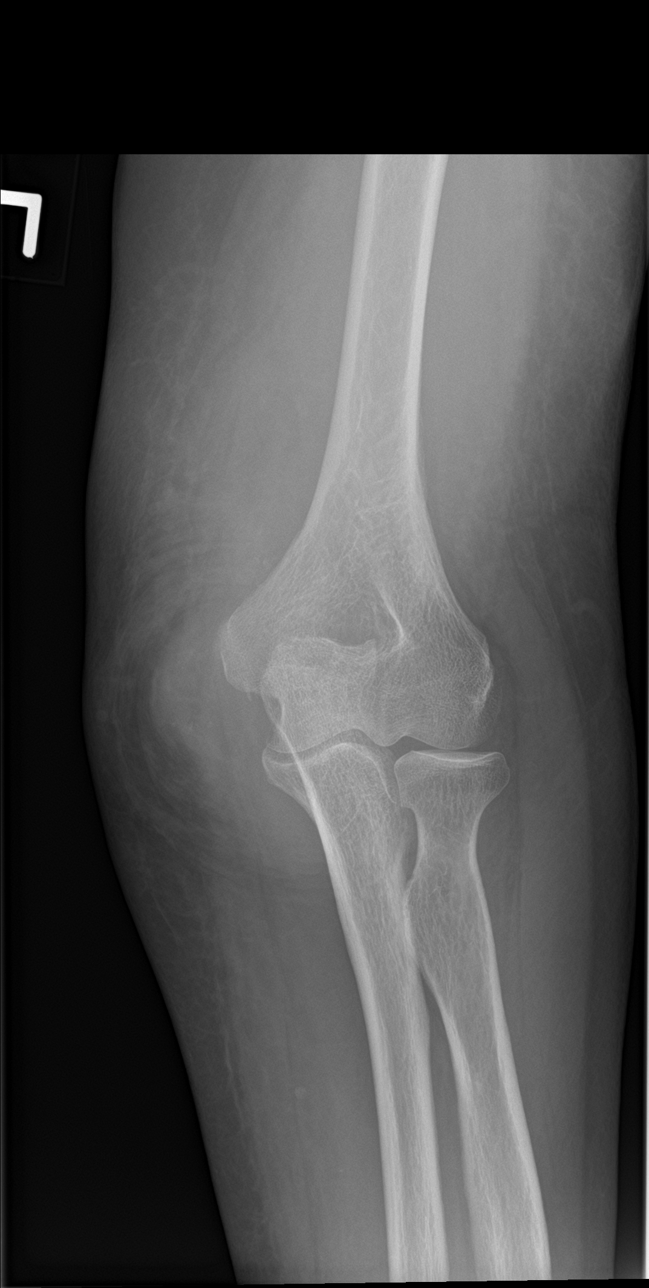

[elbow obl (1 of 2)]
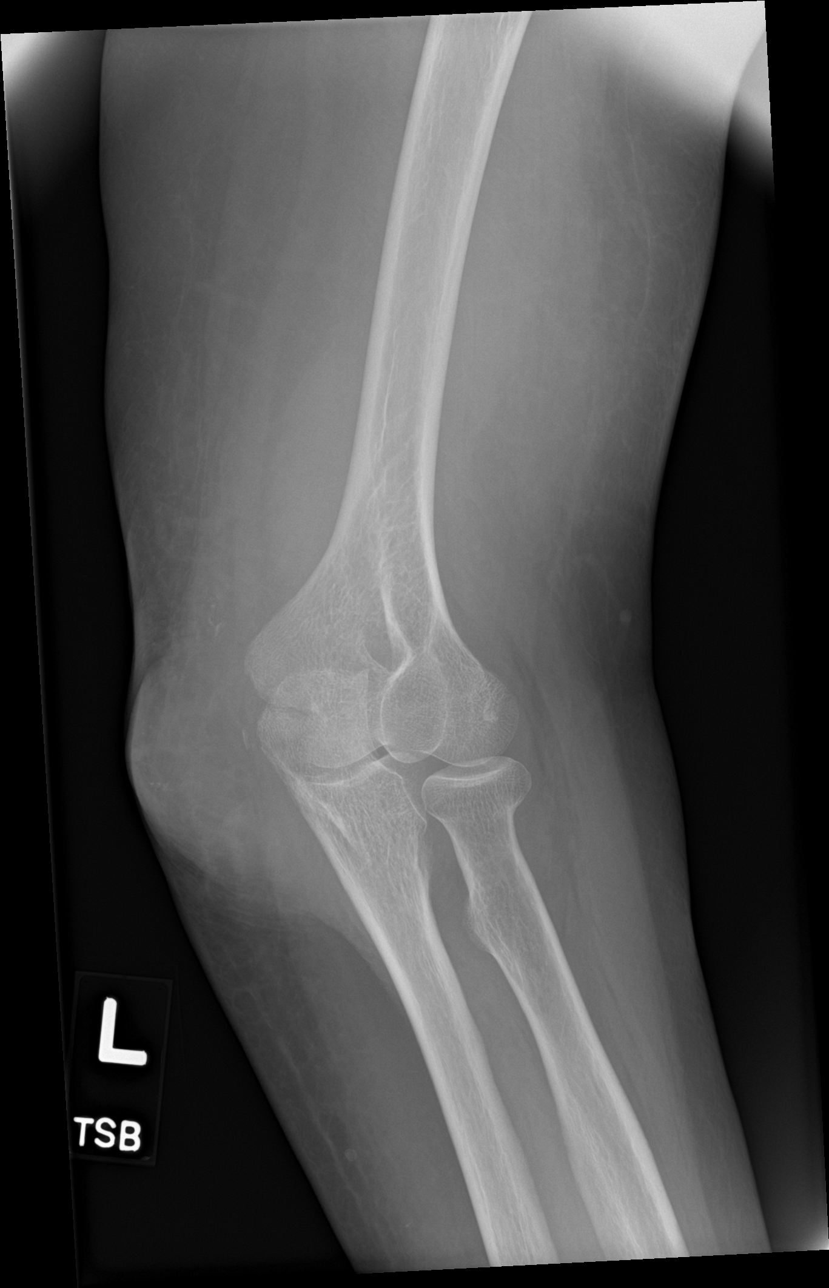

[elbow obl (2 of 2)]
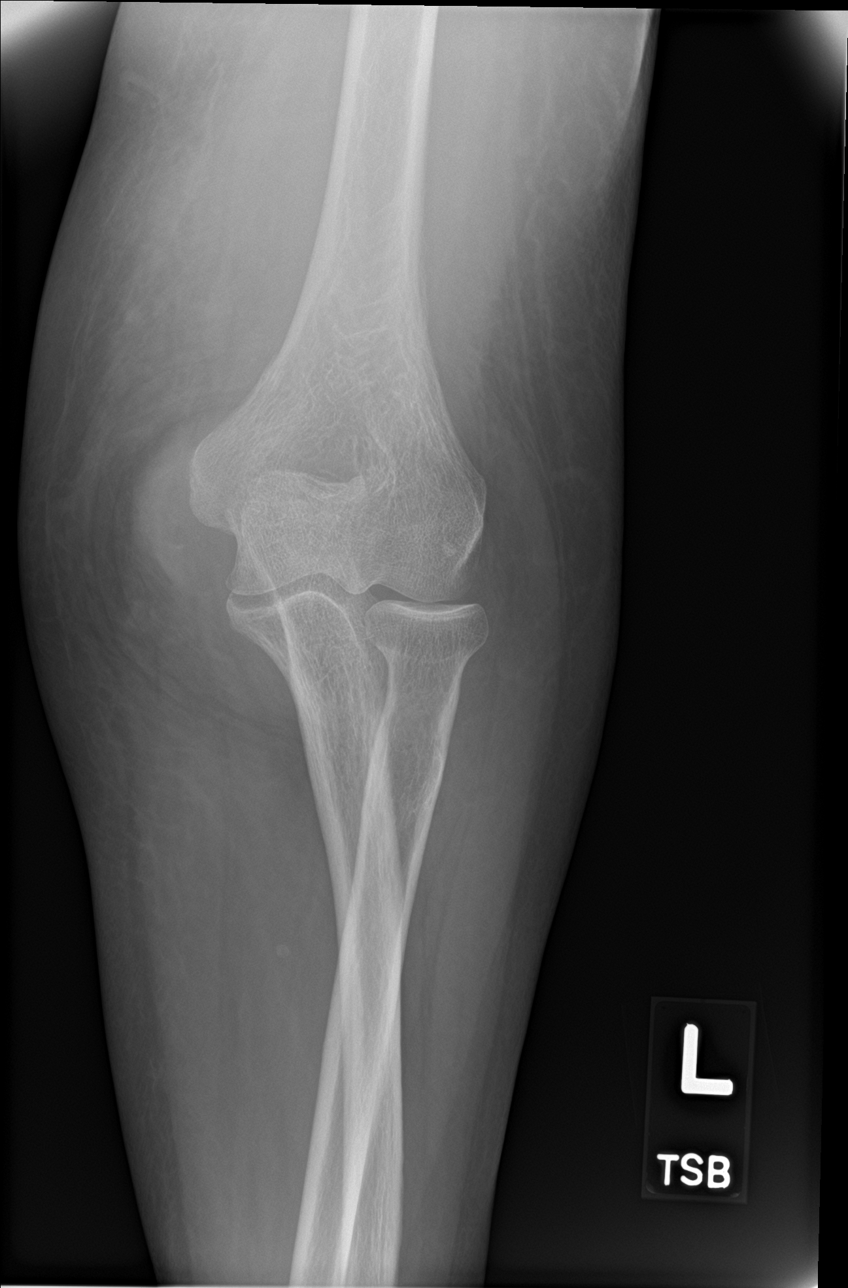

[elbow lat]
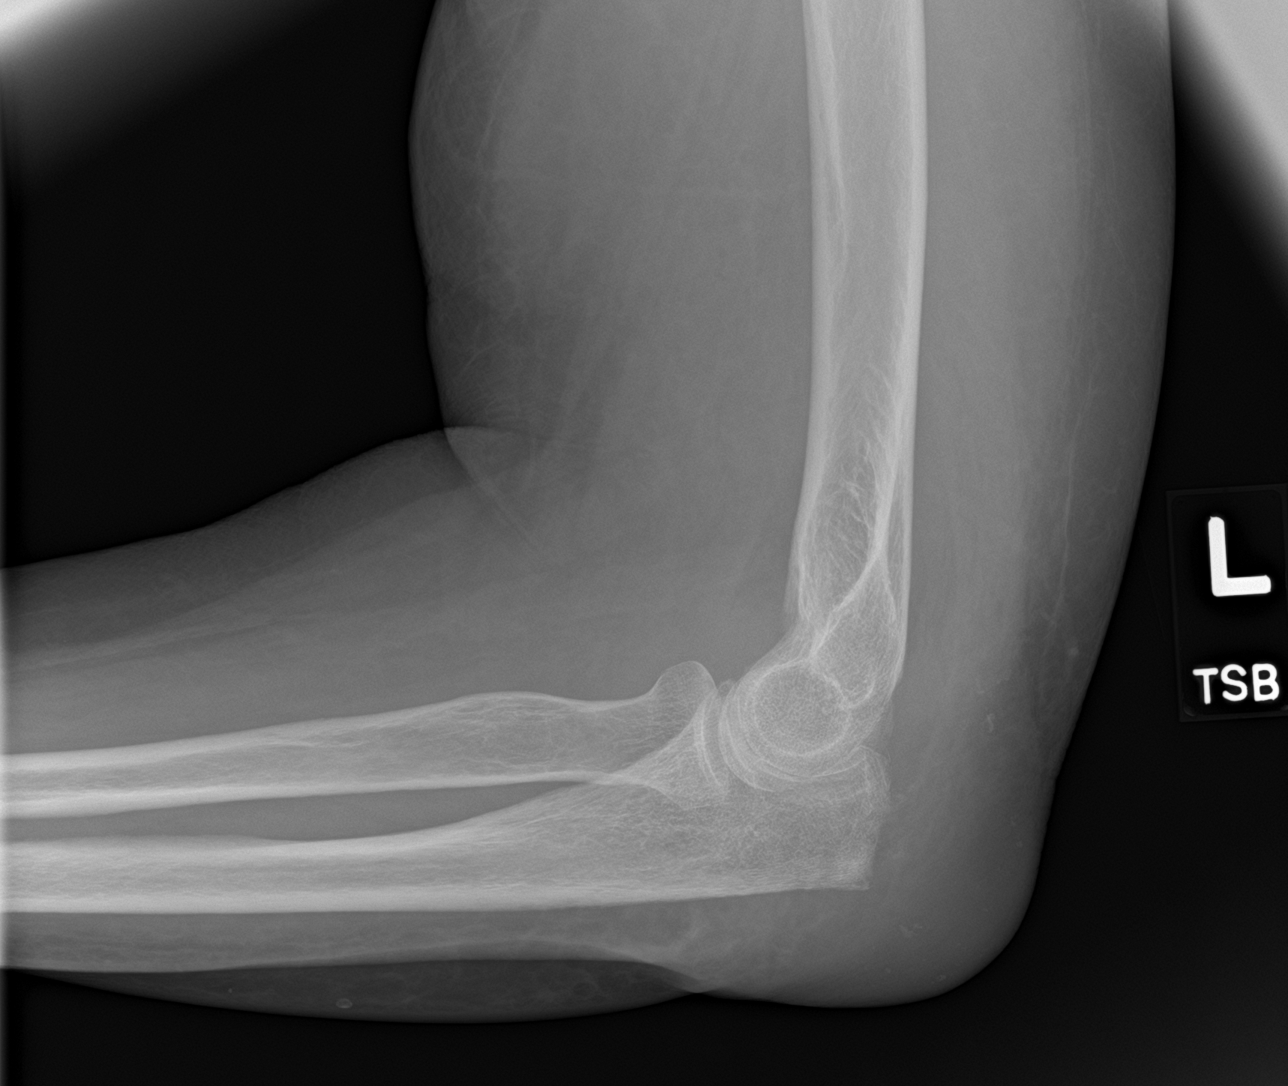

[4 of 4 positions shown; findings below may reference images not displayed]

FINDINGS: No acute fracture or dislocation is noted. Considerable soft tissue
swelling is noted over the olecranon with scattered soft tissue
calcifications likely related to olecranon bursitis. The posterior
aspect of the olecranon appears somewhat eroded. Possibility of
osteomyelitis would deserve consideration
IMPRESSION: Changes most consistent with olecranon bursitis. Some irregularity
of the olecranon on the lateral film is noted. Possibility of
underlying osteomyelitis deserves consideration. MRI may be helpful
as clinically indicated.

## 2022-03-29 IMAGING — DX DG CHEST 1V PORT
1 series · 1 of 1 positions shown · non-contrast
Comparison: 03/21/2020

CLINICAL DATA: Respiratory failure and hemoptysis.

EXAM:
PORTABLE CHEST 1 VIEW

[chest ap]
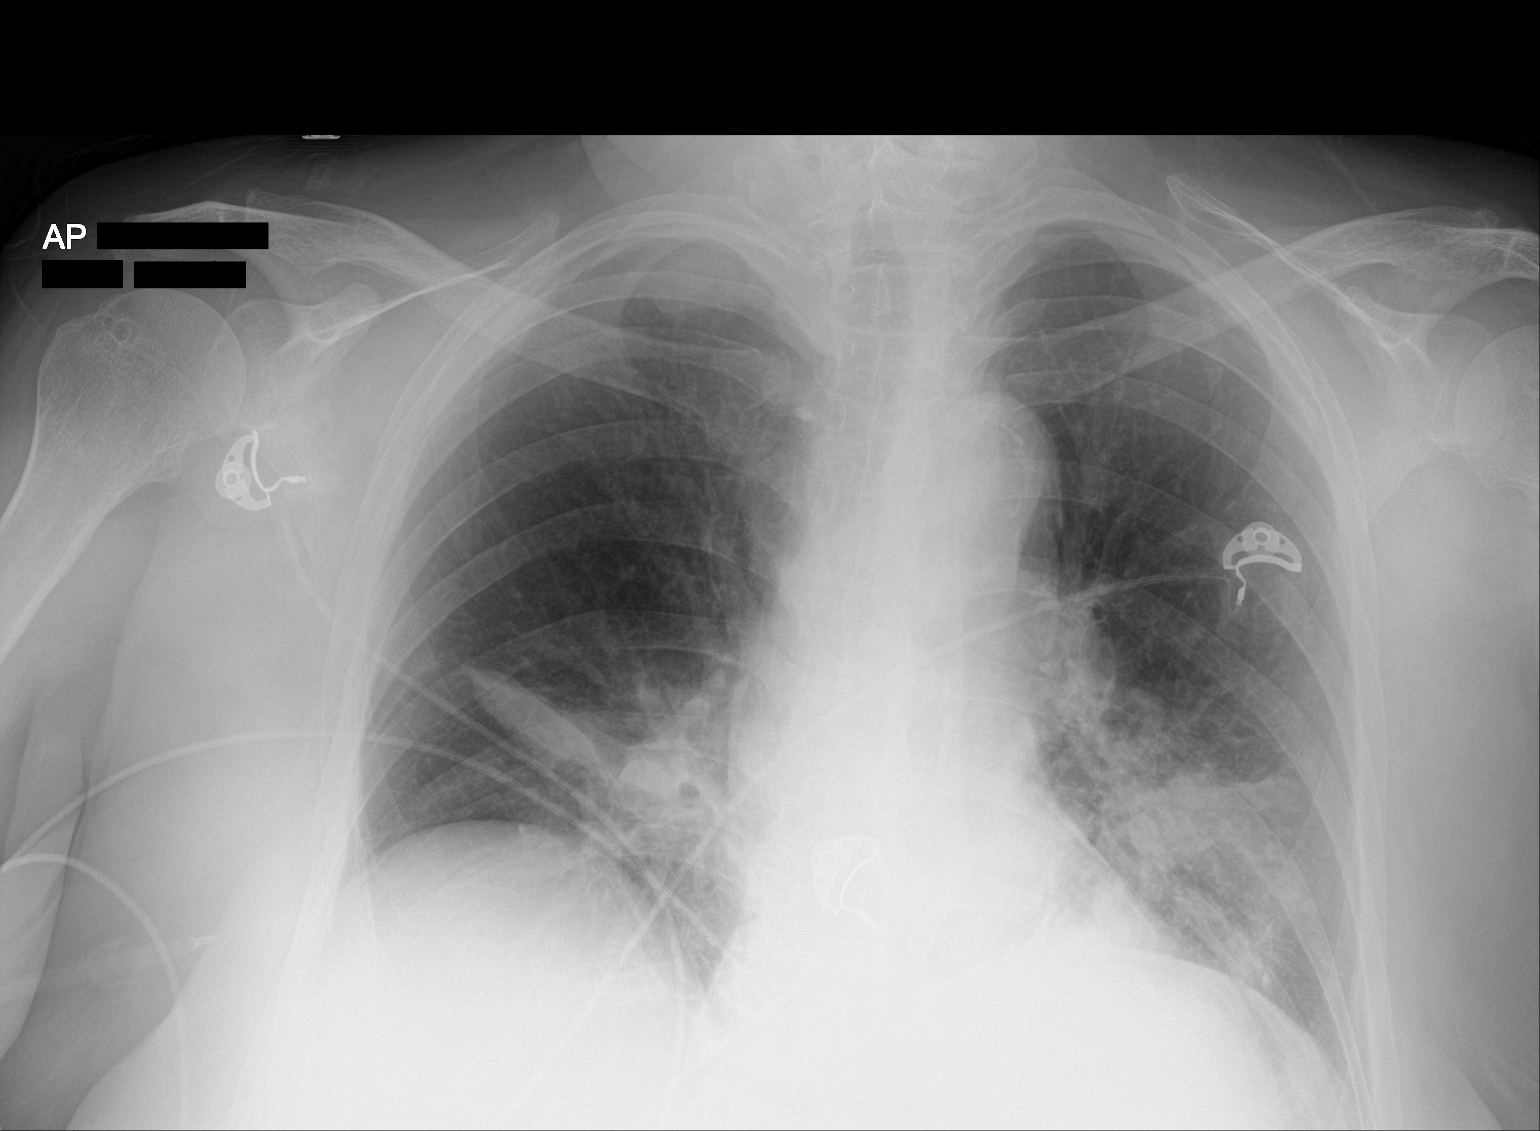

[1 of 1 positions shown; findings below may reference images not displayed]

FINDINGS: The heart size is stable. Worsening bilateral lower lobe pneumonia,
left greater than right. There is suggestion potential component of
cavitation in the left lower lobe airspace process but this is not
very well-defined on the portable chest x-ray. No edema,
pneumothorax or pleural fluid identified.
IMPRESSION: Worsening bilateral lower lobe pneumonia, left greater than right.
There is suggestion of potential component of cavitation in the left
lower lobe airspace process.
